# Patient Record
Sex: Female | Born: 1935 | Race: White | Hispanic: No | Smoking: Never smoker
Health system: Southern US, Community
[De-identification: ages and names within clinical notes are randomized; demographics above are authoritative.]

## PROBLEM LIST (undated history)

## (undated) DIAGNOSIS — E039 Hypothyroidism, unspecified: Secondary | ICD-10-CM

## (undated) DIAGNOSIS — Z9889 Other specified postprocedural states: Secondary | ICD-10-CM

## (undated) DIAGNOSIS — I10 Essential (primary) hypertension: Secondary | ICD-10-CM

## (undated) DIAGNOSIS — D649 Anemia, unspecified: Secondary | ICD-10-CM

## (undated) DIAGNOSIS — E079 Disorder of thyroid, unspecified: Secondary | ICD-10-CM

## (undated) DIAGNOSIS — I8390 Asymptomatic varicose veins of unspecified lower extremity: Secondary | ICD-10-CM

## (undated) DIAGNOSIS — R112 Nausea with vomiting, unspecified: Secondary | ICD-10-CM

## (undated) DIAGNOSIS — H269 Unspecified cataract: Secondary | ICD-10-CM

## (undated) HISTORY — DX: Anemia, unspecified: D64.9

## (undated) HISTORY — DX: Asymptomatic varicose veins of unspecified lower extremity: I83.90

## (undated) HISTORY — DX: Essential (primary) hypertension: I10

## (undated) HISTORY — PX: HEMORROIDECTOMY: SUR656

## (undated) HISTORY — DX: Unspecified cataract: H26.9

## (undated) HISTORY — DX: Disorder of thyroid, unspecified: E07.9

---

## 1969-07-19 HISTORY — PX: VARICOSE VEIN SURGERY: SHX832

## 1973-07-19 HISTORY — PX: APPENDECTOMY: SHX54

## 1973-07-19 HISTORY — PX: ABDOMINAL HYSTERECTOMY: SHX81

## 2002-06-15 ENCOUNTER — Encounter: Payer: Self-pay | Admitting: Family Medicine

## 2002-06-15 ENCOUNTER — Encounter: Admission: RE | Admit: 2002-06-15 | Discharge: 2002-06-15 | Payer: Self-pay | Admitting: Family Medicine

## 2004-05-28 ENCOUNTER — Ambulatory Visit: Payer: Self-pay | Admitting: Internal Medicine

## 2005-11-11 ENCOUNTER — Ambulatory Visit: Payer: Self-pay | Admitting: Cardiology

## 2005-11-11 ENCOUNTER — Inpatient Hospital Stay (HOSPITAL_COMMUNITY): Admission: EM | Admit: 2005-11-11 | Discharge: 2005-11-11 | Payer: Self-pay | Admitting: Family Medicine

## 2005-11-11 ENCOUNTER — Ambulatory Visit: Payer: Self-pay | Admitting: Internal Medicine

## 2005-12-06 ENCOUNTER — Ambulatory Visit: Payer: Self-pay | Admitting: Family Medicine

## 2005-12-28 ENCOUNTER — Ambulatory Visit: Payer: Self-pay | Admitting: Family Medicine

## 2007-01-02 ENCOUNTER — Ambulatory Visit: Payer: Self-pay | Admitting: Family Medicine

## 2007-01-02 LAB — CONVERTED CEMR LAB
ALT: 24 units/L (ref 0–40)
AST: 29 units/L (ref 0–37)
Albumin: 4 g/dL (ref 3.5–5.2)
Alkaline Phosphatase: 59 units/L (ref 39–117)
BUN: 11 mg/dL (ref 6–23)
Basophils Absolute: 0 10*3/uL (ref 0.0–0.1)
Basophils Relative: 0.1 % (ref 0.0–1.0)
Bilirubin, Direct: 0.1 mg/dL (ref 0.0–0.3)
CO2: 29 meq/L (ref 19–32)
Calcium: 9.4 mg/dL (ref 8.4–10.5)
Chloride: 110 meq/L (ref 96–112)
Cholesterol: 186 mg/dL (ref 0–200)
Creatinine, Ser: 0.9 mg/dL (ref 0.4–1.2)
Eosinophils Absolute: 0 10*3/uL (ref 0.0–0.6)
Eosinophils Relative: 1.1 % (ref 0.0–5.0)
GFR calc Af Amer: 80 mL/min
GFR calc non Af Amer: 66 mL/min
Glucose, Bld: 94 mg/dL (ref 70–99)
HCT: 36.2 % (ref 36.0–46.0)
HDL: 52.8 mg/dL (ref >39.0)
Hemoglobin: 12.5 g/dL (ref 12.0–15.0)
LDL Cholesterol: 114 mg/dL — ABNORMAL HIGH (ref 0–99)
Lymphocytes Relative: 39.4 % (ref 12.0–46.0)
MCHC: 34.7 g/dL (ref 30.0–36.0)
MCV: 89.5 fL (ref 78.0–100.0)
Monocytes Absolute: 0.5 10*3/uL (ref 0.2–0.7)
Monocytes Relative: 11.4 % — ABNORMAL HIGH (ref 3.0–11.0)
Neutro Abs: 2.1 10*3/uL (ref 1.4–7.7)
Neutrophils Relative %: 48 % (ref 43.0–77.0)
Platelets: 172 10*3/uL (ref 150–400)
Potassium: 4.1 meq/L (ref 3.5–5.1)
RBC: 4.04 M/uL (ref 3.87–5.11)
RDW: 12.4 % (ref 11.5–14.6)
Sodium: 142 meq/L (ref 135–145)
TSH: 8.39 microintl units/mL — ABNORMAL HIGH (ref 0.35–5.50)
Total Bilirubin: 0.8 mg/dL (ref 0.3–1.2)
Total CHOL/HDL Ratio: 3.5
Total Protein: 8.1 g/dL (ref 6.0–8.3)
Triglycerides: 97 mg/dL (ref 0–149)
VLDL: 19 mg/dL (ref 0–40)
WBC: 4.3 10*3/uL — ABNORMAL LOW (ref 4.5–10.5)

## 2007-02-14 ENCOUNTER — Ambulatory Visit: Payer: Self-pay | Admitting: Family Medicine

## 2007-02-16 DIAGNOSIS — E039 Hypothyroidism, unspecified: Secondary | ICD-10-CM | POA: Insufficient documentation

## 2007-02-16 LAB — CONVERTED CEMR LAB: TSH: 1.3 microintl units/mL (ref 0.35–5.50)

## 2007-03-28 DIAGNOSIS — M8580 Other specified disorders of bone density and structure, unspecified site: Secondary | ICD-10-CM | POA: Insufficient documentation

## 2008-01-02 ENCOUNTER — Encounter: Payer: Self-pay | Admitting: Family Medicine

## 2008-01-03 ENCOUNTER — Ambulatory Visit: Payer: Self-pay | Admitting: Family Medicine

## 2008-01-03 DIAGNOSIS — I10 Essential (primary) hypertension: Secondary | ICD-10-CM | POA: Insufficient documentation

## 2008-01-03 LAB — CONVERTED CEMR LAB
ALT: 17 units/L (ref 0–35)
AST: 19 units/L (ref 0–37)
Albumin: 4 g/dL (ref 3.5–5.2)
Alkaline Phosphatase: 56 units/L (ref 39–117)
BUN: 10 mg/dL (ref 6–23)
Basophils Relative: 0 % (ref 0.0–1.0)
CO2: 28 meq/L (ref 19–32)
Chloride: 109 meq/L (ref 96–112)
Creatinine, Ser: 1 mg/dL (ref 0.4–1.2)
Eosinophils Absolute: 0.1 10*3/uL (ref 0.0–0.7)
Eosinophils Relative: 1.2 % (ref 0.0–5.0)
GFR calc non Af Amer: 58 mL/min
Glucose, Bld: 91 mg/dL (ref 70–99)
MCV: 91.5 fL (ref 78.0–100.0)
Monocytes Relative: 10.9 % (ref 3.0–12.0)
Neutrophils Relative %: 47.9 % (ref 43.0–77.0)
Platelets: 161 10*3/uL (ref 150–400)
Potassium: 4.3 meq/L (ref 3.5–5.1)
RBC: 3.85 M/uL — ABNORMAL LOW (ref 3.87–5.11)
TSH: 4.73 microintl units/mL (ref 0.35–5.50)
Total CHOL/HDL Ratio: 3.6
Total Protein: 7.9 g/dL (ref 6.0–8.3)
VLDL: 22 mg/dL (ref 0–40)
WBC: 4.4 10*3/uL — ABNORMAL LOW (ref 4.5–10.5)

## 2008-01-05 ENCOUNTER — Emergency Department (HOSPITAL_COMMUNITY): Admission: EM | Admit: 2008-01-05 | Discharge: 2008-01-05 | Payer: Self-pay | Admitting: Family Medicine

## 2008-01-05 ENCOUNTER — Telehealth: Payer: Self-pay | Admitting: *Deleted

## 2008-04-26 ENCOUNTER — Ambulatory Visit: Payer: Self-pay | Admitting: Family Medicine

## 2008-05-15 ENCOUNTER — Emergency Department (HOSPITAL_COMMUNITY): Admission: EM | Admit: 2008-05-15 | Discharge: 2008-05-15 | Payer: Self-pay | Admitting: Family Medicine

## 2009-01-09 ENCOUNTER — Ambulatory Visit: Payer: Self-pay | Admitting: Family Medicine

## 2009-01-14 ENCOUNTER — Ambulatory Visit: Payer: Self-pay | Admitting: Family Medicine

## 2009-01-14 LAB — CONVERTED CEMR LAB
Beta Globulin: 4.7 % (ref 4.7–7.2)
Gamma Globulin: 27 % — ABNORMAL HIGH (ref 11.1–18.8)

## 2009-05-07 ENCOUNTER — Encounter (INDEPENDENT_AMBULATORY_CARE_PROVIDER_SITE_OTHER): Payer: Self-pay | Admitting: *Deleted

## 2009-07-28 ENCOUNTER — Telehealth: Payer: Self-pay | Admitting: Family Medicine

## 2009-11-24 ENCOUNTER — Telehealth: Payer: Self-pay | Admitting: Family Medicine

## 2010-02-17 ENCOUNTER — Telehealth: Payer: Self-pay | Admitting: Family Medicine

## 2010-03-02 ENCOUNTER — Ambulatory Visit: Payer: Self-pay | Admitting: Family Medicine

## 2010-05-21 LAB — HM MAMMOGRAPHY: HM Mammogram: NORMAL

## 2010-05-22 ENCOUNTER — Encounter: Payer: Self-pay | Admitting: Family Medicine

## 2010-08-16 LAB — CONVERTED CEMR LAB
ALT: 18 units/L (ref 0–35)
ALT: 19 units/L (ref 0–35)
Albumin: 4.2 g/dL (ref 3.5–5.2)
Alkaline Phosphatase: 60 units/L (ref 39–117)
BUN: 10 mg/dL (ref 6–23)
Basophils Relative: 0.1 % (ref 0.0–3.0)
Basophils Relative: 0.4 % (ref 0.0–3.0)
Bilirubin Urine: NEGATIVE
Bilirubin, Direct: 0.2 mg/dL (ref 0.0–0.3)
CO2: 25 meq/L (ref 19–32)
CO2: 26 meq/L (ref 19–32)
Calcium: 9.4 mg/dL (ref 8.4–10.5)
Chloride: 100 meq/L (ref 96–112)
Chloride: 101 meq/L (ref 96–112)
Cholesterol: 165 mg/dL (ref 0–200)
Creatinine, Ser: 0.9 mg/dL (ref 0.4–1.2)
Eosinophils Relative: 0.7 % (ref 0.0–5.0)
Eosinophils Relative: 2.2 % (ref 0.0–5.0)
Glucose, Bld: 88 mg/dL (ref 70–99)
Glucose, Urine, Semiquant: NEGATIVE
HCT: 36 % (ref 36.0–46.0)
Hemoglobin: 12.3 g/dL (ref 12.0–15.0)
Ketones, urine, test strip: NEGATIVE
LDL Cholesterol: 90 mg/dL (ref 0–99)
LDL Cholesterol: 97 mg/dL (ref 0–99)
Lymphocytes Relative: 35.3 % (ref 12.0–46.0)
Lymphs Abs: 1.5 10*3/uL (ref 0.7–4.0)
MCHC: 34.8 g/dL (ref 30.0–36.0)
MCV: 89.1 fL (ref 78.0–100.0)
MCV: 91.1 fL (ref 78.0–100.0)
Monocytes Absolute: 0.4 10*3/uL (ref 0.1–1.0)
Neutro Abs: 2.6 10*3/uL (ref 1.4–7.7)
Neutrophils Relative %: 54.2 % (ref 43.0–77.0)
Nitrite: POSITIVE
Platelets: 180 10*3/uL (ref 150.0–400.0)
Potassium: 4.1 meq/L (ref 3.5–5.1)
RBC: 3.87 M/uL (ref 3.87–5.11)
Sodium: 136 meq/L (ref 135–145)
Specific Gravity, Urine: <1.005
Specific Gravity, Urine: <1.005
TSH: 3.5 microintl units/mL (ref 0.35–5.50)
Total Bilirubin: 0.9 mg/dL (ref 0.3–1.2)
Total CHOL/HDL Ratio: 3
Total Protein: 7.9 g/dL (ref 6.0–8.3)
Total Protein: 8.7 g/dL — ABNORMAL HIGH (ref 6.0–8.3)
Triglycerides: 83 mg/dL (ref 0.0–149.0)
Urobilinogen, UA: 0.2
WBC: 4.2 10*3/uL — ABNORMAL LOW (ref 4.5–10.5)
WBC: 4.8 10*3/uL (ref 4.5–10.5)
pH: 5

## 2010-08-18 NOTE — Progress Notes (Signed)
Summary: REFILL REQUEST  Phone Note Refill Request Message from:  Patient on February 17, 2010 8:55 AM  Refills Requested: Medication #1:  SYNTHROID 50 MCG  TABS Take 1 tablet by mouth every morning   Notes: [facility] [address], [place], [place]    Initial call taken by: Debbra Riding,  February 17, 2010 8:55 AM    Prescriptions: SYNTHROID 50 MCG  TABS (LEVOTHYROXINE SODIUM) Take 1 tablet by mouth every morning  #30 x 0   Entered by:   Kern Reap CMA (AAMA)   Authorized by:   Roderick Pee MD   Signed by:   Kern Reap CMA (AAMA) on 02/17/2010   Method used:   Electronically to        [facility] [phone]* (retail)       [address]       [place], [place]  [address]       Ph: [phone]       Fax: [phone]   RxID:   [id number]

## 2010-08-18 NOTE — Progress Notes (Signed)
Summary: pt is req cream call into pharm  Phone Note Call from Patient Call back at Home Phone [phone]   Caller: Patient Call For: Roderick Pee MD Summary of Call: Pt is requesting vaginal cream for vaginal dryness [facility] [phone]. Initial call taken by: Heron Sabins,  July 28, 2009 10:40 AM  Follow-up for Phone Call        Premarin vaginal cream dispensed.  Three tubes directions apply twice weekly, refills x 6........... no applicator Follow-up by: Roderick Pee MD,  July 28, 2009 10:59 AM    New/Updated Medications: PREMARIN 0.625 MG/GM CREA (ESTROGENS, CONJUGATED) apply 0.5mg  2 times a week Prescriptions: PREMARIN 0.625 MG/GM CREA (ESTROGENS, CONJUGATED) apply 0.5mg  2 times a week  #3 x 6   Entered by:   Kern Reap CMA (AAMA)   Authorized by:   Roderick Pee MD   Signed by:   Kern Reap CMA (AAMA) on 07/28/2009   Method used:   Electronically to        [facility] [phone]* (retail)       [address]       [place], [place]  [address]       Ph: [phone]       Fax: [phone]   RxID:   [id number]

## 2010-08-18 NOTE — Progress Notes (Signed)
Summary: synthroid refill  Phone Note Refill Request Message from:  Fax from Pharmacy on Nov 24, 2009 5:11 PM  Refills Requested: Medication #1:  SYNTHROID 50 MCG  TABS Take 1 tablet by mouth every morning Initial call taken by: Kern Reap CMA Duncan Dull),  Nov 24, 2009 5:11 PM    Prescriptions: SYNTHROID 50 MCG  TABS (LEVOTHYROXINE SODIUM) Take 1 tablet by mouth every morning  #100 Tablet x 0   Entered by:   Kern Reap CMA (AAMA)   Authorized by:   Roderick Pee MD   Signed by:   Kern Reap CMA (AAMA) on 11/24/2009   Method used:   Electronically to        [facility] [phone]* (retail)       [address]       [place], [place]  [address]       Ph: [phone]       Fax: [phone]   RxID:   [id number]

## 2010-08-18 NOTE — Miscellaneous (Signed)
Summary: mammogram update   Clinical Lists Changes  Observations: Added new observation of MAMMO DUE: 05/2011 (05/22/2010 9:54) Added new observation of MAMMOGRAM: normal (05/21/2010 9:54)      Preventive Care Screening  Mammogram:    Date:  05/21/2010    Next Due:  05/2011    Results:  normal

## 2010-08-18 NOTE — Assessment & Plan Note (Signed)
Summary: CPX // RS   Vital Signs:  Patient profile:   75 year old female Menstrual status:  hysterectomy Height:      67.5 inches Weight:      164 pounds BMI:     25.40 Temp:     97.8 degrees F oral Pulse rate:   68 / minute BP sitting:   138 / 90  (left arm) Cuff size:   regular  Vitals Entered By: Kern Reap CMA Duncan Dull) (March 02, 2010 9:36 AM) CC: annual check up Is Patient Diabetic? No Pain Assessment Patient in pain? no        CC:  annual check up.  History of Present Illness: Anisah is a 75 year old, married female, nonsmoker, who comes in today for physical evaluation because of underlying osteoporosis, hypertension, and hypothyroidism.  She's been on Fosamax for more than 3 years.  We will take her off the Fosamax and get a bone density in two years.  She takes Synthroid 50 micrograms daily.  Will check thyroid level today.  She also takes Zestril 10 mg daily and an 81-mg baby aspirin.  BP at home 120/80.  She gets routine eye care, dental care, BSE monthly, annual mammography, colonoscopy, 2005, normal, tetanus, 2009, seasonal flu 2010, Pneumovax 2007, shingles 2009. Here for Medicare AWV:  1.   Risk factors based on Past M, S, F history:......reviewed no change 2.   Physical Activities: walks daily 3.   Depression/mood: mood good.  No depression 4.   Hearing: normal 5.   ADL's: normal 6.   Fall Risk: reviewed 7.   Home Safety: reviewed 8.   Height, weight, &visual acuity:height weight, unchanged.  Annual eye exam by ophthalmologist.  Normal 9.   Counseling: ........continue current medication and exercise program 10.   Labs ordered based on risk factors: ...done today 11.           Referral Coordination...Marland KitchenMarland KitchenMarland Kitchennone indicated 12.           Care Plan.........Marland Kitchenreviewed in detail 13.            Cognitive Assessment ........normal mentation  Allergies: 1)  ! Septra 2)  Codeine 3)  Sulfamethoxazole (Sulfamethoxazole)  Past History:  Past medical,  surgical, family and social histories (including risk factors) reviewed, and no changes noted (except as noted below).  Past Medical History: Reviewed history from 01/03/2008 and no changes required. Osteoporosis Varicose Veins Hypertension Hypothyroidism  Past Surgical History: Reviewed history from 03/28/2007 and no changes required. CB x1 Colonoscopy  Family History: Reviewed history from 03/28/2007 and no changes required. Family History Diabetes 1st degree relative Family History Other cancer - Bone Family History of Stroke M 1st degree relative <50 Family History Breast cancer 1st degree relative <50  Social History: Reviewed history from 03/28/2007 and no changes required. Retired Married Never Smoked Alcohol use-no Drug use-no Regular exercise-no  Review of Systems      See HPI  Physical Exam  General:  Well-developed,well-nourished,in no acute distress; alert,appropriate and cooperative throughout examination Head:  Normocephalic and atraumatic without obvious abnormalities. No apparent alopecia or balding. Eyes:  No corneal or conjunctival inflammation noted. EOMI. Perrla. Funduscopic exam benign, without hemorrhages, exudates or papilledema. Vision grossly normal. Ears:  External ear exam shows no significant lesions or deformities.  Otoscopic examination reveals clear canals, tympanic membranes are intact bilaterally without bulging, retraction, inflammation or discharge. Hearing is grossly normal bilaterally. Nose:  External nasal examination shows no deformity or inflammation. Nasal mucosa are pink and moist without  lesions or exudates. Mouth:  Oral mucosa and oropharynx without lesions or exudates.  Teeth in good repair. Neck:  No deformities, masses, or tenderness noted. Chest Wall:  No deformities, masses, or tenderness noted. Breasts:  No mass, nodules, thickening, tenderness, bulging, retraction, inflamation, nipple discharge or skin changes noted.     Lungs:  Normal respiratory effort, chest expands symmetrically. Lungs are clear to auscultation, no crackles or wheezes. Heart:  Normal rate and regular rhythm. S1 and S2 normal without gallop, murmur, click, rub or other extra sounds. Abdomen:  Bowel sounds positive,abdomen soft and non-tender without masses, organomegaly or hernias noted. Rectal:  No external abnormalities noted. Normal sphincter tone. No rectal masses or tenderness. Genitalia:  Pelvic Exam:        External: normal female genitalia without lesions or masses        Vagina: normal without lesions or masses        Cervix: normal without lesions or masses        Adnexa: normal bimanual exam without masses or fullness        Uterus: normal by palpation        Pap smear: performed Msk:  No deformity or scoliosis noted of thoracic or lumbar spine.   Pulses:  R and L carotid,radial,femoral,dorsalis pedis and posterior tibial pulses are full and equal bilaterally Extremities:  No clubbing, cyanosis, edema, or deformity noted with normal full range of motion of all joints.   Neurologic:  No cranial nerve deficits noted. Station and gait are normal. Plantar reflexes are down-going bilaterally. DTRs are symmetrical throughout. Sensory, motor and coordinative functions appear intact. Skin:  Intact without suspicious lesions or rashes Cervical Nodes:  No lymphadenopathy noted Axillary Nodes:  No palpable lymphadenopathy Inguinal Nodes:  No significant adenopathy Psych:  Cognition and judgment appear intact. Alert and cooperative with normal attention span and concentration. No apparent delusions, illusions, hallucinations   Impression & Recommendations:  Problem # 1:  HYPERTENSION (ICD-401.9) Assessment Improved  Her updated medication list for this problem includes:    Zestril 10 Mg Tabs (Lisinopril) .Marland Kitchen... Take 1 tablet by mouth every morning  Orders: Venipuncture ([address]109) Prescription Created Electronically [phone]) Medicare  -1st Annual Wellness Visit [phone]) Urinalysis-dipstick only (Medicare patient) (91478GN) EKG w/ Interpretation (93000) TLB-Lipid Panel (80061-LIPID) TLB-BMP (Basic Metabolic Panel-BMET) (80048-METABOL) TLB-CBC Platelet - w/Differential (85025-CBCD) TLB-Hepatic/Liver Function Pnl (80076-HEPATIC) TLB-TSH (Thyroid Stimulating Hormone) (84443-TSH)  Problem # 2:  OSTEOPOROSIS (ICD-733.00) Assessment: Improved  The following medications were removed from the medication list:    Fosamax 70 Mg Tabs (Alendronate sodium) .Marland Kitchen... 1 weekly  Orders: Venipuncture (56213) Prescription Created Electronically [phone]) Medicare -1st Annual Wellness Visit [phone]) Urinalysis-dipstick only (Medicare patient) (29528UX) TLB-Lipid Panel (80061-LIPID) TLB-BMP (Basic Metabolic Panel-BMET) (80048-METABOL) TLB-CBC Platelet - w/Differential (85025-CBCD) TLB-Hepatic/Liver Function Pnl (80076-HEPATIC) TLB-TSH (Thyroid Stimulating Hormone) (84443-TSH)  Problem # 3:  HYPOTHYROIDISM (ICD-244.9) Assessment: Improved  Her updated medication list for this problem includes:    Synthroid 50 Mcg Tabs (Levothyroxine sodium) .Marland Kitchen... Take 1 tablet by mouth every morning  Orders: Venipuncture (32440) Prescription Created Electronically [phone]) Medicare -1st Annual Wellness Visit [phone]) Urinalysis-dipstick only (Medicare patient) (40347QQ) EKG w/ Interpretation (93000) TLB-Lipid Panel (80061-LIPID) TLB-BMP (Basic Metabolic Panel-BMET) (80048-METABOL) TLB-CBC Platelet - w/Differential (85025-CBCD) TLB-Hepatic/Liver Function Pnl (80076-HEPATIC) TLB-TSH (Thyroid Stimulating Hormone) (84443-TSH)  Problem # 4:  Preventive Health Care (ICD-V70.0) Assessment: Unchanged  Complete Medication List: 1)  Zestril 10 Mg Tabs (Lisinopril) .... Take 1 tablet by mouth every morning 2)  Synthroid 50 Mcg Tabs (Levothyroxine  sodium) .... Take 1 tablet by mouth every morning 3)  Aspir-low 81 Mg Tbec (Aspirin) .... Once daily  Other  Orders: Specimen Handling (19147)  Patient Instructions: 1)  Please schedule a follow-up appointment in 1 year. 2)  It is important that you exercise regularly at least 20 minutes 5 times a week. If you develop chest pain, have severe difficulty breathing, or feel very tired , stop exercising immediately and seek medical attention. 3)  Schedule your mammogram. 4)  Schedule a colonoscopy/sigmoidoscopy to help detect colon cancer. 5)  Take calcium +Vitamin D daily. 6)  Take an Aspirin every day. Prescriptions: SYNTHROID 50 MCG  TABS (LEVOTHYROXINE SODIUM) Take 1 tablet by mouth every morning  #100 x 3   Entered and Authorized by:   Roderick Pee MD   Signed by:   Roderick Pee MD on 03/02/2010   Method used:   Electronically to        [facility] [phone]* (retail)       [address]       [place], [place]  [address]       Ph: [phone]       Fax: [phone]   RxID:   [id number] SYNTHROID 50 MCG  TABS (LEVOTHYROXINE SODIUM) Take 1 tablet by mouth every morning  #100 x 3   Entered and Authorized by:   Roderick Pee MD   Signed by:   Roderick Pee MD on 03/02/2010   Method used:   Electronically to        [facility] [phone]* (retail)       [address]       [place], [place]  [address]       Ph: [phone]       Fax: [phone]   RxID:   [id number] ZESTRIL 10 MG  TABS (LISINOPRIL) Take 1 tablet by mouth every morning  #100 Tablet x 3   Entered and Authorized by:   Roderick Pee MD   Signed by:   Roderick Pee MD on 03/02/2010   Method used:   Electronically to        [facility] [phone]* (retail)       [address]       [place], [place]  [address]       Ph: [phone]       Fax: [phone]   RxID:   [id number]    Immunization History:  Influenza Immunization History:    Influenza:  historical (04/18/2009)   Laboratory Results   Urine Tests  Date/Time Received: March 02, 2010   Routine Urinalysis     Color: yellow Appearance: Clear Glucose: negative   (Normal Range: Negative) Bilirubin: negative   (Normal Range: Negative) Ketone: negative   (Normal Range: Negative) Spec. Gravity: <1.005   (Normal Range: 1.003-1.035) Blood: negative   (Normal Range: Negative) pH: 5.0   (Normal Range: 5.0-8.0) Protein: negative   (Normal Range: Negative) Urobilinogen: 0.2   (Normal Range: 0-1) Nitrite: negative   (Normal Range: Negative) Leukocyte Esterace: 2+   (Normal Range: Negative)    Comments: Kathrynn Speed CMA  March 02, 2010 1:14 PM

## 2010-12-04 NOTE — Discharge Summary (Signed)
NAMENIKO, JAKEL NO.:  [id number]   MEDICAL RECORD NO.:  [id number]          PATIENT TYPE:  INP   LOCATION:  5506                         FACILITY:  [facility]   PHYSICIAN:  Rene Paci, M.D. LHCDATE OF BIRTH:  March 13, 1936   DATE OF ADMISSION:  11/10/2005  DATE OF DISCHARGE:  11/11/2005                                 DISCHARGE SUMMARY   DISCHARGE DIAGNOSES:  1.  Chest pain, atypical.  2.  Elevated TSH.  3.  Hypertension.   HISTORY OF PRESENT ILLNESS:  The patient is a 75 year old white female who  was admitted with chief complaint of indigestion.  The patient was admitted  for further evaluation and treatment.   PAST MEDICAL HISTORY:  1.  Hypertension.  2.  History of shingles.   COURSE OF HOSPITALIZATION:  #1 - CHEST PAIN, ATYPICAL.  The patient was  admitted and underwent serial cardiac enzymes which were negative.  Lipid  profile performed which showed a total cholesterol of 167 and LDL of 95.  The patient underwent a stress test which was performed on November 11, 2005,  which per verbal report from cardiology was negative for ischemia and showed  normal LV function.  The patient did have an episode of six beats of  ventricular tachycardia during this admission which was asymptomatic.  Otherwise, she has maintained normal sinus rhythm in 60s to 70s on  telemetry.   #2 - HYPERTENSION.  The patient was placed on an ACE inhibitor and her blood  pressure has improved.  This will be continued at time of discharge and will  need followup as an outpatient.   #3 - ELEVATED TSH.  The patient was noted to have TSH with a value of 6.833.  She has no known history of hypothyroidism.  She will need further  evaluation of thyroid function as an outpatient and may need initiation of  Synthroid.  Will defer to the patient's primary care.   MEDICATIONS AT DISCHARGE:  1.  Fosamax once weekly as before.  2.  Tums four times daily.  3.  Altace 5 mg p.o. daily.  4.   Calcium 500 mg p.o. daily.   PERTINENT LABORATORY DATA:  Cardiac enzymes negative.  BUN 6, creatinine  0.8.  Hemoglobin 12.9, hematocrit 36.9.   FOLLOWUP:  The patient was instructed to follow up with Dr. Kelle Darting in  1-2 weeks and call for an appointment.  She is also instructed to call Dr.  Tawanna Cooler should she develop fever over 101 or worsening chest pain, or go  directly to the emergency room.      Melissa S. Peggyann Juba, NP      Rene Paci, M.D. [facility]  Electronically Signed    MSO/MEDQ  D:  11/11/2005  T:  11/12/2005  Job:  [id number]   cc:   Tinnie Gens A. Tawanna Cooler, M.D. [facility]  [address] [place]  [place] [address]

## 2010-12-04 NOTE — H&P (Signed)
NAMEYING, BLANKENHORN NO.:  [id number]   MEDICAL RECORD NO.:  [id number]          PATIENT TYPE:  INP   LOCATION:  5506                         FACILITY:  [facility]   PHYSICIAN:  Lorain Childes, M.D. LHCDATE OF BIRTH:  October 09, 1935   DATE OF ADMISSION:  11/10/2005  DATE OF DISCHARGE:                                HISTORY & PHYSICAL   PRIMARY CARE PHYSICIAN:  Tinnie Gens A. Tawanna Cooler, M.D.   CHIEF COMPLAINT:  Indigestion.   HISTORY OF PRESENT ILLNESS:  The patient is a 75 year old female with a  history of hypertension, who was admitted through the ER from urgent care  with complaints of indigestion.  The patient reports around 11 a.m. she had  indigestion and she felt a little dizzy.  She ate tomato soup and she had no  change in her symptoms.  She then felt gaseous and needed to burp to relieve  this discomfort.  She burped several times and it improved.  Her discomfort  was rated a 1-2/10 initially and then resolved.  She took an Radio broadcast assistant, which helped her symptoms.  She reports she has never had  problems like this previously.  She does not have reflux disease that is  known.  She does state that during her pain and following it, she did not  have any exertional component of her pain.  She does report mild nausea, no  vomiting, no diaphoresis.  She denies any shortness of breath.  Since this  morning she has had no recurrence of her pain.  She does report that she has  had some lightheadedness today noted mostly when she changed positions.  When going from lying to standing, she felt a little lightheadedness.  It  took her a few minutes to feel better.  She denies any syncopal events.  She  had no falls.   PAST MEDICAL HISTORY:  1.  Hypertension.  2.  History of shingles.   MEDICATIONS:  1.  Fosamax one tablet weekly.  2.  Calcium.  3.  Tums p.r.n.   ALLERGIES:  CODEINE.   SOCIAL HISTORY:  She lives in [place] with her husband.  She denies  any  tobacco, no alcohol, no drugs.   FAMILY HISTORY:  Mother died from cancer.  Father died from diabetes and its  complications.  She has one brother who has Alzheimer's disease and two  sisters, one with hypertension and one with breast cancer.   REVIEW OF SYSTEMS:  She denies any fevers, chills, no headache or visual  changes.  No shortness of breath, dyspnea on exertion, orthopnea, no PND, no  lower extremity edema.  She denies any palpitations, no frank syncope, no  wheezing.  She denies any urinary symptoms of hematuria, no dysuria.  She  denies any bright red blood per rectum, no melena.  She denies abdominal  pain and change in her bowel habits.  All other systems are negative.   PHYSICAL EXAMINATION:  VITAL SIGNS:  Temperature 98.1, pulse 69,  respirations 12, blood pressure 194/100.  Saturations were 96% on room air.  GENERAL:  She is a very pleasant female lying in bed comfortable, in no  acute distress.  HEENT:  Normocephalic, atraumatic.  NECK:  JVP is flat.  She has no bruits.  She has 2+ carotid upstroke.  CARDIOVASCULAR:  Normal S1, split S2, regular rate and rhythm.  She has an  S4.  Her PMI is nondisplaced.  She has a soft systolic murmur noted.  Her  pulses are 2+ throughout.  She has no bruits.  LUNGS:  Clear to auscultation bilaterally.  ABDOMEN:  Soft, positive bowel sounds, nontender, no organomegaly.  EXTREMITIES:  No edema.  NEUROLOGIC:  Nonfocal.   Chest x-ray shows no disease.  EKG:  Rate 66, normal sinus rhythm, normal  axis.  PR interval is 142 msec, QRS is 102 msec, QTC is 454.  She has no  ischemic changes, no hypertrophy.   LABORATORY DATA:  Hematocrit is 36.7, white count 4.9, platelets 181.  Potassium 3.6, creatinine 0.7 and glucose 93.  CK-MB of less than 1.  Repeat  is less than 1.  Troponin is less than 0.05, repeat is less than 0.05.  Myoglobin is 61.3, repeat is 60.2.   ASSESSMENT AND PLAN:  The patient is a 75 year old female with no  prior  cardiac history, who presents with atypical chest pain/epigastric  discomfort, also with hypertension.   1.  Chest pain.  Complaints are atypical.  They sound more like reflux      disease; however, with her age and hypertension, we will evaluate her      for cardiac cause also.  Will place her on telemetry and cycle cardiac      enzymes and follow EKG.  We will check her lipids.  Will continue on      aspirin.  We will start an ACE inhibitor for her blood pressure for now.      As I am planning to do a stress test in the morning, avoid a beta      blocker currently.  For her possible reflux disease, start her on a      proton pump inhibitor tonight.  2.  Lightheadedness.  Will check orthostatics and monitor this.  She does      appear mildly hypovolemic.  I am giving her some normal saline.  3.  Hypertension.  I have started her on an ACE inhibitor for her blood      pressure.  I am avoiding a diuretic, as she appears hypovolemic      currently.  Will monitor blood pressure and adjust her medication as      needed.           ______________________________  Lorain Childes, M.D. [facility]     CGF/MEDQ  D:  11/11/2005  T:  11/11/2005  Job:  [id number]

## 2010-12-24 ENCOUNTER — Other Ambulatory Visit: Payer: Self-pay | Admitting: Family Medicine

## 2011-04-09 ENCOUNTER — Other Ambulatory Visit: Payer: Self-pay | Admitting: Family Medicine

## 2011-04-14 ENCOUNTER — Encounter: Payer: Self-pay | Admitting: Family Medicine

## 2011-04-15 ENCOUNTER — Ambulatory Visit (INDEPENDENT_AMBULATORY_CARE_PROVIDER_SITE_OTHER): Payer: Medicare Other | Admitting: Family Medicine

## 2011-04-15 ENCOUNTER — Encounter: Payer: Self-pay | Admitting: Family Medicine

## 2011-04-15 VITALS — BP 130/84 | Temp 97.8°F | Ht 67.5 in | Wt 170.0 lb

## 2011-04-15 DIAGNOSIS — Z0000 Encounter for general adult medical examination without abnormal findings: Secondary | ICD-10-CM

## 2011-04-15 DIAGNOSIS — I10 Essential (primary) hypertension: Secondary | ICD-10-CM

## 2011-04-15 DIAGNOSIS — E039 Hypothyroidism, unspecified: Secondary | ICD-10-CM

## 2011-04-15 LAB — POCT URINALYSIS DIPSTICK
Bilirubin, UA: NEGATIVE
Ketones, UA: NEGATIVE
Nitrite, UA: NEGATIVE
Protein, UA: NEGATIVE
pH, UA: 5.5

## 2011-04-15 LAB — CBC WITH DIFFERENTIAL/PLATELET
Basophils Relative: 0.3 % (ref 0.0–3.0)
Eosinophils Relative: 2.3 % (ref 0.0–5.0)
HCT: 37.8 % (ref 36.0–46.0)
Hemoglobin: 12.7 g/dL (ref 12.0–15.0)
Lymphs Abs: 1.5 10*3/uL (ref 0.7–4.0)
MCV: 92 fl (ref 78.0–100.0)
Monocytes Absolute: 0.4 10*3/uL (ref 0.1–1.0)
Monocytes Relative: 8.3 % (ref 3.0–12.0)
Platelets: 175 10*3/uL (ref 150.0–400.0)
RBC: 4.11 Mil/uL (ref 3.87–5.11)
WBC: 4.5 10*3/uL (ref 4.5–10.5)

## 2011-04-15 MED ORDER — LEVOTHYROXINE SODIUM 50 MCG PO TABS: 50.0000 ug | ORAL_TABLET | Freq: Every day | ORAL | Status: DC

## 2011-04-15 MED ORDER — LISINOPRIL 10 MG PO TABS: 10.0000 mg | ORAL_TABLET | Freq: Every day | ORAL | Status: DC

## 2011-04-15 NOTE — Progress Notes (Signed)
  Subjective:    Patient ID: Christy Wade, female    DOB: 1936-01-25, 75 y.o.   MRN: [id number]  HPIlula Is a 75 year old, married female, nonsmoker, who comes in today for her Medicare wellness examination because of a history of hypothyroidism and hypertension.  She takes Synthroid 50 mcg daily for hypothyroidism.  She takes lisinopril 10 g daily for hypertension.  BP 130/84.  She gets routine eye care, hearing normal, where her dental care, BSE monthly, and you mammography, colonoscopy, normal, tetanus, 2009, Pneumovax 2007, shingles 2009, seasonal flu shot today.  Activities of daily living.  Normal.  She exercises intense her garden on a regular basis.  She also plays the piano at church.  Cognitive function, normal, home health safety reviewed.  No issues identified, they do have guns in the house, and then not locked.  They do have a healthcare power of attorney and living will.    Review of Systems  Constitutional: Negative.   HENT: Negative.   Eyes: Negative.   Respiratory: Negative.   Cardiovascular: Negative.   Gastrointestinal: Negative.   Genitourinary: Negative.   Musculoskeletal: Negative.   Neurological: Negative.   Hematological: Negative.   Psychiatric/Behavioral: Negative.        Objective:   Physical Exam  Constitutional: She appears well-developed and well-nourished.  HENT:  Head: Normocephalic and atraumatic.  Right Ear: External ear normal.  Left Ear: External ear normal.  Nose: Nose normal.  Mouth/Throat: Oropharynx is clear and moist.  Eyes: EOM are normal. Pupils are equal, round, and reactive to light.  Neck: Normal range of motion. Neck supple. No thyromegaly present.  Cardiovascular: Normal rate, regular rhythm, normal heart sounds and intact distal pulses.  Exam reveals no gallop and no friction rub.   No murmur heard. Pulmonary/Chest: Effort normal and breath sounds normal.  Abdominal: Soft. Bowel sounds are normal. She exhibits no  distension and no mass. There is no tenderness. There is no rebound.  Genitourinary:       Uterus removed years ago for nonmalignant reasons.  Ovaries were left intact.  Asymptomatic therefore pelvic examination not done  Musculoskeletal: Normal range of motion.  Lymphadenopathy:    She has no cervical adenopathy.  Neurological: She is alert. She has normal reflexes. No cranial nerve deficit. She exhibits normal muscle tone. Coordination normal.  Skin: Skin is warm and dry.  Psychiatric: She has a normal mood and affect. Her behavior is normal. Judgment and thought content normal.          Assessment & Plan:  Healthy female.  Hypothyroidism continue Synthroid 50 mcg daily check levels.  Hypertension.  Continue lisinopril 10 mg daily and a baby aspirin.  Check labs.  Follow-up in one year, sooner if any problems

## 2011-04-15 NOTE — Patient Instructions (Signed)
Continue your current medications.  Follow-up in one year, sooner if any problems or

## 2011-04-16 LAB — HEPATIC FUNCTION PANEL
ALT: 17 U/L (ref 0–35)
AST: 24 U/L (ref 0–37)
Total Bilirubin: 0.7 mg/dL (ref 0.3–1.2)
Total Protein: 8.5 g/dL — ABNORMAL HIGH (ref 6.0–8.3)

## 2011-04-16 LAB — BASIC METABOLIC PANEL
BUN: 18 mg/dL (ref 6–23)
Chloride: 103 mEq/L (ref 96–112)
GFR: 57.8 mL/min — ABNORMAL LOW (ref >60.00)
Potassium: 4.4 mEq/L (ref 3.5–5.1)
Sodium: 137 mEq/L (ref 135–145)

## 2011-04-16 LAB — LIPASE: Lipase: 33 U/L (ref 11.0–59.0)

## 2011-04-18 LAB — VITAMIN D 1,25 DIHYDROXY
Vitamin D2 1, 25 (OH)2: <8 pg/mL
Vitamin D3 1, 25 (OH)2: 36 pg/mL

## 2011-06-28 ENCOUNTER — Telehealth: Payer: Self-pay | Admitting: Family Medicine

## 2011-06-28 NOTE — Telephone Encounter (Signed)
Opened in error

## 2011-07-02 ENCOUNTER — Encounter: Payer: Self-pay | Admitting: Family Medicine

## 2011-10-05 ENCOUNTER — Ambulatory Visit (INDEPENDENT_AMBULATORY_CARE_PROVIDER_SITE_OTHER): Payer: Medicare Other | Admitting: Family

## 2011-10-05 ENCOUNTER — Encounter: Payer: Self-pay | Admitting: Family

## 2011-10-05 VITALS — BP 136/80 | HR 102 | Temp 98.0°F | Wt 159.0 lb

## 2011-10-05 DIAGNOSIS — R05 Cough: Secondary | ICD-10-CM

## 2011-10-05 DIAGNOSIS — J209 Acute bronchitis, unspecified: Secondary | ICD-10-CM

## 2011-10-05 DIAGNOSIS — R059 Cough, unspecified: Secondary | ICD-10-CM

## 2011-10-05 MED ORDER — PREDNISONE 20 MG PO TABS: 40.0000 mg | ORAL_TABLET | Freq: Every day | ORAL | Status: AC

## 2011-10-05 NOTE — Progress Notes (Signed)
  Subjective:    Patient ID: Christy Wade, female    DOB: [birth year], 76 y.o.   MRN: [id number]  HPI 76 year old white female, nonsmoker, patient of Dr. Tawanna Cooler is in today with complaints of cough and chest congestion that's been born on for about a week. She was seen in urgent care clinic about a week ago, it was giving a cortisone injection that helped significantly. She is here today requesting another cortisone injection. She denies any fevers, achiness or chills. She also has Lawyer that she hasn't taken any of Mucinex.   Review of Systems  Constitutional: Negative.   HENT: Negative.   Respiratory: Positive for cough, shortness of breath and wheezing.   Cardiovascular: Negative.   Gastrointestinal: Negative.   Musculoskeletal: Negative.   Skin: Negative.   Neurological: Negative.   Hematological: Negative.   Psychiatric/Behavioral: Negative.    Past Medical History  Diagnosis Date  . Osteoporosis   . Varicose vein   . Hypertension   . Thyroid disease     History   Social History  . Marital Status: Married    Spouse Name: N/A    Number of Children: N/A  . Years of Education: N/A   Occupational History  . Not on file.   Social History Main Topics  . Smoking status: Never Smoker   . Smokeless tobacco: Not on file  . Alcohol Use: No  . Drug Use: No  . Sexually Active:    Other Topics Concern  . Not on file   Social History Narrative  . No narrative on file    No past surgical history on file.  Family History  Problem Relation Age of Onset  . Diabetes Other   . Cancer Other     bone, breast  . Stroke Other     Allergies  Allergen Reactions  . Codeine     REACTION: nausea  . Sulfamethoxazole     REACTION: rash  . Sulfamethoxazole W/Trimethoprim     REACTION: hives    Current Outpatient Prescriptions on File Prior to Visit  Medication Sig Dispense Refill  . aspirin 81 MG tablet Take 81 mg by mouth daily as needed.       Marland Kitchen  levothyroxine (SYNTHROID, LEVOTHROID) 50 MCG tablet Take 1 tablet (50 mcg total) by mouth daily.  100 tablet  3  . lisinopril (PRINIVIL,ZESTRIL) 10 MG tablet Take 1 tablet (10 mg total) by mouth daily.  100 tablet  3    BP 136/80  Pulse 102  Temp(Src) 98 F (36.7 C) (Oral)  Wt 159 lb (72.122 kg)  SpO2 96%chart    Objective:   Physical Exam  Constitutional: She is oriented to person, place, and time. She appears well-developed and well-nourished.  HENT:  Right Ear: External ear normal.  Left Ear: External ear normal.  Nose: Nose normal.  Mouth/Throat: Oropharynx is clear and moist.  Neck: Normal range of motion. Neck supple.  Cardiovascular: Normal rate, regular rhythm and normal heart sounds.   Pulmonary/Chest: She has wheezes.  Musculoskeletal: Normal range of motion.  Neurological: She is alert and oriented to person, place, and time.  Skin: Skin is warm and dry.  Psychiatric: She has a normal mood and affect.          Assessment & Plan:  Assessment: Acute bronchitis, cough  Plan: Prednisone 40 mg daily x5 days. Continue Tessalon Perles when necessary. Patient to call the office is symptoms worsen or persist. Recheck as scheduled, and when necessary.

## 2011-10-05 NOTE — Patient Instructions (Signed)

## 2012-05-02 ENCOUNTER — Other Ambulatory Visit: Payer: Self-pay | Admitting: Family Medicine

## 2012-06-21 ENCOUNTER — Ambulatory Visit (INDEPENDENT_AMBULATORY_CARE_PROVIDER_SITE_OTHER): Payer: Medicare Other | Admitting: Family Medicine

## 2012-06-21 ENCOUNTER — Encounter: Payer: Self-pay | Admitting: Family Medicine

## 2012-06-21 VITALS — BP 130/90 | Temp 97.7°F | Ht 67.25 in | Wt 158.0 lb

## 2012-06-21 DIAGNOSIS — M810 Age-related osteoporosis without current pathological fracture: Secondary | ICD-10-CM

## 2012-06-21 DIAGNOSIS — I340 Nonrheumatic mitral (valve) insufficiency: Secondary | ICD-10-CM | POA: Insufficient documentation

## 2012-06-21 DIAGNOSIS — Z0000 Encounter for general adult medical examination without abnormal findings: Secondary | ICD-10-CM

## 2012-06-21 DIAGNOSIS — I10 Essential (primary) hypertension: Secondary | ICD-10-CM

## 2012-06-21 DIAGNOSIS — E039 Hypothyroidism, unspecified: Secondary | ICD-10-CM

## 2012-06-21 DIAGNOSIS — R011 Cardiac murmur, unspecified: Secondary | ICD-10-CM

## 2012-06-21 LAB — BASIC METABOLIC PANEL
BUN: 16 mg/dL (ref 6–23)
Calcium: 9.6 mg/dL (ref 8.4–10.5)
Creatinine, Ser: 1 mg/dL (ref 0.4–1.2)
GFR: 61.12 mL/min (ref >60.00)
Glucose, Bld: 100 mg/dL — ABNORMAL HIGH (ref 70–99)

## 2012-06-21 LAB — POCT URINALYSIS DIPSTICK
Bilirubin, UA: NEGATIVE
Glucose, UA: NEGATIVE
Ketones, UA: NEGATIVE
Spec Grav, UA: <=1.005

## 2012-06-21 LAB — CBC WITH DIFFERENTIAL/PLATELET
Basophils Relative: 0.2 % (ref 0.0–3.0)
Eosinophils Relative: 1.1 % (ref 0.0–5.0)
Hemoglobin: 12.2 g/dL (ref 12.0–15.0)
Lymphocytes Relative: 36.9 % (ref 12.0–46.0)
Neutrophils Relative %: 50.7 % (ref 43.0–77.0)
RBC: 4.05 Mil/uL (ref 3.87–5.11)
WBC: 4.7 10*3/uL (ref 4.5–10.5)

## 2012-06-21 MED ORDER — LEVOTHYROXINE SODIUM 50 MCG PO TABS: ORAL_TABLET | ORAL | Status: DC

## 2012-06-21 NOTE — Patient Instructions (Signed)
Continue your current medications  Continue exercise on a daily basis  Return in one year sooner if any problem

## 2012-06-21 NOTE — Progress Notes (Signed)
  Subjective:    Patient ID: Christy Wade, female    DOB: 08-Nov-1935, 76 y.o.   MRN: [id number]  HPI Christy Wade is a 76 year old married female nonsmoker who comes in today for general Medicare wellness examination because of a history of hypertension hypothyroidism and a mitral valve murmur  Her blood pressure on lisinopril 10 mg a day is 130/90  She's on Synthroid 50 mcg daily will check TSH level today  She is a history of a mitral valve murmur. Echocardiogram 2007 showed some MR. Murmurs not change she's asymptomatic  She gets routine eye care, dental care, BSE monthly, and you mammography, colonoscopy and GI normal, tetanus 2009, Pneumovax 2007, seasonal flu shot 2013, shingles 2009.  Cognitive function normal she walks on a regular basis home health safety reviewed no issues identified, no guns in the house, she does have a health care power of attorney and living will   Review of Systems  Constitutional: Negative.   HENT: Negative.   Eyes: Negative.   Respiratory: Negative.   Cardiovascular: Negative.   Gastrointestinal: Negative.   Genitourinary: Negative.   Musculoskeletal: Negative.   Neurological: Negative.   Hematological: Negative.   Psychiatric/Behavioral: Negative.        Objective:   Physical Exam  Constitutional: She appears well-developed and well-nourished.  HENT:  Head: Normocephalic and atraumatic.  Right Ear: External ear normal.  Left Ear: External ear normal.  Nose: Nose normal.  Mouth/Throat: Oropharynx is clear and moist.  Eyes: EOM are normal. Pupils are equal, round, and reactive to light.  Neck: Normal range of motion. Neck supple. No thyromegaly present.  Cardiovascular: Normal rate, regular rhythm, normal heart sounds and intact distal pulses.  Exam reveals no gallop and no friction rub.   No murmur heard. Pulmonary/Chest: Effort normal and breath sounds normal.  Abdominal: Soft. Bowel sounds are normal. She exhibits no distension and no mass.  There is no tenderness. There is no rebound.  Genitourinary:       Bilateral breast exam normal  She had her uterus removed years ago for nonmalignant reasons asymptomatic therefore pelvic exam not indicated  Musculoskeletal: Normal range of motion.  Lymphadenopathy:    She has no cervical adenopathy.  Neurological: She is alert. She has normal reflexes. No cranial nerve deficit. She exhibits normal muscle tone. Coordination normal.  Skin: Skin is warm and dry.       Total body skin exam shows multiple seborrheic keratosis freckles moles capillary hemangiomas but nothing unusual. She does have 3 venous lesions on her right labia  Psychiatric: She has a normal mood and affect. Her behavior is normal. Judgment and thought content normal.          Assessment & Plan:   healthy female  Hypertension continue lisinopril 10 mg daily  Hypothyroidism continue Synthroid 50 mcg daily  Mitral valve murmur unchanged observe

## 2012-06-27 ENCOUNTER — Other Ambulatory Visit: Payer: Self-pay | Admitting: *Deleted

## 2012-06-27 DIAGNOSIS — I10 Essential (primary) hypertension: Secondary | ICD-10-CM

## 2012-06-27 MED ORDER — LISINOPRIL 10 MG PO TABS: 10.0000 mg | ORAL_TABLET | Freq: Every day | ORAL | Status: DC

## 2013-01-08 ENCOUNTER — Encounter: Payer: Self-pay | Admitting: Family Medicine

## 2013-01-08 ENCOUNTER — Ambulatory Visit (INDEPENDENT_AMBULATORY_CARE_PROVIDER_SITE_OTHER): Payer: Medicare PPO | Admitting: Family Medicine

## 2013-01-08 VITALS — BP 136/86 | Temp 97.6°F | Wt 161.0 lb

## 2013-01-08 DIAGNOSIS — K625 Hemorrhage of anus and rectum: Secondary | ICD-10-CM

## 2013-01-08 MED ORDER — HYDROCORTISONE ACETATE 25 MG RE SUPP: 25.0000 mg | Freq: Two times a day (BID) | RECTAL | Status: DC

## 2013-01-08 NOTE — Patient Instructions (Addendum)
Take a stool softener on a daily basis,,,,,,,,,, milk of magnesia 2 tablespoons twice daily for example  Soak in a hot tub a water at bedtime  Dry then inserted a suppository nightly. Return in 2 weeks for followup

## 2013-01-08 NOTE — Progress Notes (Signed)
  Subjective:    Patient ID: Christy Wade, female    DOB: Dec 12, 1935, 77 y.o.   MRN: [id number]  HPI Christy Wade is a 77 year old married female who comes in today accompanied by her husband because of bright red rectal bleeding  On Friday she developed some chills vomited times once and diarrhea. This lasts about 8-10 hours. She noticed also at that time some bright red rectal bleeding. By Saturday she was fine Sunday she had a normal bowel movement but there was some blood in it. She's had a history of internal and had external hemorrhoids and in the past. She's also had a recent colonoscopy which was normal   Review of Systems    review of systems otherwise negative Objective:   Physical Exam Well-developed well-nourished female no acute distress examination the abdomen the abdomen is soft the bowel sounds are normal there is no palpable masses or tenderness. Examination of the rectum appears normal normal sphincter tone bright red blood on exam no palpable masses       Assessment & Plan:  Bright red rectal bleeding probably secondary to irritation of an internal hemorrhoid plan bowel program followup in

## 2013-01-22 ENCOUNTER — Ambulatory Visit (INDEPENDENT_AMBULATORY_CARE_PROVIDER_SITE_OTHER): Payer: Medicare PPO | Admitting: Family Medicine

## 2013-01-22 ENCOUNTER — Encounter: Payer: Self-pay | Admitting: Family Medicine

## 2013-01-22 VITALS — BP 120/80 | Temp 98.4°F | Wt 159.0 lb

## 2013-01-22 DIAGNOSIS — K625 Hemorrhage of anus and rectum: Secondary | ICD-10-CM

## 2013-01-22 NOTE — Patient Instructions (Signed)
Continue to drink lots of water and take a stool softener for good bowel movements daily  Return if any further bleeding or change in bowel habits for further evaluation

## 2013-01-22 NOTE — Progress Notes (Signed)
  Subjective:    Patient ID: ANIA LEVAY, female    DOB: 08/28/1935, 77 y.o.   MRN: [id number]  HPI Damita Dunnings is a 77 year old female who comes in today for followup of rectal bleeding  We saw a couple weeks ago she had a viral syndrome with diarrhea and prior that had some constipation. She then developed some bright red rectal bleeding. Her physical examination at that time was negative except for some mild to moderate external hemorrhoids which weren't bleeding. Rectal exam at that time was negative however we started our course of therapy with hot soaks and stool softeners and Anusol suppositories for 2 weeks. She comes back today for followup saying she feels well bowel habits are normal no bleeding.   Review of Systems    recent colonoscopy which was negative Objective:   Physical Exam Well-developed well-nourished female no acute distress examination the abdomen is negative rectum shows the hemorrhoids that were previously present are decreased in size. Digital rectal exam shows brown stool guaiac negative no palpable masses       Assessment & Plan:  Rectal bleeding from internal hemorrhoids resolved return when necessary

## 2013-06-26 ENCOUNTER — Other Ambulatory Visit: Payer: Self-pay | Admitting: Family Medicine

## 2013-08-09 ENCOUNTER — Encounter: Payer: Medicare PPO | Admitting: Family Medicine

## 2013-09-20 ENCOUNTER — Ambulatory Visit (INDEPENDENT_AMBULATORY_CARE_PROVIDER_SITE_OTHER): Payer: Medicare PPO | Admitting: Family Medicine

## 2013-09-20 ENCOUNTER — Encounter: Payer: Self-pay | Admitting: Family Medicine

## 2013-09-20 VITALS — BP 120/80 | Temp 97.7°F | Ht 67.75 in | Wt 159.0 lb

## 2013-09-20 DIAGNOSIS — I340 Nonrheumatic mitral (valve) insufficiency: Secondary | ICD-10-CM

## 2013-09-20 DIAGNOSIS — E039 Hypothyroidism, unspecified: Secondary | ICD-10-CM

## 2013-09-20 DIAGNOSIS — M810 Age-related osteoporosis without current pathological fracture: Secondary | ICD-10-CM

## 2013-09-20 DIAGNOSIS — Z0000 Encounter for general adult medical examination without abnormal findings: Secondary | ICD-10-CM

## 2013-09-20 DIAGNOSIS — I10 Essential (primary) hypertension: Secondary | ICD-10-CM

## 2013-09-20 DIAGNOSIS — I059 Rheumatic mitral valve disease, unspecified: Secondary | ICD-10-CM

## 2013-09-20 DIAGNOSIS — Z23 Encounter for immunization: Secondary | ICD-10-CM

## 2013-09-20 LAB — CBC WITH DIFFERENTIAL/PLATELET
BASOS PCT: 0.2 % (ref 0.0–3.0)
Basophils Absolute: 0 10*3/uL (ref 0.0–0.1)
EOS PCT: 2 % (ref 0.0–5.0)
Eosinophils Absolute: 0.1 10*3/uL (ref 0.0–0.7)
HEMATOCRIT: 38.4 % (ref 36.0–46.0)
HEMOGLOBIN: 13.1 g/dL (ref 12.0–15.0)
LYMPHS ABS: 1.8 10*3/uL (ref 0.7–4.0)
Lymphocytes Relative: 37.7 % (ref 12.0–46.0)
MCHC: 34 g/dL (ref 30.0–36.0)
MCV: 90.3 fl (ref 78.0–100.0)
MONO ABS: 0.5 10*3/uL (ref 0.1–1.0)
Monocytes Relative: 10.9 % (ref 3.0–12.0)
NEUTROS ABS: 2.3 10*3/uL (ref 1.4–7.7)
NEUTROS PCT: 49.2 % (ref 43.0–77.0)
Platelets: 182 10*3/uL (ref 150.0–400.0)
RBC: 4.26 Mil/uL (ref 3.87–5.11)
RDW: 13.6 % (ref 11.5–14.6)
WBC: 4.7 10*3/uL (ref 4.5–10.5)

## 2013-09-20 LAB — BASIC METABOLIC PANEL
BUN: 16 mg/dL (ref 6–23)
CHLORIDE: 100 meq/L (ref 96–112)
CO2: 24 mEq/L (ref 19–32)
Calcium: 9.6 mg/dL (ref 8.4–10.5)
Creatinine, Ser: 1 mg/dL (ref 0.4–1.2)
GFR: 56.12 mL/min — ABNORMAL LOW (ref >60.00)
GLUCOSE: 87 mg/dL (ref 70–99)
POTASSIUM: 4.6 meq/L (ref 3.5–5.1)
SODIUM: 132 meq/L — AB (ref 135–145)

## 2013-09-20 LAB — POCT URINALYSIS DIPSTICK
BILIRUBIN UA: NEGATIVE
GLUCOSE UA: NEGATIVE
Ketones, UA: NEGATIVE
NITRITE UA: POSITIVE
Protein, UA: NEGATIVE
Spec Grav, UA: <=1.005
Urobilinogen, UA: 0.2
pH, UA: 5

## 2013-09-20 LAB — HEPATIC FUNCTION PANEL
ALT: 17 U/L (ref 0–35)
AST: 24 U/L (ref 0–37)
Albumin: 4.1 g/dL (ref 3.5–5.2)
Alkaline Phosphatase: 61 U/L (ref 39–117)
BILIRUBIN TOTAL: 1 mg/dL (ref 0.3–1.2)
Bilirubin, Direct: 0.1 mg/dL (ref 0.0–0.3)
Total Protein: 8.1 g/dL (ref 6.0–8.3)

## 2013-09-20 LAB — TSH: TSH: 2.62 u[IU]/mL (ref 0.35–5.50)

## 2013-09-20 MED ORDER — LISINOPRIL 10 MG PO TABS: ORAL_TABLET | ORAL | Status: DC

## 2013-09-20 MED ORDER — LEVOTHYROXINE SODIUM 50 MCG PO TABS: ORAL_TABLET | ORAL | Status: DC

## 2013-09-20 NOTE — Progress Notes (Signed)
Pre visit review using our clinic review tool, if applicable. No additional management support is needed unless otherwise documented below in the visit note. 

## 2013-09-20 NOTE — Patient Instructions (Signed)
Continue your current medications  Return in one year sooner if any problems 

## 2013-09-20 NOTE — Progress Notes (Signed)
   Subjective:    Patient ID: Christy Wade, female    DOB: 17-Oct-1935, 78 y.o.   MRN: [id number]  HPI Christy Wade is a 78 year old married female nonsmoker comes in today for a Medicare wellness examination because of a history of hypothyroidism and hypertension  She takes Synthroid 50 mcg daily labs today  She takes lisinopril 10 mg daily for hypertension BP 120/80  She also takes an aspirin tablet daily. She has a history of mitral valve disease asymptomatic  She gets routine eye care, dental care, recent colonoscopy normal, vaccinations updated by Christy Wade  Cognitive function normal she walks on regular basis home health safety reviewed no issues identified, no guns in the house, she does have a health care power of attorney and living will   Review of Systems  Constitutional: Negative.   HENT: Negative.   Eyes: Negative.   Respiratory: Negative.   Cardiovascular: Negative.   Gastrointestinal: Negative.   Genitourinary: Negative.   Musculoskeletal: Negative.   Neurological: Negative.   Psychiatric/Behavioral: Negative.        Objective:   Physical Exam  Nursing note and vitals reviewed. Constitutional: She appears well-developed and well-nourished.  HENT:  Head: Normocephalic and atraumatic.  Right Ear: External ear normal.  Left Ear: External ear normal.  Nose: Nose normal.  Mouth/Throat: Oropharynx is clear and moist.  Eyes: EOM are normal. Pupils are equal, round, and reactive to light.  Neck: Normal range of motion. Neck supple. No thyromegaly present.  Cardiovascular: Normal rate, regular rhythm, normal heart sounds and intact distal pulses.  Exam reveals no gallop and no friction rub.   No murmur heard. No carotid or at bruits peripheral pulses 2+ and symmetrical  Pulmonary/Chest: Effort normal and breath sounds normal.  Abdominal: Soft. Bowel sounds are normal. She exhibits no distension and no mass. There is no tenderness. There is no rebound.  Genitourinary:    Bilateral breast exam normal  Musculoskeletal: Normal range of motion.  Lymphadenopathy:    She has no cervical adenopathy.  Neurological: She is alert. She has normal reflexes. No cranial nerve deficit. She exhibits normal muscle tone. Coordination normal.  Skin: Skin is warm and dry.  Psychiatric: She has a normal mood and affect. Her behavior is normal. Judgment and thought content normal.   Great two over three systolic ejection murmur heard best at the lower left sternal order consistent with some mild MS       Assessment & Plan:  Healthy female  Hypothyroidism continue Synthroid  Hypertension continue lisinopril  History of mitral valve disease asymptomatic

## 2013-09-21 ENCOUNTER — Telehealth: Payer: Self-pay | Admitting: Family Medicine

## 2013-09-21 NOTE — Telephone Encounter (Signed)
Relevant patient education assigned to patient using Emmi. ° °

## 2014-03-20 ENCOUNTER — Encounter: Payer: Self-pay | Admitting: Internal Medicine

## 2014-09-09 ENCOUNTER — Ambulatory Visit (INDEPENDENT_AMBULATORY_CARE_PROVIDER_SITE_OTHER): Payer: Medicare PPO | Admitting: Family Medicine

## 2014-09-09 ENCOUNTER — Encounter: Payer: Self-pay | Admitting: Family Medicine

## 2014-09-09 VITALS — BP 120/86 | Temp 97.5°F | Wt 158.0 lb

## 2014-09-09 DIAGNOSIS — R0789 Other chest pain: Secondary | ICD-10-CM

## 2014-09-09 DIAGNOSIS — G8929 Other chronic pain: Secondary | ICD-10-CM | POA: Insufficient documentation

## 2014-09-09 MED ORDER — CYCLOBENZAPRINE HCL 5 MG PO TABS: ORAL_TABLET | ORAL | Status: DC

## 2014-09-09 MED ORDER — TRAMADOL HCL 50 MG PO TABS: ORAL_TABLET | ORAL | Status: DC

## 2014-09-09 NOTE — Progress Notes (Signed)
   Subjective:    Patient ID: Christy Wade, female    DOB: 07-18-1936, 79 y.o.   MRN: [id number]  HPI  Christy Wade is a 79 year old female married nonsmoker who comes in today coming by her sister [name] for evaluation of back pain  She's had right subscapular back pain off-and-on for the past 12 years. She's had numerous x-ray studies all of which have been normal. Sunday she played the piano chin church and then developed severe pain from bending over. She went home. Her sister came to see her. [name] is a Marine scientist. [name] appropriately evaluate her did not think she had a life-threatening emergency and treated her symptomatically with Flexeril and tramadol and she improved markedly.  Review of Systems Review of systems otherwise negative no shortness of breath    Objective:   Physical Exam  Well-developed well-nourished female no acute distress vital signs stable she's afebrile cardiopulmonary exam normal      Assessment & Plan:  Chest wall pain,,,,,,,,,,,, treat symptomatically with Motrin Flexeril and tramadol,

## 2014-09-09 NOTE — Progress Notes (Signed)
Pre visit review using our clinic review tool, if applicable. No additional management support is needed unless otherwise documented below in the visit note. 

## 2014-09-09 NOTE — Patient Instructions (Signed)
Tramadol 50 mg.......Marland Kitchen 1/2-1 tablet 3 times a day for severe pain  Flexeril 5 mg......Marland Kitchen 1 tablet 3 times daily  At this juncture since you're feeling so much better I would recommend Motrin 400 mg twice daily with food,,,,,,,, and Flexeril and a half a tramadol bedtime until the pain resolves

## 2014-09-24 ENCOUNTER — Ambulatory Visit: Payer: Medicare PPO | Admitting: Family Medicine

## 2014-09-24 ENCOUNTER — Ambulatory Visit (INDEPENDENT_AMBULATORY_CARE_PROVIDER_SITE_OTHER): Payer: Medicare PPO | Admitting: Family Medicine

## 2014-09-24 ENCOUNTER — Telehealth: Payer: Self-pay | Admitting: Family Medicine

## 2014-09-24 VITALS — BP 130/84 | Temp 97.9°F | Ht 67.5 in | Wt 158.0 lb

## 2014-09-24 DIAGNOSIS — M810 Age-related osteoporosis without current pathological fracture: Secondary | ICD-10-CM

## 2014-09-24 DIAGNOSIS — E039 Hypothyroidism, unspecified: Secondary | ICD-10-CM

## 2014-09-24 DIAGNOSIS — I340 Nonrheumatic mitral (valve) insufficiency: Secondary | ICD-10-CM

## 2014-09-24 DIAGNOSIS — D509 Iron deficiency anemia, unspecified: Secondary | ICD-10-CM

## 2014-09-24 DIAGNOSIS — I10 Essential (primary) hypertension: Secondary | ICD-10-CM

## 2014-09-24 LAB — POCT URINALYSIS DIPSTICK
BILIRUBIN UA: NEGATIVE
Glucose, UA: NEGATIVE
Ketones, UA: NEGATIVE
Nitrite, UA: NEGATIVE
Protein, UA: NEGATIVE
Urobilinogen, UA: 0.2
pH, UA: 5.5

## 2014-09-24 LAB — CBC WITH DIFFERENTIAL/PLATELET
Basophils Absolute: 0 10*3/uL (ref 0.0–0.1)
Basophils Relative: 0.3 % (ref 0.0–3.0)
EOS ABS: 0.1 10*3/uL (ref 0.0–0.7)
EOS PCT: 0.8 % (ref 0.0–5.0)
HCT: 25.9 % — ABNORMAL LOW (ref 36.0–46.0)
Hemoglobin: 8.5 g/dL — ABNORMAL LOW (ref 12.0–15.0)
Lymphocytes Relative: 18.9 % (ref 12.0–46.0)
Lymphs Abs: 1.3 10*3/uL (ref 0.7–4.0)
MCHC: 32.9 g/dL (ref 30.0–36.0)
MCV: 81.3 fl (ref 78.0–100.0)
MONO ABS: 0.5 10*3/uL (ref 0.1–1.0)
Monocytes Relative: 7.9 % (ref 3.0–12.0)
NEUTROS ABS: 4.9 10*3/uL (ref 1.4–7.7)
NEUTROS PCT: 72.1 % (ref 43.0–77.0)
Platelets: 250 10*3/uL (ref 150.0–400.0)
RBC: 3.19 Mil/uL — ABNORMAL LOW (ref 3.87–5.11)
RDW: 13.7 % (ref 11.5–15.5)
WBC: 6.8 10*3/uL (ref 4.0–10.5)

## 2014-09-24 LAB — LIPID PANEL
CHOL/HDL RATIO: 3
Cholesterol: 155 mg/dL (ref 0–200)
HDL: 55.6 mg/dL (ref >39.00)
LDL Cholesterol: 80 mg/dL (ref 0–99)
NONHDL: 99.4
TRIGLYCERIDES: 97 mg/dL (ref 0.0–149.0)
VLDL: 19.4 mg/dL (ref 0.0–40.0)

## 2014-09-24 LAB — HEPATIC FUNCTION PANEL
ALK PHOS: 59 U/L (ref 39–117)
ALT: 10 U/L (ref 0–35)
AST: 16 U/L (ref 0–37)
Albumin: 3.9 g/dL (ref 3.5–5.2)
BILIRUBIN TOTAL: 0.4 mg/dL (ref 0.2–1.2)
Bilirubin, Direct: 0.1 mg/dL (ref 0.0–0.3)
Total Protein: 7.7 g/dL (ref 6.0–8.3)

## 2014-09-24 LAB — BASIC METABOLIC PANEL
BUN: 13 mg/dL (ref 6–23)
CALCIUM: 9.4 mg/dL (ref 8.4–10.5)
CHLORIDE: 104 meq/L (ref 96–112)
CO2: 25 meq/L (ref 19–32)
Creatinine, Ser: 1.07 mg/dL (ref 0.40–1.20)
GFR: 52.96 mL/min — ABNORMAL LOW (ref >60.00)
GLUCOSE: 95 mg/dL (ref 70–99)
Potassium: 4.5 mEq/L (ref 3.5–5.1)
SODIUM: 134 meq/L — AB (ref 135–145)

## 2014-09-24 LAB — TSH: TSH: 4.74 u[IU]/mL — ABNORMAL HIGH (ref 0.35–4.50)

## 2014-09-24 MED ORDER — LEVOTHYROXINE SODIUM 50 MCG PO TABS: ORAL_TABLET | ORAL | Status: DC

## 2014-09-24 MED ORDER — LISINOPRIL 10 MG PO TABS: ORAL_TABLET | ORAL | Status: DC

## 2014-09-24 NOTE — Addendum Note (Signed)
Addended by: TODD, JEFFREY A on: 09/24/2014 04:31 PM   Modules accepted: Orders, Level of Service

## 2014-09-24 NOTE — Progress Notes (Signed)
Pre visit review using our clinic review tool, if applicable. No additional management support is needed unless otherwise documented below in the visit note. 

## 2014-09-24 NOTE — Patient Instructions (Addendum)
Continue current medications  Labs today  Continue daily exercise calcium and vitamin D  Return in one year sooner if any problems  [employer]....... is our new adult nurse practitioner's  Stop all NSAIDs  Iron one twice daily  Prilosec 20 mg....... one twice daily  We will get you set up for a GI consult ASAP

## 2014-09-24 NOTE — Progress Notes (Signed)
   Subjective:    Patient ID: Christy Wade, female    DOB: 03/19/1936, 79 y.o.   MRN: [id number]  HPI Sheanna is a 79 year old female who comes back today for reevaluation. She was seen this morning with general physical exam it was normal. Labs showed a hemoglobin of 8.5. She never had a history of anemia in the past except when she had dysfunctional uterine bleeding many many years ago. She had her uterus removed since that time her blood count is always been normal. Last hemoglobin year ago was 13. She says she been taken some NSAIDs about 400 mg once or twice a day for the past week but nothing chronic. She says her bowel movements been normal.   Review of Systems    review of systems otherwise negative Objective:   Physical Exam  Well-developed well-nourished female no acute distress vital signs stable she's afebrile rectal exam normal stool brown and guaiac positive      Assessment & Plan:  GI bleed slow leak........... stop all NSAIDs....... although I doubt they're the culprit since she's only been on it for about a week.......Marland Kitchen but that's always possible...Marland KitchenMarland KitchenMarland Kitchen start iron........ check levels....... GI evaluation ASAP

## 2014-09-24 NOTE — Progress Notes (Signed)
   Subjective:    Patient ID: Christy Wade, female    DOB: September 12, 1935, 79 y.o.   MRN: [id number]  HPI Christy Wade is a 79 year old married female nonsmoker who comes in today for general physical examination because of a history of hypertension, hypothyroidism, osteoporosis,  Her med list reviewed its correct.  She gets routine eye care, dental care, BSE monthly, annual mammography, no normal needs colonoscopies.  She had a uterus and ovaries removed many years ago for nonmalignant reasons  Cognitive function normal she walks on a daily basis home health safety reviewed no issues identified, no guns in the house, she does have a healthcare power of attorney and living well  All vaccinations up-to-date   Review of Systems  Constitutional: Negative.   HENT: Negative.   Eyes: Negative.   Respiratory: Negative.   Cardiovascular: Negative.   Gastrointestinal: Negative.   Endocrine: Negative.   Genitourinary: Negative.   Musculoskeletal: Negative.   Skin: Negative.   Allergic/Immunologic: Negative.   Neurological: Negative.   Hematological: Negative.   Psychiatric/Behavioral: Negative.        Objective:   Physical Exam  Constitutional: She appears well-developed and well-nourished.  HENT:  Head: Normocephalic and atraumatic.  Right Ear: External ear normal.  Left Ear: External ear normal.  Nose: Nose normal.  Mouth/Throat: Oropharynx is clear and moist.  Eyes: EOM are normal. Pupils are equal, round, and reactive to light.  Neck: Normal range of motion. Neck supple. No JVD present. No tracheal deviation present. No thyromegaly present.  Cardiovascular: Normal rate, regular rhythm, normal heart sounds and intact distal pulses.  Exam reveals no gallop and no friction rub.   No murmur heard. No carotid aortic bruits peripheral pulses 2+ and symmetrical  Pulmonary/Chest: Effort normal and breath sounds normal. No stridor. No respiratory distress. She has no wheezes. She has no  rales. She exhibits no tenderness.  Abdominal: Soft. Bowel sounds are normal. She exhibits no distension and no mass. There is no tenderness. There is no rebound and no guarding.  Genitourinary:  Uterus and ovaries removed for nonmalignant reasons therefore pelvic exam not indicated  Bilateral breast exam normal  Musculoskeletal: Normal range of motion.  Lymphadenopathy:    She has no cervical adenopathy.  Neurological: She is alert. She has normal reflexes. No cranial nerve deficit. She exhibits normal muscle tone. Coordination normal.  Skin: Skin is warm and dry. No rash noted. No erythema. No pallor.  Total body skin exam normal she has a garden variety of freckles capillary hemangiomas moles seborrheic keratosis. Scar left breast 3:00 position she went to dermatology and had a lesion removed was benign  Psychiatric: She has a normal mood and affect. Her behavior is normal. Judgment and thought content normal.  Nursing note and vitals reviewed.         Assessment & Plan:  Healthy female  Hypertension ago continue current therapy  Hypothyroidism at goal check labs  Osteoporosis continue calcium vitamin D and daily exercise

## 2014-09-25 ENCOUNTER — Ambulatory Visit (INDEPENDENT_AMBULATORY_CARE_PROVIDER_SITE_OTHER): Payer: Medicare PPO | Admitting: Gastroenterology

## 2014-09-25 ENCOUNTER — Telehealth: Payer: Self-pay | Admitting: Internal Medicine

## 2014-09-25 ENCOUNTER — Other Ambulatory Visit: Payer: Self-pay

## 2014-09-25 ENCOUNTER — Encounter: Payer: Self-pay | Admitting: Gastroenterology

## 2014-09-25 VITALS — BP 120/64 | HR 64 | Ht 67.5 in | Wt 158.4 lb

## 2014-09-25 DIAGNOSIS — R195 Other fecal abnormalities: Secondary | ICD-10-CM

## 2014-09-25 DIAGNOSIS — D649 Anemia, unspecified: Secondary | ICD-10-CM

## 2014-09-25 DIAGNOSIS — D500 Iron deficiency anemia secondary to blood loss (chronic): Secondary | ICD-10-CM | POA: Insufficient documentation

## 2014-09-25 NOTE — Telephone Encounter (Signed)
Received an urgent referral for this patient.  She has a hx with Dr. Olevia Perches from 2005.  From the notes it looks like hemoglobin has dropped from 13 to 8.5 and has Heme positive stools.  There are not morning apts with extenders available until next week.  Advise.

## 2014-09-25 NOTE — Progress Notes (Signed)
09/25/2014 Christy Wade [id number] 02/04/1936   HISTORY OF PRESENT ILLNESS:  This is a pleasant 79 year old female who is begin referred here by her PCP, Dr. Sherren Mocha, for anemia and heme positive stool.  She apparently had a colonoscopy in 2005 that she thinks was performed here, but we do not have those records available to [place] at this time.  She had a routine yearly visit with her PCP yesterday and Hgb was found to be 8.5 grams.  The last for comparison was one year ago at which time it was 13.1 grams.  She denies seeing red blood in her stool and says that her stools had been normal until just a couple of days ago when they were much darker in color (says that they were black, but not thick tarry black).  Has only been moving her bowel about every other day.  Was heme positive at PCP's office yesterday.  Was taking an ASA 81 mg, but that is now on hold.  She was also recently taking some Advil since 2/22 for chest wall pain, but that has also now been stopped.  She was started on prilosec 20 mg BID yesterday as well as an iron supplement.  Complains of feeling weak and tired recently, but denies dizziness.  No other GI complaints.     Past Medical History  Diagnosis Date  . Osteoporosis   . Varicose vein   . Hypertension   . Thyroid disease    History reviewed. No pertinent past surgical history.  reports that she has never smoked. She has never used smokeless tobacco. She reports that she does not drink alcohol or use illicit drugs. family history includes Cancer in her other; Diabetes in her other; Stroke in her other. Allergies  Allergen Reactions  . Codeine     REACTION: nausea  . Sulfamethoxazole     REACTION: rash  . Sulfamethoxazole-Trimethoprim     REACTION: hives      Outpatient Encounter Prescriptions as of 09/25/2014  Medication Sig  . calcium carbonate 200 MG capsule Take 250 mg by mouth 2 (two) times daily with a meal.  . cyclobenzaprine (FLEXERIL) 5 MG tablet 1  by mouth daily at bedtime when necessary  . levothyroxine (SYNTHROID, LEVOTHROID) 50 MCG tablet TAKE ONE TABLET BY MOUTH EVERY MORNING  . lisinopril (PRINIVIL,ZESTRIL) 10 MG tablet TAKE ONE TABLET BY MOUTH DAILY  . Multiple Vitamin (MULTIVITAMIN) tablet Take 1 tablet by mouth daily.  . traMADol (ULTRAM) 50 MG tablet 1/2-1 tablet at bedtime for pain  . aspirin 81 MG tablet Take 81 mg by mouth daily as needed.      REVIEW OF SYSTEMS  : All other systems reviewed and negative except where noted in the History of Present Illness.   PHYSICAL EXAM: BP 120/64 mmHg  Pulse 64  Ht 5' 7.5" (1.715 m)  Wt 158 lb 6.4 oz (71.85 kg)  BMI 24.43 kg/m2 General: Well developed white female in no acute distress Head: Normocephalic and atraumatic Eyes:  Sclerae anicteric,conjunctive pink. Ears: Normal auditory acuity Lungs: Clear throughout to auscultation Heart: Regular rate and rhythm Abdomen: Soft, non-distended.  Normal bowel sounds.  Non-tender. Rectal:  Heme positive at PCP's office yesterday. Musculoskeletal: Symmetrical with no gross deformities  Skin: No lesions on visible extremities Extremities: No edema  Neurological: Alert oriented x 4, grossly non-focal Psychological:  Alert and cooperative. Normal mood and affect  ASSESSMENT AND PLAN: -79 year old with Hgb of 8.5 grams compared  to normal 13.1 grams last year and with dark heme positive stool recently.  I do not think that she is actively bleeding or that she necessarily had a large acute GI, but patient needs EGD for evaluation and should have colonoscopy as well.  Rule out ulcer disease, AVM's, etc. Will schedule with Dr. Ardis Hughs as he has the first available to perform a more urgent procedure.  The risks, benefits, and alternatives were discussed with the patient and she consents to proceed.  Will recheck Hgb tomorrow to assess stability.  Agree with prilosec 20 mg BID for now.   CC:  Dorena Cookey, MD

## 2014-09-25 NOTE — Telephone Encounter (Signed)
May use afternoon extender appointment.

## 2014-09-25 NOTE — Telephone Encounter (Signed)
Pt scd with Janett Billow on 3/9 @ 2pm

## 2014-09-25 NOTE — Patient Instructions (Signed)
You will have your EGD and colonoscopy on Monday 09/30/14 at 2:30 pm. You will arrive to the 4th floor of [facility] at 1:30 pm on that day. Please remember to go to the lab tomorrow at any time between 8 and 5. You do not have to fast. The lab is in the basement on the left when you step out of the elevator.

## 2014-09-26 ENCOUNTER — Other Ambulatory Visit (INDEPENDENT_AMBULATORY_CARE_PROVIDER_SITE_OTHER): Payer: Medicare PPO

## 2014-09-26 DIAGNOSIS — D649 Anemia, unspecified: Secondary | ICD-10-CM

## 2014-09-26 DIAGNOSIS — R195 Other fecal abnormalities: Secondary | ICD-10-CM

## 2014-09-26 LAB — HEMOGLOBIN

## 2014-09-26 NOTE — Progress Notes (Signed)
I agree with the above note, plan 

## 2014-09-30 ENCOUNTER — Encounter: Payer: Self-pay | Admitting: Gastroenterology

## 2014-09-30 ENCOUNTER — Ambulatory Visit (AMBULATORY_SURGERY_CENTER): Payer: Medicare PPO | Admitting: Gastroenterology

## 2014-09-30 VITALS — BP 145/73 | HR 76 | Temp 97.0°F | Resp 18 | Ht 67.0 in | Wt 158.0 lb

## 2014-09-30 DIAGNOSIS — D500 Iron deficiency anemia secondary to blood loss (chronic): Secondary | ICD-10-CM

## 2014-09-30 DIAGNOSIS — K2970 Gastritis, unspecified, without bleeding: Secondary | ICD-10-CM

## 2014-09-30 DIAGNOSIS — R195 Other fecal abnormalities: Secondary | ICD-10-CM

## 2014-09-30 DIAGNOSIS — K2990 Gastroduodenitis, unspecified, without bleeding: Secondary | ICD-10-CM

## 2014-09-30 DIAGNOSIS — K2940 Chronic atrophic gastritis without bleeding: Secondary | ICD-10-CM | POA: Diagnosis not present

## 2014-09-30 MED ORDER — SODIUM CHLORIDE 0.9 % IV SOLN: 500.0000 mL | INTRAVENOUS | Status: DC

## 2014-09-30 NOTE — Patient Instructions (Signed)

## 2014-09-30 NOTE — Progress Notes (Signed)
Called to room to assist during endoscopic procedure.  Patient ID and intended procedure confirmed with present staff. Received instructions for my participation in the procedure from the performing physician.  

## 2014-09-30 NOTE — Op Note (Signed)
[facility]  [address]. [place], [id number]   COLONOSCOPY PROCEDURE REPORT  PATIENT: Christy Wade, [name]  MR#: [id number] BIRTHDATE: [birth year] , 78  yrs. old GENDER: female ENDOSCOPIST: Milus Banister, MD REFERRED [id number] Delora Fuel, M.D. PROCEDURE DATE:  09/30/2014 PROCEDURE:   Colonoscopy, diagnostic First Screening Colonoscopy - Avg.  risk and is 50 yrs.  old or older - No.  Prior Negative Screening - Now for repeat screening. 10 or more years since last screening  History of Adenoma - Now for follow-up colonoscopy & has been > or = to 3 yrs.  N/A ASA CLASS:   Class III INDICATIONS:hemocult + stool, signficant new anemia. MEDICATIONS: Monitored anesthesia care and Propofol 200 mg IV  DESCRIPTION OF PROCEDURE:   After the risks benefits and alternatives of the procedure were thoroughly explained, informed consent was obtained.  The digital rectal exam revealed no abnormalities of the rectum.   The LB CF-H180AL Loaner [id number] endoscope was introduced through the anus and advanced to the cecum, which was identified by both the appendix and ileocecal valve. No adverse events experienced.   The quality of the prep was excellent.  The instrument was then slowly withdrawn as the colon was fully examined.   COLON FINDINGS: A normal appearing cecum, ileocecal valve, and appendiceal orifice were identified.  The ascending, transverse, descending, sigmoid colon, and rectum appeared unremarkable. Retroflexed views revealed no abnormalities. The time to cecum = 5.7 Withdrawal time = 6.1   The scope was withdrawn and the procedure completed. COMPLICATIONS: There were no immediate complications.  ENDOSCOPIC IMPRESSION: Normal colonoscopy No polyps or cancers  RECOMMENDATIONS: Will proceed with EGD now  eSigned:  Milus Banister, MD 09/30/2014 2:41 PM

## 2014-09-30 NOTE — Progress Notes (Signed)
Report to PACU, RN, vss, BBS= Clear.  

## 2014-09-30 NOTE — Op Note (Signed)
[facility]  [address] [place], [address]   ENDOSCOPY PROCEDURE REPORT  PATIENT: Christy Wade, Christy Wade  MR#: [id number] BIRTHDATE: December 31, 1935 , 78  yrs. old GENDER: female ENDOSCOPIST: Milus Banister, MD PROCEDURE DATE:  09/30/2014 PROCEDURE:  EGD w/ biopsy ASA CLASS:     Class III INDICATIONS:  hemocult + stool, new signficant anemia. MEDICATIONS: Monitored anesthesia care, Propofol 40 mg IV, and lidocaine 100mg  IV TOPICAL ANESTHETIC: none  DESCRIPTION OF PROCEDURE: After the risks benefits and alternatives of the procedure were thoroughly explained, informed consent was obtained.  The LB [id number] endoscope was introduced through the mouth and advanced to the second portion of the duodenum , Without limitations.  The instrument was slowly withdrawn as the mucosa was fully examined.  There was moderate pan-gastritis.  This was non-specific, biopsied were taken from body and antrum and sent to pathology.  The duodenum was normal, this was biopsied and sent to pathology. There was a 1cm hiatal hernia.  The examination was otherwise normal.  Retroflexed views revealed no abnormalities.     The scope was then withdrawn from the patient and the procedure completed. COMPLICATIONS: There were no immediate complications.  ENDOSCOPIC IMPRESSION: There was moderate pan-gastritis.  This was non-specific, biopsied were taken from body and antrum and sent to pathology.  The duodenum was normal, this was biopsied and sent to pathology. There was a 1cm hiatal hernia.  The examination was otherwise normal  RECOMMENDATIONS: Await final pathology report.  If no clear explanation for her new anemia then will likely set up CT scan of chest/abd/pelvis.  If that is also unhelpful, the next test would be capsule endoscopy.   eSigned:  Milus Banister, MD 09/30/2014 2:45 PM    CC: Christie Nottingham, MD

## 2014-10-01 ENCOUNTER — Telehealth: Payer: Self-pay | Admitting: *Deleted

## 2014-10-01 NOTE — Telephone Encounter (Signed)
  Follow up Call-  Call back number 09/30/2014  Post procedure Call Back phone  # [phone]  Permission to leave phone message Yes     Patient questions:  Do you have a fever, pain , or abdominal swelling? No. Pain Score  0 *  Have you tolerated food without any problems? Yes.    Have you been able to return to your normal activities? Yes.    Do you have any questions about your discharge instructions: Diet   No. Medications  No. Follow up visit  No.  Do you have questions or concerns about your Care? No.  Actions: * If pain score is 4 or above: No action needed, pain <4.

## 2014-10-10 ENCOUNTER — Other Ambulatory Visit: Payer: Self-pay

## 2014-10-10 DIAGNOSIS — D649 Anemia, unspecified: Secondary | ICD-10-CM

## 2014-10-10 NOTE — Progress Notes (Signed)
You have been scheduled for a CT scan of the abdomen and pelvis at [facility] ([address]---this is in the same building as [facility]).   You are scheduled on 10/16/14 at 930 am. You should arrive 15 minutes prior to your appointment time for registration. Please follow the written instructions below on the day of your exam:  WARNING: IF YOU ARE ALLERGIC TO IODINE/X-RAY DYE, PLEASE NOTIFY RADIOLOGY IMMEDIATELY AT [phone]! YOU WILL BE GIVEN A 13 HOUR PREMEDICATION PREP.  1) Do not eat or drink anything after 530 am (4 hours prior to your test) 2) You have been given 2 bottles of oral contrast to drink. The solution may taste  better if refrigerated, but do NOT add ice or any other liquid to this solution. Shake well before drinking.    Drink 1 bottle of contrast @ 730 am (2 hours prior to your exam)  Drink 1 bottle of contrast @ 830 am (1 hour prior to your exam)  You may take any medications as prescribed with a small amount of water except for the following: Metformin, Glucophage, Glucovance, Avandamet, Riomet, Fortamet, Actoplus Met, Janumet, Glumetza or Metaglip. The above medications must be held the day of the exam AND 48 hours after the exam.  The purpose of you drinking the oral contrast is to aid in the visualization of your intestinal tract. The contrast solution may cause some diarrhea. Before your exam is started, you will be given a small amount of fluid to drink. Depending on your individual set of symptoms, you may also receive an intravenous injection of x-ray contrast/dye. Plan on being at [facility] for 30 minutes or longer, depending on the type of exam you are having performed.  This test typically takes 30-45 minutes to complete.  If you have any questions regarding your exam or if you need to reschedule, you may call the CT department at [phone] between the hours of 8:00 am and 5:00 pm,  Monday-Friday.  ________________________________________________________________________ Pt has been instructed and will pick up contrast

## 2014-10-16 ENCOUNTER — Ambulatory Visit
Admission: RE | Admit: 2014-10-16 | Discharge: 2014-10-16 | Disposition: A | Discharge status: Home / Self Care | Payer: Medicare PPO | Source: Ambulatory Visit | Attending: Gastroenterology | Admitting: Gastroenterology

## 2014-10-16 ENCOUNTER — Telehealth: Payer: Self-pay | Admitting: Gastroenterology

## 2014-10-16 ENCOUNTER — Other Ambulatory Visit (INDEPENDENT_AMBULATORY_CARE_PROVIDER_SITE_OTHER): Payer: Medicare PPO

## 2014-10-16 ENCOUNTER — Ambulatory Visit (INDEPENDENT_AMBULATORY_CARE_PROVIDER_SITE_OTHER)
Admission: RE | Admit: 2014-10-16 | Discharge: 2014-10-16 | Disposition: A | Discharge status: Home / Self Care | Payer: Medicare PPO | Source: Ambulatory Visit | Attending: Gastroenterology | Admitting: Gastroenterology

## 2014-10-16 DIAGNOSIS — D649 Anemia, unspecified: Secondary | ICD-10-CM

## 2014-10-16 LAB — CBC WITH DIFFERENTIAL/PLATELET
BASOS ABS: 0 10*3/uL (ref 0.0–0.1)
Basophils Relative: 0.2 % (ref 0.0–3.0)
Eosinophils Absolute: 0 10*3/uL (ref 0.0–0.7)
Eosinophils Relative: 0.6 % (ref 0.0–5.0)
HEMATOCRIT: 29.1 % — AB (ref 36.0–46.0)
Hemoglobin: 9.8 g/dL — ABNORMAL LOW (ref 12.0–15.0)
Lymphocytes Relative: 39.1 % (ref 12.0–46.0)
Lymphs Abs: 2.1 10*3/uL (ref 0.7–4.0)
MCHC: 33.6 g/dL (ref 30.0–36.0)
MCV: 85.1 fl (ref 78.0–100.0)
Monocytes Absolute: 0.5 10*3/uL (ref 0.1–1.0)
Monocytes Relative: 10.3 % (ref 3.0–12.0)
NEUTROS ABS: 2.6 10*3/uL (ref 1.4–7.7)
Neutrophils Relative %: 49.8 % (ref 43.0–77.0)
PLATELETS: 219 10*3/uL (ref 150.0–400.0)
RBC: 3.43 Mil/uL — AB (ref 3.87–5.11)
RDW: 20.9 % — AB (ref 11.5–15.5)
WBC: 5.3 10*3/uL (ref 4.0–10.5)

## 2014-10-16 MED ORDER — IOHEXOL 300 MG/ML  SOLN
100.0000 mL | Freq: Once | INTRAMUSCULAR | Status: AC | PRN
  Administered 2014-10-16: 100 mL via INTRAVENOUS

## 2014-10-16 NOTE — Telephone Encounter (Signed)
Left message on machine to call back  

## 2014-10-16 NOTE — Telephone Encounter (Signed)
Pt walked in wanting a repeat CBC today, I got a verbal ok from [place] for the lab.  Also, Dr Ardis Hughs we got a call report on her CT abd pelvis the chest was denied per her insurance.  I consulted Janett Billow and she stated it was ok to wait for you to address the findings.

## 2014-10-18 NOTE — Telephone Encounter (Signed)
OK, thanks

## 2014-10-22 ENCOUNTER — Encounter: Payer: Self-pay | Admitting: Internal Medicine

## 2014-10-22 ENCOUNTER — Other Ambulatory Visit: Payer: Self-pay

## 2014-10-22 DIAGNOSIS — R933 Abnormal findings on diagnostic imaging of other parts of digestive tract: Secondary | ICD-10-CM

## 2014-11-13 ENCOUNTER — Other Ambulatory Visit: Payer: Self-pay | Admitting: Surgery

## 2014-11-13 NOTE — H&P (Signed)
Christy Wade 11/13/2014 1:31 PM Location: [facility] Patient #: [id number] DOB: 1936-05-07 Married / Language: Cleophus Molt / Race: White Female History of Present Illness Adin Hector MD; 11/13/2014 2:43 PM) Patient words: resection small bowel.  The patient is a 79 year old female who presents with a complaint of mass in intestine. Pleasant patient sent by her gastroenterologist Dr. Oretha Caprice. Concern for GI try loss anemia and mass in the ileum.  Pleasant elderly female. History of hysterectomy but no other surgeries. Was found to be anemic with hemoglobin around 8 on annual exam. Feeling somewhat fatigued. Sent to gastroenterology. Underwhelming upper and lower endoscopies. Mild hiatal hernia and gastritis. No Helicobacter pylori. Colonoscopy completely normal. CT scan showed mass in the mid ileum with thickening and possible lymphadenopathy. Suspicious for small bowel tumor. Surgical consultation requested to see if resection appropriate.  Patient can walk about 20 minutes without difficulty. Has a bowel movement about every day or so. She denies any abdominal pain. She's been on iron and her hemoglobin is now on the upper nines. She is here today with her sister who is a Marine scientist and other family member. No history of Crohn's/Ulcerative colitis. No inflammatory bowel disease. No irritable bowel syndrome. No family history of colon or bowel cancer carcinoid that she is aware of. Other Problems Elbert Ewings, CMA; 11/13/2014 1:31 PM) Hemorrhoids  Past Surgical History Elbert Ewings, CMA; 11/13/2014 1:31 PM) Appendectomy Hysterectomy (not due to cancer) - Partial Knee Surgery Right. Oral Surgery  Diagnostic Studies History Elbert Ewings, [place]; 11/13/2014 1:31 PM) Colonoscopy within last year Mammogram within last year Pap Smear >5 years ago  Allergies Elbert Ewings, CMA; 11/13/2014 1:33 PM) Codeine Phosphate *ANALGESICS - OPIOID* Sulfa 10 *OPHTHALMIC  AGENTS*  Medication History Elbert Ewings, CMA; 11/13/2014 1:34 PM) TraMADol HCl (50MG  Tablet, Oral) Active. Cyclobenzaprine HCl (5MG  Tablet, Oral) Active. Levothyroxine Sodium (50MCG Tablet, Oral) Active. Lisinopril (10MG  Tablet, Oral) Active. Multi-Minerals (Oral) Active. Aspirin (81MG  Tablet, Oral) Active. Medications Reconciled  Social History Elbert Ewings, [place]; 11/13/2014 1:31 PM) No alcohol use No caffeine use No drug use Tobacco use Never smoker.  Family History Elbert Ewings, [place]; 11/13/2014 1:31 PM) Breast Cancer Sister. Cancer Mother. Diabetes Mellitus Father. Thyroid problems Mother.  Pregnancy / Birth History Elbert Ewings, CMA; 11/13/2014 1:31 PM) Age at menarche 45 years. Age of menopause 30-55 Gravida 1 Maternal age 60-30 Para 1     Review of Systems Elbert Ewings CMA; 11/13/2014 1:31 PM) General Present- Fatigue. Not Present- Appetite Loss, Chills, Fever, Night Sweats, Weight Gain and Weight Loss. Skin Not Present- Change in Wart/Mole, Dryness, Hives, Jaundice, New Lesions, Non-Healing Wounds, Rash and Ulcer. HEENT Not Present- Earache, Hearing Loss, Hoarseness, Nose Bleed, Oral Ulcers, Ringing in the Ears, Seasonal Allergies, Sinus Pain, Sore Throat, Visual Disturbances, Wears glasses/contact lenses and Yellow Eyes. Respiratory Not Present- Bloody sputum, Chronic Cough, Difficulty Breathing, Snoring and Wheezing. Breast Not Present- Breast Mass, Breast Pain, Nipple Discharge and Skin Changes. Cardiovascular Not Present- Chest Pain, Difficulty Breathing Lying Down, Leg Cramps, Palpitations, Rapid Heart Rate, Shortness of Breath and Swelling of Extremities. Gastrointestinal Present- Bloody Stool and Hemorrhoids. Not Present- Abdominal Pain, Bloating, Change in Bowel Habits, Chronic diarrhea, Constipation, Difficulty Swallowing, Excessive gas, Gets full quickly at meals, Indigestion, Nausea, Rectal Pain and Vomiting. Female Genitourinary Not Present-  Frequency, Nocturia, Painful Urination, Pelvic Pain and Urgency. Musculoskeletal Not Present- Back Pain, Joint Pain, Joint Stiffness, Muscle Pain, Muscle Weakness and Swelling of Extremities. Neurological Present- Weakness. Not Present- Decreased Memory, Fainting,  Headaches, Numbness, Seizures, Tingling, Tremor and Trouble walking. Psychiatric Not Present- Anxiety, Bipolar, Change in Sleep Pattern, Depression, Fearful and Frequent crying. Endocrine Not Present- Cold Intolerance, Excessive Hunger, Hair Changes, Heat Intolerance, Hot flashes and New Diabetes. Hematology Present- Easy Bruising. Not Present- Excessive bleeding, Gland problems, HIV and Persistent Infections.  Vitals Elbert Ewings CMA; 11/13/2014 1:34 PM) 11/13/2014 1:34 PM Weight: 151 lb Height: 69in Body Surface Area: 1.83 m Body Mass Index: 22.3 kg/m Temp.: 98.88F(Oral)  Pulse: 97 (Regular)  Resp.: 17 (Unlabored)  BP: 122/62 (Sitting, Left Arm, Standard)     Physical Exam Adin Hector MD; 11/13/2014 2:40 PM)  General Mental Status-Alert. General Appearance-Not in acute distress, Not Sickly. Orientation-Oriented X3. Hydration-Well hydrated. Voice-Normal.  Integumentary Global Assessment Upon inspection and palpation of skin surfaces of the - Axillae: non-tender, no inflammation or ulceration, no drainage. and Distribution of scalp and body hair is normal. General Characteristics Temperature - normal warmth is noted.  Head and Neck Head-normocephalic, atraumatic with no lesions or palpable masses. Face Global Assessment - atraumatic, no absence of expression. Neck Global Assessment - no abnormal movements, no bruit auscultated on the right, no bruit auscultated on the left, no decreased range of motion, non-tender. Trachea-midline. Thyroid Gland Characteristics - non-tender.  Eye Eyeball - Left-Extraocular movements intact, No Nystagmus. Eyeball - Right-Extraocular movements  intact, No Nystagmus. Cornea - Left-No Hazy. Cornea - Right-No Hazy. Sclera/Conjunctiva - Left-No scleral icterus, No Discharge. Sclera/Conjunctiva - Right-No scleral icterus, No Discharge. Pupil - Left-Direct reaction to light normal. Pupil - Right-Direct reaction to light normal.  ENMT Ears Pinna - Left - no drainage observed, no generalized tenderness observed. Right - no drainage observed, no generalized tenderness observed. Nose and Sinuses External Inspection of the Nose - no destructive lesion observed. Inspection of the nares - Left - quiet respiration. Right - quiet respiration. Mouth and Throat Lips - Upper Lip - no fissures observed, no pallor noted. Lower Lip - no fissures observed, no pallor noted. Nasopharynx - no discharge present. Oral Cavity/Oropharynx - Tongue - no dryness observed. Oral Mucosa - no cyanosis observed. Hypopharynx - no evidence of airway distress observed.  Chest and Lung Exam Inspection Movements - Normal and Symmetrical. Accessory muscles - No use of accessory muscles in breathing. Palpation Palpation of the chest reveals - Non-tender. Auscultation Breath sounds - Normal and Clear.  Cardiovascular Auscultation Rhythm - Regular. Murmurs & Other Heart Sounds - Auscultation of the heart reveals - No Murmurs and No Systolic Clicks.  Abdomen Inspection Inspection of the abdomen reveals - No Visible peristalsis and No Abnormal pulsations. Umbilicus - No Bleeding, No Urine drainage. Palpation/Percussion Palpation and Percussion of the abdomen reveal - Soft, Non Tender, No Rebound tenderness, No Rigidity (guarding) and No Cutaneous hyperesthesia. Note: Rather thin. Soft nontender nondistended. No mass   Female Genitourinary Sexual Maturity Tanner 5 - Adult hair pattern. Note: No vaginal bleeding nor discharge   Rectal Note: Deferred at this time given recent colonoscopy   Peripheral Vascular Upper Extremity Inspection - Left -  No Cyanotic nailbeds, Not Ischemic. Right - No Cyanotic nailbeds, Not Ischemic.  Neurologic Neurologic evaluation reveals -normal attention span and ability to concentrate, able to name objects and repeat phrases. Appropriate fund of knowledge , normal sensation and normal coordination. Mental Status Affect - not angry, not paranoid. Cranial Nerves-Normal Bilaterally. Gait-Normal.  Neuropsychiatric Mental status exam performed with findings of-able to articulate well with normal speech/language, rate, volume and coherence, thought content normal with ability to perform basic  computations and apply abstract reasoning and no evidence of hallucinations, delusions, obsessions or homicidal/suicidal ideation.  Musculoskeletal Global Assessment Spine, Ribs and Pelvis - no instability, subluxation or laxity. Right Upper Extremity - no instability, subluxation or laxity.  Lymphatic Head & Neck  General Head & Neck Lymphatics: Bilateral - Description - No Localized lymphadenopathy. Axillary  General Axillary Region: Bilateral - Description - No Localized lymphadenopathy. Femoral & Inguinal  Generalized Femoral & Inguinal Lymphatics: Left - Description - No Localized lymphadenopathy. Right - Description - No Localized lymphadenopathy.    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 Most likely etiology. Some enlarged lymph nodes but no evidence of carcinomatosis or metastatic disease. Possible carcinoid versus adenocarcinoma.  She would benefit from segmental intestinal resection. Hopefully small bowel to small bowel anastomosis. Less likely for ileocolonic but we will see. MIS approach. Probably do this laparoscopically with extracorporeal anastomosis. Discussed with the patient, her sister and family at length. They wish to proceed as soon as  possible.  Current Plans  Schedule for Surgery Written instructions provided  The anatomy & physiology of the digestive tract was discussed. The pathophysiology of the intestine was discussed. Natural history risks without surgery was discussed. I feel the risks of no intervention will lead to serious problems that outweigh the operative risks; therefore, I recommended a partial colectomy to remove the pathology. Minimally invasive (Robotic/Laparoscopic) & open techniques were discussed.  Risks such as bleeding, infection, abscess, leak, reoperation, possible ostomy, hernia, heart attack, death, and other risks were discussed. I noted a good likelihood this will help address the problem. Goals of post-operative recovery were discussed as well. Need for adequate nutrition, daily bowel regimen and healthy physical activity, to optimize recovery was noted as well. We will work to minimize complications. Educational materials were available as well. Questions were answered. The patient expresses understanding & wishes to proceed with surgery.  Discussed regular exercise with patient. Pt Education - [facility] Abdominal Surgery (Baldomero Mirarchi) Pt Education - [facility] Pain Control (Secret Kristensen) Pt Education - [facility] Good Bowel Health (Salma Walrond)  Adin Hector, M.D., F.A.C.S. Gastrointestinal and Minimally Invasive Surgery [facility] [address] [place], [place] [address] [phone] Main / Paging

## 2014-11-19 NOTE — Telephone Encounter (Signed)
Encounter by error

## 2014-11-21 ENCOUNTER — Other Ambulatory Visit (HOSPITAL_COMMUNITY): Payer: Self-pay | Admitting: *Deleted

## 2014-11-21 NOTE — Progress Notes (Signed)
ekg 09-24-14 epic

## 2014-11-21 NOTE — Patient Instructions (Addendum)
Christy Wade  11/21/2014   Your procedure is scheduled on: Friday May 13th, 2016  Report to [facility] Main  Entrance and follow signs to               [place] at 1045 AM.  Call this number if you have problems the morning of surgery [phone]   Remember: ONLY 1 PERSON MAY GO WITH YOU TO SHORT STAY TO GET  READY MORNING OF [place].  Do not eat food or drink liquids :After Midnight.     Take these medicines the morning of surgery with A SIP OF WATER: Synthroid, eye drop                               You may not have any metal on your body including hair pins and              piercings  Do not wear jewelry, make-up, lotions, powders or perfumes., deodorant.               Do not wear nail polish.  Do not shave  48 hours prior to surgery.     Do not bring valuables to the hospital. [place].  Contacts, dentures or bridgework may not be worn into surgery.  Leave suitcase in the car. After surgery it may be brought to your room.     Special Instructions: coughing and deep breathing exercises, leg exercises  WHAT IS A BLOOD TRANSFUSION? Blood Transfusion Information  A transfusion is the replacement of blood or some of its parts. Blood is made up of multiple cells which provide different functions.  Red blood cells carry oxygen and are used for blood loss replacement.  White blood cells fight against infection.  Platelets control bleeding.  Plasma helps clot blood.  Other blood products are available for specialized needs, such as hemophilia or other clotting disorders. BEFORE THE TRANSFUSION  Who gives blood for transfusions?   Healthy volunteers who are fully evaluated to make sure their blood is safe. This is blood bank blood. Transfusion therapy is the safest it has ever been in the practice of medicine. Before blood is taken from a donor, a complete history is taken to make sure that  person has no history of diseases nor engages in risky social behavior (examples are intravenous drug use or sexual activity with multiple partners). The donor's travel history is screened to minimize risk of transmitting infections, such as malaria. The donated blood is tested for signs of infectious diseases, such as HIV and hepatitis. The blood is then tested to be sure it is compatible with you in order to minimize the chance of a transfusion reaction. If you or a relative donates blood, this is often done in anticipation of surgery and is not appropriate for emergency situations. It takes many days to process the donated blood. RISKS AND COMPLICATIONS Although transfusion therapy is very safe and saves many lives, the main dangers of transfusion include:   Getting an infectious disease.  Developing a transfusion reaction. This is an allergic reaction to something in the blood you were given. Every precaution is taken to prevent this. The decision to have a blood transfusion has been considered carefully by your  caregiver before blood is given. Blood is not given unless the benefits outweigh the risks. AFTER THE TRANSFUSION  Right after receiving a blood transfusion, you will usually feel much better and more energetic. This is especially true if your red blood cells have gotten low (anemic). The transfusion raises the level of the red blood cells which carry oxygen, and this usually causes an energy increase.  The nurse administering the transfusion will monitor you carefully for complications. HOME CARE INSTRUCTIONS  No special instructions are needed after a transfusion. You may find your energy is better. Speak with your caregiver about any limitations on activity for underlying diseases you may have. SEEK MEDICAL CARE IF:   Your condition is not improving after your transfusion.  You develop redness or irritation at the intravenous (IV) site. SEEK IMMEDIATE MEDICAL CARE IF:  Any of the  following symptoms occur over the next 12 hours:  Shaking chills.  You have a temperature by mouth above 102 F (38.9 C), not controlled by medicine.  Chest, back, or muscle pain.  People around you feel you are not acting correctly or are confused.  Shortness of breath or difficulty breathing.  Dizziness and fainting.  You get a rash or develop hives.  You have a decrease in urine output.  Your urine turns a dark color or changes to pink, red, or brown. Any of the following symptoms occur over the next 10 days:  You have a temperature by mouth above 102 F (38.9 C), not controlled by medicine.  Shortness of breath.  Weakness after normal activity.  The white part of the eye turns yellow (jaundice).  You have a decrease in the amount of urine or are urinating less often.  Your urine turns a dark color or changes to pink, red, or brown. Document Released: 07/02/2000 Document Revised: 09/27/2011 Document Reviewed: 02/19/2008 ExitCare Patient Information 2014 [place].  _______________________________________________________________________              Please read over the following fact sheets you were given: _____________________________________________________________________             [facility] - Preparing for Surgery Before surgery, you can play an important role.  Because skin is not sterile, your skin needs to be as free of germs as possible.  You can reduce the number of germs on your skin by washing with CHG (chlorahexidine gluconate) soap before surgery.  CHG is an antiseptic cleaner which kills germs and bonds with the skin to continue killing germs even after washing. Please DO NOT use if you have an allergy to CHG or antibacterial soaps.  If your skin becomes reddened/irritated stop using the CHG and inform your nurse when you arrive at Short Stay. Do not shave (including legs and underarms) for at least 48 hours prior to the first CHG shower.   You may shave your face/neck. Please follow these instructions carefully:  1.  Shower with CHG Soap the night before surgery and the  morning of Surgery.  2.  If you choose to wash your hair, wash your hair first as usual with your  normal  shampoo.  3.  After you shampoo, rinse your hair and body thoroughly to remove the  shampoo.                           4.  Use CHG as you would any other liquid soap.  You can apply chg directly  to  the skin and wash                       Gently with a scrungie or clean washcloth.  5.  Apply the CHG Soap to your body ONLY FROM THE NECK DOWN.   Do not use on face/ open                           Wound or open sores. Avoid contact with eyes, ears mouth and genitals (private parts).                       Wash face,  Genitals (private parts) with your normal soap.             6.  Wash thoroughly, paying special attention to the area where your surgery  will be performed.  7.  Thoroughly rinse your body with warm water from the neck down.  8.  DO NOT shower/wash with your normal soap after using and rinsing off  the CHG Soap.                9.  Pat yourself dry with a clean towel.            10.  Wear clean pajamas.            11.  Place clean sheets on your bed the night of your first shower and do not  sleep with pets. Day of Surgery : Do not apply any lotions/deodorants the morning of surgery.  Please wear clean clothes to the hospital/surgery center.  FAILURE TO FOLLOW THESE INSTRUCTIONS MAY RESULT IN THE CANCELLATION OF YOUR SURGERY PATIENT SIGNATURE_________________________________  NURSE SIGNATURE__________________________________  ________________________________________________________________________

## 2014-11-25 ENCOUNTER — Encounter (HOSPITAL_COMMUNITY)
Admission: RE | Admit: 2014-11-25 | Discharge: 2014-11-25 | Disposition: A | Discharge status: Home / Self Care | Payer: Medicare PPO | Source: Ambulatory Visit | Attending: Surgery | Admitting: Surgery

## 2014-11-25 ENCOUNTER — Encounter (HOSPITAL_COMMUNITY): Payer: Self-pay

## 2014-11-25 DIAGNOSIS — R1909 Other intra-abdominal and pelvic swelling, mass and lump: Secondary | ICD-10-CM | POA: Diagnosis not present

## 2014-11-25 DIAGNOSIS — Z01812 Encounter for preprocedural laboratory examination: Secondary | ICD-10-CM | POA: Insufficient documentation

## 2014-11-25 HISTORY — DX: Other specified postprocedural states: R11.2

## 2014-11-25 HISTORY — DX: Other specified postprocedural states: Z98.890

## 2014-11-25 HISTORY — DX: Hypothyroidism, unspecified: E03.9

## 2014-11-25 LAB — CBC
HEMATOCRIT: 31.6 % — AB (ref 36.0–46.0)
HEMOGLOBIN: 10.3 g/dL — AB (ref 12.0–15.0)
MCH: 29.2 pg (ref 26.0–34.0)
MCHC: 32.6 g/dL (ref 30.0–36.0)
MCV: 89.5 fL (ref 78.0–100.0)
PLATELETS: 190 10*3/uL (ref 150–400)
RBC: 3.53 MIL/uL — AB (ref 3.87–5.11)
RDW: 16.9 % — ABNORMAL HIGH (ref 11.5–15.5)
WBC: 4.1 10*3/uL (ref 4.0–10.5)

## 2014-11-25 LAB — BASIC METABOLIC PANEL
Anion gap: 3 — ABNORMAL LOW (ref 5–15)
BUN: 16 mg/dL (ref 6–20)
CHLORIDE: 105 mmol/L (ref 101–111)
CO2: 24 mmol/L (ref 22–32)
CREATININE: 0.93 mg/dL (ref 0.44–1.00)
Calcium: 8.7 mg/dL — ABNORMAL LOW (ref 8.9–10.3)
GFR calc Af Amer: >60 mL/min (ref >60)
GFR calc non Af Amer: 58 mL/min — ABNORMAL LOW (ref >60)
Glucose, Bld: 94 mg/dL (ref 70–99)
POTASSIUM: 4.8 mmol/L (ref 3.5–5.1)
Sodium: 132 mmol/L — ABNORMAL LOW (ref 135–145)

## 2014-11-25 LAB — ABO/RH: ABO/RH(D): A POS

## 2014-11-29 ENCOUNTER — Encounter (HOSPITAL_COMMUNITY)
Admission: RE | Disposition: A | Discharge status: Home / Self Care | Payer: Self-pay | Source: Ambulatory Visit | Attending: Surgery

## 2014-11-29 ENCOUNTER — Inpatient Hospital Stay (HOSPITAL_COMMUNITY)
Admission: RE | Admit: 2014-11-29 | Discharge: 2014-12-02 | DRG: 349 | Disposition: A | Discharge status: Home / Self Care | Payer: Medicare PPO | Source: Ambulatory Visit | Attending: Surgery | Admitting: Surgery

## 2014-11-29 ENCOUNTER — Encounter (HOSPITAL_COMMUNITY): Payer: Self-pay | Admitting: Anesthesiology

## 2014-11-29 ENCOUNTER — Inpatient Hospital Stay (HOSPITAL_COMMUNITY): Payer: Medicare PPO | Admitting: Anesthesiology

## 2014-11-29 DIAGNOSIS — Z803 Family history of malignant neoplasm of breast: Secondary | ICD-10-CM | POA: Diagnosis not present

## 2014-11-29 DIAGNOSIS — R531 Weakness: Secondary | ICD-10-CM | POA: Diagnosis not present

## 2014-11-29 DIAGNOSIS — K649 Unspecified hemorrhoids: Secondary | ICD-10-CM | POA: Diagnosis not present

## 2014-11-29 DIAGNOSIS — K449 Diaphragmatic hernia without obstruction or gangrene: Secondary | ICD-10-CM | POA: Diagnosis present

## 2014-11-29 DIAGNOSIS — Z7982 Long term (current) use of aspirin: Secondary | ICD-10-CM | POA: Diagnosis not present

## 2014-11-29 DIAGNOSIS — Z833 Family history of diabetes mellitus: Secondary | ICD-10-CM

## 2014-11-29 DIAGNOSIS — I10 Essential (primary) hypertension: Secondary | ICD-10-CM | POA: Diagnosis present

## 2014-11-29 DIAGNOSIS — D1339 Benign neoplasm of other parts of small intestine: Principal | ICD-10-CM | POA: Diagnosis present

## 2014-11-29 DIAGNOSIS — Z882 Allergy status to sulfonamides status: Secondary | ICD-10-CM

## 2014-11-29 DIAGNOSIS — Z90710 Acquired absence of both cervix and uterus: Secondary | ICD-10-CM

## 2014-11-29 DIAGNOSIS — Z885 Allergy status to narcotic agent status: Secondary | ICD-10-CM

## 2014-11-29 DIAGNOSIS — E039 Hypothyroidism, unspecified: Secondary | ICD-10-CM | POA: Diagnosis present

## 2014-11-29 DIAGNOSIS — R591 Generalized enlarged lymph nodes: Secondary | ICD-10-CM | POA: Diagnosis not present

## 2014-11-29 DIAGNOSIS — D500 Iron deficiency anemia secondary to blood loss (chronic): Secondary | ICD-10-CM | POA: Diagnosis present

## 2014-11-29 DIAGNOSIS — R262 Difficulty in walking, not elsewhere classified: Secondary | ICD-10-CM | POA: Diagnosis not present

## 2014-11-29 DIAGNOSIS — R339 Retention of urine, unspecified: Secondary | ICD-10-CM | POA: Diagnosis not present

## 2014-11-29 DIAGNOSIS — Z79899 Other long term (current) drug therapy: Secondary | ICD-10-CM | POA: Diagnosis not present

## 2014-11-29 DIAGNOSIS — D379 Neoplasm of uncertain behavior of digestive organ, unspecified: Secondary | ICD-10-CM | POA: Diagnosis not present

## 2014-11-29 DIAGNOSIS — Z823 Family history of stroke: Secondary | ICD-10-CM

## 2014-11-29 DIAGNOSIS — Z808 Family history of malignant neoplasm of other organs or systems: Secondary | ICD-10-CM | POA: Diagnosis not present

## 2014-11-29 DIAGNOSIS — D490 Neoplasm of unspecified behavior of digestive system: Secondary | ICD-10-CM | POA: Diagnosis present

## 2014-11-29 DIAGNOSIS — M810 Age-related osteoporosis without current pathological fracture: Secondary | ICD-10-CM | POA: Diagnosis present

## 2014-11-29 DIAGNOSIS — K2970 Gastritis, unspecified, without bleeding: Secondary | ICD-10-CM | POA: Diagnosis present

## 2014-11-29 DIAGNOSIS — R29898 Other symptoms and signs involving the musculoskeletal system: Secondary | ICD-10-CM

## 2014-11-29 DIAGNOSIS — Z79891 Long term (current) use of opiate analgesic: Secondary | ICD-10-CM | POA: Diagnosis not present

## 2014-11-29 DIAGNOSIS — K639 Disease of intestine, unspecified: Secondary | ICD-10-CM | POA: Diagnosis present

## 2014-11-29 HISTORY — PX: LAPAROSCOPIC SMALL BOWEL RESECTION: SHX5929

## 2014-11-29 LAB — TYPE AND SCREEN
ABO/RH(D): A POS
Antibody Screen: NEGATIVE

## 2014-11-29 SURGERY — EXCISION, SMALL INTESTINE, LAPAROSCOPIC
Anesthesia: General | Site: Abdomen

## 2014-11-29 MED ORDER — FENTANYL CITRATE (PF) 250 MCG/5ML IJ SOLN
INTRAMUSCULAR | Status: AC
  Filled 2014-11-29: qty 5

## 2014-11-29 MED ORDER — CETYLPYRIDINIUM CHLORIDE 0.05 % MT LIQD
7.0000 mL | Freq: Two times a day (BID) | OROMUCOSAL | Status: DC
  Administered 2014-11-30 – 2014-12-01 (×4): 7 mL via OROMUCOSAL

## 2014-11-29 MED ORDER — PHENOL 1.4 % MT LIQD: 2.0000 | OROMUCOSAL | Status: DC | PRN

## 2014-11-29 MED ORDER — KETOROLAC TROMETHAMINE 30 MG/ML IJ SOLN
INTRAMUSCULAR | Status: AC
  Filled 2014-11-29: qty 1

## 2014-11-29 MED ORDER — METOCLOPRAMIDE HCL 5 MG/ML IJ SOLN
INTRAMUSCULAR | Status: AC
Start: 2014-11-29 — End: 2014-11-29
  Filled 2014-11-29: qty 2

## 2014-11-29 MED ORDER — ETOMIDATE 2 MG/ML IV SOLN
INTRAVENOUS | Status: AC
  Filled 2014-11-29: qty 10

## 2014-11-29 MED ORDER — DEXAMETHASONE SODIUM PHOSPHATE 10 MG/ML IJ SOLN
INTRAMUSCULAR | Status: DC | PRN
  Administered 2014-11-29: 10 mg via INTRAVENOUS

## 2014-11-29 MED ORDER — NEOSTIGMINE METHYLSULFATE 10 MG/10ML IV SOLN
INTRAVENOUS | Status: AC
  Filled 2014-11-29: qty 1

## 2014-11-29 MED ORDER — SODIUM CHLORIDE 0.9 % IJ SOLN
INTRAMUSCULAR | Status: AC
  Filled 2014-11-29: qty 50

## 2014-11-29 MED ORDER — HYDROMORPHONE HCL 1 MG/ML IJ SOLN
0.5000 mg | INTRAMUSCULAR | Status: DC | PRN
  Administered 2014-11-30 (×3): 1 mg via INTRAVENOUS
  Filled 2014-11-29 (×3): qty 1

## 2014-11-29 MED ORDER — ALUM & MAG HYDROXIDE-SIMETH 200-200-20 MG/5ML PO SUSP: 30.0000 mL | Freq: Four times a day (QID) | ORAL | Status: DC | PRN

## 2014-11-29 MED ORDER — SACCHAROMYCES BOULARDII 250 MG PO CAPS
250.0000 mg | ORAL_CAPSULE | Freq: Two times a day (BID) | ORAL | Status: DC
  Administered 2014-11-29 – 2014-12-02 (×6): 250 mg via ORAL
  Filled 2014-11-29 (×7): qty 1

## 2014-11-29 MED ORDER — PROMETHAZINE HCL 25 MG/ML IJ SOLN
INTRAMUSCULAR | Status: AC
  Filled 2014-11-29: qty 1

## 2014-11-29 MED ORDER — ONDANSETRON HCL 4 MG/2ML IJ SOLN
INTRAMUSCULAR | Status: AC
  Filled 2014-11-29: qty 2

## 2014-11-29 MED ORDER — TRAMADOL HCL 50 MG PO TABS: 50.0000 mg | ORAL_TABLET | Freq: Four times a day (QID) | ORAL | Status: DC | PRN

## 2014-11-29 MED ORDER — LIDOCAINE HCL (CARDIAC) 20 MG/ML IV SOLN
INTRAVENOUS | Status: AC
  Filled 2014-11-29: qty 5

## 2014-11-29 MED ORDER — ACETAMINOPHEN 500 MG PO TABS
1000.0000 mg | ORAL_TABLET | Freq: Three times a day (TID) | ORAL | Status: DC
  Administered 2014-11-29 – 2014-12-02 (×8): 1000 mg via ORAL
  Filled 2014-11-29 (×10): qty 2

## 2014-11-29 MED ORDER — PROPOFOL 10 MG/ML IV BOLUS
INTRAVENOUS | Status: AC
  Filled 2014-11-29: qty 20

## 2014-11-29 MED ORDER — SODIUM CHLORIDE 0.9 % IV SOLN
INTRAVENOUS | Status: DC
  Administered 2014-11-29: 17:00:00 via INTRAVENOUS
  Administered 2014-11-30: 1000 mL via INTRAVENOUS

## 2014-11-29 MED ORDER — HYDROMORPHONE HCL 1 MG/ML IJ SOLN
INTRAMUSCULAR | Status: AC
  Filled 2014-11-29: qty 1

## 2014-11-29 MED ORDER — BUPIVACAINE 0.25 % ON-Q PUMP DUAL CATH 300 ML
300.0000 mL | INJECTION | Freq: Once | Status: AC
  Administered 2014-11-29: 300 mL
  Filled 2014-11-29: qty 300

## 2014-11-29 MED ORDER — NEOSTIGMINE METHYLSULFATE 10 MG/10ML IV SOLN
INTRAVENOUS | Status: DC | PRN
  Administered 2014-11-29: 4 mg via INTRAVENOUS

## 2014-11-29 MED ORDER — PROMETHAZINE HCL 25 MG/ML IJ SOLN
6.2500 mg | INTRAMUSCULAR | Status: DC | PRN
  Administered 2014-11-30: 12.5 mg via INTRAVENOUS
  Filled 2014-11-29: qty 1

## 2014-11-29 MED ORDER — DIPHENHYDRAMINE HCL 50 MG/ML IJ SOLN: 12.5000 mg | Freq: Four times a day (QID) | INTRAMUSCULAR | Status: DC | PRN

## 2014-11-29 MED ORDER — BUPIVACAINE-EPINEPHRINE 0.25% -1:200000 IJ SOLN
INTRAMUSCULAR | Status: AC
  Filled 2014-11-29: qty 1

## 2014-11-29 MED ORDER — CISATRACURIUM BESYLATE 20 MG/10ML IV SOLN
INTRAVENOUS | Status: AC
  Filled 2014-11-29: qty 10

## 2014-11-29 MED ORDER — METOPROLOL TARTRATE 1 MG/ML IV SOLN
5.0000 mg | Freq: Four times a day (QID) | INTRAVENOUS | Status: DC
  Administered 2014-11-30 – 2014-12-01 (×5): 5 mg via INTRAVENOUS
  Filled 2014-11-29 (×15): qty 5

## 2014-11-29 MED ORDER — LEVOTHYROXINE SODIUM 50 MCG PO TABS
50.0000 ug | ORAL_TABLET | Freq: Every day | ORAL | Status: DC
  Administered 2014-11-30 – 2014-12-02 (×3): 50 ug via ORAL
  Filled 2014-11-29 (×4): qty 1

## 2014-11-29 MED ORDER — FENTANYL CITRATE (PF) 100 MCG/2ML IJ SOLN: 25.0000 ug | INTRAMUSCULAR | Status: DC | PRN

## 2014-11-29 MED ORDER — GLYCOPYRROLATE 0.2 MG/ML IJ SOLN
INTRAMUSCULAR | Status: AC
  Filled 2014-11-29: qty 3

## 2014-11-29 MED ORDER — LACTATED RINGERS IV SOLN
INTRAVENOUS | Status: DC
  Administered 2014-11-29 (×2): via INTRAVENOUS
  Administered 2014-11-29: 1000 mL via INTRAVENOUS

## 2014-11-29 MED ORDER — PHENYLEPHRINE 40 MCG/ML (10ML) SYRINGE FOR IV PUSH (FOR BLOOD PRESSURE SUPPORT)
PREFILLED_SYRINGE | INTRAVENOUS | Status: AC
  Filled 2014-11-29: qty 10

## 2014-11-29 MED ORDER — CHLORHEXIDINE GLUCONATE 0.12 % MT SOLN
15.0000 mL | Freq: Two times a day (BID) | OROMUCOSAL | Status: DC
Start: 2014-11-29 — End: 2014-12-02
  Administered 2014-11-29 – 2014-12-02 (×6): 15 mL via OROMUCOSAL
  Filled 2014-11-29 (×8): qty 15

## 2014-11-29 MED ORDER — ETOMIDATE 2 MG/ML IV SOLN
INTRAVENOUS | Status: DC | PRN
  Administered 2014-11-29: 16 mg via INTRAVENOUS

## 2014-11-29 MED ORDER — ROCURONIUM BROMIDE 100 MG/10ML IV SOLN
INTRAVENOUS | Status: DC | PRN
  Administered 2014-11-29 (×2): 5 mg via INTRAVENOUS
  Administered 2014-11-29: 40 mg via INTRAVENOUS

## 2014-11-29 MED ORDER — ALVIMOPAN 12 MG PO CAPS: 12.0000 mg | ORAL_CAPSULE | Freq: Two times a day (BID) | ORAL | Status: DC

## 2014-11-29 MED ORDER — ONDANSETRON HCL 4 MG/2ML IJ SOLN
4.0000 mg | Freq: Four times a day (QID) | INTRAMUSCULAR | Status: DC | PRN
  Administered 2014-11-29 – 2014-11-30 (×2): 4 mg via INTRAVENOUS
  Filled 2014-11-29 (×2): qty 2

## 2014-11-29 MED ORDER — CEFOTETAN DISODIUM-DEXTROSE 2-2.08 GM-% IV SOLR
INTRAVENOUS | Status: AC
  Filled 2014-11-29: qty 50

## 2014-11-29 MED ORDER — ONDANSETRON HCL 4 MG PO TABS: 4.0000 mg | ORAL_TABLET | Freq: Four times a day (QID) | ORAL | Status: DC | PRN

## 2014-11-29 MED ORDER — PHENYLEPHRINE HCL 10 MG/ML IJ SOLN
INTRAMUSCULAR | Status: DC | PRN
  Administered 2014-11-29 (×2): 80 ug via INTRAVENOUS
  Administered 2014-11-29: 40 ug via INTRAVENOUS

## 2014-11-29 MED ORDER — LIP MEDEX EX OINT
1.0000 "application " | TOPICAL_OINTMENT | Freq: Two times a day (BID) | CUTANEOUS | Status: DC
  Administered 2014-11-29 – 2014-12-02 (×4): 1 via TOPICAL
  Filled 2014-11-29: qty 7

## 2014-11-29 MED ORDER — HYDROMORPHONE HCL 1 MG/ML IJ SOLN
0.2500 mg | INTRAMUSCULAR | Status: DC | PRN
  Administered 2014-11-29 (×4): 0.5 mg via INTRAVENOUS

## 2014-11-29 MED ORDER — ADULT MULTIVITAMIN W/MINERALS CH
1.0000 | ORAL_TABLET | Freq: Every day | ORAL | Status: DC
  Administered 2014-12-02: 1 via ORAL
  Filled 2014-11-29 (×3): qty 1

## 2014-11-29 MED ORDER — MEPERIDINE HCL 50 MG/ML IJ SOLN: 6.2500 mg | INTRAMUSCULAR | Status: DC | PRN

## 2014-11-29 MED ORDER — BUPIVACAINE LIPOSOME 1.3 % IJ SUSP
20.0000 mL | Freq: Once | INTRAMUSCULAR | Status: DC
  Filled 2014-11-29: qty 20

## 2014-11-29 MED ORDER — FENTANYL CITRATE (PF) 100 MCG/2ML IJ SOLN
INTRAMUSCULAR | Status: DC | PRN
  Administered 2014-11-29 (×3): 50 ug via INTRAVENOUS
  Administered 2014-11-29: 100 ug via INTRAVENOUS

## 2014-11-29 MED ORDER — DEXAMETHASONE SODIUM PHOSPHATE 10 MG/ML IJ SOLN
INTRAMUSCULAR | Status: AC
  Filled 2014-11-29: qty 1

## 2014-11-29 MED ORDER — ONDANSETRON HCL 4 MG/2ML IJ SOLN
INTRAMUSCULAR | Status: DC | PRN
  Administered 2014-11-29: 4 mg via INTRAVENOUS

## 2014-11-29 MED ORDER — CEFOTETAN DISODIUM-DEXTROSE 2-2.08 GM-% IV SOLR
2.0000 g | INTRAVENOUS | Status: AC
  Administered 2014-11-29: 2 g via INTRAVENOUS

## 2014-11-29 MED ORDER — MENTHOL 3 MG MT LOZG: 1.0000 | LOZENGE | OROMUCOSAL | Status: DC | PRN

## 2014-11-29 MED ORDER — LACTATED RINGERS IV BOLUS (SEPSIS): 1000.0000 mL | Freq: Three times a day (TID) | INTRAVENOUS | Status: AC | PRN

## 2014-11-29 MED ORDER — POLYVINYL ALCOHOL 1.4 % OP SOLN: 1.0000 [drp] | Freq: Every day | OPHTHALMIC | Status: DC | PRN

## 2014-11-29 MED ORDER — HEPARIN SODIUM (PORCINE) 5000 UNIT/ML IJ SOLN
5000.0000 [IU] | Freq: Three times a day (TID) | INTRAMUSCULAR | Status: DC
  Administered 2014-11-30 – 2014-12-02 (×7): 5000 [IU] via SUBCUTANEOUS
  Filled 2014-11-29 (×10): qty 1

## 2014-11-29 MED ORDER — BUPIVACAINE-EPINEPHRINE 0.25% -1:200000 IJ SOLN
INTRAMUSCULAR | Status: DC | PRN
  Administered 2014-11-29: 50 mL

## 2014-11-29 MED ORDER — MAGIC MOUTHWASH
15.0000 mL | Freq: Four times a day (QID) | ORAL | Status: DC | PRN
  Filled 2014-11-29: qty 15

## 2014-11-29 MED ORDER — SUCCINYLCHOLINE CHLORIDE 20 MG/ML IJ SOLN
INTRAMUSCULAR | Status: DC | PRN
  Administered 2014-11-29: 100 mg via INTRAVENOUS

## 2014-11-29 MED ORDER — DIPHENHYDRAMINE HCL 12.5 MG/5ML PO ELIX: 12.5000 mg | ORAL_SOLUTION | Freq: Four times a day (QID) | ORAL | Status: DC | PRN

## 2014-11-29 MED ORDER — KETOROLAC TROMETHAMINE 15 MG/ML IJ SOLN
INTRAMUSCULAR | Status: DC | PRN
  Administered 2014-11-29: 15 mg via INTRAVENOUS

## 2014-11-29 MED ORDER — ZOLPIDEM TARTRATE 5 MG PO TABS: 5.0000 mg | ORAL_TABLET | Freq: Every evening | ORAL | Status: DC | PRN

## 2014-11-29 MED ORDER — DEXTROSE 5 % IV SOLN
2.0000 g | Freq: Two times a day (BID) | INTRAVENOUS | Status: AC
  Administered 2014-11-30: 2 g via INTRAVENOUS
  Filled 2014-11-29: qty 2

## 2014-11-29 MED ORDER — PROMETHAZINE HCL 25 MG/ML IJ SOLN
6.2500 mg | INTRAMUSCULAR | Status: AC | PRN
  Administered 2014-11-29 (×2): 6.25 mg via INTRAVENOUS

## 2014-11-29 MED ORDER — GLYCOPYRROLATE 0.2 MG/ML IJ SOLN
INTRAMUSCULAR | Status: DC | PRN
  Administered 2014-11-29: 0.6 mg via INTRAVENOUS

## 2014-11-29 MED ORDER — LIDOCAINE HCL (CARDIAC) 20 MG/ML IV SOLN
INTRAVENOUS | Status: DC | PRN
  Administered 2014-11-29: 50 mg via INTRAVENOUS

## 2014-11-29 MED ORDER — PROPOFOL 10 MG/ML IV BOLUS
INTRAVENOUS | Status: DC | PRN
  Administered 2014-11-29: 80 mg via INTRAVENOUS

## 2014-11-29 SURGICAL SUPPLY — 79 items
APPLIER CLIP 5 13 M/L LIGAMAX5 (MISCELLANEOUS)
APPLIER CLIP ROT 10 11.4 M/L (STAPLE)
APR CLP MED LRG 11.4X10 (STAPLE)
APR CLP MED LRG 5 ANG JAW (MISCELLANEOUS)
BLADE EXTENDED COATED 6.5IN (ELECTRODE) ×1 IMPLANT
BLADE HEX COATED 2.75 (ELECTRODE) ×4 IMPLANT
BLADE SURG SZ10 CARB STEEL (BLADE) ×2 IMPLANT
CABLE HIGH FREQUENCY MONO STRZ (ELECTRODE) ×2 IMPLANT
CATH KIT ON-Q SILVERSOAK 7.5 (CATHETERS) IMPLANT
CATH KIT ON-Q SILVERSOAK 7.5IN (CATHETERS) ×4 IMPLANT
CELLS DAT CNTRL 66122 CELL SVR (MISCELLANEOUS) IMPLANT
CLIP APPLIE 5 13 M/L LIGAMAX5 (MISCELLANEOUS) IMPLANT
CLIP APPLIE ROT 10 11.4 M/L (STAPLE) IMPLANT
COUNTER NEEDLE 20 DBL MAG RED (NEEDLE) ×2 IMPLANT
COVER MAYO STAND STRL (DRAPES) ×4 IMPLANT
DECANTER SPIKE VIAL GLASS SM (MISCELLANEOUS) ×2 IMPLANT
DRAIN CHANNEL 19F RND (DRAIN) IMPLANT
DRAPE LAPAROSCOPIC ABDOMINAL (DRAPES) ×2 IMPLANT
DRAPE SHEET LG 3/4 BI-LAMINATE (DRAPES) ×2 IMPLANT
DRAPE UTILITY XL STRL (DRAPES) ×2 IMPLANT
DRAPE WARM FLUID 44X44 (DRAPE) ×2 IMPLANT
DRSG OPSITE POSTOP 4X10 (GAUZE/BANDAGES/DRESSINGS) IMPLANT
DRSG OPSITE POSTOP 4X6 (GAUZE/BANDAGES/DRESSINGS) ×1 IMPLANT
DRSG OPSITE POSTOP 4X8 (GAUZE/BANDAGES/DRESSINGS) IMPLANT
DRSG TEGADERM 2-3/8X2-3/4 SM (GAUZE/BANDAGES/DRESSINGS) ×2 IMPLANT
DRSG TEGADERM 4X4.75 (GAUZE/BANDAGES/DRESSINGS) ×1 IMPLANT
ELECT REM PT RETURN 9FT ADLT (ELECTROSURGICAL) ×2
ELECTRODE REM PT RTRN 9FT ADLT (ELECTROSURGICAL) ×1 IMPLANT
ENDOLOOP SUT PDS II  0 18 (SUTURE)
ENDOLOOP SUT PDS II 0 18 (SUTURE) IMPLANT
GAUZE SPONGE 4X4 12PLY STRL (GAUZE/BANDAGES/DRESSINGS) ×1 IMPLANT
GLOVE ECLIPSE 8.0 STRL XLNG CF (GLOVE) ×4 IMPLANT
GLOVE INDICATOR 8.0 STRL GRN (GLOVE) ×12 IMPLANT
GOWN STRL REUS W/TWL XL LVL3 (GOWN DISPOSABLE) ×9 IMPLANT
KIT BASIN OR (CUSTOM PROCEDURE TRAY) ×2 IMPLANT
LEGGING LITHOTOMY PAIR STRL (DRAPES) ×1 IMPLANT
LUBRICANT JELLY K Y 4OZ (MISCELLANEOUS) IMPLANT
PENCIL BUTTON HOLSTER BLD 10FT (ELECTRODE) ×3 IMPLANT
PORT LAP GEL ALEXIS MED 5-9CM (MISCELLANEOUS) ×1 IMPLANT
RELOAD PROXIMATE 75MM BLUE (ENDOMECHANICALS) ×2 IMPLANT
RELOAD STAPLE 75 3.8 BLU REG (ENDOMECHANICALS) IMPLANT
RETRACTOR WND ALEXIS 18 MED (MISCELLANEOUS) IMPLANT
RTRCTR WOUND ALEXIS 18CM MED (MISCELLANEOUS)
SCISSORS LAP 5X35 DISP (ENDOMECHANICALS) ×2 IMPLANT
SEALER TISSUE G2 CVD JAW 35 (ENDOMECHANICALS) IMPLANT
SEALER TISSUE G2 CVD JAW 45CM (ENDOMECHANICALS)
SEALER TISSUE G2 STRG ARTC 35C (ENDOMECHANICALS) ×1 IMPLANT
SET IRRIG TUBING LAPAROSCOPIC (IRRIGATION / IRRIGATOR) ×2 IMPLANT
SLEEVE XCEL OPT CAN 5 100 (ENDOMECHANICALS) ×4 IMPLANT
SPONGE LAP 18X18 X RAY DECT (DISPOSABLE) ×2 IMPLANT
STAPLER GUN LINEAR PROX 60 (STAPLE) ×1 IMPLANT
STAPLER PROXIMATE 75MM BLUE (STAPLE) ×1 IMPLANT
STAPLER VISISTAT 35W (STAPLE) ×1 IMPLANT
SUCTION POOLE TIP (SUCTIONS) ×2 IMPLANT
SUT MNCRL AB 4-0 PS2 18 (SUTURE) ×1 IMPLANT
SUT PDS AB 1 CTX 36 (SUTURE) ×2 IMPLANT
SUT PDS AB 1 TP1 96 (SUTURE) IMPLANT
SUT PROLENE 0 CT 2 (SUTURE) IMPLANT
SUT SILK 2 0 (SUTURE) ×2
SUT SILK 2 0 SH CR/8 (SUTURE) ×2 IMPLANT
SUT SILK 2-0 18XBRD TIE 12 (SUTURE) ×1 IMPLANT
SUT SILK 3 0 (SUTURE) ×2
SUT SILK 3 0 SH CR/8 (SUTURE) ×2 IMPLANT
SUT SILK 3-0 18XBRD TIE 12 (SUTURE) ×1 IMPLANT
SUT VIC AB 4-0 PS2 27 (SUTURE) ×1 IMPLANT
SYR BULB IRRIGATION 50ML (SYRINGE) ×2 IMPLANT
SYS LAPSCP GELPORT 120MM (MISCELLANEOUS)
SYSTEM LAPSCP GELPORT 120MM (MISCELLANEOUS) IMPLANT
TAPE UMBILICAL COTTON 1/8X30 (MISCELLANEOUS) IMPLANT
TOWEL OR 17X26 10 PK STRL BLUE (TOWEL DISPOSABLE) ×4 IMPLANT
TOWEL OR NON WOVEN STRL DISP B (DISPOSABLE) ×4 IMPLANT
TRAY FOLEY W/METER SILVER 14FR (SET/KITS/TRAYS/PACK) ×2 IMPLANT
TRAY LAPAROSCOPIC (CUSTOM PROCEDURE TRAY) ×2 IMPLANT
TROCAR BLADELESS OPT 5 100 (ENDOMECHANICALS) ×2 IMPLANT
TROCAR XCEL NON-BLD 11X100MML (ENDOMECHANICALS) ×1 IMPLANT
TUBING CONNECTING 10 (TUBING) IMPLANT
TUBING FILTER THERMOFLATOR (ELECTROSURGICAL) ×2 IMPLANT
TUNNELER SHEATH ON-Q 16GX12 DP (PAIN MANAGEMENT) ×1 IMPLANT
YANKAUER SUCT BULB TIP 10FT TU (MISCELLANEOUS) ×4 IMPLANT

## 2014-11-29 NOTE — Interval H&P Note (Signed)
History and Physical Interval Note:  11/29/2014 1:47 PM  Christy Wade VONNA BRABSON  has presented today for surgery, with the diagnosis of Mass of Ileum with Anemia  The various methods of treatment have been discussed with the patient and family. After consideration of risks, benefits and other options for treatment, the patient has consented to  Procedure(s): LAPAROSCOPIC SMALL INTESTINE RESECTION (N/A) as a surgical intervention .  The patient's history has been reviewed, patient examined, no change in status, stable for surgery.  I have reviewed the patient's chart and labs.  Questions were answered to the patient's satisfaction.     Dnya Hickle C.

## 2014-11-29 NOTE — H&P (View-Only) (Signed)
Christy Wade 11/13/2014 1:31 PM Location: [facility] Patient #: [id number] DOB: May 20, 1936 Married / Language: Cleophus Molt / Race: White Female History of Present Illness Adin Hector MD; 11/13/2014 2:43 PM) Patient words: resection small bowel.  The patient is a 79 year old female who presents with a complaint of mass in intestine. Pleasant patient sent by her gastroenterologist Dr. Oretha Caprice. Concern for GI try loss anemia and mass in the ileum.  Pleasant elderly female. History of hysterectomy but no other surgeries. Was found to be anemic with hemoglobin around 8 on annual exam. Feeling somewhat fatigued. Sent to gastroenterology. Underwhelming upper and lower endoscopies. Mild hiatal hernia and gastritis. No Helicobacter pylori. Colonoscopy completely normal. CT scan showed mass in the mid ileum with thickening and possible lymphadenopathy. Suspicious for small bowel tumor. Surgical consultation requested to see if resection appropriate.  Patient can walk about 20 minutes without difficulty. Has a bowel movement about every day or so. She denies any abdominal pain. She's been on iron and her hemoglobin is now on the upper nines. She is here today with her sister who is a Marine scientist and other family member. No history of Crohn's/Ulcerative colitis. No inflammatory bowel disease. No irritable bowel syndrome. No family history of colon or bowel cancer carcinoid that she is aware of. Other Problems Elbert Ewings, CMA; 11/13/2014 1:31 PM) Hemorrhoids  Past Surgical History Elbert Ewings, CMA; 11/13/2014 1:31 PM) Appendectomy Hysterectomy (not due to cancer) - Partial Knee Surgery Right. Oral Surgery  Diagnostic Studies History Elbert Ewings, [place]; 11/13/2014 1:31 PM) Colonoscopy within last year Mammogram within last year Pap Smear >5 years ago  Allergies Elbert Ewings, CMA; 11/13/2014 1:33 PM) Codeine Phosphate *ANALGESICS - OPIOID* Sulfa 10 *OPHTHALMIC  AGENTS*  Medication History Elbert Ewings, CMA; 11/13/2014 1:34 PM) TraMADol HCl (50MG  Tablet, Oral) Active. Cyclobenzaprine HCl (5MG  Tablet, Oral) Active. Levothyroxine Sodium (50MCG Tablet, Oral) Active. Lisinopril (10MG  Tablet, Oral) Active. Multi-Minerals (Oral) Active. Aspirin (81MG  Tablet, Oral) Active. Medications Reconciled  Social History Elbert Ewings, [place]; 11/13/2014 1:31 PM) No alcohol use No caffeine use No drug use Tobacco use Never smoker.  Family History Elbert Ewings, [place]; 11/13/2014 1:31 PM) Breast Cancer Sister. Cancer Mother. Diabetes Mellitus Father. Thyroid problems Mother.  Pregnancy / Birth History Elbert Ewings, CMA; 11/13/2014 1:31 PM) Age at menarche 87 years. Age of menopause 44-55 Gravida 1 Maternal age 54-30 Para 1     Review of Systems Elbert Ewings CMA; 11/13/2014 1:31 PM) General Present- Fatigue. Not Present- Appetite Loss, Chills, Fever, Night Sweats, Weight Gain and Weight Loss. Skin Not Present- Change in Wart/Mole, Dryness, Hives, Jaundice, New Lesions, Non-Healing Wounds, Rash and Ulcer. HEENT Not Present- Earache, Hearing Loss, Hoarseness, Nose Bleed, Oral Ulcers, Ringing in the Ears, Seasonal Allergies, Sinus Pain, Sore Throat, Visual Disturbances, Wears glasses/contact lenses and Yellow Eyes. Respiratory Not Present- Bloody sputum, Chronic Cough, Difficulty Breathing, Snoring and Wheezing. Breast Not Present- Breast Mass, Breast Pain, Nipple Discharge and Skin Changes. Cardiovascular Not Present- Chest Pain, Difficulty Breathing Lying Down, Leg Cramps, Palpitations, Rapid Heart Rate, Shortness of Breath and Swelling of Extremities. Gastrointestinal Present- Bloody Stool and Hemorrhoids. Not Present- Abdominal Pain, Bloating, Change in Bowel Habits, Chronic diarrhea, Constipation, Difficulty Swallowing, Excessive gas, Gets full quickly at meals, Indigestion, Nausea, Rectal Pain and Vomiting. Female Genitourinary Not Present-  Frequency, Nocturia, Painful Urination, Pelvic Pain and Urgency. Musculoskeletal Not Present- Back Pain, Joint Pain, Joint Stiffness, Muscle Pain, Muscle Weakness and Swelling of Extremities. Neurological Present- Weakness. Not Present- Decreased Memory, Fainting,  Headaches, Numbness, Seizures, Tingling, Tremor and Trouble walking. Psychiatric Not Present- Anxiety, Bipolar, Change in Sleep Pattern, Depression, Fearful and Frequent crying. Endocrine Not Present- Cold Intolerance, Excessive Hunger, Hair Changes, Heat Intolerance, Hot flashes and New Diabetes. Hematology Present- Easy Bruising. Not Present- Excessive bleeding, Gland problems, HIV and Persistent Infections.  Vitals Elbert Ewings CMA; 11/13/2014 1:34 PM) 11/13/2014 1:34 PM Weight: 151 lb Height: 69in Body Surface Area: 1.83 m Body Mass Index: 22.3 kg/m Temp.: 98.26F(Oral)  Pulse: 97 (Regular)  Resp.: 17 (Unlabored)  BP: 122/62 (Sitting, Left Arm, Standard)     Physical Exam Adin Hector MD; 11/13/2014 2:40 PM)  General Mental Status-Alert. General Appearance-Not in acute distress, Not Sickly. Orientation-Oriented X3. Hydration-Well hydrated. Voice-Normal.  Integumentary Global Assessment Upon inspection and palpation of skin surfaces of the - Axillae: non-tender, no inflammation or ulceration, no drainage. and Distribution of scalp and body hair is normal. General Characteristics Temperature - normal warmth is noted.  Head and Neck Head-normocephalic, atraumatic with no lesions or palpable masses. Face Global Assessment - atraumatic, no absence of expression. Neck Global Assessment - no abnormal movements, no bruit auscultated on the right, no bruit auscultated on the left, no decreased range of motion, non-tender. Trachea-midline. Thyroid Gland Characteristics - non-tender.  Eye Eyeball - Left-Extraocular movements intact, No Nystagmus. Eyeball - Right-Extraocular movements  intact, No Nystagmus. Cornea - Left-No Hazy. Cornea - Right-No Hazy. Sclera/Conjunctiva - Left-No scleral icterus, No Discharge. Sclera/Conjunctiva - Right-No scleral icterus, No Discharge. Pupil - Left-Direct reaction to light normal. Pupil - Right-Direct reaction to light normal.  ENMT Ears Pinna - Left - no drainage observed, no generalized tenderness observed. Right - no drainage observed, no generalized tenderness observed. Nose and Sinuses External Inspection of the Nose - no destructive lesion observed. Inspection of the nares - Left - quiet respiration. Right - quiet respiration. Mouth and Throat Lips - Upper Lip - no fissures observed, no pallor noted. Lower Lip - no fissures observed, no pallor noted. Nasopharynx - no discharge present. Oral Cavity/Oropharynx - Tongue - no dryness observed. Oral Mucosa - no cyanosis observed. Hypopharynx - no evidence of airway distress observed.  Chest and Lung Exam Inspection Movements - Normal and Symmetrical. Accessory muscles - No use of accessory muscles in breathing. Palpation Palpation of the chest reveals - Non-tender. Auscultation Breath sounds - Normal and Clear.  Cardiovascular Auscultation Rhythm - Regular. Murmurs & Other Heart Sounds - Auscultation of the heart reveals - No Murmurs and No Systolic Clicks.  Abdomen Inspection Inspection of the abdomen reveals - No Visible peristalsis and No Abnormal pulsations. Umbilicus - No Bleeding, No Urine drainage. Palpation/Percussion Palpation and Percussion of the abdomen reveal - Soft, Non Tender, No Rebound tenderness, No Rigidity (guarding) and No Cutaneous hyperesthesia. Note: Rather thin. Soft nontender nondistended. No mass   Female Genitourinary Sexual Maturity Tanner 5 - Adult hair pattern. Note: No vaginal bleeding nor discharge   Rectal Note: Deferred at this time given recent colonoscopy   Peripheral Vascular Upper Extremity Inspection - Left -  No Cyanotic nailbeds, Not Ischemic. Right - No Cyanotic nailbeds, Not Ischemic.  Neurologic Neurologic evaluation reveals -normal attention span and ability to concentrate, able to name objects and repeat phrases. Appropriate fund of knowledge , normal sensation and normal coordination. Mental Status Affect - not angry, not paranoid. Cranial Nerves-Normal Bilaterally. Gait-Normal.  Neuropsychiatric Mental status exam performed with findings of-able to articulate well with normal speech/language, rate, volume and coherence, thought content normal with ability to perform basic  computations and apply abstract reasoning and no evidence of hallucinations, delusions, obsessions or homicidal/suicidal ideation.  Musculoskeletal Global Assessment Spine, Ribs and Pelvis - no instability, subluxation or laxity. Right Upper Extremity - no instability, subluxation or laxity.  Lymphatic Head & Neck  General Head & Neck Lymphatics: Bilateral - Description - No Localized lymphadenopathy. Axillary  General Axillary Region: Bilateral - Description - No Localized lymphadenopathy. Femoral & Inguinal  Generalized Femoral & Inguinal Lymphatics: Left - Description - No Localized lymphadenopathy. Right - Description - No Localized lymphadenopathy.    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 Most likely etiology. Some enlarged lymph nodes but no evidence of carcinomatosis or metastatic disease. Possible carcinoid versus adenocarcinoma.  She would benefit from segmental intestinal resection. Hopefully small bowel to small bowel anastomosis. Less likely for ileocolonic but we will see. MIS approach. Probably do this laparoscopically with extracorporeal anastomosis. Discussed with the patient, her sister and family at length. They wish to proceed as soon as  possible.  Current Plans  Schedule for Surgery Written instructions provided  The anatomy & physiology of the digestive tract was discussed. The pathophysiology of the intestine was discussed. Natural history risks without surgery was discussed. I feel the risks of no intervention will lead to serious problems that outweigh the operative risks; therefore, I recommended a partial colectomy to remove the pathology. Minimally invasive (Robotic/Laparoscopic) & open techniques were discussed.  Risks such as bleeding, infection, abscess, leak, reoperation, possible ostomy, hernia, heart attack, death, and other risks were discussed. I noted a good likelihood this will help address the problem. Goals of post-operative recovery were discussed as well. Need for adequate nutrition, daily bowel regimen and healthy physical activity, to optimize recovery was noted as well. We will work to minimize complications. Educational materials were available as well. Questions were answered. The patient expresses understanding & wishes to proceed with surgery.  Discussed regular exercise with patient. Pt Education - [facility] Abdominal Surgery (Dawnette Mione) Pt Education - [facility] Pain Control (Annalisia Ingber) Pt Education - [facility] Good Bowel Health (Azariel Banik)  Adin Hector, M.D., F.A.C.S. Gastrointestinal and Minimally Invasive Surgery [facility] [address], [place], [place] [address] [phone] Main / Paging

## 2014-11-29 NOTE — Op Note (Signed)
11/29/2014  3:58 PM  PATIENT:  Christy Wade  79 y.o. female  Patient Care Team: Dorena Cookey, MD as PCP - General  PRE-OPERATIVE DIAGNOSIS:  Mass of Ileum with Anemia  POST-OPERATIVE DIAGNOSIS: Mass of Ileum with Anemia & Lymphadenopathy  PROCEDURE:  Procedure(s): LAPAROSCOPIC - ASSISTED SMALL INTESTINE RESECTION  SURGEON:  Surgeon(s): Michael Boston, MD Alphonsa Overall, MD Assist  ANESTHESIA:   local and general  EBL:  Total I/O In: 2150 [I.V.:2150] Out: 450 [Urine:400; Blood:50]  Delay start of Pharmacological VTE agent (>24hrs) due to surgical blood loss or risk of bleeding:  no  DRAINS: none   SPECIMEN:  Ileum ( silk more proximal end)  DISPOSITION OF SPECIMEN:  PATHOLOGY  COUNTS:  YES  PLAN OF CARE: Admit to inpatient   PATIENT DISPOSITION:  PACU - hemodynamically stable.  INDICATION:    Pleasant active woman with anemia.  Panendoscopy revealed no mass but CT scan showed thickening of mid ileum with possible lymphadenopathy suspicious for tumor.  I recommended segmental resection:  The anatomy & physiology of the digestive tract was discussed.  The pathophysiology was discussed.  Natural history risks without surgery was discussed.   I worked to give an overview of the disease and the frequent need to have multispecialty involvement.  I feel the risks of no intervention will lead to serious problems that outweigh the operative risks; therefore, I recommended a partial colectomy to remove the pathology.  Laparoscopic & open techniques were discussed.   Risks such as bleeding, infection, abscess, leak, reoperation, possible ostomy, hernia, heart attack, death, and other risks were discussed.  I noted a good likelihood this will help address the problem.   Goals of post-operative recovery were discussed as well.  We will work to minimize complications.  An educational handout on the pathology was given as well.  Questions were answered.    The patient expresses  understanding & wishes to proceed with surgery.  OR FINDINGS:   Patient had thickened intraluminal mass of the mid ileum with some serosal changes and scarring.  About 2 feet proximal to the ileocecal valve at the mid ileum.  Obvious bulky lymphadenopathy as well.  No obvious metastatic disease on visceral parietal peritoneum or liver.  It is an ileoileal anastomosis that rests in the RLQ.  DESCRIPTION:   Informed consent was confirmed.  The patient underwent general anaesthesia without difficulty.  The patient was positioned with arms tucked & secured appropriately.  VTE prevention in place.  The patient's abdomen was clipped, prepped, & draped in a sterile fashion.  Surgical timeout confirmed our plan.  The patient was positioned in reverse Trendelenburg.  Abdominal entry was gained using optical entry technique in the left upper abdomen.  Entry was clean.  I induced carbon dioxide insufflation.  Camera inspection revealed no injury.  Extra ports were carefully placed under direct laparoscopic visualization.  I mobilized & reflected the greater omentum into the upper abdomen.  I was able to elevate the proximal colon to isolate the ileocolonic pedicle. i ranthe ileum more proximal E.  I encountered a fold between the ileal mesentery and the left fallopian tube.  I transected this band.  The potential in her internal hernia point but no obvious obstruction at this exam. About 2 feet proximal ileocecal valve I encountered obvious lymphadenopathy of the ileal mesentery and a thickened 4 x 3 cm region of the ileum.  Somewhat dilated.  Seem to intraluminal.  I ran more proximally into theligament of  Treitz.  Saw no other abnormalities.  There were no other adhesions in the abdominal cavity.  There is no evidence of any carcinomatosis on the [facility] omentum visceral or parietal peritoneum.  No obvious liver masses.  Gallbladder looked normal.  Adnexa looked appropriately atrophic but no other  abnormalities.  I then proceeded to mobilize the terminal ileum & proximal "right" colon in a lateral to medial fashion.  I mobilized the ileal mesentery off its retroperitoneal and pelvic attachments.  I mobilized the ascending colon off It is side wall attachments to the paracolic gutter and retroperitoneum.    I placed a wound protector through a periumbilical midline 6cm incision.  With that, I was able to eviserate the ileum.  I could isolate the pathology. I transected the mid and distal ileum using clamps more proximally.  It was just proximal to the ileocecal pedicle.  Took resections radially to excise all obvious lymphadenopathy.  The distal 15 cm of terminal ileum was spared. We he went ahead and proceeded with transection.  I kept a healthy pedicle on the ileal and ileocecal side.  We took the intervening mesentery using bipolar energy and occasional clamps and silk ties as well.  We assured hemostasis.   I did a side-to-side stapled anastomosis of ileum to ileum using a 54mm GIA stapler.  We then transected the specimen off (including the common bowel defect) using a TX stapler.  I closed off the common mesenteric defect using interrupted silk stitches to avoid any internal hernias.   We did reinspection of the abdomen.  Hemostasis was good.  Ureters, retroperitoneum, and bowel uninjured.  The anastomosis looked healthy.     We removed the wound protector of the Gelport & changed gown & gloves. The patient was re-draped.  We reinspected the abdomen.  Hemostasis was good.   No injury.  The anastomosis looked healthy.  I placed On-Q catheter and sheaths into the preperitoneal space under direct palpation.  I closed the 13mm port sites using Monocryl stitch and sterile dressing.  I closed the midline incision using #1 PDS running closure. I closed the skin with some interrupted Monocryl stitches. I placed antibiotic-soaked wicks in between those areas. I placed sterile dressing.  OnQ catheters placed  & sheaths peeled away.  Patient is being extubated go to recovery room. I discussed postop care with the patient in detail the office & in the holding area. Instructions are written. I updated the patient's status to the family.  Recommendations were made.  Questions were answered.  The family expressed understanding & appreciation.  Adin Hector, M.D., F.A.C.S. Gastrointestinal and Minimally Invasive Surgery [facility] [address], [place], [place] [address] [phone] Main / Paging

## 2014-11-29 NOTE — Anesthesia Postprocedure Evaluation (Signed)
  Anesthesia Post-op Note  Patient: Christy Wade  Procedure(s) Performed: Procedure(s) (LRB): LAPAROSCOPIC SMALL INTESTINE RESECTION (N/A)  Patient Location: PACU  Anesthesia Type: General  Level of Consciousness: awake and alert   Airway and Oxygen Therapy: Patient Spontanous Breathing  Post-op Pain: mild  Post-op Assessment: Post-op Vital signs reviewed, Patient's Cardiovascular Status Stable, Respiratory Function Stable, Patent Airway and No signs of Nausea or vomiting  Last Vitals:  Filed Vitals:   11/29/14 1640  BP:   Pulse: 57  Temp: 36.3 C  Resp: 18    Post-op Vital Signs: stable   Complications: No apparent anesthesia complications

## 2014-11-29 NOTE — Anesthesia Procedure Notes (Signed)
Procedure Name: Intubation Date/Time: 11/29/2014 2:02 PM Performed by: Noralyn Pick D Pre-anesthesia Checklist: Patient identified, Emergency Drugs available, Suction available and Patient being monitored Patient Re-evaluated:Patient Re-evaluated prior to inductionOxygen Delivery Method: Circle System Utilized Preoxygenation: Pre-oxygenation with 100% oxygen Intubation Type: IV induction Ventilation: Mask ventilation without difficulty Laryngoscope Size: Mac and 3 Tube type: Oral Tube size: 7.5 mm Number of attempts: 1 Airway Equipment and Method: Stylet and Oral airway Placement Confirmation: ETT inserted through vocal cords under direct vision,  positive ETCO2 and breath sounds checked- equal and bilateral Secured at: 21 cm Tube secured with: Tape Dental Injury: Teeth and Oropharynx as per pre-operative assessment

## 2014-11-29 NOTE — Anesthesia Preprocedure Evaluation (Signed)
Anesthesia Evaluation  Patient identified by MRN, date of birth, ID band Patient awake    Reviewed: Allergy & Precautions, NPO status , Patient's Chart, lab work & pertinent test results  History of Anesthesia Complications (+) PONV  Airway Mallampati: II  TM Distance: >3 FB Neck ROM: Full    Dental no notable dental hx.    Pulmonary neg pulmonary ROS,  breath sounds clear to auscultation  Pulmonary exam normal       Cardiovascular hypertension, Pt. on medications negative cardio ROS Normal cardiovascular examRhythm:Regular Rate:Normal     Neuro/Psych negative neurological ROS  negative psych ROS   GI/Hepatic negative GI ROS, Neg liver ROS,   Endo/Other  negative endocrine ROS  Renal/GU negative Renal ROS  negative genitourinary   Musculoskeletal negative musculoskeletal ROS (+)   Abdominal   Peds negative pediatric ROS (+)  Hematology negative hematology ROS (+)   Anesthesia Other Findings   Reproductive/Obstetrics negative OB ROS                             Anesthesia Physical Anesthesia Plan  ASA: II  Anesthesia Plan: General   Post-op Pain Management:    Induction: Intravenous  Airway Management Planned: Oral ETT  Additional Equipment:   Intra-op Plan:   Post-operative Plan: Extubation in OR  Informed Consent: I have reviewed the patients History and Physical, chart, labs and discussed the procedure including the risks, benefits and alternatives for the proposed anesthesia with the patient or authorized representative who has indicated his/her understanding and acceptance.   Dental advisory given  Plan Discussed with: CRNA  Anesthesia Plan Comments:         Anesthesia Quick Evaluation

## 2014-11-29 NOTE — Transfer of Care (Signed)
Immediate Anesthesia Transfer of Care Note  Patient: Christy Wade  Procedure(s) Performed: Procedure(s): LAPAROSCOPIC SMALL INTESTINE RESECTION (N/A)  Patient Location: PACU  Anesthesia Type:General  Level of Consciousness: awake, alert  and oriented  Airway & Oxygen Therapy: Patient Spontanous Breathing and Patient connected to face mask oxygen  Post-op Assessment: Report given to RN and Post -op Vital signs reviewed and stable  Post vital signs: Reviewed and stable  Last Vitals:  Filed Vitals:   11/29/14 1045  BP: 150/72  Pulse: 92  Temp: 36.6 C  Resp: 18    Complications: No apparent anesthesia complications

## 2014-11-29 NOTE — Progress Notes (Signed)
Entereg Criteria  Per P&T policy, Entereg post-op doses were discontinued due to patient not receiving pre-op dose.   Adrian Saran, PharmD, BCPS Pager [phone] 11/29/2014 5:23 PM

## 2014-11-30 LAB — CBC
HCT: 26.5 % — ABNORMAL LOW (ref 36.0–46.0)
HEMOGLOBIN: 8.7 g/dL — AB (ref 12.0–15.0)
MCH: 29.3 pg (ref 26.0–34.0)
MCHC: 32.8 g/dL (ref 30.0–36.0)
MCV: 89.2 fL (ref 78.0–100.0)
Platelets: 150 10*3/uL (ref 150–400)
RBC: 2.97 MIL/uL — ABNORMAL LOW (ref 3.87–5.11)
RDW: 16.2 % — ABNORMAL HIGH (ref 11.5–15.5)
WBC: 8 10*3/uL (ref 4.0–10.5)

## 2014-11-30 LAB — BASIC METABOLIC PANEL
Anion gap: 9 (ref 5–15)
BUN: 13 mg/dL (ref 6–20)
CHLORIDE: 99 mmol/L — AB (ref 101–111)
CO2: 20 mmol/L — ABNORMAL LOW (ref 22–32)
Calcium: 8 mg/dL — ABNORMAL LOW (ref 8.9–10.3)
Creatinine, Ser: 1.11 mg/dL — ABNORMAL HIGH (ref 0.44–1.00)
GFR calc Af Amer: 54 mL/min — ABNORMAL LOW (ref >60)
GFR, EST NON AFRICAN AMERICAN: 47 mL/min — AB (ref >60)
GLUCOSE: 116 mg/dL — AB (ref 65–99)
Potassium: 4.7 mmol/L (ref 3.5–5.1)
Sodium: 128 mmol/L — ABNORMAL LOW (ref 135–145)

## 2014-11-30 LAB — MAGNESIUM: Magnesium: 1.5 mg/dL — ABNORMAL LOW (ref 1.7–2.4)

## 2014-11-30 MED ORDER — BUPIVACAINE 0.25 % ON-Q PUMP DUAL CATH 300 ML: 300.0000 mL | INJECTION | Status: DC

## 2014-11-30 NOTE — Progress Notes (Signed)
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  Seems mild & ROM / strength WNL to me.  Walker for ambulation - PT/OT eval.   -hypothyroidism - levothyroxine  -HTN - metoprolol for now.  May add lisinopril in 1-2 days -VTE prophylaxis- SCDs, etc -mobilize as tolerated to help recovery  I updated the status of the patient to the patient, her RN, and the patient's daughter.  I made recommendations.  I answered questions.  Understanding & appreciation was expressed.   Adin Hector, M.D., F.A.C.S. Gastrointestinal and Minimally Invasive Surgery [facility] [address] [place], [place] [address] [phone] Main / Paging   11/30/2014  Subjective:  Pain minimal Felt left knee buckle last night w? Tingling - better now but not toally WNL Tol liquids - trying fulls No n/v  Objective:  Vital signs:  Filed Vitals:   11/30/14 0032 11/30/14 0127 11/30/14 0527 11/30/14 0632  BP: 116/70 144/84 145/68 128/60  Pulse: 60 73 61 60  Temp:  97.6 F (36.4 C) 97.6 F (36.4 C)   TempSrc:  Oral Oral   Resp:  16 16   Height:      Weight:      SpO2:  100% 98%     Last BM Date: 11/28/14  Intake/Output   Yesterday:  05/13 0701 - 05/14 0700 In: 2736.3 [I.V.:2736.3] Out: 1200 [Urine:1150; Blood:50] This shift:  Total I/O In: -  Out: 450 [Urine:450]  Bowel function:  Flatus: n  BM: n  Drain: n/a  Physical Exam:  General: Pt awake/alert/oriented  x4 in no acute distress Eyes: PERRL, normal EOM.  Sclera clear.  No icterus Neuro: CN II-XII intact w/o focal sensory/motor deficits.  Flexing hip/kness/ankles w/o difficulty.  No weakness Lymph: No head/neck/groin lymphadenopathy Psych:  No delerium/psychosis/paranoia HENT: Normocephalic, Mucus membranes moist.  No thrush Neck: Supple, No tracheal deviation Chest: No chest wall pain w good excursion CV:  Pulses intact.  Regular rhythm MS: Normal AROM mjr joints.  No obvious deformity Abdomen: Soft.  Nondistended.  Mildly tender at incisions only.  No evidence of peritonitis.  No incarcerated hernias. Ext:  SCDs BLE.  No mjr edema.  No cyanosis.   Skin: No petechiae / purpura  Results:   Labs: Results for orders placed or performed during the hospital encounter of 11/29/14 (from the past 48 hour(s))  Basic metabolic panel     Status: Abnormal   Collection Time: 11/30/14  4:40 AM  Result Value Ref Range   Sodium 128 (L) 135 - 145 mmol/L   Potassium 4.7 3.5 - 5.1 mmol/L   Chloride 99 (L) 101 - 111 mmol/L   CO2 20 (L) 22 - 32 mmol/L   Glucose, Bld 116 (H) 65 - 99 mg/dL   BUN 13 6 - 20 mg/dL   Creatinine, Ser 1.11 (H) 0.44 - 1.00 mg/dL   Calcium 8.0 (L) 8.9 - 10.3 mg/dL   GFR calc non Af Amer 47 (L) >60 mL/min   GFR calc Af Wyvonnia Lora  54 (L) >60 mL/min    Comment: (NOTE) The eGFR has been calculated using the CKD EPI equation. This calculation has not been validated in all clinical situations. eGFR's persistently <60 mL/min signify possible Chronic Kidney Disease.    Anion gap 9 5 - 15  CBC     Status: Abnormal   Collection Time: 11/30/14  4:40 AM  Result Value Ref Range   WBC 8.0 4.0 - 10.5 K/uL   RBC 2.97 (L) 3.87 - 5.11 MIL/uL   Hemoglobin 8.7 (L) 12.0 - 15.0 g/dL   HCT 26.5 (L) 36.0 - 46.0 %   MCV 89.2 78.0 - 100.0 fL   MCH 29.3 26.0 - 34.0 pg   MCHC 32.8 30.0 - 36.0 g/dL   RDW 16.2 (H) 11.5 - 15.5 %   Platelets 150 150 - 400 K/uL  Magnesium     Status: Abnormal    Collection Time: 11/30/14  4:40 AM  Result Value Ref Range   Magnesium 1.5 (L) 1.7 - 2.4 mg/dL    Imaging / Studies: No results found.  Medications / Allergies: per chart  Antibiotics: Anti-infectives    Start     Dose/Rate Route Frequency Ordered Stop   11/30/14 0200  cefoTEtan (CEFOTAN) 2 g in dextrose 5 % 50 mL IVPB     2 g 100 mL/hr over 30 Minutes Intravenous Every 12 hours 11/29/14 1714 11/30/14 0202   11/29/14 0845  cefoTEtan in Dextrose 5% (CEFOTAN) IVPB 2 g    Comments:  Pharmacy may adjust dose strength for optimal dosing.   Send with patient on call to the OR.  Anesthesia to complete antibiotic administration <92mn [facility]   2 g Intravenous On call to O.R. 11/29/14 0845 11/29/14 1404        Note: Portions of this report may have been transcribed using voice recognition software. Every effort was made to ensure accuracy; however, inadvertent computerized transcription errors may be present.   Any transcriptional errors that result from this process are unintentional.     SAdin Hector M.D., F.A.C.S. Gastrointestinal and Minimally Invasive Surgery [facility] [address] [place] [phone] / Paging   11/30/2014  CARE TEAM:  PCP: TJoycelyn Man MD  Outpatient Care Team: Patient Care Team: JDorena Cookey MD as PCP - General SMichael Boston MD as Consulting Physician (General Surgery) DMilus Banister MD as Attending Physician (Gastroenterology)  Inpatient Treatment Team: Treatment Team: Attending Provider: SMichael Boston MD; Registered Nurse: JCharlena Cross RN

## 2014-11-30 NOTE — Progress Notes (Signed)
Pt attempted to ambulate around 2230 last night, she dangled on the side of the bed, but upon standing she complained that her left leg feels numb and her left knee feels like giving in. We sat her and rested for a few minutes but requested to be laid back in bed. We followed through around midnight, but stated left leg still feels very weak and could not even raise it up from where she lays. She was medicated with Dilaudid 1 mg IV around 0130 am and she was sound asleep until 0630 am. Did not bother to wake her up during those times for she might be too sedated to do the activity. Ambulation was first priority endorsed to the day shift people. Will continue to monitor.

## 2014-12-01 ENCOUNTER — Inpatient Hospital Stay (HOSPITAL_COMMUNITY): Payer: Medicare PPO

## 2014-12-01 MED ORDER — MAGNESIUM SULFATE 2 GM/50ML IV SOLN
2.0000 g | Freq: Once | INTRAVENOUS | Status: AC
  Administered 2014-12-01: 2 g via INTRAVENOUS
  Filled 2014-12-01: qty 50

## 2014-12-01 NOTE — Progress Notes (Signed)
Denies nausea and vomiting. Passing flatus. Pt had soft BM. Pt up bathing in shower. Advanced to Heart Healthy diet per order.

## 2014-12-01 NOTE — Evaluation (Signed)
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 Patient will benefit from PT to address problems listed  In note below. Will try a Knee immobilizer on L knee for ambulation to control knee from buckling. MD aware of L leg dysfunction.    Follow Up Recommendations SNF;Supervision/Assistance - 24 hour    Equipment Recommendations  Rolling walker with 5" wheels    Recommendations for Other Services       Precautions / Restrictions Precautions Precautions: Fall Precaution Comments: profound  quad weakness, knee will buckle  when standing, no control Required Braces or Orthoses: Knee Immobilizer - Left (will order one for walking)      Mobility  Bed Mobility Overal bed mobility: Needs Assistance Bed Mobility: Sit to Supine       Sit to supine: Mod assist   General bed mobility comments: assist with LLE onto bed, patient unable to pick leg up .  Transfers Overall transfer level: Needs assistance Equipment used: 1 person hand held assist Transfers: Stand Pivot Transfers;Squat Pivot Transfers Sit to Stand: Max assist Stand pivot transfers: Max assist       General transfer comment: pts L knee buckled- support at L knee provided to prevent buckling, pivot from recliner to [facility] to bed, care of L knee support taken.  Ambulation/Gait                Stairs            Wheelchair Mobility    Modified Rankin (Stroke Patients Only)       Balance Overall balance assessment: Needs assistance Sitting-balance support: No upper extremity supported Sitting balance-Leahy Scale: Fair     Standing balance support: Bilateral upper extremity supported Standing balance-Leahy Scale: Zero Standing balance comment:  L knee buckles                             Pertinent Vitals/Pain Pain Assessment: No/denies pain    Home Living Family/patient expects to be discharged to:: Private residence Living Arrangements: Spouse/significant other Available Help at Discharge: Family Type of Home: House Home Access: Stairs to enter Entrance Stairs-Rails: None Entrance Stairs-Number of Steps: 1-2 Home Layout: One level Home Equipment: Cane - single point;Bedside commode Additional Comments: patient is caregiver of spouse    Prior Function Level of Independence: Independent               Hand Dominance        Extremity/Trunk Assessment   Upper Extremity Assessment: Overall WFL for tasks assessed           Lower Extremity Assessment: LLE deficits/detail   LLE Deficits / Details: Knee extension, dorsiflexion and plantar flexion are WNL, patient  appreciates LT but reports" MY LEG FEELS ASLEEP, FEELS NUMB ON MY THIGH.     Communication   Communication: No difficulties  Cognition Arousal/Alertness: Awake/alert Behavior During Therapy: WFL for tasks assessed/performed Overall Cognitive Status: Within Functional Limits for tasks assessed                      General Comments      Exercises        Assessment/Plan    PT Assessment Patient needs continued PT services  PT Diagnosis Difficulty walking;Generalized weakness   PT Problem List Decreased strength;Decreased activity tolerance;Decreased balance;Decreased mobility;Impaired sensation  PT Treatment Interventions DME instruction;Gait training;Stair training;Therapeutic exercise;Therapeutic activities;Functional mobility training;Patient/family education   PT Goals (Current goals can be found in the Care Plan section) Acute Rehab PT Goals Patient Stated Goal: get this leg better PT Goal Formulation: With patient Time For Goal Achievement: 12/15/14 Potential to Achieve Goals: Good    Frequency Min 3X/week    Barriers to discharge Decreased caregiver support      Co-evaluation               End of Session Equipment Utilized During Treatment: Gait belt Activity Tolerance: Patient tolerated treatment well Patient left: in bed;with call bell/phone within reach;with nursing/sitter in room Nurse Communication: Mobility status         Time: 2549-8264 PT Time Calculation (min) (ACUTE ONLY): 35 min   Charges:   PT Evaluation $Initial PT Evaluation Tier I: 1 Procedure PT Treatments $Therapeutic Activity: 8-22 mins   PT G Codes:        Claretha Cooper 12/01/2014, 2:39 PM

## 2014-12-01 NOTE — Progress Notes (Signed)
Her daughter called this evening and was concerned about her mother's persistent left lower extremity weakness. 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 I spoke with Dr. Johney Maine about this since her saw her this morning. I called for a neurology consult and the physician will see her shortly. I spoke with the daughter and told her about the consult.

## 2014-12-01 NOTE — Progress Notes (Signed)
Pt c/o nausea and had about 120 cc emesis. Unable to drink clears without nausea. No order for IV fluid, so I ran her NS at [facility]. Will continue to monitor.

## 2014-12-01 NOTE — Progress Notes (Signed)
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  Seems mild & ROM / strength WNL to me.  Follow w OnQ removed.  Walker for ambulation - PT/OT eval.    -hypothyroidism - levothyroxine   -keep foley for urinary retention.  See if resolves after OnQ out  -HTN - metoprolol for now.  May add lisinopril in 1-2 days  -VTE prophylaxis- SCDs, etc  -mobilize as tolerated to help recovery  I updated the status of the patient to the patient, her RN, and the patient's daughter.  I made recommendations.  I answered questions.  Understanding & appreciation was expressed.   Adin Hector, M.D., F.A.C.S. Gastrointestinal and Minimally Invasive Surgery [facility] [address] [place], [place] [address] [phone] Main / Paging   12/01/2014  Subjective:  Pain minimal Feels left knee buckle - can stand but not walk - better now but still not WNL Tol fulls Foley replaced No n/v  Objective:  Vital signs:  Filed Vitals:   11/30/14 1805 11/30/14 2111 12/01/14 0038 12/01/14 0529  BP: 137/53 147/76 120/67 143/65  Pulse: 65 69 69 84  Temp: 98.3 F (36.8 C) 97.7 F (36.5 C)  98.2 F (36.8 C)  TempSrc: Oral Axillary  Oral  Resp: 18 16  18   Height:      Weight:      SpO2: 97% 98%  95%    Last BM Date:  (PTA)  Intake/Output   Yesterday:  05/14 0701 - 05/15 0700 In: 2120 [P.O.:80; I.V.:2040] Out: 3700 [Urine:3700] This shift:     Bowel  function:  Flatus: y  BM: y  Drain: n/a  Physical Exam:  General: Pt awake/alert/oriented x4 in no acute distress Eyes: PERRL, normal EOM.  Sclera clear.  No icterus Neuro: CN II-XII intact w/o focal sensory/motor deficits.  Flexing hip/kness/ankles w/o difficulty.  Cannot extend at knee well.  +weakness there Lymph: No head/neck/groin lymphadenopathy Psych:  No delerium/psychosis/paranoia HENT: Normocephalic, Mucus membranes moist.  No thrush Neck: Supple, No tracheal deviation Chest: No chest wall pain w good excursion CV:  Pulses intact.  Regular rhythm MS: Normal AROM mjr joints.  No obvious deformity GU:  NEFG.  Foley clear yellow urine Abdomen: Soft.  Nondistended.  Mildly tender at incisions only.  No evidence of peritonitis.  No incarcerated hernias. Ext:  SCDs BLE.  No mjr edema.  No cyanosis.   Skin: No petechiae / purpura  Results:   Labs: Results for orders placed or performed during the hospital encounter of 11/29/14 (from the past 48 hour(s))  Basic metabolic panel     Status: Abnormal   Collection Time: 11/30/14  4:40 AM  Result Value Ref Range   Sodium 128 (L) 135 - 145 mmol/L   Potassium 4.7 3.5 - 5.1 mmol/L   Chloride 99 (L) 101 - 111 mmol/L   CO2 20 (L) 22 - 32 mmol/L   Glucose, Bld 116 (H) 65 - 99 mg/dL   BUN 13 6 - 20 mg/dL  Creatinine, Ser 1.11 (H) 0.44 - 1.00 mg/dL   Calcium 8.0 (L) 8.9 - 10.3 mg/dL   GFR calc non Af Amer 47 (L) >60 mL/min   GFR calc Af Amer 54 (L) >60 mL/min    Comment: (NOTE) The eGFR has been calculated using the CKD EPI equation. This calculation has not been validated in all clinical situations. eGFR's persistently <60 mL/min signify possible Chronic Kidney Disease.    Anion gap 9 5 - 15  CBC     Status: Abnormal   Collection Time: 11/30/14  4:40 AM  Result Value Ref Range   WBC 8.0 4.0 - 10.5 K/uL   RBC 2.97 (L) 3.87 - 5.11 MIL/uL   Hemoglobin 8.7 (L) 12.0 - 15.0 g/dL   HCT 26.5 (L) 36.0 - 46.0 %   MCV 89.2 78.0 -  100.0 fL   MCH 29.3 26.0 - 34.0 pg   MCHC 32.8 30.0 - 36.0 g/dL   RDW 16.2 (H) 11.5 - 15.5 %   Platelets 150 150 - 400 K/uL  Magnesium     Status: Abnormal   Collection Time: 11/30/14  4:40 AM  Result Value Ref Range   Magnesium 1.5 (L) 1.7 - 2.4 mg/dL    Imaging / Studies: No results found.  Medications / Allergies: per chart  Antibiotics: Anti-infectives    Start     Dose/Rate Route Frequency Ordered Stop   11/30/14 0200  cefoTEtan (CEFOTAN) 2 g in dextrose 5 % 50 mL IVPB     2 g 100 mL/hr over 30 Minutes Intravenous Every 12 hours 11/29/14 1714 11/30/14 0202   11/29/14 0845  cefoTEtan in Dextrose 5% (CEFOTAN) IVPB 2 g    Comments:  Pharmacy may adjust dose strength for optimal dosing.   Send with patient on call to the OR.  Anest[facility] to complete antibiotic administration [facility]   2 g Intravenous On call to O.R. 11/29/14 0845 11/29/14 1404        Note: Portions of this report may have been transcribed using voice recognition software. Every effort was made to ensure accuracy; however, inadvertent computerized transcription errors may be present.   Any transcriptional errors that result from this process are unintentional.     SAdin Hector M.D., F.A.C.S. Gastrointestinal and Minimally Invasive Surgery [facility] [address] [place] [phone] / Paging   12/01/2014  CARE TEAM:  PCP: TJoycelyn Man MD  Outpatient Care Team: Patient Care Team: JDorena Cookey MD as PCP - General SMichael Boston MD as Consulting Physician (General Surgery) DMilus Banister MD as Attending Physician (Gastroenterology)  Inpatient Treatment Team: Treatment Team: Attending Provider: SMichael Boston MD; Registered Nurse: JCharlena Cross RN; Occupational Therapist: LBetsy Pries OT; Registered Nurse: LBailey Mech RN; Physical Therapist: KFuller Mandril PT

## 2014-12-01 NOTE — Evaluation (Signed)
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 Pt currently with functional limitations due to the deficits listed below (see OT Problem List).  Pt will benefit from skilled OT to increase their safety and independence with ADL and functional mobility for ADL to facilitate discharge to venue listed below.      Follow Up Recommendations  Home health OT;SNF;Supervision/Assistance - 24 hour;Other (comment) (depending on progress)    Equipment Recommendations  3 in 1 bedside comode       Precautions / Restrictions Precautions Precautions: Fall Precaution Comments: L knee buckling      Mobility Bed Mobility               General bed mobility comments: pt on BSC  Transfers Overall transfer level: Needs assistance Equipment used: 1 person hand held assist Transfers: Sit to/from [facility] Sit to Stand: Max assist Stand pivot transfers: Max assist       General transfer comment: pts L knee buckled- unable to self correct. Spoke with RN and MD         ADL Overall ADL's : Needs assistance/impaired Eating/Feeding: Set up;Sitting   Grooming: Set up;Sitting   Upper Body Bathing: Set up;Sitting   Lower Body Bathing: Cueing for safety;Sit to/from stand;Cueing for sequencing;Maximal assistance   Upper Body Dressing : Set up;Sitting   Lower Body Dressing: Maximal assistance;Sit to/from stand   Toilet Transfer: Maximal assistance;BSC;Cueing for safety   Toileting- Clothing Manipulation and Hygiene: Sit to/from stand         General ADL Comments: L quad with decreased activation. MD aware               Pertinent Vitals/Pain Pain Assessment: No/denies pain     Hand Dominance      Extremity/Trunk Assessment Upper Extremity Assessment Upper Extremity Assessment: Overall WFL for tasks assessed   Lower Extremity Assessment Lower Extremity Assessment: LLE deficits/detail LLE Deficits / Details: decreased knee extension       Communication Communication Communication: No difficulties   Cognition Arousal/Alertness: Awake/alert Behavior During Therapy: WFL for tasks assessed/performed Overall Cognitive Status: Within Functional Limits for tasks assessed                     General Comments    L quad weakness limiting pts progress           Home Living Family/patient expects to be discharged to:: Private residence Living Arrangements: Spouse/significant other Available Help at Discharge: Family Type of Home: House Home Access: Stairs to enter Technical brewer of Steps: 1-2   Home Layout: One level     Bathroom Shower/Tub: Tub/shower unit;Walk-in shower         Home Equipment: Kasandra Knudsen - single point;Bedside commode          Prior Functioning/Environment Level of Independence: Independent             OT Diagnosis: Generalized weakness   OT Problem List: Decreased strength;Decreased activity tolerance;Impaired balance (sitting and/or standing);Decreased safety awareness   OT Treatment/Interventions: Self-care/ADL training;DME and/or AE instruction;Patient/family education    OT Goals(Current goals can be found in the care plan section) Acute Rehab OT Goals Patient Stated Goal: get this leg better OT Goal Formulation: With patient Time For Goal Achievement: 12/15/14 ADL Goals Pt  Will Perform Grooming: with supervision;standing Pt Will Perform Lower Body Dressing: with min guard assist;sit to/from stand Pt Will Transfer to Toilet: with supervision;bedside commode;ambulating;regular height toilet Pt Will Perform Toileting - Clothing Manipulation and hygiene: sit to/from stand;with supervision  OT Frequency: Min 3X/week    Barriers to D/C:    decreased active knee extensin       Co-evaluation              End of Session Equipment Utilized During Treatment: Gait belt Nurse Communication: Mobility status  Activity Tolerance: Patient tolerated treatment well Patient left: in chair;with call bell/phone within reach;with family/visitor present   Time: 6168-3729 OT Time Calculation (min): 25 min Charges:  OT General Charges $OT Visit: 1 Procedure OT Evaluation $Initial OT Evaluation Tier I: 1 Procedure OT Treatments $Self Care/Home Management : 8-22 mins G-Codes:    Payton Mccallum D Dec 28, 2014, 12:10 PM

## 2014-12-02 ENCOUNTER — Encounter (HOSPITAL_COMMUNITY): Payer: Self-pay | Admitting: Surgery

## 2014-12-02 DIAGNOSIS — D379 Neoplasm of uncertain behavior of digestive organ, unspecified: Secondary | ICD-10-CM

## 2014-12-02 DIAGNOSIS — R29898 Other symptoms and signs involving the musculoskeletal system: Secondary | ICD-10-CM

## 2014-12-02 MED ORDER — TRAMADOL HCL 50 MG PO TABS
50.0000 mg | ORAL_TABLET | Freq: Four times a day (QID) | ORAL | Status: DC | PRN
Start: 2014-12-02 — End: 2014-12-02

## 2014-12-02 MED ORDER — BISMUTH SUBSALICYLATE 262 MG/15ML PO SUSP
30.0000 mL | Freq: Three times a day (TID) | ORAL | Status: DC | PRN
  Filled 2014-12-02: qty 118

## 2014-12-02 MED ORDER — LISINOPRIL 10 MG PO TABS
10.0000 mg | ORAL_TABLET | Freq: Every day | ORAL | Status: DC
  Administered 2014-12-02: 10 mg via ORAL
  Filled 2014-12-02: qty 1

## 2014-12-02 MED ORDER — HYDROMORPHONE HCL 1 MG/ML IJ SOLN: 0.5000 mg | INTRAMUSCULAR | Status: DC | PRN

## 2014-12-02 MED ORDER — METOPROLOL TARTRATE 12.5 MG HALF TABLET
12.5000 mg | ORAL_TABLET | Freq: Two times a day (BID) | ORAL | Status: DC | PRN
Start: 2014-12-02 — End: 2014-12-02
  Filled 2014-12-02: qty 1

## 2014-12-02 MED ORDER — METOPROLOL TARTRATE 1 MG/ML IV SOLN
5.0000 mg | Freq: Four times a day (QID) | INTRAVENOUS | Status: DC | PRN
  Filled 2014-12-02: qty 5

## 2014-12-02 NOTE — Plan of Care (Signed)
Problem: Phase III Progression Outcomes Goal: Activity at appropriate level-compared to baseline (UP IN CHAIR FOR HEMODIALYSIS)  Outcome: Completed/Met Date Met:  12/02/14 Pt up w/ PT- walked approx 150 feet using walker- steady gait noted

## 2014-12-02 NOTE — Progress Notes (Signed)
Discharge instructions given to patient along with prescription.  Questions answered

## 2014-12-02 NOTE — Consult Note (Signed)
Consult Reason for Consult:LLE weakness Referring Physician: Dr Johney Maine  CC: Left lower extremity weakness  HPI: Christy Wade is an 80 y.o. female admitted with intestinal mass, s/p laparoscopic small intestine resection on 5/13. No surgical complications, no noted BP fluctuations or excessive blood loss. Patient reports upon waking she noted weakness and sensory deficits in her left lower extremity. She reports it involved the whole extremity in no dermatomal distribution. Unable to walk due to weakness and sensory deficits. Patient had a bupivacaine pump in place post-op which was removed on 5/15. Shortly after this patient was able to ambulate with minimal assistance. Feels weakness has improved but is still present.   Past Medical History  Diagnosis Date  . Osteoporosis   . Varicose vein   . Hypertension   . Thyroid disease   . Hypothyroidism   . Anemia     on iron now  . PONV (postoperative nausea and vomiting)     Past Surgical History  Procedure Laterality Date  . Varicose vein surgery Left 1971  . Abdominal hysterectomy  1975    partial  . Appendectomy  1975    with hysterectomy    Family History  Problem Relation Age of Onset  . Diabetes Other   . Cancer Other     bone, breast  . Stroke Other   . Bone cancer Mother   . Diabetes Father   . Breast cancer Sister     Social History:  reports that she has never smoked. She has never used smokeless tobacco. She reports that she does not drink alcohol or use illicit drugs.  Allergies  Allergen Reactions  . Codeine     REACTION: nausea  . Sulfamethoxazole     REACTION: rash  . Sulfamethoxazole-Trimethoprim     REACTION: hives    Medications:  Scheduled: . acetaminophen  1,000 mg Oral TID  . antiseptic oral rinse  7 mL Mouth Rinse q12n4p  . chlorhexidine  15 mL Mouth Rinse BID  . heparin subcutaneous  5,000 Units Subcutaneous 3 times per day  . levothyroxine  50 mcg Oral QAC breakfast  . lip balm  1  application Topical BID  . metoprolol  5 mg Intravenous 4 times per day  . multivitamin with minerals  1 tablet Oral Q lunch  . saccharomyces boulardii  250 mg Oral BID     ROS: Out of a complete 14 system review, the patient complains of only the following symptoms, and all other reviewed systems are negative. +weakness, sensory deficits  Physical Examination: Filed Vitals:   12/01/14 2127  BP: 140/78  Pulse: 80  Temp: 98.1 F (36.7 C)  Resp: 16   Physical Exam  Constitutional: He appears well-developed and well-nourished.  Psych: Affect appropriate to situation Eyes: No scleral injection HENT: No OP obstrucion Head: Normocephalic.  Cardiovascular: Normal rate and regular rhythm.  Respiratory: Effort normal and breath sounds normal.  GI: Soft. Bowel sounds are normal. No distension. Surgical dressing in place Skin: WDI  Neurologic Examination Mental Status: Alert, oriented, thought content appropriate.  Speech fluent without evidence of aphasia.  Able to follow 3 step commands without difficulty. Cranial Nerves: II: funduscopic exam wnl bilaterally, visual fields grossly normal, pupils equal, round, reactive to light and accommodation III,IV, VI: ptosis not present, extra-ocular motions intact bilaterally V,VII: smile symmetric, facial light touch sensation normal bilaterally VIII: hearing normal bilaterally IX,X: gag reflex present XI: trapezius strength/neck flexion strength normal bilaterally XII: tongue strength normal  Motor: Right :  Upper extremity    Left:     Upper extremity 5/5 deltoid       5/5 deltoid 5/5 biceps      5/5 biceps  5/5 triceps      5/5 triceps 5/5 hand grip      5/5 hand grip  Lower extremity     Lower extremity 5/5 hip flexor      5-/5 hip flexor 5/5 quadricep      5/5 quadriceps  5/5 hamstrings     5/5 hamstrings 5/5 plantar flexion       5/5 plantar flexion 5/5 plantar extension     5/5 plantar extension Tone and bulk:normal tone  throughout; no atrophy noted Sensory: Pinprick and light touch intact throughout, bilaterally Deep Tendon Reflexes: 3+ and symmetric throughout Plantars: Right: downgoing   Left: downgoing Cerebellar: normal finger-to-nose, and normal heel-to-shin test Gait: deferred  Laboratory Studies:   Basic Metabolic Panel:  Recent Labs Lab 11/25/14 0840 11/30/14 0440  NA 132* 128*  K 4.8 4.7  CL 105 99*  CO2 24 20*  GLUCOSE 94 116*  BUN 16 13  CREATININE 0.93 1.11*  CALCIUM 8.7* 8.0*  MG  --  1.5*    Liver Function Tests: No results for input(s): AST, ALT, ALKPHOS, BILITOT, PROT, ALBUMIN in the last 168 hours. No results for input(s): LIPASE, AMYLASE in the last 168 hours. No results for input(s): AMMONIA in the last 168 hours.  CBC:  Recent Labs Lab 11/25/14 0840 11/30/14 0440  WBC 4.1 8.0  HGB 10.3* 8.7*  HCT 31.6* 26.5*  MCV 89.5 89.2  PLT 190 150    Cardiac Enzymes: No results for input(s): CKTOTAL, CKMB, CKMBINDEX, TROPONINI in the last 168 hours.  BNP: Invalid input(s): POCBNP  CBG: No results for input(s): GLUCAP in the last 168 hours.  Microbiology: No results found for this or any previous visit.  Coagulation Studies: No results for input(s): LABPROT, INR in the last 72 hours.  Urinalysis: No results for input(s): COLORURINE, LABSPEC, PHURINE, GLUCOSEU, HGBUR, BILIRUBINUR, KETONESUR, PROTEINUR, UROBILINOGEN, NITRITE, LEUKOCYTESUR in the last 168 hours.  Invalid input(s): APPERANCEUR  Lipid Panel:     Component Value Date/Time   CHOL 155 09/24/2014 0943   TRIG 97.0 09/24/2014 0943   HDL 55.60 09/24/2014 0943   CHOLHDL 3 09/24/2014 0943   VLDL 19.4 09/24/2014 0943   LDLCALC 80 09/24/2014 0943    HgbA1C: No results found for: HGBA1C  Urine Drug Screen:  No results found for: LABOPIA, COCAINSCRNUR, LABBENZ, AMPHETMU, THCU, LABBARB  Alcohol Level: No results for input(s): ETH in the last 168 hours.   Imaging: Dg Lumbar Spine  Complete  12/01/2014   CLINICAL DATA:  New onset left leg weakness. Status post small bowel tumor resection on Friday.  EXAM: LUMBAR SPINE - COMPLETE 4+ VIEW  COMPARISON:  None.  FINDINGS: Five non rib-bearing lumbar type vertebra are identified.  There is no evidence of acute fracture or subluxation.  Moderate degenerative disc disease and spondylosis at L4-5 and L5-S1 noted.  No focal bony lesions are identified.  IMPRESSION: No evidence of acute bony abnormality.  Moderate degenerative disc disease at L4-5 and L5-S1.   Electronically Signed   By: Margarette Canada M.D.   On: 12/01/2014 15:06     Assessment/Plan:  78y/o woman with history of HTN, hypothyroidism admitted for surgical resection of intestinal mass. Patient had successful surgery on 5/13 with no noted complications. Upon waking she noted subjective weakness and sensory deficits in the  left lower extremity causing difficulty walking. Symptoms have improved though she notes continued weakness. Neurological exam is overall unremarkable with good strength noted in her left lower extremity.  Low suspicion that this represents a stroke.Could be the manifestation of mild nerve irritation related to the surgery. Symptoms did appear to improve after discontinuation of bupivacaine pump so it could be related to this, though it seems unlikely.   No further imaging or neurological workup is indicated at this time. Patient will benefit from continued rehab. Reassured patient and her family that I expect she will make a full recovery and no further workup is indicated at this time. They expressed understanding and agreement.   Jim Like, DO [facility] [phone]  If 7pm- 7am, please page neurology on call as listed in [place]. 12/02/2014, 12:14 AM

## 2014-12-02 NOTE — Discharge Summary (Signed)
Physician Discharge Summary  Patient ID: Christy Wade MRN: [id number] DOB/AGE: June 06, 1936 79 y.o.  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  Postoperatively, the patient gradually mobilized and advanced to a solid diet.  Pain and other symptoms were treated aggressively.  Patient did have issues with urinary retention.  Foley catheter placed.  Was removed and urinating fine on day of discharge.  Patient struggled with partial weakness of her left lower extremity.  Was able to bear weight but not walk rather well.  On-Q catheter was removed.  Symptoms improved.  Weakness resolved by the evening within 12 hours.  Neurology consultation done and suspicion of stroke was low.  Seem most consistent with partial nerve block that resolved.   By the time of discharge, the patient was walking well the hallways, eating food, having flatus.  Pain was well-controlled on an oral medications.  Patient was walking well by herself in the hallways.  No difficulty with balance nor weakness.  Showered without incident.   Cleared by  physical and occupational therapy on day of discharge.  Based on meeting discharge criteria and continuing to recover, I felt it was safe for the patient to be discharged from the hospital to further recover with close followup. Postoperative recommendations were discussed in detail.  They are written as well.   Significant Diagnostic Studies:  Results for orders placed or performed during the hospital encounter of 11/29/14 (from the past 72 hour(s))  Basic metabolic panel     Status: Abnormal   Collection Time: 11/30/14  4:40 AM  Result Value Ref Range   Sodium 128 (L) 135 - 145 mmol/L   Potassium 4.7 3.5 - 5.1 mmol/L   Chloride 99 (L) 101 - 111 mmol/L   CO2 20 (L) 22 - 32 mmol/L   Glucose, Bld 116 (H) 65 - 99 mg/dL   BUN 13 6 - 20 mg/dL   Creatinine, Ser 1.11 (H) 0.44 - 1.00 mg/dL   Calcium 8.0 (L) 8.9 - 10.3 mg/dL   GFR calc non Af Amer 47 (L) >60 mL/min   GFR calc Af Amer 54 (L) >60 mL/min    Comment: (NOTE) The eGFR has been calculated using the CKD EPI equation. This calculation has not been validated in all clinical situations. eGFR's persistently <60 mL/min signify possible Chronic Kidney Disease.    Anion gap 9 5 - 15  CBC     Status: Abnormal   Collection Time: 11/30/14  4:40 AM  Result Value Ref Range   WBC 8.0 4.0 - 10.5 K/uL   RBC 2.97 (L) 3.87 - 5.11 MIL/uL   Hemoglobin 8.7 (L) 12.0 - 15.0 g/dL   HCT 26.5 (L) 36.0 -  46.0 %   MCV 89.2 78.0 - 100.0 fL   MCH 29.3 26.0 - 34.0 pg   MCHC 32.8 30.0 - 36.0 g/dL   RDW 16.2 (H) 11.5 - 15.5 %   Platelets 150 150 - 400 K/uL  Magnesium     Status: Abnormal   Collection Time: 11/30/14  4:40 AM  Result Value Ref Range   Magnesium 1.5 (L) 1.7 - 2.4 mg/dL    Dg Lumbar Spine Complete  12/01/2014   CLINICAL DATA:  New onset left leg weakness. Status post small bowel tumor resection on Friday.  EXAM: LUMBAR SPINE - COMPLETE 4+ VIEW  COMPARISON:  None.  FINDINGS: Five non rib-bearing lumbar type vertebra are identified.  There  is no evidence of acute fracture or subluxation.  Moderate degenerative disc disease and spondylosis at L4-5 and L5-S1 noted.  No focal bony lesions are identified.  IMPRESSION: No evidence of acute bony abnormality.  Moderate degenerative disc disease at L4-5 and L5-S1.   Electronically Signed   By: Margarette Canada M.D.   On: 12/01/2014 15:06    Discharge Exam: Blood pressure 154/75, pulse 92, temperature 97.8 F (36.6 C), temperature source Oral, resp. rate 18, height _0  (1.753 m), weight 71.4 kg (157 lb 6.5 oz), SpO2 98 %.  General: Pt awake/alert/oriented x4 in no major acute distress.  Smiling, calm. Eyes: PERRL, normal EOM. Sclera nonicteric Neuro: CN II-XII intact w/o focal sensory/motor deficits.  No weakness of lower extremities.  No obvious sensory deficits. Lymph: No head/neck/groin lymphadenopathy Psych:  No delerium/psychosis/paranoia HENT: Normocephalic, Mucus membranes moist.  No thrush Neck: Supple, No tracheal deviation Chest: No pain.  Good respiratory excursion. CV:  Pulses intact.  Regular rhythm MS: Normal AROM mjr joints.  No obvious deformity Abdomen: Soft, Nondistended.  Nontender.  No incarcerated hernias.  Laparoscopic incisions with normal healing ridges. Ext:  SCDs BLE.  No significant edema.  No cyanosis Skin: No petechiae / purpura  Discharged Condition: good   Past Medical History  Diagnosis Date  . Osteoporosis   . Varicose vein   . Hypertension   . Thyroid disease   . Hypothyroidism   . Anemia     on iron now  . PONV (postoperative nausea and vomiting)     Past Surgical History  Procedure Laterality Date  . Varicose vein surgery Left 1971  . Abdominal hysterectomy  1975    partial  . Appendectomy  1975    with hysterectomy    History   Social History  . Marital Status: Married    Spouse Name: N/A  . Number of Children: N/A  . Years of Education: N/A   Occupational History  . Not on file.   Social History Main Topics  . Smoking  status: Never Smoker   . Smokeless tobacco: Never Used  . Alcohol Use: No  . Drug Use: No  . Sexual Activity: Not on file   Other Topics Concern  . Not on file   Social History Narrative    Family History  Problem Relation Age of Onset  . Diabetes Other   . Cancer Other     bone, breast  . Stroke Other   . Bone cancer Mother   . Diabetes Father   . Breast cancer Sister     Current Facility-Administered Medications  Medication Dose Route Frequency Provider Last Rate Last Dose  . acetaminophen (TYLENOL) tablet 1,000 mg  1,000 mg Oral TID Michael Boston, MD   1,000 mg at  12/01/14 2202  . alum & mag hydroxide-simeth (MAALOX/MYLANTA) 200-200-20 MG/5ML suspension 30 mL  30 mL Oral Q6H PRN Michael Boston, MD      . antiseptic oral rinse (CPC / CETYLPYRIDINIUM CHLORIDE 0.05%) solution 7 mL  7 mL Mouth Rinse q12n4p Michael Boston, MD   7 mL at 12/01/14 1606  . bismuth subsalicylate (PEPTO BISMOL) 262 MG/15ML suspension 30 mL  30 mL Oral Q8H PRN Michael Boston, MD      . chlorhexidine (PERIDEX) 0.12 % solution 15 mL  15 mL Mouth Rinse BID Michael Boston, MD   15 mL at 12/01/14 2202  . diphenhydrAMINE (BENADRYL) 12.5 MG/5ML elixir 12.5 mg  12.5 mg Oral Q6H PRN Michael Boston, MD       Or  . diphenhydrAMINE (BENADRYL) injection 12.5 mg  12.5 mg Intravenous Q6H PRN Michael Boston, MD      . heparin injection 5,000 Units  5,000 Units Subcutaneous 3 times per day Michael Boston, MD   5,000 Units at 12/02/14 0543  . HYDROmorphone (DILAUDID) injection 0.5-2 mg  0.5-2 mg Intravenous Q2H PRN Michael Boston, MD      . levothyroxine (SYNTHROID, LEVOTHROID) tablet 50 mcg  50 mcg Oral QAC breakfast Michael Boston, MD   50 mcg at 12/02/14 0756  . lip balm (CARMEX) ointment 1 application  1 application Topical BID Michael Boston, MD   1 application at 27/51/70 1018  . lisinopril (PRINIVIL,ZESTRIL) tablet 10 mg  10 mg Oral Daily Michael Boston, MD      . magic mouthwash  15 mL Oral QID PRN Michael Boston, MD      .  menthol-cetylpyridinium (CEPACOL) lozenge 3 mg  1 lozenge Oral PRN Michael Boston, MD      . metoprolol (LOPRESSOR) injection 5 mg  5 mg Intravenous Q6H PRN Michael Boston, MD      . metoprolol tartrate (LOPRESSOR) tablet 12.5 mg  12.5 mg Oral Q12H PRN Michael Boston, MD      . multivitamin with minerals tablet 1 tablet  1 tablet Oral Q lunch Michael Boston, MD   1 tablet at 11/30/14 1316  . ondansetron (ZOFRAN) tablet 4 mg  4 mg Oral Q6H PRN Michael Boston, MD       Or  . ondansetron [facility]) injection 4 mg  4 mg Intravenous Q6H PRN Michael Boston, MD   4 mg at 11/30/14 1315  . phenol (CHLORASEPTIC) mouth spray 2 spray  2 spray Mouth/Throat PRN Michael Boston, MD      . polyvinyl alcohol (LIQUIFILM TEARS) 1.4 % ophthalmic solution 1-2 drop  1-2 drop Both Eyes Daily PRN Michael Boston, MD      . promethazine (PHENERGAN) injection 6.25-12.5 mg  6.25-12.5 mg Intravenous Q4H PRN Michael Boston, MD   12.5 mg at 11/30/14 2043  . saccharomyces boulardii (FLORASTOR) capsule 250 mg  250 mg Oral BID Michael Boston, MD   250 mg at 12/01/14 2202  . traMADol (ULTRAM) tablet 50-100 mg  50-100 mg Oral Q6H PRN Michael Boston, MD      . zolpidem [facility]) tablet 5 mg  5 mg Oral QHS PRN Michael Boston, MD         Allergies  Allergen Reactions  . Codeine Nausea Only    REACTION: nausea  . Sulfamethoxazole Rash    REACTION: rash    Disposition:   Discharge Instructions    Call MD for:  extreme fatigue    Complete by:  As directed      Call MD for:  extreme fatigue    Complete by:  As directed      Call MD for:  hives    Complete by:  As directed      Call MD for:  hives    Complete by:  As directed      Call MD for:  persistant nausea and vomiting    Complete by:  As directed      Call MD for:  persistant nausea and vomiting    Complete by:  As directed      Call MD for:  redness, tenderness, or signs of infection (pain, swelling, redness, odor or green/yellow discharge around incision site)    Complete by:  As directed       Call MD for:  redness, tenderness, or signs of infection (pain, swelling, redness, odor or green/yellow discharge around incision site)    Complete by:  As directed      Call MD for:  severe uncontrolled pain    Complete by:  As directed      Call MD for:  severe uncontrolled pain    Complete by:  As directed      Call MD for:    Complete by:  As directed   Temperature > 101.41F     Call MD for:    Complete by:  As directed   Temperature > 101.41F     Diet - low sodium heart healthy    Complete by:  As directed      Discharge instructions    Complete by:  As directed   Please see discharge instruction sheets.  Also refer to handout given an office.  Please call our office if you have any questions or concerns [phone]     Discharge instructions    Complete by:  As directed   Please see discharge instruction sheets.  Also refer to handout given an office.  Please call our office if you have any questions or concerns [phone]     Discharge wound care:    Complete by:  As directed   If you have closed incisions, shower and bathe over these incisions with soap and water every day.  Remove all surgical dressings on postoperative day #3.  You do not need to replace dressings over the closed incisions unless you feel more comfortable with a Band-Aid covering it.   If you have an open wound that requires packing, please see wound care instructions.  In general, remove all dressings, wash wound with soap and water and then replace with saline moistened gauze.  Do the dressing change at least every day.  Please call our office [phone] if you have further questions.     Discharge wound care:    Complete by:  As directed   If you have closed incisions, shower and bathe over these incisions with soap and water every day.  Remove all surgical dressings on postoperative day #3.  You do not need to replace dressings over the closed incisions unless you feel more comfortable with a  Band-Aid covering it.   If you have an open wound that requires packing, please see wound care instructions.  In general, remove all dressings, wash wound with soap and water and then replace with saline moistened gauze.  Do the dressing change at least every day.  Please call our office [phone] if you have further questions.     Driving Restrictions    Complete by:  As directed   No driving  until off narcotics and can safely swerve away without pain during an emergency     Driving Restrictions    Complete by:  As directed   No driving until off narcotics and can safely swerve away without pain during an emergency     Increase activity slowly    Complete by:  As directed   Walk an hour a day.  Use 20-30 minute walks.  When you can walk 30 minutes without difficulty, increase to low impact/moderate activities such as biking, jogging, swimming, sexual activity..  Eventually can increase to unrestricted activity when not feeling pain.  If you feel pain: STOP!Marland Kitchen   Let pain protect you from overdoing it.  Use ice/heat/over-the-counter pain medications to help minimize his soreness.  Use pain prescriptions as needed to remain active.  It is better to take extra pain medications and be more active than to stay bedridden to avoid all pain medications.     Increase activity slowly    Complete by:  As directed   Walk an hour a day.  Use 20-30 minute walks.  When you can walk 30 minutes without difficulty, increase to low impact/moderate activities such as biking, jogging, swimming, sexual activity..  Eventually can increase to unrestricted activity when not feeling pain.  If you feel pain: STOP!Marland Kitchen   Let pain protect you from overdoing it.  Use ice/heat/over-the-counter pain medications to help minimize his soreness.  Use pain prescriptions as needed to remain active.  It is better to take extra pain medications and be more active than to stay bedridden to avoid all pain medications.     Lifting restrictions     Complete by:  As directed   Avoid heavy lifting initially.  Do not push through pain.  You have no specific weight limit.  Coughing and sneezing or four more stressful to your incision than any lifting you will do. Pain will protect you from injury.  Therefore, avoid intense activity until off all narcotic pain medications.  Coughing and sneezing or four more stressful to your incision than any lifting he will do.     Lifting restrictions    Complete by:  As directed   Avoid heavy lifting initially.  Do not push through pain.  You have no specific weight limit.  Coughing and sneezing or four more stressful to your incision than any lifting you will do. Pain will protect you from injury.  Therefore, avoid intense activity until off all narcotic pain medications.  Coughing and sneezing or four more stressful to your incision than any lifting he will do.     May shower / Bathe    Complete by:  As directed      May shower / Bathe    Complete by:  As directed      May walk up steps    Complete by:  As directed      May walk up steps    Complete by:  As directed      Sexual Activity Restrictions    Complete by:  As directed   Sexual activity as tolerated.  Do not push through pain.  Pain will protect you from injury.     Sexual Activity Restrictions    Complete by:  As directed   Sexual activity as tolerated.  Do not push through pain.  Pain will protect you from injury.     Walk with assistance    Complete by:  As directed   Walk over an hour  a day.  May use a walker/cane/companion to help with balance and stamina.     Walk with assistance    Complete by:  As directed   Walk over an hour a day.  May use a walker/cane/companion to help with balance and stamina.            Medication List    TAKE these medications        CALCIUM-VITAMIN D PO  Take 1 tablet by mouth daily after lunch.     cyclobenzaprine 5 MG tablet  Commonly known as:  FLEXERIL  1 by mouth daily at bedtime when  necessary     Iron 325 (65 FE) MG Tabs  Take by mouth 2 (two) times daily. Over the counter     levothyroxine 50 MCG tablet  Commonly known as:  SYNTHROID, LEVOTHROID  TAKE ONE TABLET BY MOUTH EVERY MORNING     lisinopril 10 MG tablet  Commonly known as:  PRINIVIL,ZESTRIL  TAKE ONE TABLET BY MOUTH DAILY     multivitamin tablet  Take 1 tablet by mouth daily with lunch.     polyvinyl alcohol 1.4 % ophthalmic solution  Commonly known as:  LIQUIFILM TEARS  Place 1-2 drops into both eyes daily as needed for dry eyes.     traMADol 50 MG tablet  Commonly known as:  ULTRAM  Take 1-2 tablets (50-100 mg total) by mouth every 6 (six) hours as needed for moderate pain or severe pain. 1/2-1 tablet at bedtime for pain           Follow-up Information    Follow up with Tawnie Ehresman C., MD In 2 weeks.   Specialty:  General Surgery   Why:  To follow up after your operation, To follow up after your hospital stay   Contact information:   [address] [place] [address] [phone]        Signed: Morton Peters, M.D., F.A.C.S. Gastrointestinal and Minimally Invasive Surgery [facility] [address], [place], [place] [address] [phone] Main / Paging   12/02/2014, 9:59 AM

## 2014-12-02 NOTE — Discharge Instructions (Signed)
ABDOMINAL SURGERY: POST OP INSTRUCTIONS  1. DIET: Follow a light bland diet the first 24 hours after arrival home, such as soup, liquids, crackers, etc.  Be sure to include lots of fluids daily.  Avoid fast food or heavy meals as your are more likely to get nauseated.  Eat a low fat the next few days after surgery.   2. Take your usually prescribed home medications unless otherwise directed. 3. PAIN CONTROL: a. Pain is best controlled by a usual combination of three different methods TOGETHER: i. Ice/Heat ii. Over the counter pain medication iii. Prescription pain medication b. Most patients will experience some swelling and bruising around the incisions.  Ice packs or heating pads (30-60 minutes up to 6 times a day) will help. Use ice for the first few days to help decrease swelling and bruising, then switch to heat to help relax tight/sore spots and speed recovery.  Some people prefer to use ice alone, heat alone, alternating between ice & heat.  Experiment to what works for you.  Swelling and bruising can take several weeks to resolve.   c. It is helpful to take an over-the-counter pain medication regularly for the first few weeks.  Choose one of the following that works best for you: i. Naproxen (Aleve, etc)  Two 228m tabs twice a day ii. Ibuprofen (Advil, etc) Three 2071mtabs four times a day (every meal & bedtime) iii. Acetaminophen (Tylenol, etc) 500-65061mour times a day (every meal & bedtime) d. A  prescription for pain medication (such as oxycodone, hydrocodone, etc) should be given to you upon discharge.  Take your pain medication as prescribed.  i. If you are having problems/concerns with the prescription medicine (does not control pain, nausea, vomiting, rash, itching, etc), please call us [phone] see if we need to switch you to a different pain medicine that will work better for you and/or control your side effect better. ii. If you need a refill on your pain medication,  please contact your pharmacy.  They will contact our office to request authorization. Prescriptions will not be filled after 5 pm or on week-ends. 4. Avoid getting constipated.  Between the surgery and the pain medications, it is common to experience some constipation.  Increasing fluid intake and taking a fiber supplement (such as Metamucil, Citrucel, FiberCon, MiraLax, etc) 1-2 times a day regularly will usually help prevent this problem from occurring.  A mild laxative (prune juice, Milk of Magnesia, MiraLax, etc) should be taken according to package directions if there are no bowel movements after 48 hours.   5. Watch out for diarrhea.  If you have many loose bowel movements, simplify your diet to bland foods & liquids for a few days.  Stop any stool softeners and decrease your fiber supplement.  Switching to mild anti-diarrheal medications (Kayopectate, Pepto Bismol) can help.  If this worsens or does not improve, please call us.[place]. Wash / shower every day.  You may shower over the incision / wound.  Avoid baths until the skin is fully healed.  Continue to shower over incision(s) after the dressing is off. 7. Remove your waterproof bandages 5 days after surgery.  You may leave the incision open to air.  Remove any wicks or ribbons in your wound.  If you have an open wound, please see wound care instructions. You may replace a dressing/Band-Aid to cover the incision for comfort if you wish. 8. ACTIVITIES as tolerated:   a. You may resume regular (light)  daily activities beginning the next day--such as daily self-care, walking, climbing stairs--gradually increasing activities as tolerated.  If you can walk 30 minutes without difficulty, it is safe to try more intense activity such as jogging, treadmill, bicycling, low-impact aerobics, swimming, etc. b. Save the most intensive and strenuous activity for last such as sit-ups, heavy lifting, contact sports, etc  Refrain from any heavy lifting or straining  until you are off narcotics for pain control.   c. DO NOT PUSH THROUGH PAIN.  Let pain be your guide: If it hurts to do something, don't do it.  Pain is your body warning you to avoid that activity for another week until the pain goes down. d. You may drive when you are no longer taking prescription pain medication, you can comfortably wear a seatbelt, and you can safely maneuver your car and apply brakes. e. Dennis Bast may have sexual intercourse when it is comfortable.  9. FOLLOW UP in our office a. Please call [facility] at [phone] to set up an appointment to see your surgeon in the office for a follow-up appointment approximately 1-2 weeks after your surgery. b. Make sure that you call for this appointment the day you arrive home to insure a convenient appointment time. 10. IF YOU HAVE DISABILITY OR FAMILY LEAVE FORMS, BRING THEM TO THE OFFICE FOR PROCESSING.  DO NOT GIVE THEM TO YOUR DOCTOR.   WHEN TO CALL us [phone]: 1. Poor pain control 2. Reactions / problems with new medications (rash/itching, nausea, etc)  3. Fever over 101.5 F (38.5 C) 4. Inability to urinate 5. Nausea and/or vomiting 6. Worsening swelling or bruising 7. Continued bleeding from incision. 8. Increased pain, redness, or drainage from the incision  The clinic staff is available to answer your questions during regular business hours (8:30am-5pm).  Please dont hesitate to call and ask to speak to one of our nurses for clinical concerns.   A surgeon from [facility] is always on call at the hospitals   If you have a medical emergency, go to the nearest emergency room or call 911.    [facility], [place], [place], [place], [place]  [address] ? MAIN: [phone] ? TOLL FREE: [phone] ? FAX [phone] [email or web address]  Managing Pain  Pain after surgery or related to activity is often due to strain/injury to muscle, tendon, nerves and/or  incisions.  This pain is usually short-term and will improve in a few months.   Many people find it helpful to do the following things TOGETHER to help speed the process of healing and to get back to regular activity more quickly:  1. Avoid heavy physical activity at first a. No lifting greater than 20 pounds at first, then increase to lifting as tolerated over the next few weeks b. Do not push through the pain.  Listen to your body and avoid positions and maneuvers than reproduce the pain.  Wait a few days before trying something more intense c. Walking is okay as tolerated, but go slowly and stop when getting sore.  If you can walk 30 minutes without stopping or pain, you can try more intense activity (running, jogging, aerobics, cycling, swimming, treadmill, sex, sports, weightlifting, etc ) d. Remember: If it hurts to do it, then dont do it!  2. Take Anti-inflammatory medication a. Choose ONE of the following over-the-counter medications: i.            Acetaminophen $RemoveBefore'500mg'uhheoUrvCGQDL$  tabs (Tylenol) 1-2 pills with  every meal and just before bedtime (avoid if you have liver problems) ii.            Naproxen $RemoveB'220mg'DfJUMwaf$  tabs (ex. Aleve) 1-2 pills twice a day (avoid if you have kidney, stomach, IBD, or bleeding problems) iii. Ibuprofen $RemoveBefore'200mg'rPboMduxKTVec$  tabs (ex. Advil, Motrin) 3-4 pills with every meal and just before bedtime (avoid if you have kidney, stomach, IBD, or bleeding problems) b. Take with food/snack around the clock for 1-2 weeks i. This helps the muscle and nerve tissues become less irritable and calm down faster  3. Use a Heating pad or Ice/Cold Pack a. 4-6 times a day b. May use warm bath/hottub  or showers  4. Try Gentle Massage and/or Stretching  a. at the area of pain many times a day b. stop if you feel pain - do not overdo it  Try these steps together to help you body heal faster and avoid making things get worse.  Doing just one of these things may not be enough.    If you are not getting  better after two weeks or are noticing you are getting worse, contact our office for further advice; we may need to re-evaluate you & see what other things we can do to help.  GETTING TO GOOD BOWEL HEALTH. Irregular bowel habits such as constipation and diarrhea can lead to many problems over time.  Having one soft bowel movement a day is the most important way to prevent further problems.  The anorectal canal is designed to handle stretching and feces to safely manage our ability to get rid of solid waste (feces, poop, stool) out of our body.  BUT, hard constipated stools can act like ripping concrete bricks and diarrhea can be a burning fire to this very sensitive area of our body, causing inflamed hemorrhoids, anal fissures, increasing risk is perirectal abscesses, abdominal pain/bloating, an making irritable bowel worse.     The goal: ONE SOFT BOWEL MOVEMENT A DAY!  To have soft, regular bowel movements:   Drink at least 8 tall glasses of water a day.    Take plenty of fiber.  Fiber is the undigested part of plant food that passes into the colon, acting s natures broom to encourage bowel motility and movement.  Fiber can absorb and hold large amounts of water. This results in a larger, bulkier stool, which is soft and easier to pass. Work gradually over several weeks up to 6 servings a day of fiber (25g a day even more if needed) in the form of: o Vegetables -- Root (potatoes, carrots, turnips), leafy green (lettuce, salad greens, celery, spinach), or cooked high residue (cabbage, broccoli, etc) o Fruit -- Fresh (unpeeled skin & pulp), Dried (prunes, apricots, cherries, etc ),  or stewed ( applesauce)  o Whole grain breads, pasta, etc (whole wheat)  o Bran cereals   Bulking Agents -- This type of water-retaining fiber generally is easily obtained each day by one of the following:  o Psyllium bran -- The psyllium plant is remarkable because its ground seeds can retain so much water. This product  is available as Metamucil, Konsyl, Effersyllium, Per Diem Fiber, or the less expensive generic preparation in drug and health food stores. Although labeled a laxative, it really is not a laxative.  o Methylcellulose -- This is another fiber derived from wood which also retains water. It is available as Citrucel. o Polyethylene Glycol - and artificial fiber commonly called Miralax or Glycolax.  It is helpful for  people with gassy or bloated feelings with regular fiber o Flax Seed - a less gassy fiber than psyllium  No reading or other relaxing activity while on the toilet. If bowel movements take longer than 5 minutes, you are too constipated  AVOID CONSTIPATION.  High fiber and water intake usually takes care of this.  Sometimes a laxative is needed to stimulate more frequent bowel movements, but   Laxatives are not a good long-term solution as it can wear the colon out. o Osmotics (Milk of Magnesia, Fleets phosphosoda, Magnesium citrate, MiraLax, GoLytely) are safer than  o Stimulants (Senokot, Castor Oil, Dulcolax, Ex Lax)    o Do not take laxatives for more than 7days in a row.   IF SEVERELY CONSTIPATED, try a Bowel Retraining Program: o Do not use laxatives.  o Eat a diet high in roughage, such as bran cereals and leafy vegetables.  o Drink six (6) ounces of prune or apricot juice each morning.  o Eat two (2) large servings of stewed fruit each day.  o Take one (1) heaping tablespoon of a psyllium-based bulking agent twice a day. Use sugar-free sweetener when possible to avoid excessive calories.  o Eat a normal breakfast.  o Set aside 15 minutes after breakfast to sit on the toilet, but do not strain to have a bowel movement.  o If you do not have a bowel movement by the third day, use an enema and repeat the above steps.   Controlling diarrhea o Switch to liquids and simpler foods for a few days to avoid stressing your intestines further. o Avoid dairy products (especially milk &  ice cream) for a short time.  The intestines often can lose the ability to digest lactose when stressed. o Avoid foods that cause gassiness or bloating.  Typical foods include beans and other legumes, cabbage, broccoli, and dairy foods.  Every person has some sensitivity to other foods, so listen to our body and avoid those foods that trigger problems for you. o Adding fiber (Citrucel, Metamucil, psyllium, Miralax) gradually can help thicken stools by absorbing excess fluid and retrain the intestines to act more normally.  Slowly increase the dose over a few weeks.  Too much fiber too soon can backfire and cause cramping & bloating. o Probiotics (such as active yogurt, Align, etc) may help repopulate the intestines and colon with normal bacteria and calm down a sensitive digestive tract.  Most studies show it to be of mild help, though, and such products can be costly. o Medicines: - Bismuth subsalicylate (ex. Kayopectate, Pepto Bismol) every 30 minutes for up to 6 doses can help control diarrhea.  Avoid if pregnant. - Loperamide (Immodium) can slow down diarrhea.  Start with two tablets ($RemoveBefo'4mg'uOukRKjGMtA$  total) first and then try one tablet every 6 hours.  Avoid if you are having fevers or severe pain.  If you are not better or start feeling worse, stop all medicines and call your doctor for advice o Call your doctor if you are getting worse or not better.  Sometimes further testing (cultures, endoscopy, X-ray studies, bloodwork, etc) may be needed to help diagnose and treat the cause of the diarrhea.  Exercise to Stay Healthy Exercise helps you become and stay healthy. EXERCISE IDEAS AND TIPS Choose exercises that:  You enjoy.  Fit into your day. You do not need to exercise really hard to be healthy. You can do exercises at a slow or medium level and stay healthy. You can:  Stretch before  and after working out.  Try yoga, Pilates, or tai chi.  Lift weights.  Walk fast, swim, jog, run, climb stairs,  bicycle, dance, or rollerskate.  Take aerobic classes. Exercises that burn about 150 calories:  Running 1  miles in 15 minutes.  Playing volleyball for 45 to 60 minutes.  Washing and waxing a car for 45 to 60 minutes.  Playing touch football for 45 minutes.  Walking 1  miles in 35 minutes.  Pushing a stroller 1  miles in 30 minutes.  Playing basketball for 30 minutes.  Raking leaves for 30 minutes.  Bicycling 5 miles in 30 minutes.  Walking 2 miles in 30 minutes.  Dancing for 30 minutes.  Shoveling snow for 15 minutes.  Swimming laps for 20 minutes.  Walking up stairs for 15 minutes.  Bicycling 4 miles in 15 minutes.  Gardening for 30 to 45 minutes.  Jumping rope for 15 minutes.  Washing windows or floors for 45 to 60 minutes. Document Released: 08/07/2010 Document Revised: 09/27/2011 Document Reviewed: 08/07/2010 [facility] Patient Information 2015 [place], [place]. This information is not intended to replace advice given to you by your health care provider. Make sure you discuss any questions you have with your health care provider.  Iron Deficiency Anemia Anemia is a condition in which there are less red blood cells or hemoglobin in the blood than normal. Hemoglobin is the part of red blood cells that carries oxygen. Iron deficiency anemia is anemia caused by too little iron. It is the most common type of anemia. It may leave you tired and short of breath. CAUSES   Lack of iron in the diet.  Poor absorption of iron, as seen with intestinal disorders.  Intestinal bleeding.  Heavy periods. SIGNS AND SYMPTOMS  Mild anemia may not be noticeable. Symptoms may include:  Fatigue.  Headache.  Pale skin.  Weakness.  Tiredness.  Shortness of breath.  Dizziness.  Cold hands and feet.  Fast or irregular heartbeat. DIAGNOSIS  Diagnosis requires a thorough evaluation and physical exam by your health care provider. Blood tests are generally used to  confirm iron deficiency anemia. Additional tests may be done to find the underlying cause of your anemia. These may include:  Testing for blood in the stool (fecal occult blood test).  A procedure to see inside the colon and rectum (colonoscopy).  A procedure to see inside the esophagus and stomach (endoscopy). TREATMENT  Iron deficiency anemia is treated by correcting the cause of the deficiency. Treatment may involve:  Adding iron-rich foods to your diet.  Taking iron supplements. Pregnant or breastfeeding women need to take extra iron because their normal diet usually does not provide the required amount.  Taking vitamins. Vitamin C improves the absorption of iron. Your health care provider may recommend that you take your iron tablets with a glass of orange juice or vitamin C supplement.  Medicines to make heavy menstrual flow lighter.  Surgery. HOME CARE INSTRUCTIONS   Take iron as directed by your health care provider.  If you cannot tolerate taking iron supplements by mouth, talk to your health care provider about taking them through a vein (intravenously) or an injection into a muscle.  For the best iron absorption, iron supplements should be taken on an empty stomach. If you cannot tolerate them on an empty stomach, you may need to take them with food.  Do not drink milk or take antacids at the same time as your iron supplements. Milk and antacids may interfere  with the absorption of iron.  Iron supplements can cause constipation. Make sure to include fiber in your diet to prevent constipation. A stool softener may also be recommended.  Take vitamins as directed by your health care provider.  Eat a diet rich in iron. Foods high in iron include liver, lean beef, whole-grain bread, eggs, dried fruit, and dark green leafy vegetables. SEEK IMMEDIATE MEDICAL CARE IF:   You faint. If this happens, do not drive. Call your local emergency services (911 in [place] if no other help  is available.  You have chest pain.  You feel nauseous or vomit.  You have severe or increased shortness of breath with activity.  You feel weak.  You have a rapid heartbeat.  You have unexplained sweating.  You become light-headed when getting up from a chair or bed. MAKE SURE YOU:   Understand these instructions.  Will watch your condition.  Will get help right away if you are not doing well or get worse. Document Released: 07/02/2000 Document Revised: 07/10/2013 Document Reviewed: 03/12/2013 [facility] Patient Information 2015 [place], [place]. This information is not intended to replace advice given to you by your health care provider. Make sure you discuss any questions you have with your health care provider.

## 2014-12-02 NOTE — Progress Notes (Signed)
Patient Details Name: Christy Wade MRN: [id number] DOB: January 19, 1936   Cancelled Treatment:     Patient's L thigh weakness resolved. Patient does need a RW for home. RN notified.   Claretha Cooper 12/02/2014, 12:52 PM Tresa Endo PT [phone]

## 2014-12-02 NOTE — Care Management Note (Signed)
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   Action/Plan: Discharge planning  Expected Discharge Date:                  Expected Discharge Plan:  Home/Self Care  In-House Referral:  Clinical Social Work  Discharge planning Services  CM Consult  Post Acute Care Choice:    Choice offered to:     DME Arranged:    DME Agency:     HH Arranged:    [place] Agency:     Status of Service:  Completed, signed off  Medicare Important Message Given:  Yes Date Medicare IM Given:  12/02/14 Medicare IM give by:  Rosezetta Schlatter RN CM  Date Additional Medicare IM Given:    Additional Medicare Important Message give by:     If discussed at [place] of Stay Meetings, dates discussed:    Additional Comments:  Guadalupe Maple, RN 12/02/2014, 10:04 AM

## 2014-12-02 NOTE — Progress Notes (Signed)
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   Follow Up Recommendations  No OT follow up    Equipment Recommendations  Other (comment) (rolling walker)    Recommendations for Other Services      Precautions / Restrictions Precautions Precautions: None       Mobility Bed Mobility Overal bed mobility: Independent                Transfers Overall transfer level: Modified independent Equipment used: Rolling walker (2 wheeled)                  Balance                                   ADL Overall ADL's : At baseline                                       General ADL Comments: pts quad has regained strength!  Pt at baseline with ADL activity                             General Comments  pts quad regained strength.  No knee buckling and pt able to walk    Pertinent Vitals/ Pain       Pain Assessment: No/denies pain  Home Living                                              Progress Toward Goals  OT Goals(current goals can now be found in the care plan section)  Progress towards OT goals: Progressing toward goals     Plan Discharge plan needs to be updated    Co-evaluation                 End of Session     Activity Tolerance Patient tolerated treatment well   Patient Left in chair;with call bell/phone within reach   Nurse Communication Mobility status        Time: 1000-1030 OT Time Calculation (min): 30 min  Charges: OT General Charges $OT Visit: 1 Procedure OT Treatments $Self Care/Home Management : 23-37 mins  Kyana Aicher, Thereasa Parkin 12/02/2014, 10:36 AM

## 2014-12-02 NOTE — Care Management Note (Signed)
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  Action/Plan: Discharge planning  Expected Discharge Date:                  Expected Discharge Plan:  Home/Self Care  In-House Referral:  Clinical Social Work  Discharge planning Services  CM Consult  Post Acute Care Choice:    Choice offered to:     DME Arranged:  Walker rolling DME Agency:  [place]:    [place] Agency:     Status of Service:  Completed, signed off  Medicare Important Message Given:  Yes Date Medicare IM Given:  12/02/14 Medicare IM give by:  Rosezetta Schlatter RN CM  Date Additional Medicare IM Given:    Additional Medicare Important Message give by:     If discussed at [place] of Stay Meetings, dates discussed:    Additional Comments:  Guadalupe Maple, RN 12/02/2014, 1:46 PM

## 2014-12-02 NOTE — Progress Notes (Signed)
Honeycomb dsg and wicks removed per order.  No bleeding noted- old blood on honeycomb.  Pt to shower after breakfast this am

## 2015-01-13 DIAGNOSIS — K633 Ulcer of intestine: Secondary | ICD-10-CM | POA: Diagnosis not present

## 2015-01-13 LAB — CBC AND DIFFERENTIAL
HCT: 34 % — AB (ref 36–46)
Hemoglobin: 11 g/dL — AB (ref 12.0–16.0)

## 2015-01-16 ENCOUNTER — Encounter: Payer: Self-pay | Admitting: Family Medicine

## 2015-02-24 ENCOUNTER — Ambulatory Visit (INDEPENDENT_AMBULATORY_CARE_PROVIDER_SITE_OTHER): Payer: Medicare PPO | Admitting: Gastroenterology

## 2015-02-24 ENCOUNTER — Encounter: Payer: Self-pay | Admitting: Gastroenterology

## 2015-02-24 VITALS — BP 140/84 | HR 80 | Ht 66.75 in | Wt 144.5 lb

## 2015-02-24 DIAGNOSIS — K633 Ulcer of intestine: Secondary | ICD-10-CM

## 2015-02-24 NOTE — Progress Notes (Signed)
Review of pertinent gastrointestinal problems: 1. Heme positive stool and significant new IDA (Hb 13 down to 8.5);  Colonoscopy 09/2014 Ardis Hughs was normal; EGD 09/2014 Ardis Hughs found mild gastritis (H pylori neg): CT scan 09/2014 1. Focal area of near circumferential mass-like thickening in thedistal small bowel (likely proximal to mid ileum), with someassociated lymphadenopathy in the adjacent small bowel mesenteries,highly concerning for small bowel neoplasm. 11/2013 Surgical resection of mass Dr. Johney Maine however pathology of mass was "ulcer" and all nodes were neg for cancer   HPI: This is a   very pleasant 78 year old woman whom I last saw several months ago.  Chief complaint is small bowel ulcer  She recovered from surgery.  Has been doing very well.   She has had some erratic stools since surgery but those seem to be improving with time.  , Colitis do not run in her family. She has really never had bowel issues prior to this.    Past Medical History  Diagnosis Date  . Osteoporosis   . Varicose vein   . Hypertension   . Thyroid disease   . Hypothyroidism   . Anemia     on iron now  . PONV (postoperative nausea and vomiting)     Past Surgical History  Procedure Laterality Date  . Varicose vein surgery Left 1971  . Abdominal hysterectomy  1975    partial  . Appendectomy  1975    with hysterectomy  . Laparoscopic small bowel resection N/A 11/29/2014    Procedure: LAPAROSCOPIC SMALL INTESTINE RESECTION;  Surgeon: Michael Boston, MD;  Location: [facility];  Service: General;  Laterality: N/A;    Current Outpatient Prescriptions  Medication Sig Dispense Refill  . CALCIUM-VITAMIN D PO Take 1 tablet by mouth daily after lunch.    . Ferrous Sulfate (IRON) 325 (65 FE) MG TABS Take by mouth 2 (two) times daily. Over the counter    . levothyroxine (SYNTHROID, LEVOTHROID) 50 MCG tablet TAKE ONE TABLET BY MOUTH EVERY MORNING 100 tablet 3  . lisinopril (PRINIVIL,ZESTRIL) 10 MG tablet TAKE ONE TABLET  BY MOUTH DAILY 100 tablet 3  . Multiple Vitamin (MULTIVITAMIN) tablet Take 1 tablet by mouth daily with lunch.     . polyvinyl alcohol (LIQUIFILM TEARS) 1.4 % ophthalmic solution Place 1-2 drops into both eyes daily as needed for dry eyes.    . cyclobenzaprine (FLEXERIL) 5 MG tablet 1 by mouth daily at bedtime when necessary (Patient not taking: Reported on 11/20/2014) 30 tablet 2  . traMADol (ULTRAM) 50 MG tablet Take 1-2 tablets (50-100 mg total) by mouth every 6 (six) hours as needed for moderate pain or severe pain. 1/2-1 tablet at bedtime for pain (Patient not taking: Reported on 02/24/2015) 40 tablet 0   No current facility-administered medications for this visit.    Allergies as of 02/24/2015 - Review Complete 02/24/2015  Allergen Reaction Noted  . Codeine Nausea Only 01/09/2009  . Sulfamethoxazole Rash 01/02/2007    Family History  Problem Relation Age of Onset  . Diabetes Other   . Cancer Other     bone, breast  . Stroke Other   . Bone cancer Mother   . Diabetes Father   . Breast cancer Sister     History   Social History  . Marital Status: Married    Spouse Name: N/A  . Number of Children: N/A  . Years of Education: N/A   Occupational History  . Not on file.   Social History Main Topics  . Smoking  status: Never Smoker   . Smokeless tobacco: Never Used  . Alcohol Use: No  . Drug Use: No  . Sexual Activity: Not on file   Other Topics Concern  . Not on file   Social History Narrative     Physical Exam: BP 140/84 mmHg  Pulse 80  Ht 5' 6.75" (1.695 m)  Wt 144 lb 8 oz (65.545 kg)  BMI 22.81 kg/m2 Constitutional: generally well-appearing Psychiatric: alert and oriented x3 Abdomen: soft, nontender, nondistended, no obvious ascites, no peritoneal signs, normal bowel sounds   Assessment and plan: 79 y.o. female with  small bowel ulcer but presented as a small bowel mass causing iron deficiency anemia unclear etiology however she has really no history to  suggest inflammatory bowel disease and I don't think she needs any workup for it given colonoscopy findings, upper endoscopy findings, CT scan, operative report that found no other evidence of inflammatory bowel disease. She does not take NSAIDs routinely. She knows to call here if she has any new GI issues. She will stay off iron for now and I will have repeat CBC in 2 months. If her counts are back to normal then I would probably repeat again in 6 months time and if normal still then we could forego further surveillance labs.   Owens Loffler, MD [facility] 02/24/2015, 2:06 PM

## 2015-02-24 NOTE — Patient Instructions (Signed)
Stay off iron. Repeat cbc in 2 months. Unclear etiology of your small bowel ulceration but you do not at all seem to have Crohn's disease.

## 2015-04-03 DIAGNOSIS — Z23 Encounter for immunization: Secondary | ICD-10-CM | POA: Diagnosis not present

## 2015-04-28 ENCOUNTER — Other Ambulatory Visit (INDEPENDENT_AMBULATORY_CARE_PROVIDER_SITE_OTHER): Payer: Medicare PPO

## 2015-04-28 DIAGNOSIS — K633 Ulcer of intestine: Secondary | ICD-10-CM

## 2015-04-28 LAB — CBC WITH DIFFERENTIAL/PLATELET
Basophils Absolute: 0 10*3/uL (ref 0.0–0.1)
Basophils Relative: 0.1 % (ref 0.0–3.0)
EOS ABS: 0 10*3/uL (ref 0.0–0.7)
EOS PCT: 0.6 % (ref 0.0–5.0)
HEMATOCRIT: 34 % — AB (ref 36.0–46.0)
HEMOGLOBIN: 11.6 g/dL — AB (ref 12.0–15.0)
LYMPHS PCT: 37.3 % (ref 12.0–46.0)
Lymphs Abs: 2.1 10*3/uL (ref 0.7–4.0)
MCHC: 34.2 g/dL (ref 30.0–36.0)
MCV: 83.4 fl (ref 78.0–100.0)
MONO ABS: 0.6 10*3/uL (ref 0.1–1.0)
Monocytes Relative: 10.7 % (ref 3.0–12.0)
Neutro Abs: 2.9 10*3/uL (ref 1.4–7.7)
Neutrophils Relative %: 51.3 % (ref 43.0–77.0)
PLATELETS: 183 10*3/uL (ref 150.0–400.0)
RBC: 4.08 Mil/uL (ref 3.87–5.11)
RDW: 13.6 % (ref 11.5–15.5)
WBC: 5.6 10*3/uL (ref 4.0–10.5)

## 2015-06-19 DIAGNOSIS — J40 Bronchitis, not specified as acute or chronic: Secondary | ICD-10-CM | POA: Diagnosis not present

## 2015-06-19 DIAGNOSIS — J0100 Acute maxillary sinusitis, unspecified: Secondary | ICD-10-CM | POA: Diagnosis not present

## 2015-09-30 ENCOUNTER — Encounter: Payer: Medicare PPO | Admitting: Family Medicine

## 2015-10-01 ENCOUNTER — Encounter: Payer: Self-pay | Admitting: Family Medicine

## 2015-10-01 ENCOUNTER — Ambulatory Visit (INDEPENDENT_AMBULATORY_CARE_PROVIDER_SITE_OTHER): Payer: Medicare Other | Admitting: Family Medicine

## 2015-10-01 VITALS — BP 120/84 | Temp 98.6°F | Ht 67.0 in | Wt 145.0 lb

## 2015-10-01 DIAGNOSIS — Z0000 Encounter for general adult medical examination without abnormal findings: Secondary | ICD-10-CM

## 2015-10-01 DIAGNOSIS — E039 Hypothyroidism, unspecified: Secondary | ICD-10-CM

## 2015-10-01 DIAGNOSIS — D490 Neoplasm of unspecified behavior of digestive system: Secondary | ICD-10-CM

## 2015-10-01 DIAGNOSIS — I340 Nonrheumatic mitral (valve) insufficiency: Secondary | ICD-10-CM | POA: Diagnosis not present

## 2015-10-01 DIAGNOSIS — I10 Essential (primary) hypertension: Secondary | ICD-10-CM | POA: Diagnosis not present

## 2015-10-01 DIAGNOSIS — D379 Neoplasm of uncertain behavior of digestive organ, unspecified: Secondary | ICD-10-CM | POA: Diagnosis not present

## 2015-10-01 LAB — CBC WITH DIFFERENTIAL/PLATELET
BASOS PCT: 0.3 % (ref 0.0–3.0)
Basophils Absolute: 0 10*3/uL (ref 0.0–0.1)
Eosinophils Absolute: 0 10*3/uL (ref 0.0–0.7)
Eosinophils Relative: 0.3 % (ref 0.0–5.0)
HCT: 37.3 % (ref 36.0–46.0)
HEMOGLOBIN: 12.9 g/dL (ref 12.0–15.0)
LYMPHS ABS: 1.7 10*3/uL (ref 0.7–4.0)
Lymphocytes Relative: 22 % (ref 12.0–46.0)
MCHC: 34.5 g/dL (ref 30.0–36.0)
MCV: 87.2 fl (ref 78.0–100.0)
MONO ABS: 0.7 10*3/uL (ref 0.1–1.0)
Monocytes Relative: 8.5 % (ref 3.0–12.0)
Neutro Abs: 5.3 10*3/uL (ref 1.4–7.7)
Neutrophils Relative %: 68.9 % (ref 43.0–77.0)
Platelets: 190 10*3/uL (ref 150.0–400.0)
RBC: 4.28 Mil/uL (ref 3.87–5.11)
RDW: 14.7 % (ref 11.5–15.5)
WBC: 7.7 10*3/uL (ref 4.0–10.5)

## 2015-10-01 LAB — POCT URINALYSIS DIPSTICK
BILIRUBIN UA: NEGATIVE
Glucose, UA: NEGATIVE
Ketones, UA: NEGATIVE
NITRITE UA: NEGATIVE
Spec Grav, UA: 1.01
UROBILINOGEN UA: 0.2
pH, UA: 6

## 2015-10-01 LAB — BASIC METABOLIC PANEL
BUN: 12 mg/dL (ref 6–23)
CALCIUM: 9.4 mg/dL (ref 8.4–10.5)
CHLORIDE: 100 meq/L (ref 96–112)
CO2: 25 meq/L (ref 19–32)
Creatinine, Ser: 0.96 mg/dL (ref 0.40–1.20)
GFR: 59.86 mL/min — ABNORMAL LOW (ref >60.00)
GLUCOSE: 91 mg/dL (ref 70–99)
Potassium: 3.8 mEq/L (ref 3.5–5.1)
SODIUM: 134 meq/L — AB (ref 135–145)

## 2015-10-01 LAB — TSH: TSH: 4.36 u[IU]/mL (ref 0.35–4.50)

## 2015-10-01 MED ORDER — LEVOTHYROXINE SODIUM 50 MCG PO TABS: ORAL_TABLET | ORAL | Status: DC

## 2015-10-01 MED ORDER — LISINOPRIL 10 MG PO TABS: ORAL_TABLET | ORAL | Status: DC

## 2015-10-01 NOTE — Progress Notes (Signed)
Pre visit review using our clinic review tool, if applicable. No additional management support is needed unless otherwise documented below in the visit note. 

## 2015-10-01 NOTE — Progress Notes (Signed)
Subjective:    Patient ID: Christy Wade, female    DOB: Jun 11, 1936, 80 y.o.   MRN: [id number]  HPI Christy Wade is a 80 year old married female nonsmoker who comes in today for general physical examination because of a history of hypothyroidism hypertension  She takes Synthroid 50 g daily for hypothyroidism. We'll check labs today  She takes lisinopril 10 mg daily for hypertension BP today is 120/84  Last year she came in with symptoms of fatigue. Exam showed a severe anemia. Subsequent workup showed a possible malignancy in her small bowel. It surgery return to be an ulcer. She had 18 inches of her small bowel resected May 13. She recovered with no sequelae. She was subsequently put on oral iron by Dr. Ardis Hughs her gastroenterologist. Last CBC in the fall 2016 was back almost back to normal.  She gets routine eye care, dental care, BSE monthly, annual mammography, colonoscopy 2016 was normal  Vaccinations up-to-date  Cognitive function normal she walks daily home health safety reviewed no issues identified, no guns in the house, she does have a healthcare power of attorney and living well  She had her uterus removed in 1975 for fibroids. Ovaries were left intact. GYN wise she is asymptomatic therefore pelvic exams not indicated   Review of Systems  Constitutional: Negative.   HENT: Negative.   Eyes: Negative.   Respiratory: Negative.   Cardiovascular: Negative.   Gastrointestinal: Negative.   Endocrine: Negative.   Genitourinary: Negative.   Musculoskeletal: Negative.   Skin: Negative.   Allergic/Immunologic: Negative.   Neurological: Negative.   Hematological: Negative.   Psychiatric/Behavioral: Negative.        Objective:   Physical Exam  Constitutional: She appears well-developed and well-nourished.  HENT:  Head: Normocephalic and atraumatic.  Right Ear: External ear normal.  Left Ear: External ear normal.  Nose: Nose normal.  Mouth/Throat: Oropharynx is clear and  moist.  Eyes: EOM are normal. Pupils are equal, round, and reactive to light.  Neck: Normal range of motion. Neck supple. No JVD present. No tracheal deviation present. No thyromegaly present.  Cardiovascular: Normal rate, regular rhythm, normal heart sounds and intact distal pulses.  Exam reveals no gallop and no friction rub.   No murmur heard. No carotid nor aortic bruits peripheral pulses 2+ and symmetrical  Pulmonary/Chest: Effort normal and breath sounds normal. No stridor. No respiratory distress. She has no wheezes. She has no rales. She exhibits no tenderness.  Abdominal: Soft. Bowel sounds are normal. She exhibits no distension and no mass. There is no tenderness. There is no rebound and no guarding.  Genitourinary:  Bilateral breast exam normal  Musculoskeletal: Normal range of motion.  Lymphadenopathy:    She has no cervical adenopathy.  Neurological: She is alert. She has normal reflexes. No cranial nerve deficit. She exhibits normal muscle tone. Coordination normal.  Skin: Skin is warm and dry. No rash noted. No erythema. No pallor.  Total body skin exam normal well-healed scar. Umbilical from previous small bowel resection May 2016. The rest of her skin looks fine. She has a garden variety of freckles Mollo's a lot of seborrheic keratoses  Psychiatric: She has a normal mood and affect. Her behavior is normal. Judgment and thought content normal.  Nursing note and vitals reviewed.         Assessment & Plan:  Hypothyroidism........ continue Synthroid  Hypertension....... continue lisinopril  .... Status post 18 inches of small bowel removed May 2016...Marland KitchenMarland KitchenMarland Kitchen final path was an ulcer no malignancy

## 2015-10-01 NOTE — Patient Instructions (Signed)
Continue current medications diet and exercise  Return in one year sooner if any problem  Tommi Rumps or Almyra Free are 2 new adult nurse practitioner sure Dr. Martinique

## 2015-10-06 DIAGNOSIS — R319 Hematuria, unspecified: Secondary | ICD-10-CM | POA: Diagnosis not present

## 2015-10-09 LAB — URINE CULTURE: Colony Count: >=100000

## 2015-10-15 ENCOUNTER — Other Ambulatory Visit: Payer: Self-pay | Admitting: Family Medicine

## 2015-10-15 DIAGNOSIS — Z87440 Personal history of urinary (tract) infections: Secondary | ICD-10-CM

## 2015-10-15 MED ORDER — SULFAMETHOXAZOLE-TRIMETHOPRIM 800-160 MG PO TABS: 1.0000 | ORAL_TABLET | Freq: Two times a day (BID) | ORAL | Status: DC

## 2015-10-16 ENCOUNTER — Telehealth: Payer: Self-pay | Admitting: Family Medicine

## 2015-10-16 MED ORDER — AMOXICILLIN 875 MG PO TABS: 875.0000 mg | ORAL_TABLET | Freq: Two times a day (BID) | ORAL | Status: DC

## 2015-10-16 NOTE — Telephone Encounter (Signed)
Waiting on reply from Dr. Sherren Mocha.

## 2015-10-16 NOTE — Telephone Encounter (Signed)
Message back from Dr. Sherren Mocha.  Advised amoxicillin 875 bid #20 (10 days). Sent to the pharmacy. Tried to reach the pt of both numbers.  Received a message stating no voicemail box for both numbers. Will try again at a later time.

## 2015-10-16 NOTE — Telephone Encounter (Signed)
Pt needs another abx send to [facility] in [place]. Pt is allergic to sulfa drugs. Pt is aware cma and md out of office until monday

## 2015-10-16 NOTE — Telephone Encounter (Signed)
Spoke to the pt.  She has picked up the prescription at the pharmacy.

## 2015-10-16 NOTE — Telephone Encounter (Signed)
Spoke to the patient's husband.  She is not home.  He asked me to call back after lunch.

## 2015-10-28 ENCOUNTER — Other Ambulatory Visit: Payer: Self-pay

## 2015-10-28 DIAGNOSIS — D649 Anemia, unspecified: Secondary | ICD-10-CM

## 2015-10-29 NOTE — Progress Notes (Signed)
Order(s) created erroneously. Erroneous order ID: [id number]  Order moved by: Milas Hock  Order move date/time: 10/29/2015 4:42 PM  Source Patient: [id number]  Source Contact: 10/06/2015  Destination Patient: [id number]  Destination Contact: 10/01/2015

## 2015-11-06 ENCOUNTER — Telehealth: Payer: Self-pay | Admitting: Gastroenterology

## 2015-11-06 NOTE — Telephone Encounter (Signed)
Pt has been notified she will need to come in for labs, she will be in this week

## 2015-11-07 ENCOUNTER — Other Ambulatory Visit (INDEPENDENT_AMBULATORY_CARE_PROVIDER_SITE_OTHER): Payer: Medicare Other

## 2015-11-07 DIAGNOSIS — D649 Anemia, unspecified: Secondary | ICD-10-CM | POA: Diagnosis not present

## 2015-11-07 LAB — CBC WITH DIFFERENTIAL/PLATELET
BASOS ABS: 0 10*3/uL (ref 0.0–0.1)
Basophils Relative: 0.3 % (ref 0.0–3.0)
EOS ABS: 0.1 10*3/uL (ref 0.0–0.7)
Eosinophils Relative: 0.9 % (ref 0.0–5.0)
HCT: 36.3 % (ref 36.0–46.0)
Hemoglobin: 12.3 g/dL (ref 12.0–15.0)
LYMPHS ABS: 2 10*3/uL (ref 0.7–4.0)
Lymphocytes Relative: 32.2 % (ref 12.0–46.0)
MCHC: 33.8 g/dL (ref 30.0–36.0)
MCV: 87.9 fl (ref 78.0–100.0)
MONO ABS: 0.6 10*3/uL (ref 0.1–1.0)
Monocytes Relative: 10.2 % (ref 3.0–12.0)
NEUTROS ABS: 3.5 10*3/uL (ref 1.4–7.7)
Neutrophils Relative %: 56.4 % (ref 43.0–77.0)
PLATELETS: 180 10*3/uL (ref 150.0–400.0)
RBC: 4.12 Mil/uL (ref 3.87–5.11)
RDW: 14.1 % (ref 11.5–15.5)
WBC: 6.1 10*3/uL (ref 4.0–10.5)

## 2015-11-27 ENCOUNTER — Other Ambulatory Visit (INDEPENDENT_AMBULATORY_CARE_PROVIDER_SITE_OTHER): Payer: Medicare Other

## 2015-11-27 DIAGNOSIS — Z87440 Personal history of urinary (tract) infections: Secondary | ICD-10-CM

## 2015-11-27 LAB — POCT URINALYSIS DIPSTICK
BILIRUBIN UA: NEGATIVE
Glucose, UA: NEGATIVE
Ketones, UA: NEGATIVE
Nitrite, UA: NEGATIVE
PH UA: 6
Protein, UA: NEGATIVE
Spec Grav, UA: <=1.005
UROBILINOGEN UA: 0.2

## 2015-11-29 LAB — URINE CULTURE: Colony Count: >=100000

## 2015-12-01 ENCOUNTER — Other Ambulatory Visit: Payer: Self-pay | Admitting: *Deleted

## 2015-12-01 MED ORDER — CIPROFLOXACIN HCL 500 MG PO TABS: 500.0000 mg | ORAL_TABLET | Freq: Two times a day (BID) | ORAL | Status: DC

## 2015-12-02 ENCOUNTER — Telehealth: Payer: Self-pay | Admitting: General Practice

## 2015-12-02 NOTE — Telephone Encounter (Signed)
Christy Wade, CMA reported lab results to patient's husband on 5/15 @ 3:23.  Antibiotic was called to pharmacy. (See lab notes)

## 2015-12-15 LAB — HM MAMMOGRAPHY

## 2016-03-11 ENCOUNTER — Telehealth: Payer: Self-pay

## 2016-03-11 NOTE — Telephone Encounter (Signed)
Patient is on the list for [facility] 2017 and may be a good candidate for an AWV in 2017. Please let me know if/when appt is scheduled.   

## 2016-05-22 ENCOUNTER — Encounter: Payer: Self-pay | Admitting: Family Medicine

## 2016-05-22 ENCOUNTER — Ambulatory Visit (INDEPENDENT_AMBULATORY_CARE_PROVIDER_SITE_OTHER): Payer: Medicare Other | Admitting: Family Medicine

## 2016-05-22 VITALS — BP 138/82 | HR 82 | Temp 98.1°F | Wt 154.8 lb

## 2016-05-22 DIAGNOSIS — R300 Dysuria: Secondary | ICD-10-CM

## 2016-05-22 DIAGNOSIS — N760 Acute vaginitis: Secondary | ICD-10-CM

## 2016-05-22 LAB — POC URINALSYSI DIPSTICK (AUTOMATED)
Bilirubin, UA: NEGATIVE
Blood, UA: NEGATIVE
Glucose, UA: NEGATIVE
Nitrite, UA: POSITIVE
PH UA: 6
SPEC GRAV UA: 1.01
Urobilinogen, UA: 0.2

## 2016-05-22 MED ORDER — FLUCONAZOLE 150 MG PO TABS: 150.0000 mg | ORAL_TABLET | ORAL | 0 refills | Status: DC

## 2016-05-22 NOTE — Patient Instructions (Signed)
Presumed yeast infection.  Use diflucan.  Update your regular doc as needed.  Take care.  Glad to see you.

## 2016-05-22 NOTE — Progress Notes (Signed)
Pre visit review using our clinic review tool, if applicable. No additional management support is needed unless otherwise documented below in the visit note. 

## 2016-05-22 NOTE — Assessment & Plan Note (Signed)
Likely by hx.  D/w pt.   Okay to use diflucan and f/u prn.   Likely u/a contaminated.   F/u prn.

## 2016-05-22 NOTE — Progress Notes (Signed)
burning and stinging externally, vaginally.  Had used some topical cream, vagisil with some relief.   No burning with urination but she has urinary frequency at baseline.  duration of symptoms: about 1 week.  abdominal pain: no Fevers: no back pain: no Vomiting: no  Meds, vitals, and allergies reviewed.   Per HPI unless specifically indicated in ROS section   GEN: nad, alert and oriented NECK: supple CV: rrr.  PULM: ctab, no inc wob ABD: soft, +bs, not ttp.  EXT: no edema Pelvic exam deferred.  BACK: no CVA pain

## 2016-08-14 ENCOUNTER — Encounter: Payer: Self-pay | Admitting: Physician Assistant

## 2016-08-14 ENCOUNTER — Ambulatory Visit (INDEPENDENT_AMBULATORY_CARE_PROVIDER_SITE_OTHER): Payer: Medicare Other | Admitting: Physician Assistant

## 2016-08-14 VITALS — BP 136/82 | HR 89 | Temp 97.7°F | Wt 150.0 lb

## 2016-08-14 DIAGNOSIS — M25561 Pain in right knee: Secondary | ICD-10-CM | POA: Diagnosis not present

## 2016-08-14 NOTE — Progress Notes (Signed)
Patient presents to clinic today c/o 1 week of R knee pain described as aching and worse with flexion and weight-bearing. Pain is averaging 4/10 at present. Denies trauma or injury to the area. Does note some swelling more so in the evening. Denies buckling or weakness of lower extremity. Endorses history of similar symptoms off and on - has history of OA s/p arthroscopy in 2009.  Patient has taken Advil with some relief of the pain.   Past Medical History:  Diagnosis Date  . Anemia    on iron now  . Hypertension   . Hypothyroidism   . Osteoporosis   . PONV (postoperative nausea and vomiting)   . Thyroid disease   . Varicose vein     Current Outpatient Prescriptions on File Prior to Visit  Medication Sig Dispense Refill  . CALCIUM-VITAMIN D PO Take 1 tablet by mouth daily after lunch.    . cyclobenzaprine (FLEXERIL) 5 MG tablet 1 by mouth daily at bedtime when necessary 30 tablet 2  . Ferrous Sulfate (IRON) 325 (65 FE) MG TABS Take by mouth 2 (two) times daily. Reported on 10/01/2015    . levothyroxine (SYNTHROID, LEVOTHROID) 50 MCG tablet TAKE ONE TABLET BY MOUTH EVERY MORNING 100 tablet 3  . lisinopril (PRINIVIL,ZESTRIL) 10 MG tablet TAKE ONE TABLET BY MOUTH DAILY 100 tablet 3  . Multiple Vitamin (MULTIVITAMIN) tablet Take 1 tablet by mouth daily with lunch.     . polyvinyl alcohol (LIQUIFILM TEARS) 1.4 % ophthalmic solution Place 1-2 drops into both eyes daily as needed for dry eyes.     No current facility-administered medications on file prior to visit.     Allergies  Allergen Reactions  . Codeine Nausea Only    REACTION: nausea  . Sulfamethoxazole Rash    REACTION: rash    Family History  Problem Relation Age of Onset  . Bone cancer Mother   . Diabetes Father   . Diabetes Other   . Cancer Other     bone, breast  . Stroke Other   . Breast cancer Sister     Social History   Social History  . Marital status: Married    Spouse name: N/A  . Number of children:  N/A  . Years of education: N/A   Social History Main Topics  . Smoking status: Never Smoker  . Smokeless tobacco: Never Used  . Alcohol use No  . Drug use: No  . Sexual activity: Not Asked   Other Topics Concern  . None   Social History Narrative  . None   Review of Systems - See HPI.  All other ROS are negative.  BP 136/82   Pulse 89   Temp 97.7 F (36.5 C) (Oral)   Wt 150 lb (68 kg)   SpO2 98%   BMI 23.49 kg/m   Physical Exam  Constitutional: She is oriented to person, place, and time and well-developed, well-nourished, and in no distress.  HENT:  Head: Normocephalic and atraumatic.  Eyes: Conjunctivae are normal.  Cardiovascular: Normal rate, regular rhythm, normal heart sounds and intact distal pulses.   Pulmonary/Chest: Effort normal.  Musculoskeletal:       Right knee: She exhibits normal range of motion, no swelling, no erythema, normal patellar mobility and no bony tenderness. Tenderness found. Medial joint line tenderness noted.  Pain with extension of knee > 45 degrees. Normal flexion without pain. No increased pain with resistance.   Neurological: She is alert and oriented to person, place,  and time.  Skin: Skin is warm and dry. No rash noted.  Psychiatric: Affect normal.  Vitals reviewed.   Recent Results (from the past 2160 hour(s))  POCT Urinalysis Dipstick (Automated)     Status: Abnormal   Collection Time: 05/22/16 12:13 PM  Result Value Ref Range   Color, UA Light Yellow    Clarity, UA Hazy    Glucose, UA Negative    Bilirubin, UA Negative    Ketones, UA Netative    Spec Grav, UA 1.010    Blood, UA Negative    pH, UA 6.0    Protein, UA Trace    Urobilinogen, UA 0.2    Nitrite, UA Positive    Leukocytes, UA moderate (2+) (A) Negative    Assessment/Plan: 1. Right knee pain, unspecified chronicity Acute on chronic in patient with OA s/p arthroscopy. No current follow-up with specialty. Medial pain. RICE. Alternate her tylenol and ibuprofen.  Topical NSAID. Knee brace to wear daily. Follow-up with PCP next week for further assessment, imaging and/or referral to Orthopedics.     Leeanne Rio, PA-C

## 2016-08-14 NOTE — Patient Instructions (Signed)
Please wear knee brace daily. Elevate leg while resting. Start topical cream -- Icy Hot or Aspercreme to the area.  Alternate Tylenol and Ibuprofen for pain.  I am hesitant to give other medications at present due to history of codeine allergy and history of GI bleeds.  Follow-up with your primary care this following week for reassessment. May need imaging and referral.   If you note any acute worsening of symptoms on current regimen, please return immediately or go to the ER.

## 2016-10-06 ENCOUNTER — Encounter: Payer: Medicare Other | Admitting: Family Medicine

## 2016-10-13 ENCOUNTER — Ambulatory Visit (INDEPENDENT_AMBULATORY_CARE_PROVIDER_SITE_OTHER): Payer: Medicare Other | Admitting: Family Medicine

## 2016-10-13 ENCOUNTER — Encounter: Payer: Self-pay | Admitting: Family Medicine

## 2016-10-13 VITALS — BP 135/80 | Temp 98.0°F | Ht 67.0 in | Wt 146.0 lb

## 2016-10-13 DIAGNOSIS — I10 Essential (primary) hypertension: Secondary | ICD-10-CM

## 2016-10-13 DIAGNOSIS — E039 Hypothyroidism, unspecified: Secondary | ICD-10-CM | POA: Diagnosis not present

## 2016-10-13 DIAGNOSIS — Z136 Encounter for screening for cardiovascular disorders: Secondary | ICD-10-CM | POA: Diagnosis not present

## 2016-10-13 DIAGNOSIS — Z0000 Encounter for general adult medical examination without abnormal findings: Secondary | ICD-10-CM

## 2016-10-13 LAB — POCT URINALYSIS DIPSTICK
Bilirubin, UA: NEGATIVE
Blood, UA: NEGATIVE
GLUCOSE UA: NEGATIVE
Ketones, UA: NEGATIVE
PROTEIN UA: NEGATIVE
Spec Grav, UA: 1.01 (ref 1.030–1.035)
Urobilinogen, UA: 0.2 (ref ?–2.0)
pH, UA: 6 (ref 5.0–8.0)

## 2016-10-13 LAB — CBC WITH DIFFERENTIAL/PLATELET
Basophils Absolute: 0 10*3/uL (ref 0.0–0.1)
Basophils Relative: 0.2 % (ref 0.0–3.0)
EOS PCT: 0.5 % (ref 0.0–5.0)
Eosinophils Absolute: 0 10*3/uL (ref 0.0–0.7)
HEMATOCRIT: 38.3 % (ref 36.0–46.0)
Hemoglobin: 13.1 g/dL (ref 12.0–15.0)
LYMPHS ABS: 1.9 10*3/uL (ref 0.7–4.0)
LYMPHS PCT: 39.1 % (ref 12.0–46.0)
MCHC: 34.2 g/dL (ref 30.0–36.0)
MCV: 89.7 fl (ref 78.0–100.0)
Monocytes Absolute: 0.5 10*3/uL (ref 0.1–1.0)
Monocytes Relative: 9.9 % (ref 3.0–12.0)
NEUTROS ABS: 2.5 10*3/uL (ref 1.4–7.7)
Neutrophils Relative %: 50.3 % (ref 43.0–77.0)
Platelets: 193 10*3/uL (ref 150.0–400.0)
RBC: 4.27 Mil/uL (ref 3.87–5.11)
RDW: 13.8 % (ref 11.5–15.5)
WBC: 4.9 10*3/uL (ref 4.0–10.5)

## 2016-10-13 LAB — BASIC METABOLIC PANEL
BUN: 9 mg/dL (ref 6–23)
CO2: 25 mEq/L (ref 19–32)
Calcium: 9.5 mg/dL (ref 8.4–10.5)
Chloride: 99 mEq/L (ref 96–112)
Creatinine, Ser: 0.86 mg/dL (ref 0.40–1.20)
GFR: 67.78 mL/min (ref >60.00)
Glucose, Bld: 91 mg/dL (ref 70–99)
Potassium: 4.3 mEq/L (ref 3.5–5.1)
SODIUM: 132 meq/L — AB (ref 135–145)

## 2016-10-13 LAB — TSH: TSH: 4.23 u[IU]/mL (ref 0.35–4.50)

## 2016-10-13 MED ORDER — LISINOPRIL 10 MG PO TABS: ORAL_TABLET | ORAL | 4 refills | Status: DC

## 2016-10-13 MED ORDER — LEVOTHYROXINE SODIUM 50 MCG PO TABS: ORAL_TABLET | ORAL | 4 refills | Status: DC

## 2016-10-13 MED ORDER — CYCLOBENZAPRINE HCL 5 MG PO TABS: ORAL_TABLET | ORAL | 2 refills | Status: DC

## 2016-10-13 NOTE — Patient Instructions (Signed)
Labs today........Marland Kitchen we will call you if there is anything abnormal  Continue current medications  Follow-up in one year sooner if any problem

## 2016-10-13 NOTE — Progress Notes (Signed)
Pre visit review using our clinic review tool, if applicable. No additional management support is needed unless otherwise documented below in the visit note. 

## 2016-10-13 NOTE — Progress Notes (Addendum)
Christy Wade is a delightful 81 year old married female nonsmoker who comes in today for evaluation of hypertension, hypothyroidism.  Patient is here for annual physical examination    She takes Synthroid 50 g daily because of a history of hypothyroidism. Will check TSH level today  She takes lisinopril 10 mg daily for hypertension. BP 130/80 at home  She takes Flexeril 5 mg daily at bedtime for low back pain.  She gets routine eye care, dental care, BSE monthly, annual mammography, colonoscopy 2017. In 2016 she had a abdominal mass that turned out to be diverticulitis. She had 18 inches of her small bowel removed. She did well and had no problems. Turned out not to be a cancer.  Cognitive function normal she walks daily home health safety reviewed no issues identified, no guns in the house, she does have a healthcare power of attorney and living well  Vaccinations up-to-date except she needs a booster on her Pneumovax.  She had her uterus removed many years ago for nonmalignant reasons. Ovaries were left intact. Asymptomatic.  14 point review of systems June otherwise negative  EKG was done because history of hypertension. EKG was normal and unchanged.  BP 135/80 (BP Location: Right Arm, Patient Position: Sitting, Cuff Size: Normal)   Temp 98 F (36.7 C) (Oral)   Ht 5\' 7"  (1.702 m)   Wt 146 lb (66.2 kg)   BMI 22.87 kg/m  Examination HEENT is negative except for early bilateral cataracts. Neck was supple thyroid is not enlarged no carotid bruits cardiopulmonary exam normal, except for click of MVP abdominal exam normal breast exam normal pelvic and rectal not indicated extremities normal skin normal peripheral pulses normal  #1 hypertension at goal........ continue current therapy  #2 hypothyroidism....... continue Synthroid check labs

## 2016-11-02 ENCOUNTER — Other Ambulatory Visit: Payer: Self-pay | Admitting: Family Medicine

## 2016-11-02 DIAGNOSIS — E039 Hypothyroidism, unspecified: Secondary | ICD-10-CM

## 2016-11-02 DIAGNOSIS — I10 Essential (primary) hypertension: Secondary | ICD-10-CM

## 2016-11-02 NOTE — Telephone Encounter (Signed)
Denied.  Filled on 10/13/16.  Message sent to the pharmacy to check their file.

## 2016-12-20 ENCOUNTER — Telehealth: Payer: Self-pay | Admitting: Family Medicine

## 2016-12-20 ENCOUNTER — Encounter: Payer: Self-pay | Admitting: Family Medicine

## 2016-12-20 NOTE — Telephone Encounter (Signed)
Pt would like to have her results from 10/13/16 and now she is very concerned she has lost her husband since being in the office in March.

## 2016-12-20 NOTE — Telephone Encounter (Signed)
Would forward to ordering provider as well, but look ok for the most part. Sodium mildly low. Mild findings on urine dip - if any urinary symptoms would recommend appt to check for infection.

## 2016-12-20 NOTE — Telephone Encounter (Signed)
Dr. Maudie Mercury, could you please look at labs and advise?

## 2016-12-21 NOTE — Telephone Encounter (Signed)
Pt notified of results by telephone.  No urinary sx at this time.

## 2017-01-11 NOTE — Telephone Encounter (Signed)
Error

## 2017-04-09 ENCOUNTER — Other Ambulatory Visit: Payer: Self-pay | Admitting: Family Medicine

## 2017-04-11 NOTE — Telephone Encounter (Signed)
Electronic refill request.  Last office visit:   10/13/16  Dr. Sherren Mocha Last Filled:   Not on current meds list Please advise.

## 2017-04-12 NOTE — Telephone Encounter (Signed)
I rx'd this once about 1 year ago.  I'll defer to PCP.  Routed.

## 2017-10-18 ENCOUNTER — Encounter: Payer: Medicare Other | Admitting: Family Medicine

## 2017-10-31 ENCOUNTER — Ambulatory Visit (INDEPENDENT_AMBULATORY_CARE_PROVIDER_SITE_OTHER): Payer: Medicare Other | Admitting: Family Medicine

## 2017-10-31 ENCOUNTER — Encounter: Payer: Self-pay | Admitting: Family Medicine

## 2017-10-31 VITALS — BP 135/85 | HR 86 | Temp 97.7°F | Ht 67.0 in | Wt 151.0 lb

## 2017-10-31 DIAGNOSIS — Z0000 Encounter for general adult medical examination without abnormal findings: Secondary | ICD-10-CM

## 2017-10-31 DIAGNOSIS — E039 Hypothyroidism, unspecified: Secondary | ICD-10-CM | POA: Diagnosis not present

## 2017-10-31 DIAGNOSIS — I10 Essential (primary) hypertension: Secondary | ICD-10-CM

## 2017-10-31 LAB — HEPATIC FUNCTION PANEL
ALT: 11 U/L (ref 0–35)
AST: 17 U/L (ref 0–37)
Albumin: 4 g/dL (ref 3.5–5.2)
Alkaline Phosphatase: 60 U/L (ref 39–117)
BILIRUBIN TOTAL: 0.9 mg/dL (ref 0.2–1.2)
Bilirubin, Direct: 0.2 mg/dL (ref 0.0–0.3)
Total Protein: 7.6 g/dL (ref 6.0–8.3)

## 2017-10-31 LAB — POCT URINALYSIS DIPSTICK
Bilirubin, UA: NEGATIVE
Blood, UA: NEGATIVE
GLUCOSE UA: NEGATIVE
Ketones, UA: NEGATIVE
Nitrite, UA: NEGATIVE
Protein, UA: NEGATIVE
Spec Grav, UA: 1.01 (ref 1.010–1.025)
Urobilinogen, UA: 0.2 E.U./dL
pH, UA: 6 (ref 5.0–8.0)

## 2017-10-31 LAB — CBC WITH DIFFERENTIAL/PLATELET
BASOS PCT: 0.3 % (ref 0.0–3.0)
Basophils Absolute: 0 10*3/uL (ref 0.0–0.1)
EOS PCT: 0.8 % (ref 0.0–5.0)
Eosinophils Absolute: 0 10*3/uL (ref 0.0–0.7)
HEMATOCRIT: 35.5 % — AB (ref 36.0–46.0)
HEMOGLOBIN: 12.1 g/dL (ref 12.0–15.0)
LYMPHS PCT: 28.2 % (ref 12.0–46.0)
Lymphs Abs: 1.5 10*3/uL (ref 0.7–4.0)
MCHC: 34 g/dL (ref 30.0–36.0)
MCV: 88.4 fl (ref 78.0–100.0)
Monocytes Absolute: 0.5 10*3/uL (ref 0.1–1.0)
Monocytes Relative: 8.6 % (ref 3.0–12.0)
Neutro Abs: 3.4 10*3/uL (ref 1.4–7.7)
Neutrophils Relative %: 62.1 % (ref 43.0–77.0)
Platelets: 181 10*3/uL (ref 150.0–400.0)
RBC: 4.02 Mil/uL (ref 3.87–5.11)
RDW: 13.8 % (ref 11.5–15.5)
WBC: 5.4 10*3/uL (ref 4.0–10.5)

## 2017-10-31 LAB — LIPID PANEL
Cholesterol: 163 mg/dL (ref 0–200)
HDL: 82.5 mg/dL (ref >39.00)
LDL CALC: 69 mg/dL (ref 0–99)
NONHDL: 80.72
Total CHOL/HDL Ratio: 2
Triglycerides: 61 mg/dL (ref 0.0–149.0)
VLDL: 12.2 mg/dL (ref 0.0–40.0)

## 2017-10-31 LAB — BASIC METABOLIC PANEL
BUN: 11 mg/dL (ref 6–23)
CO2: 23 meq/L (ref 19–32)
Calcium: 9.1 mg/dL (ref 8.4–10.5)
Chloride: 101 mEq/L (ref 96–112)
Creatinine, Ser: 0.9 mg/dL (ref 0.40–1.20)
GFR: 64.14 mL/min (ref >60.00)
Glucose, Bld: 81 mg/dL (ref 70–99)
POTASSIUM: 4 meq/L (ref 3.5–5.1)
SODIUM: 131 meq/L — AB (ref 135–145)

## 2017-10-31 LAB — TSH: TSH: 3.99 u[IU]/mL (ref 0.35–4.50)

## 2017-10-31 MED ORDER — LISINOPRIL 10 MG PO TABS: ORAL_TABLET | ORAL | 4 refills | Status: DC

## 2017-10-31 MED ORDER — LEVOTHYROXINE SODIUM 50 MCG PO TABS: ORAL_TABLET | ORAL | 4 refills | Status: DC

## 2017-10-31 NOTE — Progress Notes (Signed)
A February the neurologist withLula is a 82 year old widowed female nonsmoker ........ Husband died last year combination of sepsis with underlying leukemia........ Who comes in today for general physical examination because a history of hypothyroidism and hypertension Her blood pressures been well maintained on lisinopril 10 mg daily. BP at home 135/85 or lessShe takes Synthroid 50 g daily for hyperal thyroidism. Check TSH today  She gets routine eye care, dental care, colonoscopy 2016 normal. This was done because of a history of anemia. She presented with anemia and rectal bleeding. Colonoscopy was normal. She went to surgery and had part of her small bowel respected. It turned out to be an ulcer in the small bowel. She recovered with no sequelae.  Vaccinations up-to-date information given on the new shingles vaccine. Due for follow-up tetanus booster October 2019  14 point review of systems otherwise negative  Social history........ Husband died last year she lives next her sister is a retired Marine scientist. She walks every day and is involved with her church and plays the piano.  She went to see an orthopedist last year about pain in her right hip. She was told she has some arthritis and take some Motrin  EKG done was because a history of hypertension. EKG was normal and take  BP 135/85 (BP Location: Left Arm, Patient Position: Sitting, Cuff Size: Normal)   Pulse 86   Temp 97.7 F (36.5 C) (Oral)   Ht 5\' 7"  (1.702 m)   Wt 151 lb (68.5 kg)   BMI 23.65 kg/m  Well-developed well-nourished female no acute distress vital signs stable she's afebrile HEENT were negative except for bilateral cataracts to juvenile. Neck was supple thyroid not enlarged no carotid bruits cardiopulmonary exam normal breast exam normal abdominal exam normal. Pelvic and rectal not indicated Extremities normal skin normal peripheral pulses normal  #1 hypertension ago...Marland KitchenMarland KitchenThe current medical regimen is effective;  continue  present plan and medications. Current therapy  #2 hypothyroidism..... Continue current therapy check labs

## 2017-10-31 NOTE — Patient Instructions (Signed)
..   Labs today.........Marland Kitchen Will cause is anything abnormal  Continue current medications  Continue good diet Size habits  Call your insurance company to find out where and get the shingles vaccine  Get a follow-up tetanus booster the first week in November next fall  Return in one year for general physical exam sooner if any problems

## 2017-12-26 ENCOUNTER — Other Ambulatory Visit: Payer: Self-pay | Admitting: Family Medicine

## 2017-12-26 DIAGNOSIS — I10 Essential (primary) hypertension: Secondary | ICD-10-CM

## 2018-02-02 ENCOUNTER — Telehealth: Payer: Self-pay | Admitting: Family Medicine

## 2018-02-02 NOTE — Telephone Encounter (Signed)
This is Dr Sherren Mocha pt, requests her lab results from April, Pt wants me to call back with results. Thank you

## 2018-02-02 NOTE — Telephone Encounter (Signed)
No result notes in Epic. Please review with Dr. Sherren Mocha when he comes in.

## 2018-02-02 NOTE — Telephone Encounter (Signed)
Copied from [place] [id number]. Topic: Quick Communication - See Telephone Encounter >> Feb 02, 2018  8:52 AM Bea Graff, NT wrote: CRM for notification. See Telephone encounter for: 02/02/18. Pt would like a call to go over her April lab results.

## 2018-02-06 NOTE — Telephone Encounter (Signed)
Sodium was slightly decreased from normal and pt appears to have had a UTI when the labs were obtained by pcp in April.  Other labs were normal.

## 2018-02-06 NOTE — Telephone Encounter (Signed)
Spoke with pt voiced understanding of her lab results 

## 2018-03-26 ENCOUNTER — Other Ambulatory Visit: Payer: Self-pay | Admitting: Family Medicine

## 2018-04-24 ENCOUNTER — Ambulatory Visit: Payer: Medicare Other | Admitting: Family Medicine

## 2018-04-25 ENCOUNTER — Encounter: Payer: Self-pay | Admitting: Family Medicine

## 2018-04-25 ENCOUNTER — Ambulatory Visit: Payer: Medicare Other | Admitting: Family Medicine

## 2018-04-25 VITALS — BP 160/90 | HR 100 | Temp 97.7°F | Wt 148.1 lb

## 2018-04-25 DIAGNOSIS — I10 Essential (primary) hypertension: Secondary | ICD-10-CM

## 2018-04-25 MED ORDER — LISINOPRIL 20 MG PO TABS: 20.0000 mg | ORAL_TABLET | Freq: Every day | ORAL | 3 refills | Status: DC

## 2018-04-25 NOTE — Progress Notes (Signed)
Christy Wade is a delightful 82 year old widowed female non-smoker comes in accompanied by her Sister Christy Wade who is an Therapist, sports for evaluation of hypertension  For 3 days at the end of September she stopped her medicine lisinopril 10 mg daily because she did not feel well.  She then noticed her blood pressure went up.  She restarted her lisinopril a week ago but her pressure still has not dropped back to normal.  Dates running 160/90.  BP (!) 160/90 (BP Location: Right Arm, Patient Position: Sitting, Cuff Size: Normal)   Pulse 100   Temp 97.7 F (36.5 C) (Oral)   Wt 148 lb 1.6 oz (67.2 kg)   SpO2 100%   BMI 23.20 kg/m  Well-developed will nourished female no acute distress vital signs stable except for BP elevated as noted above  Impression hypertension not at goal....... increase lisinopril to 20 mg daily..... Monitor BP twice daily....... cautioned about low blood pressure getting up at night lightheadedness etc. if blood pressure drops too low stop the medication and call

## 2018-04-25 NOTE — Patient Instructions (Signed)
Increase the lisinopril to 20 mg daily in the morning  Check your blood pressure twice daily  Fax me the data in 3 weeks  If during this process she become lightheaded when you stand up or your blood pressure drops too low as we discussed stop the medication for a day or 2 and go back to 10 mg daily.

## 2018-05-24 ENCOUNTER — Telehealth: Payer: Self-pay | Admitting: Family Medicine

## 2018-05-24 NOTE — Telephone Encounter (Signed)
Pt was advise to take Motrin OTC 400mg  2tablets in the am and 2 tablets pm per Dr.Todd. Pt verbalized understanding. No further action needed!

## 2018-05-24 NOTE — Telephone Encounter (Signed)
Copied from [place] [phone]. Topic: General - Other >> May 24, 2018  9:46 AM Lennox Solders wrote: Reason for CRM:pt would like dr todd to return her call. Pt said she just talk with dr todd and would like him to call something in for her chest wall pain. [facility] in [place] [address]

## 2018-05-29 ENCOUNTER — Ambulatory Visit: Payer: Medicare Other | Admitting: Family Medicine

## 2018-05-29 ENCOUNTER — Encounter: Payer: Self-pay | Admitting: Family Medicine

## 2018-05-29 VITALS — BP 150/90 | HR 95 | Temp 97.6°F | Ht 67.0 in | Wt 146.6 lb

## 2018-05-29 DIAGNOSIS — Z780 Asymptomatic menopausal state: Secondary | ICD-10-CM

## 2018-05-29 DIAGNOSIS — R0789 Other chest pain: Secondary | ICD-10-CM

## 2018-05-29 DIAGNOSIS — I10 Essential (primary) hypertension: Secondary | ICD-10-CM | POA: Diagnosis not present

## 2018-05-29 DIAGNOSIS — G8929 Other chronic pain: Secondary | ICD-10-CM

## 2018-05-29 DIAGNOSIS — Z13820 Encounter for screening for osteoporosis: Secondary | ICD-10-CM

## 2018-05-29 MED ORDER — TRAMADOL HCL 50 MG PO TABS: 50.0000 mg | ORAL_TABLET | Freq: Three times a day (TID) | ORAL | 0 refills | Status: DC | PRN

## 2018-05-29 MED ORDER — AMLODIPINE BESYLATE 5 MG PO TABS: 5.0000 mg | ORAL_TABLET | Freq: Every day | ORAL | 1 refills | Status: DC

## 2018-05-29 NOTE — Assessment & Plan Note (Signed)
-  Continued intermittent chest wall pain.  -Mild kyphosis, may be related to posture.  -Will update DEXA.  -Rx for tramadol to use sparingly, she typically use tylenol or ibuprofen if needed.

## 2018-05-29 NOTE — Patient Instructions (Addendum)
-  It was very nice to meet you today! -Start amlodipine for blood pressure.  -Continue lisinopril at 20mg  -You may use tylenol or ibuprofen for chest wall pain.  Tramadol can be used if needed for severe pain.  -See me again in about 4-6 weeks.

## 2018-05-29 NOTE — Progress Notes (Signed)
Christy Wade - 82 y.o. female MRN [id number]  Date of birth: [birth year]  Subjective Chief Complaint  Patient presents with  . Hypertension    HPI Christy Wade is a 82 y.o. female with history of HTN, anemia, osteoporosis and hypothyroidism here today to establish care with new PCP.  Her current PCP is retiring.  She would like to discuss the following today:  -HTN:  Current treatment with lisinopril 20mg , increased from 10mg  at appt in April.  She is doing well with current medication.  She brings in a list of BP readings from home with range of 140-163/80-90.  She denies symptoms at this time including headache, shortness of breath, palpitations, dizziness, chest pain, or edema.  She does follow a very low sodium diet.   -Chest wall pain:  Diagnosed with chest wall pain a few years ago by pcp.  Reports intermittent flares of pain along posterior chest wall just below R shoulder blade.  Does not like to take medication and will try using something to put pressure on this area or heating pad.  When severe enough she will try ibuprofen or tylenol.  On occasion she has needed to take tramadol, she reports maybe only 4-5x over the past year.  She denies radiation of pain, numbness, tingling.  She does have history of osteoporosis but denies any known trauma to area.  She thinks her posture may be playing a role as well.   ROS:  A comprehensive ROS was completed and negative except as noted per HPI   Allergies  Allergen Reactions  . Codeine Nausea Only    REACTION: nausea  . Sulfamethoxazole Rash    REACTION: rash    Past Medical History:  Diagnosis Date  . Anemia    on iron now  . Hypertension   . Hypothyroidism   . Osteoporosis   . PONV (postoperative nausea and vomiting)   . Thyroid disease   . Varicose vein     Past Surgical History:  Procedure Laterality Date  . ABDOMINAL HYSTERECTOMY  1975   partial  . APPENDECTOMY  1975   with hysterectomy  . LAPAROSCOPIC SMALL  BOWEL RESECTION N/A 11/29/2014   Procedure: LAPAROSCOPIC SMALL INTESTINE RESECTION;  Surgeon: Michael Boston, MD;  Location: [facility];  Service: General;  Laterality: N/A;  . VARICOSE VEIN SURGERY Left 1971    Social History   Socioeconomic History  . Marital status: Widowed    Spouse name: Not on file  . Number of children: Not on file  . Years of education: Not on file  . Highest education level: Not on file  Occupational History  . Not on file  Social Needs  . Financial resource strain: Not on file  . Food insecurity:    Worry: Not on file    Inability: Not on file  . Transportation needs:    Medical: Not on file    Non-medical: Not on file  Tobacco Use  . Smoking status: Never Smoker  . Smokeless tobacco: Never Used  Substance and Sexual Activity  . Alcohol use: No    Alcohol/week: 0.0 standard drinks  . Drug use: No  . Sexual activity: Not on file  Lifestyle  . Physical activity:    Days per week: Not on file    Minutes per session: Not on file  . Stress: Not on file  Relationships  . Social connections:    Talks on phone: Not on file    Gets together: Not on  file    Attends religious service: Not on file    Active member of club or organization: Not on file    Attends meetings of clubs or organizations: Not on file    Relationship status: Not on file  Other Topics Concern  . Not on file  Social History Narrative  . Not on file    Family History  Problem Relation Age of Onset  . Bone cancer Mother   . Diabetes Father   . Diabetes Other   . Cancer Other        bone, breast  . Stroke Other   . Breast cancer Sister     Health Maintenance  Topic Date Due  . DEXA SCAN  07/03/2003  . TETANUS/TDAP  05/15/2018  . INFLUENZA VACCINE  Completed  . PNA vac Low Risk Adult  Completed     ----------------------------------------------------------------------------------------------------------------------------------------------------------------------------------------------------------------- Physical Exam BP (!) 150/90   Pulse 95   Temp 97.6 F (36.4 C) (Oral)   Ht 5\' 7"  (1.702 m)   Wt 146 lb 9.6 oz (66.5 kg)   SpO2 96%   BMI 22.96 kg/m   Physical Exam  Constitutional: She is oriented to person, place, and time. She appears well-nourished. No distress.  HENT:  Head: Normocephalic and atraumatic.  Mouth/Throat: Oropharynx is clear and moist.  Neck: Neck supple. No thyromegaly present.  Cardiovascular: Normal rate, regular rhythm and normal heart sounds.  Pulmonary/Chest: Effort normal and breath sounds normal.  Musculoskeletal: She exhibits no edema.  Lymphadenopathy:    She has no cervical adenopathy.  Neurological: She is alert and oriented to person, place, and time.  Skin: Skin is warm and dry. No rash noted.  Psychiatric: She has a normal mood and affect. Her behavior is normal.    ------------------------------------------------------------------------------------------------------------------------------------------------------------------------------------------------------------------- Assessment and Plan  Essential hypertension -BP remains elevated in clinic and at home.  -Continue lisinopril 20mg  -Add amlodipine 5mg  -F/u in 4-6 weeks.   Chronic chest wall pain -Continued intermittent chest wall pain.  -Mild kyphosis, may be related to posture.  -Will update DEXA.  -Rx for tramadol to use sparingly, she typically use tylenol or ibuprofen if needed.

## 2018-05-29 NOTE — Assessment & Plan Note (Signed)
-  BP remains elevated in clinic and at home.  -Continue lisinopril 20mg  -Add amlodipine 5mg  -F/u in 4-6 weeks.

## 2018-06-05 ENCOUNTER — Other Ambulatory Visit (HOSPITAL_BASED_OUTPATIENT_CLINIC_OR_DEPARTMENT_OTHER): Payer: Medicare Other

## 2018-06-08 ENCOUNTER — Ambulatory Visit (HOSPITAL_BASED_OUTPATIENT_CLINIC_OR_DEPARTMENT_OTHER)
Admission: RE | Admit: 2018-06-08 | Discharge: 2018-06-08 | Disposition: A | Discharge status: Home / Self Care | Payer: Medicare Other | Source: Ambulatory Visit | Attending: Family Medicine | Admitting: Family Medicine

## 2018-06-08 DIAGNOSIS — Z13820 Encounter for screening for osteoporosis: Secondary | ICD-10-CM | POA: Diagnosis not present

## 2018-06-08 DIAGNOSIS — Z780 Asymptomatic menopausal state: Secondary | ICD-10-CM | POA: Diagnosis not present

## 2018-06-08 DIAGNOSIS — M8589 Other specified disorders of bone density and structure, multiple sites: Secondary | ICD-10-CM | POA: Diagnosis not present

## 2018-06-13 NOTE — Progress Notes (Signed)
Bone density test shows thinning of bones (osteopenia) without osteoporosis.   -Recommend calcium and vitamin d supplement daily (500-1000mg  of calcium +1000 IU of vitamin D)

## 2018-07-03 ENCOUNTER — Encounter: Payer: Self-pay | Admitting: Family Medicine

## 2018-07-03 ENCOUNTER — Ambulatory Visit: Payer: Medicare Other | Admitting: Family Medicine

## 2018-07-03 VITALS — BP 138/88 | HR 78 | Temp 97.7°F | Ht 67.0 in | Wt 147.0 lb

## 2018-07-03 DIAGNOSIS — M8580 Other specified disorders of bone density and structure, unspecified site: Secondary | ICD-10-CM

## 2018-07-03 DIAGNOSIS — I10 Essential (primary) hypertension: Secondary | ICD-10-CM

## 2018-07-03 MED ORDER — LEVOTHYROXINE SODIUM 50 MCG PO TABS
ORAL_TABLET | ORAL | 4 refills | Status: DC
Start: 2018-07-03 — End: 2019-08-13

## 2018-07-03 NOTE — Assessment & Plan Note (Signed)
-  BP improved with current medications.  She will continue current meds and monitor at home.  -Follow low salt diet.

## 2018-07-03 NOTE — Progress Notes (Signed)
Christy Wade - 82 y.o. female MRN [id number]  Date of birth: 1936-01-12  Subjective Chief Complaint  Patient presents with  . Hypertension    HPI Christy Wade is 82 y.o. female here today for f/u of HTN.  She is taking lisinopril 20mg  and amlodipine 5mg  was added at last visit.  She is doing well with this.  BP at home ranging from 130-145/70-90.  She denies any side effects from medication and has not had any symptoms related to high blood pressure including chest pain, shortness of breath, palpitations, headache.    She also had a recent bone density scan showing osteopenia.  She is taking Calcium +Vitamin D supplement now.    ROS:  A comprehensive ROS was completed and negative except as noted per HPI  Allergies  Allergen Reactions  . Codeine Nausea Only    REACTION: nausea  . Sulfamethoxazole Rash    REACTION: rash    Past Medical History:  Diagnosis Date  . Anemia    on iron now  . Hypertension   . Hypothyroidism   . Osteoporosis   . PONV (postoperative nausea and vomiting)   . Thyroid disease   . Varicose vein     Past Surgical History:  Procedure Laterality Date  . ABDOMINAL HYSTERECTOMY  1975   partial  . APPENDECTOMY  1975   with hysterectomy  . LAPAROSCOPIC SMALL BOWEL RESECTION N/A 11/29/2014   Procedure: LAPAROSCOPIC SMALL INTESTINE RESECTION;  Surgeon: Michael Boston, MD;  Location: [facility];  Service: General;  Laterality: N/A;  . VARICOSE VEIN SURGERY Left 1971    Social History   Socioeconomic History  . Marital status: Widowed    Spouse name: Not on file  . Number of children: Not on file  . Years of education: Not on file  . Highest education level: Not on file  Occupational History  . Not on file  Social Needs  . Financial resource strain: Not on file  . Food insecurity:    Worry: Not on file    Inability: Not on file  . Transportation needs:    Medical: Not on file    Non-medical: Not on file  Tobacco Use  . Smoking status: Never  Smoker  . Smokeless tobacco: Never Used  Substance and Sexual Activity  . Alcohol use: No    Alcohol/week: 0.0 standard drinks  . Drug use: No  . Sexual activity: Not on file  Lifestyle  . Physical activity:    Days per week: Not on file    Minutes per session: Not on file  . Stress: Not on file  Relationships  . Social connections:    Talks on phone: Not on file    Gets together: Not on file    Attends religious service: Not on file    Active member of club or organization: Not on file    Attends meetings of clubs or organizations: Not on file    Relationship status: Not on file  Other Topics Concern  . Not on file  Social History Narrative  . Not on file    Family History  Problem Relation Age of Onset  . Bone cancer Mother   . Diabetes Father   . Diabetes Other   . Cancer Other        bone, breast  . Stroke Other   . Breast cancer Sister     Health Maintenance  Topic Date Due  . TETANUS/TDAP  05/15/2018  . INFLUENZA VACCINE  Completed  . DEXA SCAN  Completed  . PNA vac Low Risk Adult  Completed    ----------------------------------------------------------------------------------------------------------------------------------------------------------------------------------------------------------------- Physical Exam BP 138/88   Pulse 78   Temp 97.7 F (36.5 C) (Oral)   Ht 5\' 7"  (1.702 m)   Wt 147 lb (66.7 kg)   SpO2 98%   BMI 23.02 kg/m   Physical Exam HENT:     Head: Normocephalic and atraumatic.  Eyes:     General: No scleral icterus. Neck:     Musculoskeletal: Normal range of motion and neck supple.  Cardiovascular:     Rate and Rhythm: Normal rate and regular rhythm.  Pulmonary:     Effort: Pulmonary effort is normal.     Breath sounds: Normal breath sounds.  Skin:    General: Skin is warm and dry.     Findings: No rash.  Neurological:     General: No focal deficit present.     Mental Status: She is alert.  Psychiatric:        Mood  and Affect: Mood normal.        Behavior: Behavior normal.     ------------------------------------------------------------------------------------------------------------------------------------------------------------------------------------------------------------------- Assessment and Plan  Osteopenia -Continue calcium +vitamin D supplement.   Essential hypertension -BP improved with current medications.  She will continue current meds and monitor at home.  -Follow low salt diet.

## 2018-07-03 NOTE — Patient Instructions (Signed)
-  Great to see you! -Continue current medications for blood pressure -Continue Calcium + D supplement, take additional vitamin D supplement as well -Try metamucil every other day - I will see you back in 3 months  -Have a Merry Christmas and Happy New Year!

## 2018-07-03 NOTE — Assessment & Plan Note (Signed)
-  Continue calcium +vitamin D supplement.

## 2018-07-24 ENCOUNTER — Encounter: Payer: Self-pay | Admitting: Family Medicine

## 2018-07-24 ENCOUNTER — Ambulatory Visit: Payer: Medicare Other | Admitting: Family Medicine

## 2018-07-24 VITALS — BP 138/74 | HR 80 | Temp 98.0°F | Ht 67.0 in | Wt 156.0 lb

## 2018-07-24 DIAGNOSIS — R197 Diarrhea, unspecified: Secondary | ICD-10-CM | POA: Diagnosis not present

## 2018-07-24 DIAGNOSIS — R238 Other skin changes: Secondary | ICD-10-CM | POA: Insufficient documentation

## 2018-07-24 MED ORDER — FLUCONAZOLE 150 MG PO TABS: 150.0000 mg | ORAL_TABLET | Freq: Once | ORAL | 0 refills | Status: AC

## 2018-07-24 NOTE — Assessment & Plan Note (Signed)
-  Recommend using wet wipes for irritation and using desitin to area. -May apply topical antifungal bid -Rx for fluconazole for likely associated yeast infection.   -F/u if not improving.

## 2018-07-24 NOTE — Progress Notes (Signed)
Christy Wade - 83 y.o. female MRN [id number]  Date of birth: [birth year]  Subjective Chief Complaint  Patient presents with  . Rash    on her anus and groin area. She had diarrhea which has improved-developed a rash. Has been applying OTC cream. Red, swlloen and itchy    HPI Christy Wade is a 83 y.o. female here today with complaint of recurrent diarrhea and rash on her buttocks.  She had colon surgery around 3-4 years ago and has had issues with recurrent diarrhea since that time.  At the time she was told pathology was benign but may be pre-cursor to UC.  She denies blood in her stool but typically has diarrhea at least 1-2x daily.  She now has irritation on her buttock and perineal area from diarrhea and wiping.  She also has itching associated with this.  She denies fever, vaginal discharge, fever or chills.   She has used cortisone, vagisil and tucks wipes without much improvement.  ROS:  A comprehensive ROS was completed and negative except as noted per HPI  Allergies  Allergen Reactions  . Codeine Nausea Only    REACTION: nausea  . Sulfamethoxazole Rash    REACTION: rash    Past Medical History:  Diagnosis Date  . Anemia    on iron now  . Hypertension   . Hypothyroidism   . Osteoporosis   . PONV (postoperative nausea and vomiting)   . Thyroid disease   . Varicose vein     Past Surgical History:  Procedure Laterality Date  . ABDOMINAL HYSTERECTOMY  1975   partial  . APPENDECTOMY  1975   with hysterectomy  . HEMORROIDECTOMY     [phone]  . LAPAROSCOPIC SMALL BOWEL RESECTION N/A 11/29/2014   Procedure: LAPAROSCOPIC SMALL INTESTINE RESECTION;  Surgeon: Michael Boston, MD;  Location: [facility];  Service: General;  Laterality: N/A;  . VARICOSE VEIN SURGERY Left 1971    Social History   Socioeconomic History  . Marital status: Widowed    Spouse name: Not on file  . Number of children: Not on file  . Years of education: Not on file  . Highest education  level: Not on file  Occupational History  . Not on file  Social Needs  . Financial resource strain: Not on file  . Food insecurity:    Worry: Not on file    Inability: Not on file  . Transportation needs:    Medical: Not on file    Non-medical: Not on file  Tobacco Use  . Smoking status: Never Smoker  . Smokeless tobacco: Never Used  Substance and Sexual Activity  . Alcohol use: No    Alcohol/week: 0.0 standard drinks  . Drug use: No  . Sexual activity: Not on file  Lifestyle  . Physical activity:    Days per week: Not on file    Minutes per session: Not on file  . Stress: Not on file  Relationships  . Social connections:    Talks on phone: Not on file    Gets together: Not on file    Attends religious service: Not on file    Active member of club or organization: Not on file    Attends meetings of clubs or organizations: Not on file    Relationship status: Not on file  Other Topics Concern  . Not on file  Social History Narrative  . Not on file    Family History  Problem Relation Age of Onset  .  Bone cancer Mother   . Diabetes Father   . Diabetes Other   . Cancer Other        bone, breast  . Stroke Other   . Breast cancer Sister     Health Maintenance  Topic Date Due  . TETANUS/TDAP  05/15/2018  . INFLUENZA VACCINE  Completed  . DEXA SCAN  Completed  . PNA vac Low Risk Adult  Completed    ----------------------------------------------------------------------------------------------------------------------------------------------------------------------------------------------------------------- Physical Exam BP 138/74   Pulse 80   Temp 98 F (36.7 C) (Oral)   Ht 5\' 7"  (1.702 m)   Wt 156 lb (70.8 kg)   SpO2 98%   BMI 24.43 kg/m   Physical Exam Exam conducted with a chaperone present (Rebeca Morris).  Constitutional:      General: She is not in acute distress.    Appearance: Normal appearance.  Abdominal:     General: Abdomen is flat. There is  no distension.     Tenderness: There is no abdominal tenderness.  Skin:    General: Skin is warm and dry.     Findings: No rash.     Comments: Skin of perianal area and perineum is erythematous without swelling or drainage.  A couple of small external non-inflamed hemorrhoids are noted.   Neurological:     General: No focal deficit present.     Mental Status: She is alert.  Psychiatric:        Mood and Affect: Mood normal.        Behavior: Behavior normal.     ------------------------------------------------------------------------------------------------------------------------------------------------------------------------------------------------------------------- Assessment and Plan  Skin irritation -Recommend using wet wipes for irritation and using desitin to area. -May apply topical antifungal bid -Rx for fluconazole for likely associated yeast infection.   -F/u if not improving.   Diarrhea -Diarrhea may be related to previous surgery and short gut but some concern about possible IBD causing some of her symptom.  Referral placed to GI.

## 2018-07-24 NOTE — Patient Instructions (Signed)
-  Use wet wipes when wiping, apply desitin afterwards.   -Apply clotrimazole cream twice per day -Let me know if not improving.

## 2018-07-24 NOTE — Assessment & Plan Note (Signed)
-  Diarrhea may be related to previous surgery and short gut but some concern about possible IBD causing some of her symptom.  Referral placed to GI.

## 2018-07-28 ENCOUNTER — Encounter: Payer: Self-pay | Admitting: Family Medicine

## 2018-07-28 ENCOUNTER — Ambulatory Visit: Payer: Medicare Other | Admitting: Family Medicine

## 2018-07-28 DIAGNOSIS — R238 Other skin changes: Secondary | ICD-10-CM

## 2018-07-28 NOTE — Progress Notes (Signed)
Christy Wade - 83 y.o. female MRN [id number]  Date of birth: [birth year]  Subjective Chief Complaint  Patient presents with  . Follow-up    diarrhea has not improved-rash     HPI Christy Wade is a 83 y.o. female here today for follow up of rash on buttocks.  She continues to have episodes of diarrhea that has resulted in irritant contact dermatitis.  She has used several creams to area to try and find relief.  She believes yeast component has cleared up.  She is currently using vagisil.  She has desitin but has not been using this consistently.  She denies drainage from area, fever or chills.  She has appt with GI next week regarding diarrhea.   ROS:  A comprehensive ROS was completed and negative except as noted per HPI  Allergies  Allergen Reactions  . Codeine Nausea Only    REACTION: nausea  . Sulfamethoxazole Rash    REACTION: rash    Past Medical History:  Diagnosis Date  . Anemia    on iron now  . Hypertension   . Hypothyroidism   . Osteoporosis   . PONV (postoperative nausea and vomiting)   . Thyroid disease   . Varicose vein     Past Surgical History:  Procedure Laterality Date  . ABDOMINAL HYSTERECTOMY  1975   partial  . APPENDECTOMY  1975   with hysterectomy  . HEMORROIDECTOMY     [phone]  . LAPAROSCOPIC SMALL BOWEL RESECTION N/A 11/29/2014   Procedure: LAPAROSCOPIC SMALL INTESTINE RESECTION;  Surgeon: Michael Boston, MD;  Location: [facility];  Service: General;  Laterality: N/A;  . VARICOSE VEIN SURGERY Left 1971    Social History   Socioeconomic History  . Marital status: Widowed    Spouse name: Not on file  . Number of children: Not on file  . Years of education: Not on file  . Highest education level: Not on file  Occupational History  . Not on file  Social Needs  . Financial resource strain: Not on file  . Food insecurity:    Worry: Not on file    Inability: Not on file  . Transportation needs:    Medical: Not on file   Non-medical: Not on file  Tobacco Use  . Smoking status: Never Smoker  . Smokeless tobacco: Never Used  Substance and Sexual Activity  . Alcohol use: No    Alcohol/week: 0.0 standard drinks  . Drug use: No  . Sexual activity: Not on file  Lifestyle  . Physical activity:    Days per week: Not on file    Minutes per session: Not on file  . Stress: Not on file  Relationships  . Social connections:    Talks on phone: Not on file    Gets together: Not on file    Attends religious service: Not on file    Active member of club or organization: Not on file    Attends meetings of clubs or organizations: Not on file    Relationship status: Not on file  Other Topics Concern  . Not on file  Social History Narrative  . Not on file    Family History  Problem Relation Age of Onset  . Bone cancer Mother   . Diabetes Father   . Diabetes Other   . Cancer Other        bone, breast  . Stroke Other   . Breast cancer Sister     Health Maintenance  Topic Date Due  . TETANUS/TDAP  05/15/2018  . INFLUENZA VACCINE  Completed  . DEXA SCAN  Completed  . PNA vac Low Risk Adult  Completed    ----------------------------------------------------------------------------------------------------------------------------------------------------------------------------------------------------------------- Physical Exam There were no vitals taken for this visit.  Physical Exam Exam conducted with a chaperone present (Rebeca Morris).  Constitutional:      Appearance: Normal appearance.  Skin:    Comments: Erythematous skin without ulceration to inner buttock and perineal area.   Neurological:     General: No focal deficit present.     Mental Status: She is alert.  Psychiatric:        Mood and Affect: Mood normal.        Behavior: Behavior normal.      ------------------------------------------------------------------------------------------------------------------------------------------------------------------------------------------------------------------- Assessment and Plan  Skin irritation -Slightly worsened compared to last check -Recommend using barrier cream (desitin) regularly throughout the day -Gently cleanse with mild soap and soft cloth, pat dry

## 2018-07-28 NOTE — Patient Instructions (Signed)
Complete the following steps a few times per day and after each bowel movement:  Gentle cleansing of the genital, perianal, and groin area using mild or soap-free cleansers.  Vigorous washing and rubbing of skin should also be avoided.   ? Drying the skin carefully after washing using a soft cloth without, pat dry without rubbing.  ? Hydrating the skin by applying moisturizers or barrier creams (desitin). Creams are generally preferred to ointments and should be gently applied (without rubbing) on the affected areas in a thin layer.

## 2018-07-28 NOTE — Assessment & Plan Note (Signed)
-  Slightly worsened compared to last check -Recommend using barrier cream (desitin) regularly throughout the day -Gently cleanse with mild soap and soft cloth, pat dry

## 2018-08-01 ENCOUNTER — Ambulatory Visit: Payer: Medicare Other | Admitting: Nurse Practitioner

## 2018-08-01 ENCOUNTER — Encounter: Payer: Self-pay | Admitting: Nurse Practitioner

## 2018-08-01 VITALS — BP 124/82 | HR 87 | Ht 67.0 in | Wt 144.8 lb

## 2018-08-01 DIAGNOSIS — R195 Other fecal abnormalities: Secondary | ICD-10-CM

## 2018-08-01 NOTE — Progress Notes (Signed)
Chief Complaint:    Diarrhea   IMPRESSION and PLAN:    83 yo female with chronic intermittent loose stool, at least since ileal resection in 2016. Difficult to know if  / now much loose stool she is having. Patient doesn't feel frequency has increased significantly(daughter thinks it has) but I think some recent explosive episodes irritated skin as that seems to be patient's focus. Overall it does sound like she is having loose stools at least 3-4 times a week.  - would like to try her on a very conservative dose of imodium to avoid constipation she not all BMs are loose. Trial of 2 mg tablet on Monday, Wed and Friday mornings. Keep log of BMs (frequency, consistency) over next several weeks. If having loose stools more than 3 times a week on imodium then she will call office for further evaluation.  -She has a small skin tag and on anoscopy has small internal hemorrhoids. I don't think these are causing her any issues such as incontinence which could irritate surround skin. Gven the recent problems with perineal / perianal yeast infection I would like to avoid topical steroids for now. Certainly if she has bleeding of fecal incontinence we can treat the hemorrhoids  -Keep perianal / perineal area clean and dry    HPI:     Patient is an 83 yo female known to Dr. Ardis Hughs.  He saw her in 2016 for evaluation of a IDA. EGD and colonoscopy were unrevealing. CT scan showed focal circumferential mass-like lesion in distal small bowel with associated lymphadenopathy concerning for cancer.   Dr. Johney Maine resected the lesion in 2015.  Pathology c/w "ulcer" and nodes were all negative for cancer. There was a microscopic focus of pyloric metaplasia, sometimes associated with Crohn's; however, there were no granulomas present or other features of Crohn's. IBD felt unlikely and we didn't feel further workup was warranted. Her hgb improved with iron replacement and she was eventually able to get off the iron  and maintain hgb in low 12 range.    Ms Gwyndolyn Saxon is here with her daughter who helps provide history . Patient gives a history of chronic diarrhea, patient believes it started after bowel surgery but daughter possibly even prior. BMs vary between normal and formed to loose. History is a little unclear though it does sound like she is having more loose stools lately. Stools do not contain blood.  She takes Metamucil on a daily basis. No recent medication changes. No recent antibiotics. She has no associated weight loss or abdominal pain.  No fevers, nausea or vomiting.  Recently had problems with perianal/perineal burning, redness and itching.  She thinks a couple of episodes of explosive diarrhea burned her skin. Saw PCP and was treated for fungal infection with topical antifungal as well as Diflucan.  Feels like symptoms have significantly improved.  Now she is using Desitin cream. Wears nylon underwear.   Review of systems:     No chest pain, no SOB, no fevers, no urinary sx   Past Medical History:  Diagnosis Date  . Anemia    on iron now  . Hypertension   . Hypothyroidism   . Osteoporosis   . PONV (postoperative nausea and vomiting)   . Thyroid disease   . Varicose vein     Patient's surgical history, family medical history, social history, medications and allergies were all reviewed in Epic   Creatinine clearance cannot be calculated (Patient's most recent lab result  is older than the maximum 21 days allowed.)  Current Outpatient Medications  Medication Sig Dispense Refill  . amLODipine (NORVASC) 5 MG tablet Take 1 tablet (5 mg total) by mouth daily. 90 tablet 1  . CALCIUM-VITAMIN D PO Take 1 tablet by mouth daily after lunch.    . cycloSPORINE (RESTASIS) 0.05 % ophthalmic emulsion 1 drop 2 (two) times daily.    Marland Kitchen levothyroxine (SYNTHROID, LEVOTHROID) 50 MCG tablet TAKE ONE TABLET BY MOUTH EVERY MORNING 90 tablet 4  . lisinopril (PRINIVIL,ZESTRIL) 20 MG tablet Take 1 tablet (20 mg  total) by mouth daily. 90 tablet 3  . Multiple Vitamin (MULTIVITAMIN) tablet Take 1 tablet by mouth daily with lunch.     . polyvinyl alcohol (LIQUIFILM TEARS) 1.4 % ophthalmic solution Place 1-2 drops into both eyes daily as needed for dry eyes.    . traMADol (ULTRAM) 50 MG tablet Take 1 tablet (50 mg total) by mouth every 8 (eight) hours as needed. 30 tablet 0   No current facility-administered medications for this visit.     Physical Exam:     BP 124/82   Pulse 87   Ht 5\' 7"  (1.702 m)   Wt 144 lb 12.8 oz (65.7 kg)   BMI 22.68 kg/m   GENERAL:  Pleasant female in NAD PSYCH: : Cooperative, normal affect CARDIAC:  RRR, no murmur heard, no peripheral edema PULM: Normal respiratory effort, lungs CTA bilaterally, no wheezing ABDOMEN:  Nondistended, soft, nontender. No obvious masses, no hepatomegaly,  normal bowel sounds RECTAL: Mild perianal /  perineal erythma.  Small perianal skin tag. On anoscopy there were small internal hemorrhoids Musculoskeletal:  Normal muscle tone, normal strength NEURO: Alert and oriented x 3, no focal neurologic deficits   Tye Savoy , NP 08/01/2018, 9:42 AM

## 2018-08-01 NOTE — Patient Instructions (Signed)
If you are age 81 or older, your body mass index should be between 23-30. Your Body mass index is 22.68 kg/m. If this is out of the aforementioned range listed, please consider follow up with your Primary Care Provider.  If you are age 46 or younger, your body mass index should be between 19-25. Your Body mass index is 22.68 kg/m. If this is out of the aformentioned range listed, please consider follow up with your Primary Care Provider.   Start Imodium 2 mg one tablet on Monday, Wednesday and Friday.  Keep a log of bowel movements daily and call if still having loose stool on regular basis (more than 3-4 times a week)  Thank you for choosing me and [facility].   Tye Savoy, NP

## 2018-08-01 NOTE — Progress Notes (Signed)
I agree with the above note, plan 

## 2018-08-31 ENCOUNTER — Other Ambulatory Visit: Payer: Self-pay | Admitting: Family Medicine

## 2018-08-31 ENCOUNTER — Ambulatory Visit: Payer: Medicare Other | Admitting: Family Medicine

## 2018-08-31 ENCOUNTER — Encounter: Payer: Self-pay | Admitting: Family Medicine

## 2018-08-31 DIAGNOSIS — G8929 Other chronic pain: Secondary | ICD-10-CM

## 2018-08-31 DIAGNOSIS — R0789 Other chest pain: Secondary | ICD-10-CM

## 2018-08-31 MED ORDER — DICLOFENAC SODIUM 75 MG PO TBEC: 75.0000 mg | DELAYED_RELEASE_TABLET | Freq: Two times a day (BID) | ORAL | 0 refills | Status: DC

## 2018-08-31 MED ORDER — BACLOFEN 10 MG PO TABS: 10.0000 mg | ORAL_TABLET | Freq: Three times a day (TID) | ORAL | 0 refills | Status: DC | PRN

## 2018-08-31 NOTE — Patient Instructions (Signed)
Take diclofenac twice per day as needed for pain Use baclofen up to three times per day as needed.  You may take tramadol if needed as well.   Continue icing or heat as needed for comfort.   Let me know if not improving.

## 2018-08-31 NOTE — Progress Notes (Signed)
Christy Wade - 83 y.o. female MRN [id number]  Date of birth: 12-07-1935  Subjective Chief Complaint  Patient presents with  . Back Pain    located upper mid back-worseneing over the past 3 days-has been applying heat    HPI Christy Wade is a 83 y.o. female with history of chronic chest wall pain here today with flare of acute pain.  She reports that current symptoms started about 3 days ago.  Pain is sharp and located in upper back just below shoulder blades.  She denies numbness, tingling, rash or fever.  She has tried ibuprofen with some relief. Has tramadol but has been hesitant to use this.    ROS:  A comprehensive ROS was completed and negative except as noted per HPI  Allergies  Allergen Reactions  . Codeine Nausea Only    REACTION: nausea  . Sulfamethoxazole Rash    REACTION: rash    Past Medical History:  Diagnosis Date  . Anemia    on iron now  . Hypertension   . Hypothyroidism   . Osteoporosis   . PONV (postoperative nausea and vomiting)   . Thyroid disease   . Varicose vein     Past Surgical History:  Procedure Laterality Date  . ABDOMINAL HYSTERECTOMY  1975   partial  . APPENDECTOMY  1975   with hysterectomy  . HEMORROIDECTOMY     [phone]  . LAPAROSCOPIC SMALL BOWEL RESECTION N/A 11/29/2014   Procedure: LAPAROSCOPIC SMALL INTESTINE RESECTION;  Surgeon: Michael Boston, MD;  Location: [facility];  Service: General;  Laterality: N/A;  . VARICOSE VEIN SURGERY Left 1971    Social History   Socioeconomic History  . Marital status: Widowed    Spouse name: Not on file  . Number of children: Not on file  . Years of education: Not on file  . Highest education level: Not on file  Occupational History  . Not on file  Social Needs  . Financial resource strain: Not on file  . Food insecurity:    Worry: Not on file    Inability: Not on file  . Transportation needs:    Medical: Not on file    Non-medical: Not on file  Tobacco Use  . Smoking  status: Never Smoker  . Smokeless tobacco: Never Used  Substance and Sexual Activity  . Alcohol use: No    Alcohol/week: 0.0 standard drinks  . Drug use: No  . Sexual activity: Not Currently  Lifestyle  . Physical activity:    Days per week: Not on file    Minutes per session: Not on file  . Stress: Not on file  Relationships  . Social connections:    Talks on phone: Not on file    Gets together: Not on file    Attends religious service: Not on file    Active member of club or organization: Not on file    Attends meetings of clubs or organizations: Not on file    Relationship status: Not on file  Other Topics Concern  . Not on file  Social History Narrative  . Not on file    Family History  Problem Relation Age of Onset  . Bone cancer Mother   . Diabetes Father   . Stroke Father   . Breast cancer Sister   . Stomach cancer Neg Hx   . Colon cancer Neg Hx     Health Maintenance  Topic Date Due  . TETANUS/TDAP  05/15/2018  . INFLUENZA VACCINE  Completed  . DEXA SCAN  Completed  . PNA vac Low Risk Adult  Completed    ----------------------------------------------------------------------------------------------------------------------------------------------------------------------------------------------------------------- Physical Exam BP (!) 148/82   Pulse 97   Temp 98.2 F (36.8 C) (Oral)   Ht 5\' 7"  (1.702 m)   Wt 144 lb (65.3 kg)   SpO2 97%   BMI 22.55 kg/m   Physical Exam Constitutional:      Appearance: Normal appearance.  HENT:     Head: Normocephalic and atraumatic.  Cardiovascular:     Rate and Rhythm: Normal rate and regular rhythm.  Pulmonary:     Effort: Pulmonary effort is normal.     Breath sounds: Normal breath sounds.  Musculoskeletal:     Comments: TTP along angle of rib just below scapula on L.    Skin:    General: Skin is warm and dry.     Findings: No rash.  Neurological:     General: No focal deficit present.     Mental Status:  She is alert.  Psychiatric:        Mood and Affect: Mood normal.        Behavior: Behavior normal.     ------------------------------------------------------------------------------------------------------------------------------------------------------------------------------------------------------------------- Assessment and Plan  Chronic chest wall pain Acute on chronic pain Add baclofen and diclofenac Continue heating pad/icing as needed for comfort.  May use tramadol as needed as well.  Call or follow up if symptoms continue to worsen or are not improving with medication.

## 2018-08-31 NOTE — Assessment & Plan Note (Addendum)
Acute on chronic pain Add baclofen and diclofenac Continue heating pad/icing as needed for comfort.  May use tramadol as needed as well.  Call or follow up if symptoms continue to worsen or are not improving with medication.

## 2018-11-02 ENCOUNTER — Encounter: Payer: Medicare Other | Admitting: Family Medicine

## 2018-11-02 ENCOUNTER — Ambulatory Visit (INDEPENDENT_AMBULATORY_CARE_PROVIDER_SITE_OTHER): Payer: Medicare Other | Admitting: Family Medicine

## 2018-11-02 ENCOUNTER — Encounter: Payer: Self-pay | Admitting: Family Medicine

## 2018-11-02 VITALS — BP 129/74 | HR 75 | Temp 97.9°F | Ht 67.0 in | Wt 145.0 lb

## 2018-11-02 DIAGNOSIS — G8929 Other chronic pain: Secondary | ICD-10-CM

## 2018-11-02 DIAGNOSIS — E039 Hypothyroidism, unspecified: Secondary | ICD-10-CM | POA: Diagnosis not present

## 2018-11-02 DIAGNOSIS — Z13220 Encounter for screening for lipoid disorders: Secondary | ICD-10-CM | POA: Diagnosis not present

## 2018-11-02 DIAGNOSIS — R0789 Other chest pain: Secondary | ICD-10-CM

## 2018-11-02 DIAGNOSIS — I10 Essential (primary) hypertension: Secondary | ICD-10-CM | POA: Diagnosis not present

## 2018-11-02 MED ORDER — LISINOPRIL 20 MG PO TABS: ORAL_TABLET | ORAL | 2 refills | Status: DC

## 2018-11-02 MED ORDER — AMLODIPINE BESYLATE 5 MG PO TABS: 5.0000 mg | ORAL_TABLET | Freq: Every day | ORAL | 2 refills | Status: DC

## 2018-11-02 NOTE — Assessment & Plan Note (Addendum)
-  BP is stable -She'll continue to monitor at home.  -Continue current medications -Lab orders entered including lipid screening.

## 2018-11-02 NOTE — Progress Notes (Signed)
Christy Wade - 83 y.o. female MRN [id number]  Date of birth: [birth year]   This visit type was conducted due to national recommendations for restrictions regarding the COVID-19 Pandemic (e.g. social distancing).  This format is felt to be most appropriate for this patient at this time.  All issues noted in this document were discussed and addressed.  No physical exam was performed (except for noted visual exam findings with Video Visits).  I discussed the limitations of evaluation and management by telemedicine and the availability of in person appointments. The patient expressed understanding and agreed to proceed.  I connected with@ on 11/02/18 at  9:00 AM EDT by a video enabled telemedicine application and verified that I am speaking with the correct person using two identifiers.  Interactive audio and video telecommunications were attempted between this provider and patient, however failed, due to patient having technical difficulties OR patient did not have access to video capability.  We continued and completed visit with audio only.     Patient Location: Home [place] [facility] [place] [address]   Provider location:   Home office  Chief Complaint  Patient presents with  . Follow-up    HTN, meds working well , takes b.p. reading 3X QD    HPI  Christy Wade is a 83 y.o. female who presents via audio/video conferencing for a telehealth visit today.  She is following up today for chronic medical problems including HTN and hypothyroidism.   -HTN: Current treatment with amlodipine 5mg  and lisinopril 20mg  daily.  She reports she is doing well with current medications.  She denies side effects including symptoms of hypotension.  She has not had any headache, vision change, palpitations or shortness of breath.   -Hypothyroidism:  Has been on stable dose of levothyroxine for a couple of years.  She denies symptoms of hypo/hyper-thyroidism.    -Chest wall pain:  Chronic condition.  This  has been stable, has tramadol but rarely uses.  Has not needed in >1 month.  She can typically control with tylenol.     ROS:  A comprehensive ROS was completed and negative except as noted per HPI  Past Medical History:  Diagnosis Date  . Anemia    on iron now  . Hypertension   . Hypothyroidism   . Osteoporosis   . PONV (postoperative nausea and vomiting)   . Thyroid disease   . Varicose vein     Past Surgical History:  Procedure Laterality Date  . ABDOMINAL HYSTERECTOMY  1975   partial  . APPENDECTOMY  1975   with hysterectomy  . HEMORROIDECTOMY     [phone]  . LAPAROSCOPIC SMALL BOWEL RESECTION N/A 11/29/2014   Procedure: LAPAROSCOPIC SMALL INTESTINE RESECTION;  Surgeon: Michael Boston, MD;  Location: [facility];  Service: General;  Laterality: N/A;  . VARICOSE VEIN SURGERY Left 1971    Family History  Problem Relation Age of Onset  . Bone cancer Mother   . Diabetes Father   . Stroke Father   . Breast cancer Sister   . Stomach cancer Neg Hx   . Colon cancer Neg Hx     Social History   Socioeconomic History  . Marital status: Widowed    Spouse name: Not on file  . Number of children: Not on file  . Years of education: Not on file  . Highest education level: Not on file  Occupational History  . Not on file  Social Needs  . Financial resource strain: Not on  file  . Food insecurity:    Worry: Not on file    Inability: Not on file  . Transportation needs:    Medical: Not on file    Non-medical: Not on file  Tobacco Use  . Smoking status: Never Smoker  . Smokeless tobacco: Never Used  Substance and Sexual Activity  . Alcohol use: No    Alcohol/week: 0.0 standard drinks  . Drug use: No  . Sexual activity: Not Currently  Lifestyle  . Physical activity:    Days per week: Not on file    Minutes per session: Not on file  . Stress: Not on file  Relationships  . Social connections:    Talks on phone: Not on file    Gets together: Not on file    Attends  religious service: Not on file    Active member of club or organization: Not on file    Attends meetings of clubs or organizations: Not on file    Relationship status: Not on file  . Intimate partner violence:    Fear of current or ex partner: Not on file    Emotionally abused: Not on file    Physically abused: Not on file    Forced sexual activity: Not on file  Other Topics Concern  . Not on file  Social History Narrative  . Not on file     Current Outpatient Medications:  .  amLODipine (NORVASC) 5 MG tablet, Take 1 tablet (5 mg total) by mouth daily., Disp: 90 tablet, Rfl: 2 .  CALCIUM-VITAMIN D PO, Take 1 tablet by mouth daily after lunch., Disp: , Rfl:  .  cycloSPORINE (RESTASIS) 0.05 % ophthalmic emulsion, 1 drop 2 (two) times daily., Disp: , Rfl:  .  levothyroxine (SYNTHROID, LEVOTHROID) 50 MCG tablet, TAKE ONE TABLET BY MOUTH EVERY MORNING, Disp: 90 tablet, Rfl: 4 .  lisinopril (PRINIVIL,ZESTRIL) 20 MG tablet, TK 1 T PO D, Disp: 90 tablet, Rfl: 2 .  Multiple Vitamin (MULTIVITAMIN) tablet, Take 1 tablet by mouth daily with lunch. , Disp: , Rfl:  .  polyvinyl alcohol (LIQUIFILM TEARS) 1.4 % ophthalmic solution, Place 1-2 drops into both eyes daily as needed for dry eyes., Disp: , Rfl:  .  traMADol (ULTRAM) 50 MG tablet, Take 1 tablet (50 mg total) by mouth every 8 (eight) hours as needed., Disp: 30 tablet, Rfl: 0  EXAM:  VITALS per patient if applicable: BP 161/09 Comment: pt take bp daily @hm   Pulse 75   Temp 97.9 F (36.6 C) (Oral) Comment: pt reports from hm.  Ht 5\' 7"  (1.702 m)   Wt 145 lb (65.8 kg) Comment: pt reports from hm  BMI 22.71 kg/m   GENERAL: alert, oriented  PSYCH/NEURO: pleasant and cooperative, no obvious depression or anxiety, speech and thought processing grossly intact  ASSESSMENT AND PLAN:  Discussed the following assessment and plan:  Hypothyroidism -Stable, denies symptoms at this time.  -She will stop in to have updated labs completed in  the next couple of weeks.   Essential hypertension -BP is stable -She'll continue to monitor at home.  -Continue current medications -Lab orders entered including lipid screening.   Chronic chest wall pain -Stable.  She may continue tylenol as needed with tramadol for times that she has severe pain.         I discussed the assessment and treatment plan with the patient. The patient was provided an opportunity to ask questions and all were answered. The patient agreed with the  plan and demonstrated an understanding of the instructions.   The patient was advised to call back or seek an in-person evaluation if the symptoms worsen or if the condition fails to improve as anticipated.  I provided 20 minutes of non-face-to-face time during this encounter.   Luetta Nutting, DO

## 2018-11-02 NOTE — Assessment & Plan Note (Signed)
-  Stable, denies symptoms at this time.  -She will stop in to have updated labs completed in the next couple of weeks.

## 2018-11-02 NOTE — Assessment & Plan Note (Signed)
-  Stable.  She may continue tylenol as needed with tramadol for times that she has severe pain.

## 2018-11-14 ENCOUNTER — Other Ambulatory Visit: Payer: Self-pay | Admitting: Family Medicine

## 2018-11-14 NOTE — Telephone Encounter (Signed)
Pt requested Rx refill. Sent to Dr. Zigmund Daniel for review.

## 2018-11-15 ENCOUNTER — Other Ambulatory Visit: Payer: Self-pay | Admitting: Family Medicine

## 2019-01-12 ENCOUNTER — Telehealth: Payer: Self-pay

## 2019-01-12 NOTE — Telephone Encounter (Signed)
Questions for Screening COVID-19  Symptom onset: none  Travel or Contacts:none  During this illness, did/does the patient experience any of the following symptoms? Fever >100.21F []   Yes [x]   No []   Unknown Subjective fever (felt feverish) []   Yes [x]   No []   Unknown Chills []   Yes [x]   No []   Unknown Muscle aches (myalgia) []   Yes [x]   No []   Unknown Runny nose (rhinorrhea) []   Yes [x]   No []   Unknown Sore throat []   Yes [x]   No []   Unknown Cough (new onset or worsening of chronic cough) []   Yes [x]   No []   Unknown Shortness of breath (dyspnea) []   Yes [x]   No []   Unknown Nausea or vomiting []   Yes [x]   No []   Unknown Headache []   Yes [x]   No []   Unknown Abdominal pain  []   Yes [x]   No []   Unknown Diarrhea (?3 loose/looser than normal stools/24hr period) []   Yes [x]   No []   Unknown Other, specify:  Patient risk factors: Smoker? [x]   Current []   Former []   Never If female, currently pregnant? []   Yes [x]   No  Patient Active Problem List   Diagnosis Date Noted  . Skin irritation 07/24/2018  . Diarrhea 07/24/2018  . Vaginitis and vulvovaginitis 05/22/2016  . Routine general medical examination at a health care facility 10/01/2015  . Chronic chest wall pain 09/09/2014  . Mitral valve regurgitation 06/21/2012  . Essential hypertension 01/03/2008  . Osteopenia 03/28/2007  . Hypothyroidism 02/16/2007    Plan:  []   High risk for COVID-19 with red flags go to ED (with CP, SOB, weak/lightheaded, or fever > 101.5). Call ahead.  []   High risk for COVID-19 but stable. Inform provider and coordinate time for [facility] visit.   []   No red flags but URI signs or symptoms okay for [facility] visit.

## 2019-01-15 ENCOUNTER — Encounter: Payer: Self-pay | Admitting: Family Medicine

## 2019-01-15 ENCOUNTER — Ambulatory Visit (INDEPENDENT_AMBULATORY_CARE_PROVIDER_SITE_OTHER): Payer: Medicare Other | Admitting: Family Medicine

## 2019-01-15 VITALS — BP 136/76 | HR 78 | Temp 98.4°F | Resp 18 | Ht 67.0 in | Wt 138.6 lb

## 2019-01-15 DIAGNOSIS — Z13220 Encounter for screening for lipoid disorders: Secondary | ICD-10-CM | POA: Diagnosis not present

## 2019-01-15 DIAGNOSIS — M8580 Other specified disorders of bone density and structure, unspecified site: Secondary | ICD-10-CM

## 2019-01-15 DIAGNOSIS — I10 Essential (primary) hypertension: Secondary | ICD-10-CM | POA: Diagnosis not present

## 2019-01-15 DIAGNOSIS — E039 Hypothyroidism, unspecified: Secondary | ICD-10-CM | POA: Diagnosis not present

## 2019-01-15 DIAGNOSIS — Z0000 Encounter for general adult medical examination without abnormal findings: Secondary | ICD-10-CM

## 2019-01-15 LAB — LIPID PANEL
Cholesterol: 152 mg/dL (ref 0–200)
HDL: 78.9 mg/dL (ref >39.00)
LDL Cholesterol: 59 mg/dL (ref 0–99)
NonHDL: 73.51
Total CHOL/HDL Ratio: 2
Triglycerides: 71 mg/dL (ref 0.0–149.0)
VLDL: 14.2 mg/dL (ref 0.0–40.0)

## 2019-01-15 LAB — COMPREHENSIVE METABOLIC PANEL
ALT: 11 U/L (ref 0–35)
AST: 17 U/L (ref 0–37)
Albumin: 4 g/dL (ref 3.5–5.2)
Alkaline Phosphatase: 60 U/L (ref 39–117)
BUN: 14 mg/dL (ref 6–23)
CO2: 22 mEq/L (ref 19–32)
Calcium: 9.2 mg/dL (ref 8.4–10.5)
Chloride: 97 mEq/L (ref 96–112)
Creatinine, Ser: 1.15 mg/dL (ref 0.40–1.20)
GFR: 45.34 mL/min — ABNORMAL LOW (ref >60.00)
Glucose, Bld: 93 mg/dL (ref 70–99)
Potassium: 5 mEq/L (ref 3.5–5.1)
Sodium: 127 mEq/L — ABNORMAL LOW (ref 135–145)
Total Bilirubin: 0.6 mg/dL (ref 0.2–1.2)
Total Protein: 7.8 g/dL (ref 6.0–8.3)

## 2019-01-15 LAB — CBC
HCT: 31 % — ABNORMAL LOW (ref 36.0–46.0)
Hemoglobin: 10.2 g/dL — ABNORMAL LOW (ref 12.0–15.0)
MCHC: 32.8 g/dL (ref 30.0–36.0)
MCV: 83 fl (ref 78.0–100.0)
Platelets: 229 10*3/uL (ref 150.0–400.0)
RBC: 3.73 Mil/uL — ABNORMAL LOW (ref 3.87–5.11)
RDW: 14.7 % (ref 11.5–15.5)
WBC: 4.6 10*3/uL (ref 4.0–10.5)

## 2019-01-15 LAB — TSH: TSH: 4.3 u[IU]/mL (ref 0.35–4.50)

## 2019-01-15 LAB — VITAMIN D 25 HYDROXY (VIT D DEFICIENCY, FRACTURES): VITD: 53.9 ng/mL (ref 30.00–100.00)

## 2019-01-15 NOTE — Assessment & Plan Note (Signed)
-  Check vitamin d levels, continue calcium +d supplement

## 2019-01-15 NOTE — Assessment & Plan Note (Signed)
Well adult Screenings: Lipid profile. Immunizations: up to date Anticipatory guidance/Risk factor reduction:  Recommendations per AVS F/u 6 months for hypothyroid and HTN

## 2019-01-15 NOTE — Patient Instructions (Signed)
Preventive Care 81 Years and Older, Female Preventive care refers to lifestyle choices and visits with your health care provider that can promote health and wellness. This includes:  A yearly physical exam. This is also called an annual well check.  Regular dental and eye exams.  Immunizations.  Screening for certain conditions.  Healthy lifestyle choices, such as diet and exercise. What can I expect for my preventive care visit? Physical exam Your health care provider will check:  Height and weight. These may be used to calculate body mass index (BMI), which is a measurement that tells if you are at a healthy weight.  Heart rate and blood pressure.  Your skin for abnormal spots. Counseling Your health care provider may ask you questions about:  Alcohol, tobacco, and drug use.  Emotional well-being.  Home and relationship well-being.  Sexual activity.  Eating habits.  History of falls.  Memory and ability to understand (cognition).  Work and work Statistician.  Pregnancy and menstrual history. What immunizations do I need?  Influenza (flu) vaccine  This is recommended every year. Tetanus, diphtheria, and pertussis (Tdap) vaccine  You may need a Td booster every 10 years. Varicella (chickenpox) vaccine  You may need this vaccine if you have not already been vaccinated. Zoster (shingles) vaccine  You may need this after age 81. Pneumococcal conjugate (PCV13) vaccine  One dose is recommended after age 81. Pneumococcal polysaccharide (PPSV23) vaccine  One dose is recommended after age 81. Measles, mumps, and rubella (MMR) vaccine  You may need at least one dose of MMR if you were born in 1957 or later. You may also need a second dose. Meningococcal conjugate (MenACWY) vaccine  You may need this if you have certain conditions. Hepatitis A vaccine  You may need this if you have certain conditions or if you travel or work in places where you may be exposed  to hepatitis A. Hepatitis B vaccine  You may need this if you have certain conditions or if you travel or work in places where you may be exposed to hepatitis B. Haemophilus influenzae type b (Hib) vaccine  You may need this if you have certain conditions. You may receive vaccines as individual doses or as more than one vaccine together in one shot (combination vaccines). Talk with your health care provider about the risks and benefits of combination vaccines. What tests do I need? Blood tests  Lipid and cholesterol levels. These may be checked every 5 years, or more frequently depending on your overall health.  Hepatitis C test.  Hepatitis B test. Screening  Lung cancer screening. You may have this screening every year starting at age 81 if you have a 30-pack-year history of smoking and currently smoke or have quit within the past 15 years.  Colorectal cancer screening. All adults should have this screening starting at age 81 and continuing until age 15. Your health care provider may recommend screening at age 81 if you are at increased risk. You will have tests every 1-10 years, depending on your results and the type of screening test.  Diabetes screening. This is done by checking your blood sugar (glucose) after you have not eaten for a while (fasting). You may have this done every 1-3 years.  Mammogram. This may be done every 1-2 years. Talk with your health care provider about how often you should have regular mammograms.  BRCA-related cancer screening. This may be done if you have a family history of breast, ovarian, tubal, or peritoneal cancers.  Other tests  Sexually transmitted disease (STD) testing.  Bone density scan. This is done to screen for osteoporosis. You may have this done starting at age 81. Follow these instructions at home: Eating and drinking  Eat a diet that includes fresh fruits and vegetables, whole grains, lean protein, and low-fat dairy products. Limit  your intake of foods with high amounts of sugar, saturated fats, and salt.  Take vitamin and mineral supplements as recommended by your health care provider.  Do not drink alcohol if your health care provider tells you not to drink.  If you drink alcohol: ? Limit how much you have to 0-1 drink a day. ? Be aware of how much alcohol is in your drink. In the [place] one drink equals one 12 oz bottle of beer (355 mL), one 5 oz glass of wine (148 mL), or one 1 oz glass of hard liquor (44 mL). Lifestyle  Take daily care of your teeth and gums.  Stay active. Exercise for at least 30 minutes on 5 or more days each week.  Do not use any products that contain nicotine or tobacco, such as cigarettes, e-cigarettes, and chewing tobacco. If you need help quitting, ask your health care provider.  If you are sexually active, practice safe sex. Use a condom or other form of protection in order to prevent STIs (sexually transmitted infections).  Talk with your health care provider about taking a low-dose aspirin or statin. What's next?  Go to your health care provider once a year for a well check visit.  Ask your health care provider how often you should have your eyes and teeth checked.  Stay up to date on all vaccines. This information is not intended to replace advice given to you by your health care provider. Make sure you discuss any questions you have with your health care provider. Document Released: 08/01/2015 Document Revised: 06/29/2018 Document Reviewed: 06/29/2018 Elsevier Patient Education  2020 [employer].

## 2019-01-15 NOTE — Progress Notes (Signed)
Christy Wade - 83 y.o. female MRN [id number]  Date of birth: 05/22/1936  Subjective Chief Complaint  Patient presents with  . Annual Exam    Review of chronic condions ? 4 mo F/U     HPI Christy Wade is a 83 y.o. female here today for annual exam.  She reports she is doing well and has no new concerns today.  She is a lifelong non-smoker.  She is up to date on  Recommended vaccines and screenings.  She does walk some for exercise.   Doing well with current medication, denies side effects.     Review of Systems  Constitutional: Negative for chills, fever, malaise/fatigue and weight loss.  HENT: Negative for congestion, ear pain and sore throat.   Eyes: Negative for blurred vision, double vision and pain.  Respiratory: Negative for cough and shortness of breath.   Cardiovascular: Negative for chest pain and palpitations.  Gastrointestinal: Negative for abdominal pain, blood in stool, constipation, heartburn and nausea.  Genitourinary: Negative for dysuria and urgency.  Musculoskeletal: Negative for joint pain and myalgias.  Neurological: Negative for dizziness and headaches.  Endo/Heme/Allergies: Does not bruise/bleed easily.  Psychiatric/Behavioral: Negative for depression. The patient is not nervous/anxious and does not have insomnia.     Allergies  Allergen Reactions  . Codeine Nausea Only    REACTION: nausea  . Sulfamethoxazole Rash    REACTION: rash    Past Medical History:  Diagnosis Date  . Anemia    on iron now  . Hypertension   . Hypothyroidism   . Osteoporosis   . PONV (postoperative nausea and vomiting)   . Thyroid disease   . Varicose vein     Past Surgical History:  Procedure Laterality Date  . ABDOMINAL HYSTERECTOMY  1975   partial  . APPENDECTOMY  1975   with hysterectomy  . HEMORROIDECTOMY     [phone]  . LAPAROSCOPIC SMALL BOWEL RESECTION N/A 11/29/2014   Procedure: LAPAROSCOPIC SMALL INTESTINE RESECTION;  Surgeon: Michael Boston, MD;   Location: [facility];  Service: General;  Laterality: N/A;  . VARICOSE VEIN SURGERY Left 1971    Social History   Socioeconomic History  . Marital status: Widowed    Spouse name: Not on file  . Number of children: Not on file  . Years of education: Not on file  . Highest education level: Not on file  Occupational History  . Not on file  Social Needs  . Financial resource strain: Not on file  . Food insecurity    Worry: Not on file    Inability: Not on file  . Transportation needs    Medical: Not on file    Non-medical: Not on file  Tobacco Use  . Smoking status: Never Smoker  . Smokeless tobacco: Never Used  Substance and Sexual Activity  . Alcohol use: No    Alcohol/week: 0.0 standard drinks  . Drug use: No  . Sexual activity: Not Currently  Lifestyle  . Physical activity    Days per week: Not on file    Minutes per session: Not on file  . Stress: Not on file  Relationships  . Social Herbalist on phone: Not on file    Gets together: Not on file    Attends religious service: Not on file    Active member of club or organization: Not on file    Attends meetings of clubs or organizations: Not on file    Relationship status: Not  on file  Other Topics Concern  . Not on file  Social History Narrative  . Not on file    Family History  Problem Relation Age of Onset  . Bone cancer Mother   . Diabetes Father   . Stroke Father   . Breast cancer Sister   . Stomach cancer Neg Hx   . Colon cancer Neg Hx     Health Maintenance  Topic Date Due  . INFLUENZA VACCINE  02/17/2019  . TETANUS/TDAP  05/30/2028  . DEXA SCAN  Completed  . PNA vac Low Risk Adult  Completed    ----------------------------------------------------------------------------------------------------------------------------------------------------------------------------------------------------------------- Physical Exam BP 136/76   Pulse 78   Temp 98.4 F (36.9 C) (Oral)   Resp 18   Ht  5\' 7"  (1.702 m)   Wt 138 lb 9.6 oz (62.9 kg)   SpO2 98%   BMI 21.71 kg/m   Physical Exam Constitutional:      General: She is not in acute distress. HENT:     Head: Normocephalic and atraumatic.     Nose: Nose normal.  Eyes:     General: No scleral icterus.    Conjunctiva/sclera: Conjunctivae normal.  Neck:     Musculoskeletal: Normal range of motion and neck supple.     Thyroid: No thyromegaly.  Cardiovascular:     Rate and Rhythm: Normal rate and regular rhythm.     Heart sounds: Normal heart sounds.     Comments: Varicosities on bilateral lower extremities.  Pulmonary:     Effort: Pulmonary effort is normal.     Breath sounds: Normal breath sounds.  Abdominal:     General: Bowel sounds are normal. There is no distension.     Palpations: Abdomen is soft.     Tenderness: There is no abdominal tenderness. There is no guarding.  Musculoskeletal: Normal range of motion.  Lymphadenopathy:     Cervical: No cervical adenopathy.  Skin:    General: Skin is warm and dry.     Findings: No rash.  Neurological:     Mental Status: She is alert and oriented to person, place, and time.     Cranial Nerves: No cranial nerve deficit.     Coordination: Coordination normal.  Psychiatric:        Behavior: Behavior normal.     ------------------------------------------------------------------------------------------------------------------------------------------------------------------------------------------------------------------- Assessment and Plan  Osteopenia -Check vitamin d levels, continue calcium +d supplement  Hypothyroidism Update TSH  Essential hypertension Stable, BP well controlled.   Well adult exam Well adult Screenings: Lipid profile. Immunizations: up to date Anticipatory guidance/Risk factor reduction:  Recommendations per AVS F/u 6 months for hypothyroid and HTN

## 2019-01-15 NOTE — Assessment & Plan Note (Signed)
Stable, BP well controlled.

## 2019-01-15 NOTE — Assessment & Plan Note (Signed)
Update TSH

## 2019-01-18 ENCOUNTER — Encounter: Payer: Medicare Other | Admitting: Family Medicine

## 2019-01-18 ENCOUNTER — Other Ambulatory Visit: Payer: Self-pay | Admitting: Family Medicine

## 2019-01-18 DIAGNOSIS — E871 Hypo-osmolality and hyponatremia: Secondary | ICD-10-CM

## 2019-01-18 DIAGNOSIS — D649 Anemia, unspecified: Secondary | ICD-10-CM

## 2019-01-18 NOTE — Progress Notes (Signed)
Sodium levels are low also with some anemia compared to last year.  I would like to get some additional lab work for further investigation of her anemia and low sodium.  Future orders entered, please schedule for lab visit.

## 2019-01-22 ENCOUNTER — Other Ambulatory Visit (INDEPENDENT_AMBULATORY_CARE_PROVIDER_SITE_OTHER): Payer: Medicare Other

## 2019-01-22 DIAGNOSIS — D649 Anemia, unspecified: Secondary | ICD-10-CM | POA: Diagnosis not present

## 2019-01-22 DIAGNOSIS — E871 Hypo-osmolality and hyponatremia: Secondary | ICD-10-CM

## 2019-01-22 LAB — BASIC METABOLIC PANEL
BUN: 23 mg/dL (ref 6–23)
CO2: 20 mEq/L (ref 19–32)
Calcium: 9.1 mg/dL (ref 8.4–10.5)
Chloride: 101 mEq/L (ref 96–112)
Creatinine, Ser: 1.25 mg/dL — ABNORMAL HIGH (ref 0.40–1.20)
GFR: 41.18 mL/min — ABNORMAL LOW (ref >60.00)
Glucose, Bld: 88 mg/dL (ref 70–99)
Potassium: 4.9 mEq/L (ref 3.5–5.1)
Sodium: 130 mEq/L — ABNORMAL LOW (ref 135–145)

## 2019-01-22 LAB — B12 AND FOLATE PANEL
Folate: 14.3 ng/mL (ref >5.9)
Vitamin B-12: 235 pg/mL (ref 211–911)

## 2019-01-22 LAB — CBC WITH DIFFERENTIAL/PLATELET
Basophils Absolute: 0 10*3/uL (ref 0.0–0.1)
Basophils Relative: 0.4 % (ref 0.0–3.0)
Eosinophils Absolute: 0 10*3/uL (ref 0.0–0.7)
Eosinophils Relative: 0.4 % (ref 0.0–5.0)
HCT: 29.5 % — ABNORMAL LOW (ref 36.0–46.0)
Hemoglobin: 9.9 g/dL — ABNORMAL LOW (ref 12.0–15.0)
Lymphocytes Relative: 31.1 % (ref 12.0–46.0)
Lymphs Abs: 1.3 10*3/uL (ref 0.7–4.0)
MCHC: 33.5 g/dL (ref 30.0–36.0)
MCV: 82.7 fl (ref 78.0–100.0)
Monocytes Absolute: 0.5 10*3/uL (ref 0.1–1.0)
Monocytes Relative: 11.4 % (ref 3.0–12.0)
Neutro Abs: 2.3 10*3/uL (ref 1.4–7.7)
Neutrophils Relative %: 56.7 % (ref 43.0–77.0)
Platelets: 216 10*3/uL (ref 150.0–400.0)
RBC: 3.57 Mil/uL — ABNORMAL LOW (ref 3.87–5.11)
RDW: 15.3 % (ref 11.5–15.5)
WBC: 4.1 10*3/uL (ref 4.0–10.5)

## 2019-01-22 LAB — FERRITIN: Ferritin: 16.2 ng/mL (ref 10.0–291.0)

## 2019-01-23 LAB — RETICULOCYTES
ABS Retic: 39820 cells/uL (ref 20000–8000)
Retic Ct Pct: 1.1 %

## 2019-01-23 LAB — SODIUM, URINE, RANDOM: Sodium, Ur: 53 mmol/L (ref 28–272)

## 2019-01-25 NOTE — Progress Notes (Signed)
-  Sodium improved some, stable compared to prior readings.  -anemia stable, iron levels are low.  Recommend starting an iron supplement (ferrous sulfate 325mg  daily).  B12 levels are also on the low end of normal.  I would recommend starting a b12 supplement (107mcg daily). -Follow up in 12 weeks to recheck labs.

## 2019-01-28 ENCOUNTER — Encounter: Payer: Self-pay | Admitting: Family Medicine

## 2019-01-29 ENCOUNTER — Other Ambulatory Visit: Payer: Self-pay | Admitting: Family Medicine

## 2019-01-29 DIAGNOSIS — N289 Disorder of kidney and ureter, unspecified: Secondary | ICD-10-CM

## 2019-01-29 NOTE — Telephone Encounter (Signed)
Please contact to schedule for labs to recheck kidney function in 10-14 days.

## 2019-01-29 NOTE — Telephone Encounter (Signed)
Christy Wade, Appt for lab set for 02/12/2019 @8 :30am.

## 2019-02-06 ENCOUNTER — Ambulatory Visit: Payer: Medicare Other | Admitting: Family Medicine

## 2019-02-06 ENCOUNTER — Encounter: Payer: Self-pay | Admitting: Family Medicine

## 2019-02-06 VITALS — BP 128/80 | HR 95 | Ht 67.0 in | Wt 140.0 lb

## 2019-02-06 DIAGNOSIS — B373 Candidiasis of vulva and vagina: Secondary | ICD-10-CM

## 2019-02-06 DIAGNOSIS — B3731 Acute candidiasis of vulva and vagina: Secondary | ICD-10-CM

## 2019-02-06 MED ORDER — TERCONAZOLE 0.8 % VA CREA: 1.0000 | TOPICAL_CREAM | Freq: Every day | VAGINAL | 0 refills | Status: AC

## 2019-02-06 MED ORDER — FLUCONAZOLE 150 MG PO TABS: 150.0000 mg | ORAL_TABLET | Freq: Once | ORAL | 0 refills | Status: AC

## 2019-02-06 NOTE — Progress Notes (Signed)
Established Patient Office Visit  Subjective:  Patient ID: Christy Wade, female    DOB: [birth year]  Age: 83 y.o. MRN: [id number]  CC:  Chief Complaint  Patient presents with  . Rash    HPI Christy Wade presents for evaluation and treatment of a 1 day history of vaginal irritation with swelling and pruritus.  There is been little discharge.  Patient is not a diabetic and has not been on recent antibiotic therapy.  No new recent skin exposures.  Not active sexually.  Past Medical History:  Diagnosis Date  . Anemia    on iron now  . Hypertension   . Hypothyroidism   . Osteoporosis   . PONV (postoperative nausea and vomiting)   . Thyroid disease   . Varicose vein     Past Surgical History:  Procedure Laterality Date  . ABDOMINAL HYSTERECTOMY  1975   partial  . APPENDECTOMY  1975   with hysterectomy  . HEMORROIDECTOMY     [phone]  . LAPAROSCOPIC SMALL BOWEL RESECTION N/A 11/29/2014   Procedure: LAPAROSCOPIC SMALL INTESTINE RESECTION;  Surgeon: Michael Boston, MD;  Location: [facility];  Service: General;  Laterality: N/A;  . VARICOSE VEIN SURGERY Left 1971    Family History  Problem Relation Age of Onset  . Bone cancer Mother   . Diabetes Father   . Stroke Father   . Breast cancer Sister   . Stomach cancer Neg Hx   . Colon cancer Neg Hx     Social History   Socioeconomic History  . Marital status: Widowed    Spouse name: Not on file  . Number of children: Not on file  . Years of education: Not on file  . Highest education level: Not on file  Occupational History  . Not on file  Social Needs  . Financial resource strain: Not on file  . Food insecurity    Worry: Not on file    Inability: Not on file  . Transportation needs    Medical: Not on file    Non-medical: Not on file  Tobacco Use  . Smoking status: Never Smoker  . Smokeless tobacco: Never Used  Substance and Sexual Activity  . Alcohol use: No    Alcohol/week: 0.0 standard drinks  .  Drug use: No  . Sexual activity: Not Currently  Lifestyle  . Physical activity    Days per week: Not on file    Minutes per session: Not on file  . Stress: Not on file  Relationships  . Social Herbalist on phone: Not on file    Gets together: Not on file    Attends religious service: Not on file    Active member of club or organization: Not on file    Attends meetings of clubs or organizations: Not on file    Relationship status: Not on file  . Intimate partner violence    Fear of current or ex partner: Not on file    Emotionally abused: Not on file    Physically abused: Not on file    Forced sexual activity: Not on file  Other Topics Concern  . Not on file  Social History Narrative  . Not on file    Outpatient Medications Prior to Visit  Medication Sig Dispense Refill  . amLODipine (NORVASC) 5 MG tablet TAKE 1 TABLET(5 MG) BY MOUTH DAILY 90 tablet 2  . CALCIUM-VITAMIN D PO Take 1 tablet by mouth daily after lunch.    Marland Kitchen  cycloSPORINE (RESTASIS) 0.05 % ophthalmic emulsion 1 drop 2 (two) times daily.    Marland Kitchen levothyroxine (SYNTHROID, LEVOTHROID) 50 MCG tablet TAKE ONE TABLET BY MOUTH EVERY MORNING 90 tablet 4  . lisinopril (PRINIVIL,ZESTRIL) 20 MG tablet TK 1 T PO D 90 tablet 2  . Multiple Vitamin (MULTIVITAMIN) tablet Take 1 tablet by mouth daily with lunch.     . polyvinyl alcohol (LIQUIFILM TEARS) 1.4 % ophthalmic solution Place 1-2 drops into both eyes daily as needed for dry eyes.    . traMADol (ULTRAM) 50 MG tablet TAKE 1 TABLET(50 MG) BY MOUTH EVERY 8 HOURS AS NEEDED 30 tablet 0   No facility-administered medications prior to visit.     Allergies  Allergen Reactions  . Codeine Nausea Only    REACTION: nausea  . Sulfamethoxazole Rash    REACTION: rash    ROS Review of Systems  Constitutional: Negative.   Respiratory: Negative.   Cardiovascular: Negative.   Gastrointestinal: Negative.   Genitourinary: Positive for vaginal pain. Negative for difficulty  urinating, dysuria, genital sores and vaginal discharge.  Skin: Positive for color change and rash.  Hematological: Does not bruise/bleed easily.  Psychiatric/Behavioral: Negative.       Objective:    Physical Exam  Constitutional: She is oriented to person, place, and time. She appears well-developed and well-nourished. No distress.  HENT:  Head: Normocephalic and atraumatic.  Right Ear: External ear normal.  Left Ear: External ear normal.  Eyes: Right eye exhibits no discharge. Left eye exhibits no discharge. No scleral icterus.  Neck: No JVD present. No tracheal deviation present.  Pulmonary/Chest: Effort normal. No stridor.  Genitourinary:    There is rash on the right labia. There is rash on the left labia.    Vaginal erythema present.     No vaginal discharge.  There is erythema in the vagina.  Neurological: She is alert and oriented to person, place, and time.  Skin: Skin is warm and dry. She is not diaphoretic.  Psychiatric: She has a normal mood and affect. Her behavior is normal.    BP 128/80   Pulse 95   Ht 5\' 7"  (1.702 m)   Wt 140 lb (63.5 kg)   SpO2 98%   BMI 21.93 kg/m  Wt Readings from Last 3 Encounters:  02/06/19 140 lb (63.5 kg)  01/15/19 138 lb 9.6 oz (62.9 kg)  11/02/18 145 lb (65.8 kg)   BP Readings from Last 3 Encounters:  02/06/19 128/80  01/15/19 136/76  11/02/18 129/74   Guideline developer:  UpToDate (see UpToDate for funding source) Date Released: June 2014  There are no preventive care reminders to display for this patient.  There are no preventive care reminders to display for this patient.  Lab Results  Component Value Date   TSH 4.30 01/15/2019   Lab Results  Component Value Date   WBC 4.1 01/22/2019   HGB 9.9 (L) 01/22/2019   HCT 29.5 (L) 01/22/2019   MCV 82.7 01/22/2019   PLT 216.0 01/22/2019   Lab Results  Component Value Date   NA 130 (L) 01/22/2019   K 4.9 01/22/2019   CO2 20 01/22/2019   GLUCOSE 88 01/22/2019    BUN 23 01/22/2019   CREATININE 1.25 (H) 01/22/2019   BILITOT 0.6 01/15/2019   ALKPHOS 60 01/15/2019   AST 17 01/15/2019   ALT 11 01/15/2019   PROT 7.8 01/15/2019   ALBUMIN 4.0 01/15/2019   CALCIUM 9.1 01/22/2019   ANIONGAP 9 11/30/2014   GFR  41.18 (L) 01/22/2019   Lab Results  Component Value Date   CHOL 152 01/15/2019   Lab Results  Component Value Date   HDL 78.90 01/15/2019   Lab Results  Component Value Date   LDLCALC 59 01/15/2019   Lab Results  Component Value Date   TRIG 71.0 01/15/2019   Lab Results  Component Value Date   CHOLHDL 2 01/15/2019   No results found for: HGBA1C    Assessment & Plan:   Problem List Items Addressed This Visit    None    Visit Diagnoses    Yeast vaginitis    -  Primary   Relevant Medications   fluconazole (DIFLUCAN) 150 MG tablet   terconazole (TERAZOL 3) 0.8 % vaginal cream      Meds ordered this encounter  Medications  . fluconazole (DIFLUCAN) 150 MG tablet    Sig: Take 1 tablet (150 mg total) by mouth once for 1 dose.    Dispense:  1 tablet    Refill:  0  . terconazole (TERAZOL 3) 0.8 % vaginal cream    Sig: Place 1 applicator vaginally at bedtime for 3 days.    Dispense:  20 g    Refill:  0    Follow-up: Return Follow up with Dr. Zigmund Daniel in one week if not improving.Marland Kitchen

## 2019-02-09 ENCOUNTER — Telehealth: Payer: Self-pay

## 2019-02-09 NOTE — Telephone Encounter (Signed)
During this illness, did/does the patient experience any of the following symptoms? Fever >100.45F []   Yes [x]   No []   Unknown Subjective fever (felt feverish) []   Yes [x]   No []   Unknown Chills []   Yes [x]   No []   Unknown Muscle aches (myalgia) []   Yes [x]   No []   Unknown Runny nose (rhinorrhea) []   Yes [x]   No []   Unknown Sore throat []   Yes [x]   No []   Unknown Cough (new onset or worsening of chronic cough) []   Yes [x]   No []   Unknown Shortness of breath (dyspnea) []   Yes [x]   No []   Unknown Nausea or vomiting []   Yes [x]   No []   Unknown Headache []   Yes [x]   No []   Unknown Abdominal pain  []   Yes [x]   No []   Unknown Diarrhea (?3 loose/looser than normal stools/24hr period) []   Yes [x]   No []   Unknown Other, specify: ADVISED TO CALL OFFICE IF ANY OF THESE SYMPTOMS APPEAR OVER THE WEEKEND.

## 2019-02-12 ENCOUNTER — Other Ambulatory Visit (INDEPENDENT_AMBULATORY_CARE_PROVIDER_SITE_OTHER): Payer: Medicare Other

## 2019-02-12 ENCOUNTER — Other Ambulatory Visit: Payer: Self-pay

## 2019-02-12 DIAGNOSIS — N289 Disorder of kidney and ureter, unspecified: Secondary | ICD-10-CM

## 2019-02-12 LAB — POCT URINALYSIS DIPSTICK
Bilirubin, UA: NEGATIVE
Blood, UA: NEGATIVE
Glucose, UA: NEGATIVE
Ketones, UA: NEGATIVE
Nitrite, UA: POSITIVE
Protein, UA: NEGATIVE
Spec Grav, UA: 1.01 (ref 1.010–1.025)
Urobilinogen, UA: 0.2 E.U./dL
pH, UA: 6 (ref 5.0–8.0)

## 2019-02-12 LAB — BASIC METABOLIC PANEL
BUN: 17 mg/dL (ref 6–23)
CO2: 22 mEq/L (ref 19–32)
Calcium: 9.4 mg/dL (ref 8.4–10.5)
Chloride: 93 mEq/L — ABNORMAL LOW (ref 96–112)
Creatinine, Ser: 1.1 mg/dL (ref 0.40–1.20)
GFR: 47.72 mL/min — ABNORMAL LOW (ref >60.00)
Glucose, Bld: 96 mg/dL (ref 70–99)
Potassium: 4.7 mEq/L (ref 3.5–5.1)
Sodium: 123 mEq/L — ABNORMAL LOW (ref 135–145)

## 2019-02-12 NOTE — Progress Notes (Signed)
Please sent urine for culture.

## 2019-02-12 NOTE — Addendum Note (Signed)
Addended by: Lynnea Ferrier on: 02/12/2019 02:52 PM   Modules accepted: Orders

## 2019-02-13 NOTE — Progress Notes (Signed)
Culture added 02/12/2019.

## 2019-02-15 ENCOUNTER — Telehealth: Payer: Self-pay | Admitting: Family Medicine

## 2019-02-15 ENCOUNTER — Other Ambulatory Visit: Payer: Self-pay

## 2019-02-15 ENCOUNTER — Other Ambulatory Visit: Payer: Self-pay | Admitting: Family Medicine

## 2019-02-15 DIAGNOSIS — E871 Hypo-osmolality and hyponatremia: Secondary | ICD-10-CM

## 2019-02-15 DIAGNOSIS — R7989 Other specified abnormal findings of blood chemistry: Secondary | ICD-10-CM

## 2019-02-15 LAB — URINE CULTURE
MICRO NUMBER:: 706665
SPECIMEN QUALITY:: ADEQUATE

## 2019-02-15 MED ORDER — CEPHALEXIN 500 MG PO CAPS: 500.0000 mg | ORAL_CAPSULE | Freq: Two times a day (BID) | ORAL | 0 refills | Status: AC

## 2019-02-15 NOTE — Telephone Encounter (Signed)
Pt states she was just put on antibiotics for a kidney infection and the yeast infection that she did have is not totally gone.  Pt is worried her yeast infection will now get worse and wants to know if she can have additional medication for the yeast infection. Pt states she was given fluconazole.   Pt uses  [id number] - [place], [address] [phone] (Phone) [phone] (Fax)

## 2019-02-15 NOTE — Progress Notes (Signed)
Urine culture shows that she has a UTI.  I have called in an antibiotic for her to start.   Her kidney function has improved some but her sodium remains low.  I would like for her to see a kidney specialist for this.  Thanks!

## 2019-02-15 NOTE — Telephone Encounter (Signed)
Please advise 

## 2019-02-16 ENCOUNTER — Other Ambulatory Visit: Payer: Self-pay | Admitting: Family Medicine

## 2019-02-16 MED ORDER — FLUCONAZOLE 150 MG PO TABS: 150.0000 mg | ORAL_TABLET | Freq: Once | ORAL | 0 refills | Status: AC

## 2019-02-16 NOTE — Telephone Encounter (Signed)
I called and spoke with pt. She is aware that Rx was sent in.

## 2019-02-16 NOTE — Telephone Encounter (Signed)
Diflucan sent in

## 2019-02-27 ENCOUNTER — Telehealth: Payer: Self-pay

## 2019-02-27 NOTE — Telephone Encounter (Signed)
Copied from [place] [id number]. Topic: General - Call Back - No Documentation >> Feb 26, 2019  3:42 PM Erick Blinks wrote: Reason for CRM: Pt called requesting nurse call back to discuss Kidney labs. Please advise Best contact: [phone]

## 2019-02-27 NOTE — Telephone Encounter (Signed)
Spoke with pt. She wanted to understand what she was needing a kidney specialist for. Explained labs pt verbalized understanding and pt has appointment for 9/1

## 2019-03-23 ENCOUNTER — Encounter: Payer: Self-pay | Admitting: Family Medicine

## 2019-03-23 ENCOUNTER — Ambulatory Visit: Payer: Medicare Other | Admitting: Family Medicine

## 2019-03-23 ENCOUNTER — Other Ambulatory Visit: Payer: Self-pay

## 2019-03-23 ENCOUNTER — Other Ambulatory Visit: Payer: Self-pay | Admitting: Family Medicine

## 2019-03-23 ENCOUNTER — Ambulatory Visit: Payer: Self-pay | Admitting: *Deleted

## 2019-03-23 MED ORDER — TRIAMCINOLONE ACETONIDE 0.1 % EX CREA: 1.0000 "application " | TOPICAL_CREAM | Freq: Two times a day (BID) | CUTANEOUS | 0 refills | Status: DC

## 2019-03-23 NOTE — Telephone Encounter (Signed)
Complains of rash- 3 days- anterior R forearm, across the back of neck and slightly on R forearm. Patient had her hair permed about 1 week ago and she has noticed this red, bumpy burning area that has developed since then- possible dermatitis. Call to office for appointment.  Reason for Disposition . [1] Applying cream or ointment AND [2] causes severe itch, burning or pain    Zinc oxide, cocoanut oil and lotion has been used  Answer Assessment - Initial Assessment Questions 1. APPEARANCE of RASH: "Describe the rash."      Under the skin- feels warm-irritated with bump 2. LOCATION: "Where is the rash located?"      R arm and neck- and seems to be slight on L 3. NUMBER: "How many spots are there?"      continuous under the skin 4. SIZE: "How big are the spots?" (Inches, centimeters or compare to size of a coin)      Under the skin 5. ONSET: "When did the rash start?"      Perm about 1 week ago- had place on scalp 6. ITCHING: "Does the rash itch?" If so, ask: "How bad is the itch?"  (Scale 1-10; or mild, moderate, severe)     no 7. PAIN: "Does the rash hurt?" If so, ask: "How bad is the pain?"  (Scale 1-10; or mild, moderate, severe)     No- burns a little bit 8. OTHER SYMPTOMS: "Do you have any other symptoms?" (e.g., fever)     No fever 9. PREGNANCY: "Is there any chance you are pregnant?" "When was your last menstrual period?"     n/a  Protocols used: RASH OR REDNESS - LOCALIZED-A-AH

## 2019-03-27 ENCOUNTER — Other Ambulatory Visit: Payer: Self-pay | Admitting: Family Medicine

## 2019-03-27 ENCOUNTER — Encounter: Payer: Self-pay | Admitting: Family Medicine

## 2019-03-27 MED ORDER — FLUCONAZOLE 150 MG PO TABS: 150.0000 mg | ORAL_TABLET | Freq: Once | ORAL | 0 refills | Status: AC

## 2019-04-16 ENCOUNTER — Other Ambulatory Visit: Payer: Self-pay

## 2019-04-16 ENCOUNTER — Encounter: Payer: Self-pay | Admitting: Family Medicine

## 2019-04-16 ENCOUNTER — Encounter (INDEPENDENT_AMBULATORY_CARE_PROVIDER_SITE_OTHER): Payer: Medicare Other | Admitting: Family Medicine

## 2019-04-16 VITALS — HR 89 | Ht 67.0 in | Wt 141.0 lb

## 2019-04-16 DIAGNOSIS — E871 Hypo-osmolality and hyponatremia: Secondary | ICD-10-CM

## 2019-04-16 DIAGNOSIS — E538 Deficiency of other specified B group vitamins: Secondary | ICD-10-CM

## 2019-04-16 DIAGNOSIS — N289 Disorder of kidney and ureter, unspecified: Secondary | ICD-10-CM

## 2019-04-16 DIAGNOSIS — D509 Iron deficiency anemia, unspecified: Secondary | ICD-10-CM

## 2019-04-16 LAB — POCT URINALYSIS DIPSTICK
Bilirubin, UA: NEGATIVE
Glucose, UA: NEGATIVE
Ketones, UA: NEGATIVE
Nitrite, UA: POSITIVE
Protein, UA: NEGATIVE
Spec Grav, UA: 1.01 (ref 1.010–1.025)
Urobilinogen, UA: 0.2 E.U./dL
pH, UA: 6 (ref 5.0–8.0)

## 2019-04-16 LAB — CBC
HCT: 34.6 % — ABNORMAL LOW (ref 36.0–46.0)
Hemoglobin: 11.6 g/dL — ABNORMAL LOW (ref 12.0–15.0)
MCHC: 33.7 g/dL (ref 30.0–36.0)
MCV: 91.5 fl (ref 78.0–100.0)
Platelets: 196 10*3/uL (ref 150.0–400.0)
RBC: 3.78 Mil/uL — ABNORMAL LOW (ref 3.87–5.11)
RDW: 15.9 % — ABNORMAL HIGH (ref 11.5–15.5)
WBC: 4.3 10*3/uL (ref 4.0–10.5)

## 2019-04-16 LAB — FERRITIN: Ferritin: 59.4 ng/mL (ref 10.0–291.0)

## 2019-04-16 LAB — VITAMIN D 25 HYDROXY (VIT D DEFICIENCY, FRACTURES): VITD: 52.33 ng/mL (ref 30.00–100.00)

## 2019-04-16 LAB — VITAMIN B12: Vitamin B-12: 811 pg/mL (ref 211–911)

## 2019-04-16 NOTE — Progress Notes (Signed)
Patient needing labs only.  This encounter was created in error - please disregard.

## 2019-04-17 LAB — RENAL PROFILE WITH ESTIMATED GFR
Albumin: 3.9 g/dL (ref 3.6–5.1)
BUN/Creatinine Ratio: 11 (calc) (ref 6–22)
BUN: 11 mg/dL (ref 7–25)
CO2: 21 mmol/L (ref 20–32)
Calcium: 8.9 mg/dL (ref 8.6–10.4)
Chloride: 102 mmol/L (ref 98–110)
Creat: 1.01 mg/dL — ABNORMAL HIGH (ref 0.60–0.88)
GFR, Est African American: 61 mL/min/{1.73_m2} (ref >=60)
GFR, Est Non African American: 53 mL/min/{1.73_m2} — ABNORMAL LOW (ref >=60)
Glucose, Bld: 94 mg/dL (ref 65–99)
Phosphorus: 3.7 mg/dL (ref 2.1–4.3)
Potassium: 4 mmol/L (ref 3.5–5.3)
Sodium: 133 mmol/L — ABNORMAL LOW (ref 135–146)

## 2019-04-17 LAB — PROTEIN / CREATININE RATIO, URINE
Creatinine, Urine: 50 mg/dL (ref 20–275)
Protein/Creat Ratio: 380 mg/g creat — ABNORMAL HIGH (ref 21–161)
Protein/Creatinine Ratio: 0.38 mg/mg creat — ABNORMAL HIGH (ref 0.021–0.16)
Total Protein, Urine: 19 mg/dL (ref 5–24)

## 2019-04-18 ENCOUNTER — Other Ambulatory Visit: Payer: Self-pay | Admitting: Family Medicine

## 2019-04-18 LAB — URINE CULTURE
MICRO NUMBER:: 928959
SPECIMEN QUALITY:: ADEQUATE

## 2019-04-18 MED ORDER — CIPROFLOXACIN HCL 500 MG PO TABS: 500.0000 mg | ORAL_TABLET | Freq: Two times a day (BID) | ORAL | 0 refills | Status: AC

## 2019-04-18 MED ORDER — FLUCONAZOLE 150 MG PO TABS: 150.0000 mg | ORAL_TABLET | Freq: Once | ORAL | 0 refills | Status: AC

## 2019-04-18 NOTE — Progress Notes (Signed)
Please let her know that she has a urinary tract infection.  I have sent over an antibiotic that should clear this up.   Kidney function is stable and sodium levels have increased some.  Iron levels have improved and her hemoglobin is much better.  I would like for her to continue on iron supplement for 1 additional month.   B12 is looking good and I want her to continue on this but decrease to 523mcg daily.   Please forward results to her nephrologist, Dr. Royce Macadamia at [facility] as well.  Thanks!

## 2019-04-19 ENCOUNTER — Other Ambulatory Visit: Payer: Self-pay

## 2019-04-20 MED ORDER — TRIAMCINOLONE ACETONIDE 0.1 % EX CREA: 1.0000 "application " | TOPICAL_CREAM | Freq: Two times a day (BID) | CUTANEOUS | 0 refills | Status: DC

## 2019-07-23 ENCOUNTER — Telehealth: Payer: Self-pay

## 2019-07-23 NOTE — Telephone Encounter (Signed)
Copied from [place] [phone]. Topic: Appointment Scheduling - Transfer of Care >> Jul 23, 2019 12:56 PM Rayann Heman wrote: Pt is requesting to transfer FROM: Zigmund Daniel  Pt is requesting to transfer TO: Dr C  Reason for requested transfer: Zigmund Daniel leaving   Send CRM to patient's current PCP (transferring FROM).

## 2019-07-25 NOTE — Telephone Encounter (Signed)
Ok with me 

## 2019-08-11 ENCOUNTER — Other Ambulatory Visit: Payer: Self-pay | Admitting: Family Medicine

## 2019-08-16 ENCOUNTER — Other Ambulatory Visit: Payer: Self-pay | Admitting: Family Medicine

## 2019-08-20 ENCOUNTER — Other Ambulatory Visit: Payer: Self-pay | Admitting: Family Medicine

## 2019-08-20 NOTE — Telephone Encounter (Signed)
Last OV 01/15/19 Last fill 11/15/18  #90/2

## 2019-11-13 ENCOUNTER — Other Ambulatory Visit: Payer: Self-pay

## 2019-11-14 ENCOUNTER — Ambulatory Visit: Payer: Medicare PPO | Admitting: Family Medicine

## 2019-11-14 ENCOUNTER — Encounter: Payer: Self-pay | Admitting: Family Medicine

## 2019-11-14 VITALS — BP 142/90 | HR 88 | Temp 98.4°F | Ht 67.0 in | Wt 145.8 lb

## 2019-11-14 DIAGNOSIS — I10 Essential (primary) hypertension: Secondary | ICD-10-CM

## 2019-11-14 DIAGNOSIS — R197 Diarrhea, unspecified: Secondary | ICD-10-CM | POA: Diagnosis not present

## 2019-11-14 DIAGNOSIS — R251 Tremor, unspecified: Secondary | ICD-10-CM

## 2019-11-14 NOTE — Patient Instructions (Addendum)
Benign essential tremor   We will check your electrolytes (sodium, potassium, calcium, magnesium) and thyroid function.  Ok to take imodium as needed

## 2019-11-14 NOTE — Progress Notes (Signed)
Christy Wade is a 84 y.o. female  Chief Complaint  Patient presents with  . Establish Care    Pt here for a TOC visit. Pt c/o diarrhea and would like to discuss some medications.    HPI: Christy Wade is a 84 y.o. female here as a TOC , previous PCP Dr. Zigmund Daniel. 1. She notes mild tremor in B/L hand, intermittent x about 6 months. Stops when she touches something. Does not interfere with her ADL's, quality of life, etc. She wonders if thyroid medication is causing this although she thinks this is unlikely due to normal TFTs and being on med x decades. She also wonders about norvasc causing tremor as tremor is listed as side effect on package insert. She has been on this x > 1 year. She thinks anxiety may influence her tremor.  2. Diarrhea - 1-2 loose BM 2-3 days per week, mainly in PM. BM in AM is normal. This has been going on x years since she had small bowel resection in 2016. No blood in stool. No weight loss. No fever, chills. Takes 1 tab of imodium PRN and no further issues. Pt wants to know if ok to keep taking imodium PRN. She does not take more than 3 tabs per week. 3. Home BP in normal range when pt checks periodically.    Past Medical History:  Diagnosis Date  . Anemia    on iron now  . Hypertension   . Hypothyroidism   . Osteoporosis   . PONV (postoperative nausea and vomiting)   . Thyroid disease   . Varicose vein     Past Surgical History:  Procedure Laterality Date  . ABDOMINAL HYSTERECTOMY  1975   partial  . APPENDECTOMY  1975   with hysterectomy  . HEMORROIDECTOMY     [phone]  . LAPAROSCOPIC SMALL BOWEL RESECTION N/A 11/29/2014   Procedure: LAPAROSCOPIC SMALL INTESTINE RESECTION;  Surgeon: Michael Boston, MD;  Location: [facility];  Service: General;  Laterality: N/A;  . VARICOSE VEIN SURGERY Left 1971    Social History   Socioeconomic History  . Marital status: Widowed    Spouse name: Not on file  . Number of children: Not on file  . Years of  education: Not on file  . Highest education level: Not on file  Occupational History  . Not on file  Tobacco Use  . Smoking status: Never Smoker  . Smokeless tobacco: Never Used  Substance and Sexual Activity  . Alcohol use: No    Alcohol/week: 0.0 standard drinks  . Drug use: No  . Sexual activity: Not Currently  Other Topics Concern  . Not on file  Social History Narrative  . Not on file   Social Determinants of Health   Financial Resource Strain:   . Difficulty of Paying Living Expenses:   Food Insecurity:   . Worried About Charity fundraiser in the Last Year:   . Arboriculturist in the Last Year:   Transportation Needs:   . Film/video editor (Medical):   Marland Kitchen Lack of Transportation (Non-Medical):   Physical Activity:   . Days of Exercise per Week:   . Minutes of Exercise per Session:   Stress:   . Feeling of Stress :   Social Connections:   . Frequency of Communication with Friends and Family:   . Frequency of Social Gatherings with Friends and Family:   . Attends Religious Services:   . Active Member of Clubs  or Organizations:   . Attends Archivist Meetings:   Marland Kitchen Marital Status:   Intimate Partner Violence:   . Fear of Current or Ex-Partner:   . Emotionally Abused:   Marland Kitchen Physically Abused:   . Sexually Abused:     Family History  Problem Relation Age of Onset  . Bone cancer Mother   . Diabetes Father   . Stroke Father   . Breast cancer Sister   . Stomach cancer Neg Hx   . Colon cancer Neg Hx      Immunization History  Administered Date(s) Administered  . Influenza Whole 07/19/2005, 04/26/2008, 04/18/2009  . Influenza, High Dose Seasonal PF 04/11/2013, 04/03/2015  . Influenza-Unspecified 04/18/2014, 04/13/2015, 03/19/2018  . Moderna SARS-COVID-2 Vaccination 08/05/2019, 08/10/2019  . Pneumococcal Conjugate-13 09/20/2013  . Pneumococcal Polysaccharide-23 04/18/2006, 06/30/2011  . Td 12/06/2005, 05/15/2008  . Tdap 05/30/2018  . Zoster  01/03/2008  . Zoster Recombinat (Shingrix) 12/09/2017, 02/18/2018    Outpatient Encounter Medications as of 11/14/2019  Medication Sig  . amLODipine (NORVASC) 5 MG tablet TAKE 1 TABLET(5 MG) BY MOUTH DAILY  . CALCIUM-VITAMIN D PO Take 1 tablet by mouth daily after lunch.  . cycloSPORINE (RESTASIS) 0.05 % ophthalmic emulsion 1 drop 2 (two) times daily.  Marland Kitchen levothyroxine (SYNTHROID) 50 MCG tablet TAKE 1 TABLET BY MOUTH EVERY MORNING  . lisinopril (ZESTRIL) 20 MG tablet TAKE 1 TABLET BY MOUTH EVERY DAY  . Multiple Vitamin (MULTIVITAMIN) tablet Take 1 tablet by mouth daily with lunch.   . traMADol (ULTRAM) 50 MG tablet TAKE 1 TABLET(50 MG) BY MOUTH EVERY 8 HOURS AS NEEDED  . Ferrous Sulfate (IRON) 325 (65 Fe) MG TABS Take by mouth daily.  Marland Kitchen triamcinolone cream (KENALOG) 0.1 % Apply 1 application topically 2 (two) times daily.  . vitamin B-12 (CYANOCOBALAMIN) 500 MCG tablet Take 1,000 mcg by mouth daily.  . [DISCONTINUED] polyvinyl alcohol (LIQUIFILM TEARS) 1.4 % ophthalmic solution Place 1-2 drops into both eyes daily as needed for dry eyes.   No facility-administered encounter medications on file as of 11/14/2019.     ROS: Pertinent positives and negatives noted in HPI. Remainder of ROS non-contributory   Allergies  Allergen Reactions  . Codeine Nausea Only    REACTION: nausea  . Sulfamethoxazole Rash    REACTION: rash    BP (!) 142/90 (BP Location: Left Arm, Patient Position: Sitting, Cuff Size: Normal)   Pulse 88   Temp 98.4 F (36.9 C) (Temporal)   Ht 5\' 7"  (1.702 m)   Wt 145 lb 12.8 oz (66.1 kg)   SpO2 97%   BMI 22.84 kg/m   BP Readings from Last 3 Encounters:  11/14/19 (!) 142/90  02/06/19 128/80  01/15/19 136/76     Physical Exam  Constitutional: She is oriented to person, place, and time. She appears well-developed and well-nourished. No distress.  Neck: No thyromegaly present.  Cardiovascular: Normal rate, regular rhythm and intact distal pulses.   Pulmonary/Chest: No respiratory distress.  Musculoskeletal:        General: No edema.     Cervical back: Neck supple.  Lymphadenopathy:    She has no cervical adenopathy.  Neurological: She is alert and oriented to person, place, and time. She exhibits normal muscle tone. Coordination normal.  Psychiatric: She has a normal mood and affect. Her behavior is normal.     A/P:  1. Tremor of both hands - likely benign, some anxiety component/correlation, intermittent - Basic metabolic panel - Magnesium - TSH  2. Diarrhea,  unspecified type - chronic, unchanged since small bowel resection in 2016 - takes imodium PRN (1-3 tabs per week) - ok to continue this  3. Essential hypertension - home BP readings < 140/< 90 consistently - likely elevated due to being in PCP office - cont current meds   This visit occurred during the SARS-CoV-2 public health emergency.  Safety protocols were in place, including screening questions prior to the visit, additional usage of staff PPE, and extensive cleaning of exam room while observing appropriate contact time as indicated for disinfecting solutions.

## 2019-11-15 LAB — BASIC METABOLIC PANEL
BUN: 17 mg/dL (ref 6–23)
CO2: 24 mEq/L (ref 19–32)
Calcium: 9.1 mg/dL (ref 8.4–10.5)
Chloride: 101 mEq/L (ref 96–112)
Creatinine, Ser: 1.12 mg/dL (ref 0.40–1.20)
GFR: 46.65 mL/min — ABNORMAL LOW (ref >60.00)
Glucose, Bld: 97 mg/dL (ref 70–99)
Potassium: 4.5 mEq/L (ref 3.5–5.1)
Sodium: 133 mEq/L — ABNORMAL LOW (ref 135–145)

## 2019-11-15 LAB — MAGNESIUM: Magnesium: 2 mg/dL (ref 1.5–2.5)

## 2019-11-15 LAB — TSH: TSH: 4.27 u[IU]/mL (ref 0.35–4.50)

## 2019-12-19 ENCOUNTER — Other Ambulatory Visit: Payer: Self-pay

## 2019-12-20 ENCOUNTER — Ambulatory Visit (INDEPENDENT_AMBULATORY_CARE_PROVIDER_SITE_OTHER): Payer: Medicare PPO | Admitting: Family Medicine

## 2019-12-20 ENCOUNTER — Encounter: Payer: Self-pay | Admitting: Family Medicine

## 2019-12-20 VITALS — BP 158/88 | HR 89 | Temp 98.0°F | Ht 67.0 in | Wt 147.2 lb

## 2019-12-20 DIAGNOSIS — M8589 Other specified disorders of bone density and structure, multiple sites: Secondary | ICD-10-CM | POA: Diagnosis not present

## 2019-12-20 DIAGNOSIS — I10 Essential (primary) hypertension: Secondary | ICD-10-CM

## 2019-12-20 DIAGNOSIS — E039 Hypothyroidism, unspecified: Secondary | ICD-10-CM

## 2019-12-20 MED ORDER — LISINOPRIL-HYDROCHLOROTHIAZIDE 20-25 MG PO TABS: 1.0000 | ORAL_TABLET | Freq: Every day | ORAL | 3 refills | Status: DC

## 2019-12-20 NOTE — Patient Instructions (Addendum)
Bone density scan due in 05/2020  Stop amlodipine (norvasc) 5mg  daily Take 2 20mg  lisinopril (40mg  total) until these run out New blood pressure tablet will be lisinopril/HCTZ 20-25mg  1 tab daily

## 2019-12-20 NOTE — Progress Notes (Signed)
Christy Wade is a 84 y.o. female  Chief Complaint  Patient presents with  . Annual Exam    Pt here to TOC.  Pt would like to discuss medications today, (Amlopine)    HPI: Christy Wade is a 84 y.o. female here for her annual exam and f/u on chronic medial issues including HTN, hypothyroidism, anemia (iron deficiency), osteoporosis.  No acute issues today. Doing well overall. Pt does want to discuss amlodipine as she wonders if it is causing her hands to shake, Rt > Lt.  No falls. Appetite and sleep at fine, baseline.   Last Dexa - 05/2018 - T-score = -1.7 (osteopenia). Takes calcium and Vit D supplement. Last Vit D level checked 8-57mo ago and at goal.   BP at goal when she periodically checks at home. Home this AM 147/82.  She had labs done in 4/20201 - see below.  Lab Results  Component Value Date   TSH 4.27 11/14/2019   Lab Results  Component Value Date   CREATININE 1.12 11/14/2019   Lab Results  Component Value Date   BUN 17 11/14/2019   Lab Results  Component Value Date   NA 133 (L) 11/14/2019   K 4.5 11/14/2019   CL 101 11/14/2019   CO2 24 11/14/2019   Last vitamin D Lab Results  Component Value Date   VD25OH 52.33 04/16/2019   Lab Results  Component Value Date   WBC 4.3 04/16/2019   HGB 11.6 (L) 04/16/2019   HCT 34.6 (L) 04/16/2019   MCV 91.5 04/16/2019   PLT 196.0 04/16/2019     Past Medical History:  Diagnosis Date  . Anemia    on iron now  . Hypertension   . Hypothyroidism   . Osteoporosis   . PONV (postoperative nausea and vomiting)   . Thyroid disease   . Varicose vein     Past Surgical History:  Procedure Laterality Date  . ABDOMINAL HYSTERECTOMY  1975   partial  . APPENDECTOMY  1975   with hysterectomy  . HEMORROIDECTOMY     [phone]  . LAPAROSCOPIC SMALL BOWEL RESECTION N/A 11/29/2014   Procedure: LAPAROSCOPIC SMALL INTESTINE RESECTION;  Surgeon: Michael Boston, MD;  Location: [facility];  Service: General;  Laterality:  N/A;  . VARICOSE VEIN SURGERY Left 1971    Social History   Socioeconomic History  . Marital status: Widowed    Spouse name: Not on file  . Number of children: Not on file  . Years of education: Not on file  . Highest education level: Not on file  Occupational History  . Not on file  Tobacco Use  . Smoking status: Never Smoker  . Smokeless tobacco: Never Used  Substance and Sexual Activity  . Alcohol use: No    Alcohol/week: 0.0 standard drinks  . Drug use: No  . Sexual activity: Not Currently  Other Topics Concern  . Not on file  Social History Narrative  . Not on file   Social Determinants of Health   Financial Resource Strain:   . Difficulty of Paying Living Expenses:   Food Insecurity:   . Worried About Charity fundraiser in the Last Year:   . Arboriculturist in the Last Year:   Transportation Needs:   . Film/video editor (Medical):   Marland Kitchen Lack of Transportation (Non-Medical):   Physical Activity:   . Days of Exercise per Week:   . Minutes of Exercise per Session:   Stress:   .  Feeling of Stress :   Social Connections:   . Frequency of Communication with Friends and Family:   . Frequency of Social Gatherings with Friends and Family:   . Attends Religious Services:   . Active Member of Clubs or Organizations:   . Attends Archivist Meetings:   Marland Kitchen Marital Status:   Intimate Partner Violence:   . Fear of Current or Ex-Partner:   . Emotionally Abused:   Marland Kitchen Physically Abused:   . Sexually Abused:     Family History  Problem Relation Age of Onset  . Bone cancer Mother   . Diabetes Father   . Stroke Father   . Breast cancer Sister   . Stomach cancer Neg Hx   . Colon cancer Neg Hx      Immunization History  Administered Date(s) Administered  . Influenza Whole 07/19/2005, 04/26/2008, 04/18/2009  . Influenza, High Dose Seasonal PF 04/11/2013, 04/03/2015  . Influenza-Unspecified 04/18/2014, 04/13/2015, 03/19/2018  . Moderna SARS-COVID-2  Vaccination 08/05/2019, 08/10/2019  . Pneumococcal Conjugate-13 09/20/2013  . Pneumococcal Polysaccharide-23 04/18/2006, 06/30/2011  . Td 12/06/2005, 05/15/2008  . Tdap 05/30/2018  . Zoster 01/03/2008  . Zoster Recombinat (Shingrix) 12/09/2017, 02/18/2018    Outpatient Encounter Medications as of 12/20/2019  Medication Sig  . amLODipine (NORVASC) 5 MG tablet TAKE 1 TABLET(5 MG) BY MOUTH DAILY  . CALCIUM-VITAMIN D PO Take 1 tablet by mouth daily after lunch.  . cycloSPORINE (RESTASIS) 0.05 % ophthalmic emulsion 1 drop 2 (two) times daily.  Marland Kitchen levothyroxine (SYNTHROID) 50 MCG tablet TAKE 1 TABLET BY MOUTH EVERY MORNING  . lisinopril (ZESTRIL) 20 MG tablet TAKE 1 TABLET BY MOUTH EVERY DAY  . Multiple Vitamin (MULTIVITAMIN) tablet Take 1 tablet by mouth daily with lunch.   . traMADol (ULTRAM) 50 MG tablet TAKE 1 TABLET(50 MG) BY MOUTH EVERY 8 HOURS AS NEEDED  . Ferrous Sulfate (IRON) 325 (65 Fe) MG TABS Take by mouth daily.  Marland Kitchen triamcinolone cream (KENALOG) 0.1 % Apply 1 application topically 2 (two) times daily. (Patient not taking: Reported on 12/20/2019)  . vitamin B-12 (CYANOCOBALAMIN) 500 MCG tablet Take 1,000 mcg by mouth daily.   No facility-administered encounter medications on file as of 12/20/2019.     ROS: Gen: no fever, chills  Skin: no rash, itching Eyes: no blurry vision, double vision Resp: no cough, wheeze,SOB CV: no CP, palpitations, LE edema,  GI: no heartburn, n/v/d/c, abd pain GU: no dysuria, urgency, frequency MSK: no joint pain, myalgias, back pain Neuro: no dizziness, headache, weakness, + tremor (intermittent) in Rt > Lt hand   Allergies  Allergen Reactions  . Codeine Nausea Only    REACTION: nausea  . Sulfamethoxazole Rash    REACTION: rash    BP (!) 160/90 (BP Location: Left Arm, Patient Position: Sitting, Cuff Size: Normal)   Pulse 89   Temp 98 F (36.7 C) (Temporal)   Ht 5\' 7"  (1.702 m)   Wt 147 lb 3.2 oz (66.8 kg)   SpO2 98%   BMI 23.05 kg/m     BP Readings from Last 3 Encounters:  12/20/19 (!) 160/90  11/14/19 (!) 142/90  02/06/19 128/80   Pulse Readings from Last 3 Encounters:  12/20/19 89  11/14/19 88  04/16/19 89   Wt Readings from Last 3 Encounters:  12/20/19 147 lb 3.2 oz (66.8 kg)  11/14/19 145 lb 12.8 oz (66.1 kg)  04/16/19 141 lb (64 kg)    Physical Exam  Constitutional: She is oriented to person, place, and  time. She appears well-developed and well-nourished. No distress.  Cardiovascular: Normal rate, regular rhythm and normal heart sounds.  Pulmonary/Chest: Effort normal and breath sounds normal. No respiratory distress.  Musculoskeletal:        General: No edema.  Neurological: She is alert and oriented to person, place, and time.  Psychiatric: She has a normal mood and affect.     A/P:  1. Hypothyroidism, unspecified type - stable, TFTs WNL in 10/2019 - cont with current med/dose  2. Essential hypertension - stop amlodipine - pt would like to use up the lisinopril 20mg  tabs that she has so she will take 2 tabs together daily until she runs out then start new med Rx: - lisinopril-hydrochlorothiazide (ZESTORETIC) 20-25 MG tablet; Take 1 tablet by mouth daily.  Dispense: 90 tablet; Refill: 3 - f/u via telephone appt in 2-3 wks  3. Osteopenia of multiple sites - cont calcium and Vit D supplement - DG Bone Density; Future - due in 05/2020    This visit occurred during the SARS-CoV-2 public health emergency.  Safety protocols were in place, including screening questions prior to the visit, additional usage of staff PPE, and extensive cleaning of exam room while observing appropriate contact time as indicated for disinfecting solutions.

## 2019-12-26 ENCOUNTER — Telehealth: Payer: Self-pay | Admitting: Family Medicine

## 2019-12-26 NOTE — Telephone Encounter (Signed)
Spoke with patient to schedule AWV, she stated to call next week she was busy at the time

## 2019-12-27 DIAGNOSIS — H3562 Retinal hemorrhage, left eye: Secondary | ICD-10-CM | POA: Diagnosis not present

## 2019-12-27 DIAGNOSIS — H25043 Posterior subcapsular polar age-related cataract, bilateral: Secondary | ICD-10-CM | POA: Diagnosis not present

## 2019-12-27 DIAGNOSIS — H2513 Age-related nuclear cataract, bilateral: Secondary | ICD-10-CM | POA: Diagnosis not present

## 2019-12-27 DIAGNOSIS — H02045 Spastic entropion of left lower eyelid: Secondary | ICD-10-CM | POA: Diagnosis not present

## 2019-12-28 ENCOUNTER — Telehealth: Payer: Self-pay | Admitting: Family Medicine

## 2019-12-28 NOTE — Telephone Encounter (Signed)
Patient called back and sated that she received a call to schedule her bone density but they told her she had to go back to the same office where she had  Bone scan previously done. Pls advise. CB is [phone]

## 2019-12-31 NOTE — Telephone Encounter (Signed)
Orders were changed to [facility] (last scan there). They have LM for pt to call and schedule. Their ph # is [phone].

## 2020-01-01 NOTE — Progress Notes (Signed)
Subjective:   Christy Wade is a 84 y.o. female who presents for Medicare Annual (Subsequent) preventive examination.  I connected with Ashlan today by telephone and verified that I am speaking with the correct person using two identifiers. Location patient: home Location provider: work Persons participating in the virtual visit: patient, Marine scientist.    I discussed the limitations, risks, security and privacy concerns of performing an evaluation and management service by telephone and the availability of in person appointments. I also discussed with the patient that there may be a patient responsible charge related to this service. The patient expressed understanding and verbally consented to this telephonic visit.    Interactive audio and video telecommunications were attempted between this provider and patient, however failed, due to patient having technical difficulties OR patient did not have access to video capability.  We continued and completed visit with audio only.  Some vital signs may be absent or patient reported.   Time Spent with patient on telephone encounter: 25 minutes  Review of Systems:   Cardiac Risk Factors include: advanced age (>43men, >55 women);hypertension     Objective:     Vitals: Ht 5\' 7"  (1.702 m)   Wt 147 lb (66.7 kg)   BMI 23.02 kg/m   Body mass index is 23.02 kg/m.  Advanced Directives 01/02/2020 11/29/2014 11/25/2014 09/30/2014  Does Patient Have a Medical Advance Directive? Yes Yes Yes Yes  Type of Paramedic of [place];Living will Living will;Healthcare Power of Attorney Living will;Healthcare Power of [place];Living will  Does patient want to make changes to medical advance directive? - No - Patient declined No - Patient declined -  Copy of Cecilia in Chart? No - copy requested - No - copy requested -    Tobacco Social History   Tobacco Use  Smoking Status Never Smoker    Smokeless Tobacco Never Used     Counseling given: Not Answered   Clinical Intake:  Pre-visit preparation completed: Yes  Pain : No/denies pain     Nutritional Status: BMI of 19-24  Normal Diabetes: No  How often do you need to have someone help you when you read instructions, pamphlets, or other written materials from your doctor or pharmacy?: 1 - Never What is the last grade level you completed in school?: College graduate  Interpreter Needed?: No  Information entered by :: Caroleen Hamman LPN  Past Medical History:  Diagnosis Date  . Anemia    on iron now  . Cataract   . Hypertension   . Hypothyroidism   . Osteoporosis   . PONV (postoperative nausea and vomiting)   . Thyroid disease   . Varicose vein    Past Surgical History:  Procedure Laterality Date  . ABDOMINAL HYSTERECTOMY  1975   partial  . APPENDECTOMY  1975   with hysterectomy  . HEMORROIDECTOMY     [phone]  . LAPAROSCOPIC SMALL BOWEL RESECTION N/A 11/29/2014   Procedure: LAPAROSCOPIC SMALL INTESTINE RESECTION;  Surgeon: Michael Boston, MD;  Location: [facility];  Service: General;  Laterality: N/A;  . VARICOSE VEIN SURGERY Left 1971   Family History  Problem Relation Age of Onset  . Bone cancer Mother   . Diabetes Father   . Stroke Father   . Breast cancer Sister   . Stomach cancer Neg Hx   . Colon cancer Neg Hx    Social History   Socioeconomic History  . Marital status: Widowed  Spouse name: Not on file  . Number of children: Not on file  . Years of education: Not on file  . Highest education level: Not on file  Occupational History  . Occupation: Retired  Tobacco Use  . Smoking status: Never Smoker  . Smokeless tobacco: Never Used  Vaping Use  . Vaping Use: Never used  Substance and Sexual Activity  . Alcohol use: No    Alcohol/week: 0.0 standard drinks  . Drug use: No  . Sexual activity: Not Currently  Other Topics Concern  . Not on file  Social History Narrative  .  Not on file   Social Determinants of Health   Financial Resource Strain: Low Risk   . Difficulty of Paying Living Expenses: Not hard at all  Food Insecurity: No Food Insecurity  . Worried About Charity fundraiser in the Last Year: Never true  . Ran Out of Food in the Last Year: Never true  Transportation Needs: No Transportation Needs  . Lack of Transportation (Medical): No  . Lack of Transportation (Non-Medical): No  Physical Activity: Inactive  . Days of Exercise per Week: 0 days  . Minutes of Exercise per Session: 0 min  Stress: No Stress Concern Present  . Feeling of Stress : Not at all  Social Connections: Moderately Integrated  . Frequency of Communication with Friends and Family: More than three times a week  . Frequency of Social Gatherings with Friends and Family: More than three times a week  . Attends Religious Services: More than 4 times per year  . Active Member of Clubs or Organizations: Yes  . Attends Archivist Meetings: More than 4 times per year  . Marital Status: Widowed    Outpatient Encounter Medications as of 01/02/2020  Medication Sig  . CALCIUM-VITAMIN D PO Take 1 tablet by mouth daily after lunch.  . cycloSPORINE (RESTASIS) 0.05 % ophthalmic emulsion 1 drop 2 (two) times daily.  Marland Kitchen levothyroxine (SYNTHROID) 50 MCG tablet TAKE 1 TABLET BY MOUTH EVERY MORNING  . lisinopril-hydrochlorothiazide (ZESTORETIC) 20-25 MG tablet Take 1 tablet by mouth daily.  . Multiple Vitamin (MULTIVITAMIN) tablet Take 1 tablet by mouth daily with lunch.   . traMADol (ULTRAM) 50 MG tablet TAKE 1 TABLET(50 MG) BY MOUTH EVERY 8 HOURS AS NEEDED  . vitamin B-12 (CYANOCOBALAMIN) 500 MCG tablet Take 1,000 mcg by mouth daily.   No facility-administered encounter medications on file as of 01/02/2020.    Activities of Daily Living In your present state of health, do you have any difficulty performing the following activities: 01/02/2020  Hearing? N  Vision? N  Difficulty  concentrating or making decisions? N  Walking or climbing stairs? N  Doing errands, shopping? N  Preparing Food and eating ? N  Using the Toilet? N  In the past six months, have you accidently leaked urine? N  Do you have problems with loss of bowel control? N  Managing your Medications? N  Managing your Finances? N  Housekeeping or managing your Housekeeping? N  Some recent data might be hidden    Patient Care Team: Ronnald Nian, DO as PCP - General (Family Medicine) Michael Boston, MD as Consulting Physician (General Surgery) Milus Banister, MD as Attending Physician (Gastroenterology)    Assessment:   This is a routine wellness examination for Eriel.  Exercise Activities and Dietary recommendations Current Exercise Habits: Home exercise routine, Type of exercise: walking, Time (Minutes): 15, Frequency (Times/Week): 6, Weekly Exercise (Minutes/Week): 90, Intensity: Mild  Goals Addressed            This Visit's Progress   . Patient Stated       Continue walking regimen & continue water intake       Fall Risk: Fall Risk  01/02/2020 01/15/2019 05/29/2018 10/31/2017 10/13/2016  Falls in the past year? 0 0 0 No No  Number falls in past yr: 0 0 - - -  Injury with Fall? 0 0 - - -    FALL RISK PREVENTION PERTAINING TO THE HOME:  Any stairs in or around the home? No    Home free of loose throw rugs in walkways, pet beds, electrical cords, etc? Yes  Adequate lighting in your home to reduce risk of falls? Yes   ASSISTIVE DEVICES UTILIZED TO PREVENT FALLS:  Life alert? No  Use of a cane, walker or w/c? No  Grab bars in the bathroom? Yes  Shower chair or bench in shower? No  Elevated toilet seat or a handicapped toilet? No   DME ORDERS:  DME order needed?  No   TIMED UP AND GO:  Was the test performed? No . Virtual Visit    Depression Screen PHQ 2/9 Scores 01/02/2020 01/15/2019 05/29/2018 10/31/2017  PHQ - 2 Score 0 0 0 0     Cognitive Function:     Patient reads, plays piano & does crossword puzzles     6CIT Screen 01/02/2020  What Year? 0 points  What month? 0 points  What time? 0 points  Count back from 20 0 points  Months in reverse 2 points  Repeat phrase 0 points  Total Score 2    Immunization History  Administered Date(s) Administered  . Influenza Whole 07/19/2005, 04/26/2008, 04/18/2009  . Influenza, High Dose Seasonal PF 04/11/2013, 04/03/2015  . Influenza-Unspecified 04/18/2014, 04/13/2015, 03/19/2018  . Moderna SARS-COVID-2 Vaccination 08/05/2019, 08/10/2019  . Pneumococcal Conjugate-13 09/20/2013  . Pneumococcal Polysaccharide-23 04/18/2006, 06/30/2011  . Td 12/06/2005, 05/15/2008  . Tdap 05/30/2018  . Zoster 01/03/2008  . Zoster Recombinat (Shingrix) 12/09/2017, 02/18/2018    Qualifies for Shingles Vaccine? Zostavax completed 01/03/2008 Shingrix completed 02/18/2018  Tdap: Up to date-05/30/2018  Flu Vaccine: Due 03/2020  Pneumococcal Vaccine: [id number] completed 09/20/2013 Pneumovax-23 completed 06/30/2011  Covid-19 Vaccine: Completed vaccines 08/10/2019  Screening Tests Health Maintenance  Topic Date Due  . INFLUENZA VACCINE  02/17/2020  . TETANUS/TDAP  05/30/2028  . DEXA SCAN  Completed  . COVID-19 Vaccine  Completed  . PNA vac Low Risk Adult  Completed    Cancer Screenings:  Colorectal Screening: Completed 09/30/2014  No longer required.  Mammogram: Completed 12/15/2015 No longer required.  Bone Density: Completed 06/08/2018  Lung Cancer Screening: (Low Dose CT Chest recommended if Age 76-80 years, 30 pack-year currently smoking OR have quit w/in 15years.) does not qualify.    Additional Screening:  Hepatitis C Screening: does not qualify  Vision Screening: Recommended annual ophthalmology exams for early detection of glaucoma and other disorders of the eye. Is the patient up to date with their annual eye exam?  Yes  Who is the provider or what is the name of the office in which the pt  attends annual eye exams? Dr Arsenio Loader  Dental Screening: Recommended annual dental exams for proper oral hygiene  Community Resource Referral:  CRR required this visit?  No       Plan:  I have personally reviewed and addressed the Medicare Annual Wellness questionnaire and have noted the following in the patient's chart:  A.  Medical and social history B. Use of alcohol, tobacco or illicit drugs  C. Current medications and supplements D. Functional ability and status E.  Nutritional status F.  Physical activity G. Advance directives H. List of other physicians I.  Hospitalizations, surgeries, and ER visits in previous 12 months J.  [place] such as hearing and vision if needed, cognitive and depression L. Referrals and appointments   In addition, I have reviewed and discussed with patient certain preventive protocols, quality metrics, and best practice recommendations. A written personalized care plan for preventive services as well as general preventive health recommendations were provided to patient.   Due to this being a telephonic visit, the after visit summary with patients personalized plan was offered to patient via mail or my-chart.  Per request, patient was mailed a copy of AVS.  Signed,    Marta Antu, LPN  9/81/1914 Nurse Health Advisor   Nurse Notes: None

## 2020-01-02 ENCOUNTER — Ambulatory Visit (INDEPENDENT_AMBULATORY_CARE_PROVIDER_SITE_OTHER): Payer: Medicare PPO

## 2020-01-02 VITALS — Ht 67.0 in | Wt 147.0 lb

## 2020-01-02 DIAGNOSIS — Z0000 Encounter for general adult medical examination without abnormal findings: Secondary | ICD-10-CM

## 2020-01-02 DIAGNOSIS — M8580 Other specified disorders of bone density and structure, unspecified site: Secondary | ICD-10-CM

## 2020-01-02 DIAGNOSIS — Z780 Asymptomatic menopausal state: Secondary | ICD-10-CM

## 2020-01-02 NOTE — Patient Instructions (Signed)
[name] , Thank you for taking time to come for your Medicare Wellness Visit. I appreciate your ongoing commitment to your health goals. Please review the following plan we discussed and let me know if I can assist you in the future.   Screening recommendations/referrals: Colonoscopy: Completed 09/30/2014- No longer indicated Mammogram: Completed5/29/2017-No longer indicated Bone Density: Completed 06/08/2018-Referral ordered Recommended yearly ophthalmology/optometry visit for glaucoma screening and checkup Recommended yearly dental visit for hygiene and checkup  Vaccinations: Influenza vaccine: Due 03/2020 Pneumococcal vaccine: Completed 09/20/2013 Tdap vaccine: Completed 05/30/2018-next due-05/30/2028 Shingles vaccine: Completed 02/18/2018   Covid-19:Completed 08/10/2019  Advanced directives: Bring a copy to next office visit  Conditions/risks identified: See problem list  Next appointment: Follow up in one year for your annual wellness visit  01/07/2021 @ 10:30   Preventive Care 65 Years and Older, Female Preventive care refers to lifestyle choices and visits with your health care provider that can promote health and wellness. What does preventive care include?  A yearly physical exam. This is also called an annual well check.  Dental exams once or twice a year.  Routine eye exams. Ask your health care provider how often you should have your eyes checked.  Personal lifestyle choices, including:  Daily care of your teeth and gums.  Regular physical activity.  Eating a healthy diet.  Avoiding tobacco and drug use.  Limiting alcohol use.  Practicing safe sex.  Taking low-dose aspirin every day.  Taking vitamin and mineral supplements as recommended by your health care provider. What happens during an annual well check? The services and screenings done by your health care provider during your annual well check will depend on your age, overall health, lifestyle risk  factors, and family history of disease. Counseling  Your health care provider may ask you questions about your:  Alcohol use.  Tobacco use.  Drug use.  Emotional well-being.  Home and relationship well-being.  Sexual activity.  Eating habits.  History of falls.  Memory and ability to understand (cognition).  Work and work Statistician.  Reproductive health. Screening  You may have the following tests or measurements:  Height, weight, and BMI.  Blood pressure.  Lipid and cholesterol levels. These may be checked every 5 years, or more frequently if you are over 40 years old.  Skin check.  Lung cancer screening. You may have this screening every year starting at age 59 if you have a 30-pack-year history of smoking and currently smoke or have quit within the past 15 years.  Fecal occult blood test (FOBT) of the stool. You may have this test every year starting at age 39.  Flexible sigmoidoscopy or colonoscopy. You may have a sigmoidoscopy every 5 years or a colonoscopy every 10 years starting at age 67.  Hepatitis C blood test.  Hepatitis B blood test.  Sexually transmitted disease (STD) testing.  Diabetes screening. This is done by checking your blood sugar (glucose) after you have not eaten for a while (fasting). You may have this done every 1-3 years.  Bone density scan. This is done to screen for osteoporosis. You may have this done starting at age 63.  Mammogram. This may be done every 1-2 years. Talk to your health care provider about how often you should have regular mammograms. Talk with your health care provider about your test results, treatment options, and if necessary, the need for more tests. Vaccines  Your health care provider may recommend certain vaccines, such as:  Influenza vaccine. This is recommended every  year.  Tetanus, diphtheria, and acellular pertussis (Tdap, Td) vaccine. You may need a Td booster every 10 years.  Zoster vaccine. You  may need this after age 21.  Pneumococcal 13-valent conjugate (PCV13) vaccine. One dose is recommended after age 79.  Pneumococcal polysaccharide (PPSV23) vaccine. One dose is recommended after age 63. Talk to your health care provider about which screenings and vaccines you need and how often you need them. This information is not intended to replace advice given to you by your health care provider. Make sure you discuss any questions you have with your health care provider. Document Released: 08/01/2015 Document Revised: 03/24/2016 Document Reviewed: 05/06/2015 Elsevier Interactive Patient Education  2017 [place] Prevention in the Home Falls can cause injuries. They can happen to people of all ages. There are many things you can do to make your home safe and to help prevent falls. What can I do on the outside of my home?  Regularly fix the edges of walkways and driveways and fix any cracks.  Remove anything that might make you trip as you walk through a door, such as a raised step or threshold.  Trim any bushes or trees on the path to your home.  Use bright outdoor lighting.  Clear any walking paths of anything that might make someone trip, such as rocks or tools.  Regularly check to see if handrails are loose or broken. Make sure that both sides of any steps have handrails.  Any raised decks and porches should have guardrails on the edges.  Have any leaves, snow, or ice cleared regularly.  Use sand or salt on walking paths during winter.  Clean up any spills in your garage right away. This includes oil or grease spills. What can I do in the bathroom?  Use night lights.  Install grab bars by the toilet and in the tub and shower. Do not use towel bars as grab bars.  Use non-skid mats or decals in the tub or shower.  If you need to sit down in the shower, use a plastic, non-slip stool.  Keep the floor dry. Clean up any water that spills on the floor as soon as  it happens.  Remove soap buildup in the tub or shower regularly.  Attach bath mats securely with double-sided non-slip rug tape.  Do not have throw rugs and other things on the floor that can make you trip. What can I do in the bedroom?  Use night lights.  Make sure that you have a light by your bed that is easy to reach.  Do not use any sheets or blankets that are too big for your bed. They should not hang down onto the floor.  Have a firm chair that has side arms. You can use this for support while you get dressed.  Do not have throw rugs and other things on the floor that can make you trip. What can I do in the kitchen?  Clean up any spills right away.  Avoid walking on wet floors.  Keep items that you use a lot in easy-to-reach places.  If you need to reach something above you, use a strong step stool that has a grab bar.  Keep electrical cords out of the way.  Do not use floor polish or wax that makes floors slippery. If you must use wax, use non-skid floor wax.  Do not have throw rugs and other things on the floor that can make you trip. What can  I do with my stairs?  Do not leave any items on the stairs.  Make sure that there are handrails on both sides of the stairs and use them. Fix handrails that are broken or loose. Make sure that handrails are as long as the stairways.  Check any carpeting to make sure that it is firmly attached to the stairs. Fix any carpet that is loose or worn.  Avoid having throw rugs at the top or bottom of the stairs. If you do have throw rugs, attach them to the floor with carpet tape.  Make sure that you have a light switch at the top of the stairs and the bottom of the stairs. If you do not have them, ask someone to add them for you. What else can I do to help prevent falls?  Wear shoes that:  Do not have high heels.  Have rubber bottoms.  Are comfortable and fit you well.  Are closed at the toe. Do not wear sandals.  If  you use a stepladder:  Make sure that it is fully opened. Do not climb a closed stepladder.  Make sure that both sides of the stepladder are locked into place.  Ask someone to hold it for you, if possible.  Clearly mark and make sure that you can see:  Any grab bars or handrails.  First and last steps.  Where the edge of each step is.  Use tools that help you move around (mobility aids) if they are needed. These include:  Canes.  Walkers.  Scooters.  Crutches.  Turn on the lights when you go into a dark area. Replace any light bulbs as soon as they burn out.  Set up your furniture so you have a clear path. Avoid moving your furniture around.  If any of your floors are uneven, fix them.  If there are any pets around you, be aware of where they are.  Review your medicines with your doctor. Some medicines can make you feel dizzy. This can increase your chance of falling. Ask your doctor what other things that you can do to help prevent falls. This information is not intended to replace advice given to you by your health care provider. Make sure you discuss any questions you have with your health care provider. Document Released: 05/01/2009 Document Revised: 12/11/2015 Document Reviewed: 08/09/2014 Elsevier Interactive Patient Education  2017 [employer].

## 2020-01-31 DIAGNOSIS — H2511 Age-related nuclear cataract, right eye: Secondary | ICD-10-CM | POA: Diagnosis not present

## 2020-01-31 DIAGNOSIS — H18413 Arcus senilis, bilateral: Secondary | ICD-10-CM | POA: Diagnosis not present

## 2020-01-31 DIAGNOSIS — H04123 Dry eye syndrome of bilateral lacrimal glands: Secondary | ICD-10-CM | POA: Diagnosis not present

## 2020-01-31 DIAGNOSIS — H25013 Cortical age-related cataract, bilateral: Secondary | ICD-10-CM | POA: Diagnosis not present

## 2020-01-31 DIAGNOSIS — H25043 Posterior subcapsular polar age-related cataract, bilateral: Secondary | ICD-10-CM | POA: Diagnosis not present

## 2020-01-31 DIAGNOSIS — H2513 Age-related nuclear cataract, bilateral: Secondary | ICD-10-CM | POA: Diagnosis not present

## 2020-03-04 ENCOUNTER — Telehealth: Payer: Self-pay | Admitting: Family Medicine

## 2020-03-04 DIAGNOSIS — R112 Nausea with vomiting, unspecified: Secondary | ICD-10-CM | POA: Diagnosis not present

## 2020-03-04 DIAGNOSIS — R8281 Pyuria: Secondary | ICD-10-CM | POA: Diagnosis not present

## 2020-03-04 DIAGNOSIS — R197 Diarrhea, unspecified: Secondary | ICD-10-CM | POA: Diagnosis not present

## 2020-03-04 DIAGNOSIS — Z20822 Contact with and (suspected) exposure to covid-19: Secondary | ICD-10-CM | POA: Diagnosis not present

## 2020-03-04 NOTE — Telephone Encounter (Addendum)
Gentry states she started to feel nauseated yesterday. She decided to only eat apple sauce and toast last night. She woke this morning feeling weak and had some vomiting. She continued to only drink fluids. She again vomited around 1 pm today. I advised to call and schedule an appointment for evaluation. She mentioned having mid chest pain and reports having acid reflux in the past. She does reports having diarrhea yesterday but states she does have chronic diarrhea. Denies fever, chills or sweats.

## 2020-03-04 NOTE — Telephone Encounter (Signed)
Charlotte please advise.  Pt states she is really tired and not wanting to eat but when she does she throws it back up. Pt reports no fever. She tried to get in with [place] but couldn't be seen [facility] November so she does have an appointment Thursday 03/06/2020 with Dr. Rodena Piety. She was wondering if there is anything she can do or take in the meantime to help her with the vomitting. Pt was instructed to stay well hydrated an if symptoms worse to go to ED.

## 2020-03-04 NOTE — Telephone Encounter (Addendum)
Patient sister called and stated that patient was experiencing fatigue, loss of appetite and occasionally vomiting. Due to no availability at our office patient stated that they were going to call another office to see if they could be see. Caller is asking for a call back. CB is  [phone]

## 2020-03-05 ENCOUNTER — Emergency Department (HOSPITAL_COMMUNITY): Payer: Medicare PPO

## 2020-03-05 ENCOUNTER — Other Ambulatory Visit: Payer: Self-pay

## 2020-03-05 ENCOUNTER — Inpatient Hospital Stay (HOSPITAL_COMMUNITY)
Admission: EM | Admit: 2020-03-05 | Discharge: 2020-03-14 | DRG: 644 | Disposition: A | Discharge status: Skilled Nursing Facility | Payer: Medicare PPO | Attending: Family Medicine | Admitting: Family Medicine

## 2020-03-05 ENCOUNTER — Encounter (HOSPITAL_COMMUNITY): Payer: Self-pay | Admitting: Emergency Medicine

## 2020-03-05 DIAGNOSIS — S2241XA Multiple fractures of ribs, right side, initial encounter for closed fracture: Secondary | ICD-10-CM | POA: Diagnosis not present

## 2020-03-05 DIAGNOSIS — M6281 Muscle weakness (generalized): Secondary | ICD-10-CM | POA: Diagnosis not present

## 2020-03-05 DIAGNOSIS — E872 Acidosis: Secondary | ICD-10-CM | POA: Diagnosis not present

## 2020-03-05 DIAGNOSIS — N390 Urinary tract infection, site not specified: Secondary | ICD-10-CM | POA: Diagnosis present

## 2020-03-05 DIAGNOSIS — Z7401 Bed confinement status: Secondary | ICD-10-CM | POA: Diagnosis not present

## 2020-03-05 DIAGNOSIS — Y92009 Unspecified place in unspecified non-institutional (private) residence as the place of occurrence of the external cause: Secondary | ICD-10-CM | POA: Diagnosis not present

## 2020-03-05 DIAGNOSIS — R52 Pain, unspecified: Secondary | ICD-10-CM | POA: Diagnosis not present

## 2020-03-05 DIAGNOSIS — E878 Other disorders of electrolyte and fluid balance, not elsewhere classified: Secondary | ICD-10-CM

## 2020-03-05 DIAGNOSIS — Z90711 Acquired absence of uterus with remaining cervical stump: Secondary | ICD-10-CM | POA: Diagnosis not present

## 2020-03-05 DIAGNOSIS — S2241XD Multiple fractures of ribs, right side, subsequent encounter for fracture with routine healing: Secondary | ICD-10-CM

## 2020-03-05 DIAGNOSIS — G250 Essential tremor: Secondary | ICD-10-CM | POA: Diagnosis present

## 2020-03-05 DIAGNOSIS — G8929 Other chronic pain: Secondary | ICD-10-CM | POA: Diagnosis present

## 2020-03-05 DIAGNOSIS — R2689 Other abnormalities of gait and mobility: Secondary | ICD-10-CM | POA: Diagnosis not present

## 2020-03-05 DIAGNOSIS — R1111 Vomiting without nausea: Secondary | ICD-10-CM | POA: Diagnosis not present

## 2020-03-05 DIAGNOSIS — R41841 Cognitive communication deficit: Secondary | ICD-10-CM | POA: Diagnosis not present

## 2020-03-05 DIAGNOSIS — B961 Klebsiella pneumoniae [K. pneumoniae] as the cause of diseases classified elsewhere: Secondary | ICD-10-CM | POA: Diagnosis present

## 2020-03-05 DIAGNOSIS — I340 Nonrheumatic mitral (valve) insufficiency: Secondary | ICD-10-CM | POA: Diagnosis present

## 2020-03-05 DIAGNOSIS — T502X5A Adverse effect of carbonic-anhydrase inhibitors, benzothiadiazides and other diuretics, initial encounter: Secondary | ICD-10-CM | POA: Diagnosis present

## 2020-03-05 DIAGNOSIS — W1830XA Fall on same level, unspecified, initial encounter: Secondary | ICD-10-CM | POA: Diagnosis present

## 2020-03-05 DIAGNOSIS — E1122 Type 2 diabetes mellitus with diabetic chronic kidney disease: Secondary | ICD-10-CM | POA: Diagnosis present

## 2020-03-05 DIAGNOSIS — Z6824 Body mass index (BMI) 24.0-24.9, adult: Secondary | ICD-10-CM | POA: Diagnosis not present

## 2020-03-05 DIAGNOSIS — Z79899 Other long term (current) drug therapy: Secondary | ICD-10-CM

## 2020-03-05 DIAGNOSIS — R0789 Other chest pain: Secondary | ICD-10-CM | POA: Diagnosis not present

## 2020-03-05 DIAGNOSIS — E119 Type 2 diabetes mellitus without complications: Secondary | ICD-10-CM | POA: Diagnosis not present

## 2020-03-05 DIAGNOSIS — I10 Essential (primary) hypertension: Secondary | ICD-10-CM | POA: Diagnosis present

## 2020-03-05 DIAGNOSIS — S4991XA Unspecified injury of right shoulder and upper arm, initial encounter: Secondary | ICD-10-CM | POA: Diagnosis not present

## 2020-03-05 DIAGNOSIS — Z823 Family history of stroke: Secondary | ICD-10-CM

## 2020-03-05 DIAGNOSIS — I129 Hypertensive chronic kidney disease with stage 1 through stage 4 chronic kidney disease, or unspecified chronic kidney disease: Secondary | ICD-10-CM | POA: Diagnosis present

## 2020-03-05 DIAGNOSIS — R54 Age-related physical debility: Secondary | ICD-10-CM | POA: Diagnosis present

## 2020-03-05 DIAGNOSIS — E1129 Type 2 diabetes mellitus with other diabetic kidney complication: Secondary | ICD-10-CM | POA: Diagnosis not present

## 2020-03-05 DIAGNOSIS — Z882 Allergy status to sulfonamides status: Secondary | ICD-10-CM

## 2020-03-05 DIAGNOSIS — E860 Dehydration: Secondary | ICD-10-CM | POA: Diagnosis present

## 2020-03-05 DIAGNOSIS — Z885 Allergy status to narcotic agent status: Secondary | ICD-10-CM | POA: Diagnosis not present

## 2020-03-05 DIAGNOSIS — E039 Hypothyroidism, unspecified: Secondary | ICD-10-CM | POA: Diagnosis not present

## 2020-03-05 DIAGNOSIS — E222 Syndrome of inappropriate secretion of antidiuretic hormone: Secondary | ICD-10-CM | POA: Diagnosis not present

## 2020-03-05 DIAGNOSIS — N179 Acute kidney failure, unspecified: Secondary | ICD-10-CM | POA: Diagnosis present

## 2020-03-05 DIAGNOSIS — M2550 Pain in unspecified joint: Secondary | ICD-10-CM | POA: Diagnosis not present

## 2020-03-05 DIAGNOSIS — N182 Chronic kidney disease, stage 2 (mild): Secondary | ICD-10-CM | POA: Diagnosis present

## 2020-03-05 DIAGNOSIS — J438 Other emphysema: Secondary | ICD-10-CM | POA: Diagnosis not present

## 2020-03-05 DIAGNOSIS — R55 Syncope and collapse: Secondary | ICD-10-CM | POA: Diagnosis not present

## 2020-03-05 DIAGNOSIS — J9811 Atelectasis: Secondary | ICD-10-CM | POA: Diagnosis not present

## 2020-03-05 DIAGNOSIS — R64 Cachexia: Secondary | ICD-10-CM | POA: Diagnosis present

## 2020-03-05 DIAGNOSIS — R2681 Unsteadiness on feet: Secondary | ICD-10-CM | POA: Diagnosis not present

## 2020-03-05 DIAGNOSIS — W19XXXA Unspecified fall, initial encounter: Secondary | ICD-10-CM

## 2020-03-05 DIAGNOSIS — M810 Age-related osteoporosis without current pathological fracture: Secondary | ICD-10-CM | POA: Diagnosis present

## 2020-03-05 DIAGNOSIS — N183 Chronic kidney disease, stage 3 unspecified: Secondary | ICD-10-CM | POA: Diagnosis not present

## 2020-03-05 DIAGNOSIS — E876 Hypokalemia: Secondary | ICD-10-CM | POA: Diagnosis not present

## 2020-03-05 DIAGNOSIS — Z833 Family history of diabetes mellitus: Secondary | ICD-10-CM

## 2020-03-05 DIAGNOSIS — E871 Hypo-osmolality and hyponatremia: Secondary | ICD-10-CM | POA: Diagnosis not present

## 2020-03-05 DIAGNOSIS — R112 Nausea with vomiting, unspecified: Secondary | ICD-10-CM | POA: Diagnosis not present

## 2020-03-05 DIAGNOSIS — R110 Nausea: Secondary | ICD-10-CM | POA: Diagnosis not present

## 2020-03-05 DIAGNOSIS — F419 Anxiety disorder, unspecified: Secondary | ICD-10-CM | POA: Diagnosis present

## 2020-03-05 DIAGNOSIS — I517 Cardiomegaly: Secondary | ICD-10-CM | POA: Diagnosis not present

## 2020-03-05 DIAGNOSIS — Z9049 Acquired absence of other specified parts of digestive tract: Secondary | ICD-10-CM

## 2020-03-05 DIAGNOSIS — R569 Unspecified convulsions: Secondary | ICD-10-CM | POA: Diagnosis not present

## 2020-03-05 DIAGNOSIS — Z9181 History of falling: Secondary | ICD-10-CM | POA: Diagnosis not present

## 2020-03-05 DIAGNOSIS — S43004D Unspecified dislocation of right shoulder joint, subsequent encounter: Secondary | ICD-10-CM | POA: Diagnosis not present

## 2020-03-05 DIAGNOSIS — Z20822 Contact with and (suspected) exposure to covid-19: Secondary | ICD-10-CM | POA: Diagnosis not present

## 2020-03-05 DIAGNOSIS — Z803 Family history of malignant neoplasm of breast: Secondary | ICD-10-CM

## 2020-03-05 DIAGNOSIS — R5381 Other malaise: Secondary | ICD-10-CM | POA: Diagnosis not present

## 2020-03-05 DIAGNOSIS — S43004A Unspecified dislocation of right shoulder joint, initial encounter: Secondary | ICD-10-CM | POA: Diagnosis present

## 2020-03-05 DIAGNOSIS — J069 Acute upper respiratory infection, unspecified: Secondary | ICD-10-CM | POA: Diagnosis not present

## 2020-03-05 DIAGNOSIS — R197 Diarrhea, unspecified: Secondary | ICD-10-CM | POA: Diagnosis not present

## 2020-03-05 LAB — CBC
HCT: 38.3 % (ref 36.0–46.0)
Hemoglobin: 13.7 g/dL (ref 12.0–15.0)
MCH: 30.4 pg (ref 26.0–34.0)
MCHC: 35.8 g/dL (ref 30.0–36.0)
MCV: 84.9 fL (ref 80.0–100.0)
Platelets: 256 10*3/uL (ref 150–400)
RBC: 4.51 MIL/uL (ref 3.87–5.11)
RDW: 13.2 % (ref 11.5–15.5)
WBC: 16.6 10*3/uL — ABNORMAL HIGH (ref 4.0–10.5)
nRBC: 0 % (ref 0.0–0.2)

## 2020-03-05 LAB — SODIUM
Sodium: 113 mmol/L — CL (ref 135–145)
Sodium: 111 mmol/L — CL (ref 135–145)

## 2020-03-05 LAB — URINALYSIS, ROUTINE W REFLEX MICROSCOPIC
Bilirubin Urine: NEGATIVE
Glucose, UA: NEGATIVE mg/dL
Ketones, ur: NEGATIVE mg/dL
Nitrite: POSITIVE — AB
Protein, ur: NEGATIVE mg/dL
Specific Gravity, Urine: 1.01 (ref 1.005–1.030)
WBC, UA: >50 WBC/hpf — ABNORMAL HIGH (ref 0–5)
pH: 5 (ref 5.0–8.0)

## 2020-03-05 LAB — OSMOLALITY, URINE: Osmolality, Ur: 337 mOsm/kg (ref 300–900)

## 2020-03-05 LAB — COMPREHENSIVE METABOLIC PANEL
ALT: 35 U/L (ref 0–44)
AST: 74 U/L — ABNORMAL HIGH (ref 15–41)
Albumin: 4.3 g/dL (ref 3.5–5.0)
Alkaline Phosphatase: 64 U/L (ref 38–126)
Anion gap: 15 (ref 5–15)
BUN: 13 mg/dL (ref 8–23)
CO2: 18 mmol/L — ABNORMAL LOW (ref 22–32)
Calcium: 9 mg/dL (ref 8.9–10.3)
Chloride: 78 mmol/L — ABNORMAL LOW (ref 98–111)
Creatinine, Ser: 1.26 mg/dL — ABNORMAL HIGH (ref 0.44–1.00)
GFR calc Af Amer: 46 mL/min — ABNORMAL LOW (ref >60)
GFR calc non Af Amer: 40 mL/min — ABNORMAL LOW (ref >60)
Glucose, Bld: 136 mg/dL — ABNORMAL HIGH (ref 70–99)
Potassium: 3.6 mmol/L (ref 3.5–5.1)
Sodium: 111 mmol/L — CL (ref 135–145)
Total Bilirubin: 1.3 mg/dL — ABNORMAL HIGH (ref 0.3–1.2)
Total Protein: 8.3 g/dL — ABNORMAL HIGH (ref 6.5–8.1)

## 2020-03-05 LAB — SODIUM, URINE, RANDOM: Sodium, Ur: 33 mmol/L

## 2020-03-05 LAB — SARS CORONAVIRUS 2 BY RT PCR (HOSPITAL ORDER, PERFORMED IN ~~LOC~~ HOSPITAL LAB): SARS Coronavirus 2: NEGATIVE

## 2020-03-05 LAB — LIPASE, BLOOD: Lipase: 31 U/L (ref 11–51)

## 2020-03-05 LAB — TSH: TSH: 8.611 u[IU]/mL — ABNORMAL HIGH (ref 0.350–4.500)

## 2020-03-05 MED ORDER — MORPHINE SULFATE (PF) 4 MG/ML IV SOLN
4.0000 mg | Freq: Once | INTRAVENOUS | Status: AC
  Administered 2020-03-05: 4 mg via INTRAVENOUS
  Filled 2020-03-05: qty 1

## 2020-03-05 MED ORDER — ONDANSETRON HCL 4 MG/2ML IJ SOLN
4.0000 mg | Freq: Once | INTRAMUSCULAR | Status: AC
  Administered 2020-03-05: 4 mg via INTRAVENOUS
  Filled 2020-03-05: qty 2

## 2020-03-05 MED ORDER — AMLODIPINE BESYLATE 5 MG PO TABS
5.0000 mg | ORAL_TABLET | Freq: Every day | ORAL | Status: DC
  Administered 2020-03-05 – 2020-03-07 (×3): 5 mg via ORAL
  Filled 2020-03-05 (×3): qty 1

## 2020-03-05 MED ORDER — BISACODYL 10 MG RE SUPP: 10.0000 mg | Freq: Every day | RECTAL | Status: DC | PRN

## 2020-03-05 MED ORDER — SENNOSIDES-DOCUSATE SODIUM 8.6-50 MG PO TABS: 1.0000 | ORAL_TABLET | Freq: Every evening | ORAL | Status: DC | PRN

## 2020-03-05 MED ORDER — FENTANYL CITRATE (PF) 100 MCG/2ML IJ SOLN
12.5000 ug | Freq: Once | INTRAMUSCULAR | Status: AC
  Administered 2020-03-05: 12.5 ug via INTRAVENOUS
  Filled 2020-03-05: qty 2

## 2020-03-05 MED ORDER — HYDROMORPHONE HCL 1 MG/ML IJ SOLN
0.5000 mg | INTRAMUSCULAR | Status: DC | PRN
  Administered 2020-03-05 – 2020-03-09 (×3): 0.5 mg via INTRAVENOUS
  Filled 2020-03-05 (×2): qty 1
  Filled 2020-03-05: qty 0.5

## 2020-03-05 MED ORDER — OXYCODONE HCL 5 MG PO TABS
5.0000 mg | ORAL_TABLET | ORAL | Status: DC | PRN
  Administered 2020-03-06 – 2020-03-14 (×11): 5 mg via ORAL
  Filled 2020-03-05 (×12): qty 1

## 2020-03-05 MED ORDER — DOCUSATE SODIUM 100 MG PO CAPS: 100.0000 mg | ORAL_CAPSULE | Freq: Two times a day (BID) | ORAL | Status: DC

## 2020-03-05 MED ORDER — LOTEPREDNOL ETABONATE 0.38 % OP GEL: 1.0000 [drp] | Freq: Two times a day (BID) | OPHTHALMIC | Status: DC

## 2020-03-05 MED ORDER — ONDANSETRON HCL 4 MG/2ML IJ SOLN
4.0000 mg | Freq: Four times a day (QID) | INTRAMUSCULAR | Status: DC | PRN
  Administered 2020-03-05 – 2020-03-11 (×5): 4 mg via INTRAVENOUS
  Filled 2020-03-05 (×5): qty 2

## 2020-03-05 MED ORDER — ONDANSETRON HCL 4 MG PO TABS
4.0000 mg | ORAL_TABLET | Freq: Four times a day (QID) | ORAL | Status: DC | PRN
  Administered 2020-03-06: 4 mg via ORAL
  Filled 2020-03-05: qty 1

## 2020-03-05 MED ORDER — LEVOTHYROXINE SODIUM 50 MCG PO TABS
50.0000 ug | ORAL_TABLET | Freq: Every day | ORAL | Status: DC
  Administered 2020-03-06 – 2020-03-14 (×9): 50 ug via ORAL
  Filled 2020-03-05 (×9): qty 1

## 2020-03-05 MED ORDER — LABETALOL HCL 5 MG/ML IV SOLN
10.0000 mg | INTRAVENOUS | Status: DC | PRN
  Administered 2020-03-07: 10 mg via INTRAVENOUS
  Filled 2020-03-05 (×2): qty 4

## 2020-03-05 MED ORDER — CYCLOSPORINE 0.05 % OP EMUL
1.0000 [drp] | Freq: Two times a day (BID) | OPHTHALMIC | Status: DC
  Administered 2020-03-05 – 2020-03-14 (×18): 1 [drp] via OPHTHALMIC
  Filled 2020-03-05 (×19): qty 1

## 2020-03-05 MED ORDER — SODIUM CHLORIDE 0.9 % IV SOLN: INTRAVENOUS | Status: DC

## 2020-03-05 MED ORDER — PROMETHAZINE HCL 25 MG/ML IJ SOLN
6.2500 mg | Freq: Once | INTRAMUSCULAR | Status: AC
  Administered 2020-03-05: 6.25 mg via INTRAVENOUS
  Filled 2020-03-05: qty 1

## 2020-03-05 MED ORDER — TRAZODONE HCL 50 MG PO TABS
25.0000 mg | ORAL_TABLET | Freq: Every evening | ORAL | Status: DC | PRN
  Administered 2020-03-06 – 2020-03-10 (×3): 25 mg via ORAL
  Filled 2020-03-05 (×5): qty 1

## 2020-03-05 MED ORDER — SODIUM CHLORIDE 0.9 % IV BOLUS
1000.0000 mL | Freq: Once | INTRAVENOUS | Status: AC
  Administered 2020-03-05: 1000 mL via INTRAVENOUS

## 2020-03-05 MED ORDER — ENOXAPARIN SODIUM 40 MG/0.4ML ~~LOC~~ SOLN
40.0000 mg | SUBCUTANEOUS | Status: DC
  Administered 2020-03-05: 40 mg via SUBCUTANEOUS
  Filled 2020-03-05: qty 0.4

## 2020-03-05 MED ORDER — SODIUM BICARBONATE 650 MG PO TABS
650.0000 mg | ORAL_TABLET | Freq: Once | ORAL | Status: AC
  Administered 2020-03-05: 650 mg via ORAL
  Filled 2020-03-05: qty 1

## 2020-03-05 MED ORDER — ACETAMINOPHEN 500 MG PO TABS
1000.0000 mg | ORAL_TABLET | Freq: Three times a day (TID) | ORAL | Status: DC
  Administered 2020-03-05 – 2020-03-14 (×26): 1000 mg via ORAL
  Filled 2020-03-05 (×28): qty 2

## 2020-03-05 NOTE — ED Notes (Signed)
Patient placed on pure wick and encouraged to void.

## 2020-03-05 NOTE — Telephone Encounter (Signed)
If chest pain present, she needs to be evaluated in the ED. If no chest pain, she needs to call office and schedule video appt

## 2020-03-05 NOTE — ED Triage Notes (Addendum)
Arrives via EMS from home, C/C nausea and emesis x2 days, saw PCP and they did a rapid covid, it was negative, still waiting on PCR covid results. Multiple episodes of emesis,. EMS reports bile-colored. Spit up in triage, EMS gave 4mg  Zofran IV, 22 G LFA. Reports falling this AM, family found her on the floor, reports R shoulder pain, denies hitting head.

## 2020-03-05 NOTE — Telephone Encounter (Signed)
Patient seen in ED after fall.

## 2020-03-05 NOTE — ED Notes (Addendum)
ED TO INPATIENT HANDOFF REPORT  ED Nurse Name and Phone #: Fredonia Highland [phone]  S Name/Age/Gender Christy Wade 84 y.o. female Room/Bed: WA23/WA23  Code Status   Code Status: Full Code  Home/SNF/Other Home Patient oriented to: self, place, time and situation Is this baseline? Yes   Triage Complete: Triage complete  Chief Complaint Hyponatremia [E87.1]  Triage Note Arrives via EMS from home, C/C nausea and emesis x2 days, saw PCP and they did a rapid covid, it was negative, still waiting on PCR covid results. Multiple episodes of emesis,. EMS reports bile-colored. Spit up in triage, EMS gave 4mg  Zofran IV, 22 G LFA. Reports falling this AM, family found her on the floor, reports R shoulder pain, denies hitting head.     Allergies Allergies  Allergen Reactions  . Codeine Nausea Only    REACTION: nausea  . Sulfamethoxazole Rash    REACTION: rash    Level of Care/Admitting Diagnosis ED Disposition    ED Disposition Condition Comment   Admit  Hospital Area: [facility] [[id number]]  Level of Care: Telemetry [5]  Admit to tele based on following criteria: Other see comments  Comments: Hyponatremia  May admit patient to [facility] or [facility] if equivalent level of care is available:: Yes  Covid Evaluation: Asymptomatic Screening Protocol (No Symptoms)  Diagnosis: Hyponatremia [518841]  Admitting Physician: Mercy Riding [[id number]]  Attending Physician: Mercy Riding [[id number]]  Estimated length of stay: past midnight tomorrow  Certification:: I certify this patient will need inpatient services for at least 2 midnights       B Medical/Surgery History Past Medical History:  Diagnosis Date  . Anemia    on iron now  . Cataract   . Hypertension   . Hypothyroidism   . Osteoporosis   . PONV (postoperative nausea and vomiting)   . Thyroid disease   . Varicose vein    Past Surgical History:  Procedure Laterality Date  . ABDOMINAL HYSTERECTOMY   1975   partial  . APPENDECTOMY  1975   with hysterectomy  . HEMORROIDECTOMY     [id number]  . LAPAROSCOPIC SMALL BOWEL RESECTION N/A 11/29/2014   Procedure: LAPAROSCOPIC SMALL INTESTINE RESECTION;  Surgeon: Michael Boston, MD;  Location: [facility];  Service: General;  Laterality: N/A;  . VARICOSE VEIN SURGERY Left 1971     A IV Location/Drains/Wounds Patient Lines/Drains/Airways Status    Active Line/Drains/Airways    Name Placement date Placement time Site Days   Peripheral IV 03/05/20 Anterior;Left;Proximal Forearm 03/05/20  --  Forearm  less than 1   Incision (Closed) 11/29/14 Abdomen 11/29/14  1534   1923   Incision - 3 Ports Abdomen 1: Left;Upper 2: Left;Mid 3: Lower 11/29/14  1533   1923   Wound / Incision (Open or Dehisced) 12/01/14 Other (Comment) Sacrum Mid;Other (Comment) pink broken area at coccyx level between upper buttocks 12/01/14  1540  Sacrum  1921          Intake/Output Last 24 hours  Intake/Output Summary (Last 24 hours) at 03/05/2020 1905 Last data filed at 03/05/2020 1904 Gross per 24 hour  Intake 999 ml  Output 1600 ml  Net -601 ml    Labs/Imaging Results for orders placed or performed during the hospital encounter of 03/05/20 (from the past 48 hour(s))  Lipase, blood     Status: None   Collection Time: 03/05/20  9:13 AM  Result Value Ref Range   Lipase 31 11 - 51 U/L  Comment: Performed at [facility], [place] [address] [place], [place] [address]  Comprehensive metabolic panel     Status: Abnormal   Collection Time: 03/05/20  9:13 AM  Result Value Ref Range   Sodium 111 (LL) 135 - 145 mmol/L    Comment: CRITICAL RESULT CALLED TO, READ BACK BY AND VERIFIED WITH: SMITH,T @ 09:57 03/05/20 BY BOWMAN,K     Potassium 3.6 3.5 - 5.1 mmol/L   Chloride 78 (L) 98 - 111 mmol/L   CO2 18 (L) 22 - 32 mmol/L   Glucose, Bld 136 (H) 70 - 99 mg/dL    Comment: Glucose reference range applies only to samples taken after fasting for at least 8  hours.   BUN 13 8 - 23 mg/dL   Creatinine, Ser 1.26 (H) 0.44 - 1.00 mg/dL   Calcium 9.0 8.9 - 10.3 mg/dL   Total Protein 8.3 (H) 6.5 - 8.1 g/dL   Albumin 4.3 3.5 - 5.0 g/dL   AST 74 (H) 15 - 41 U/L   ALT 35 0 - 44 U/L   Alkaline Phosphatase 64 38 - 126 U/L   Total Bilirubin 1.3 (H) 0.3 - 1.2 mg/dL   GFR calc non Af Amer 40 (L) >60 mL/min   GFR calc Af Amer 46 (L) >60 mL/min   Anion gap 15 5 - 15    Comment: Performed at [facility], [place] [address], [place], [place] [address]  CBC     Status: Abnormal   Collection Time: 03/05/20  9:13 AM  Result Value Ref Range   WBC 16.6 (H) 4.0 - 10.5 K/uL   RBC 4.51 3.87 - 5.11 MIL/uL   Hemoglobin 13.7 12.0 - 15.0 g/dL   HCT 38.3 36 - 46 %   MCV 84.9 80.0 - 100.0 fL   MCH 30.4 26.0 - 34.0 pg   MCHC 35.8 30.0 - 36.0 g/dL   RDW 13.2 11.5 - 15.5 %   Platelets 256 150 - 400 K/uL   nRBC 0.0 0.0 - 0.2 %    Comment: Performed at [facility], [place] [address] [place], [place] [address]  SARS Coronavirus 2 by RT PCR (hospital order, performed in [facility] hospital lab) Nasopharyngeal Nasopharyngeal Swab     Status: None   Collection Time: 03/05/20 12:06 PM   Specimen: Nasopharyngeal Swab  Result Value Ref Range   SARS Coronavirus 2 NEGATIVE NEGATIVE    Comment: (NOTE) SARS-CoV-2 target nucleic acids are NOT DETECTED.  The SARS-CoV-2 RNA is generally detectable in upper and lower respiratory specimens during the acute phase of infection. The lowest concentration of SARS-CoV-2 viral copies this assay can detect is 250 copies / mL. A negative result does not preclude SARS-CoV-2 infection and should not be used as the sole basis for treatment or other patient management decisions.  A negative result may occur with improper specimen collection / handling, submission of specimen other than nasopharyngeal swab, presence of viral mutation(s) within the areas targeted by this assay, and inadequate number of viral  copies (<250 copies / mL). A negative result must be combined with clinical observations, patient history, and epidemiological information.  Fact Sheet for Patients:   StrictlyIdeas.no  Fact Sheet for Healthcare Providers: BankingDealers.co.za  This test is not yet approved or  cleared by the [place] FDA and has been authorized for detection and/or diagnosis of SARS-CoV-2 by FDA under an Emergency Use Authorization (EUA).  This EUA will remain in effect (meaning this  test can be used) for the duration of the COVID-19 declaration under Section 564(b)(1) of the Act, 21 U.S.C. section 360bbb-3(b)(1), unless the authorization is terminated or revoked sooner.  Performed at [facility], [place] [address], [place], [place] [address]   Sodium     Status: Abnormal   Collection Time: 03/05/20  2:54 PM  Result Value Ref Range   Sodium 113 (LL) 135 - 145 mmol/L    Comment: CRITICAL RESULT CALLED TO, READ BACK BY AND VERIFIED WITH: M MCIVER AT 9024 ON 03/05/2020 BY MOSLEY,J Performed at [facility], [place] [address], [place], [place] [address]   TSH     Status: Abnormal   Collection Time: 03/05/20  4:02 PM  Result Value Ref Range   TSH 8.611 (H) 0.350 - 4.500 uIU/mL    Comment: Performed by a 3rd Generation assay with a functional sensitivity of <=0.01 uIU/mL. Performed at [facility], [place] [address] [place], [place] [address]    DG Shoulder Right  Result Date: 03/05/2020 CLINICAL DATA:  Fall with shoulder pain EXAM: RIGHT SHOULDER - 2+ VIEW COMPARISON:  None. FINDINGS: Posterior right seventh and eighth rib fractures. No visible pneumothorax but there is soft tissue gas on the lateral view and marked displacement of 1 of the fractures. Corticated defect in the acromion. Offset at the acromioclavicular joint by at least 50%. No fracture deformity. No coracoclavicular interval widening  IMPRESSION: 1. AC joint offset suggesting separated shoulder. 2. Right rib fractures involving the seventh and eighth ribs at least. One of these fractures is markedly displaced and there is adjacent chest wall emphysema. No visible pneumothorax. 3. Os acromiale. Electronically Signed   By: Monte Fantasia M.D.   On: 03/05/2020 09:48   DG Chest Portable 1 View  Result Date: 03/05/2020 CLINICAL DATA:  Fall with rib fractures on shoulder radiograph EXAM: PORTABLE CHEST 1 VIEW COMPARISON:  11/10/2005 FINDINGS: Right seventh, eighth, and ninth rib fractures with prominent displacement. Streaky opacity at the right base. No visible effusion or pneumothorax. Mild cardiomegaly. Aortic tortuosity. Shoulder findings as on dedicated study IMPRESSION: 1. Right seventh through ninth rib fractures with prominent displacement. 2. Atelectasis at the right base.  No visible pneumothorax. Electronically Signed   By: Monte Fantasia M.D.   On: 03/05/2020 11:54    Pending Labs Unresulted Labs (From admission, onward) Comment          Start     Ordered   03/06/20 4268  Basic metabolic panel  Tomorrow morning,   R        03/05/20 1354   03/06/20 0500  CBC  Tomorrow morning,   R        03/05/20 1354   03/05/20 1305  Sodium  Now then every 6 hours,   R (with STAT occurrences)      03/05/20 1304   03/05/20 1304  Sodium, urine, random  Once,   STAT        03/05/20 1303   03/05/20 1051  Osmolality, urine  Once,   STAT        03/05/20 1051   03/05/20 0852  Urinalysis, Routine w reflex microscopic Urine, Catheterized  ONCE - STAT,   STAT        03/05/20 0851          Vitals/Pain Today's Vitals   03/05/20 1626 03/05/20 1700 03/05/20 1749 03/05/20 1822  BP: (!) 161/81 (!) 159/76 (!) 150/74 138/65  Pulse: 87 88 85 84  Resp: 18 18 16 18   Temp:      TempSrc:      SpO2: 100% 96% 96% 96%  Weight:      Height:      PainSc:        Isolation Precautions No active isolations  Medications Medications   senna-docusate (Senokot-S) tablet 1 tablet (has no administration in time range)  ondansetron (ZOFRAN) tablet 4 mg (has no administration in time range)    Or  ondansetron (ZOFRAN) injection 4 mg (has no administration in time range)  enoxaparin (LOVENOX) injection 40 mg (has no administration in time range)  oxyCODONE (Oxy IR/ROXICODONE) immediate release tablet 5 mg (has no administration in time range)  HYDROmorphone (DILAUDID) injection 0.5 mg (has no administration in time range)  traZODone (DESYREL) tablet 25 mg (has no administration in time range)  acetaminophen (TYLENOL) tablet 1,000 mg (1,000 mg Oral Given 03/05/20 1446)  0.9 %  sodium chloride infusion (has no administration in time range)  amLODipine (NORVASC) tablet 5 mg (5 mg Oral Given 03/05/20 1446)  labetalol (NORMODYNE) injection 10 mg (has no administration in time range)  levothyroxine (SYNTHROID) tablet 50 mcg (has no administration in time range)  cycloSPORINE (RESTASIS) 0.05 % ophthalmic emulsion 1 drop (has no administration in time range)  Loteprednol Etabonate 0.38 % GEL 1 drop (has no administration in time range)  ondansetron (ZOFRAN) injection 4 mg (4 mg Intravenous Given 03/05/20 1202)  fentaNYL (SUBLIMAZE) injection 12.5 mcg (12.5 mcg Intravenous Given 03/05/20 1202)  sodium chloride 0.9 % bolus 1,000 mL (0 mLs Intravenous Stopped 03/05/20 1449)  promethazine (PHENERGAN) injection 6.25 mg (6.25 mg Intravenous Given 03/05/20 1306)  morphine 4 MG/ML injection 4 mg (4 mg Intravenous Given 03/05/20 1308)    Mobility walks with person assist High fall risk   Focused Assessments GEN Med   R Recommendations: See Admitting Provider Note  Report given to: called to try to give report no answer  Additional Notes:

## 2020-03-05 NOTE — Plan of Care (Signed)

## 2020-03-05 NOTE — ED Provider Notes (Signed)
[facility] DEPT Provider Note   CSN: [id number] Arrival date & time: 03/05/20  8921     History Chief Complaint  Patient presents with  . Nausea  . Fall  . Shoulder Pain    Christy Wade is a 84 y.o. female with past medical history significant for anemia, hypertension, osteoporosis, thyroid disease, mitral valve regurgitation.  Not on anticoagulation.  Patient received Covid vaccinations. Abdominal surgical history includes appendectomy and laparoscopic small bowel resection.  HPI Presents to emergency department today via EMS with chief complaint of nausea and emesis x2 days.  Patient states she has been very sick on her stomach and unable to tolerate any p.o. intake.  She has had too many episodes of emesis to count prior to arrival.  She states her emesis is yellow in color.  She denies seeing any blood in it.  Patient is also endorsing a mechanical fall just prior to arrival.  She states this morning she had a mechanical fall.  She was trying to walk to the bathroom when she fell.  She landed on her right shoulder.  She denies hitting her head or loss of consciousness.  Patient states she is having sharp pain in her right shoulder.  Patient states she was on the ground for approximately 30 minutes.  She lives alone and family checks on her frequently.  She does admit to having an episode of nonbloody diarrhea x4 days ago. pain radiates down her right arm and is constant.  She rates the pain 7 of 10 in severity.  She was given Zofran by EMS in route.  She denies any fever, chills, cough, chest pain, shortness of breath, abdominal pain, urinary frequency, dysuria, gross hematuria, lower extremity edema.  Patient went to urgent care x3 days ago.  Daughter at the bedside states her urine sample had blood and is unsure if there was infection.  Patient denies any urinary symptoms currently. She also had a recent medication change.  She was taking lisinopril for  her blood pressure however it was changed to lisinopril HCTZ.  She started the new prescription on 02/29/2020.     Past Medical History:  Diagnosis Date  . Anemia    on iron now  . Cataract   . Hypertension   . Hypothyroidism   . Osteoporosis   . PONV (postoperative nausea and vomiting)   . Thyroid disease   . Varicose vein     Patient Active Problem List   Diagnosis Date Noted  . Diarrhea 07/24/2018  . Vaginitis and vulvovaginitis 05/22/2016  . Well adult exam 10/01/2015  . Chronic chest wall pain 09/09/2014  . Mitral valve regurgitation 06/21/2012  . Essential hypertension 01/03/2008  . Osteopenia 03/28/2007  . Hypothyroidism 02/16/2007    Past Surgical History:  Procedure Laterality Date  . ABDOMINAL HYSTERECTOMY  1975   partial  . APPENDECTOMY  1975   with hysterectomy  . HEMORROIDECTOMY     [phone]  . LAPAROSCOPIC SMALL BOWEL RESECTION N/A 11/29/2014   Procedure: LAPAROSCOPIC SMALL INTESTINE RESECTION;  Surgeon: Michael Boston, MD;  Location: [facility];  Service: General;  Laterality: N/A;  . VARICOSE VEIN SURGERY Left 1971     OB History   No obstetric history on file.     Family History  Problem Relation Age of Onset  . Bone cancer Mother   . Diabetes Father   . Stroke Father   . Breast cancer Sister   . Stomach cancer Neg Hx   .  Colon cancer Neg Hx     Social History   Tobacco Use  . Smoking status: Never Smoker  . Smokeless tobacco: Never Used  Vaping Use  . Vaping Use: Never used  Substance Use Topics  . Alcohol use: No    Alcohol/week: 0.0 standard drinks  . Drug use: No    Home Medications Prior to Admission medications   Medication Sig Start Date End Date Taking? Authorizing Provider  CALCIUM-VITAMIN D PO Take 1 tablet by mouth daily after lunch.    [provider]  cycloSPORINE (RESTASIS) 0.05 % ophthalmic emulsion 1 drop 2 (two) times daily.    [provider]  levothyroxine (SYNTHROID) 50 MCG tablet TAKE 1  TABLET BY MOUTH EVERY MORNING 08/13/19   Luetta Nutting, DO  lisinopril-hydrochlorothiazide (ZESTORETIC) 20-25 MG tablet Take 1 tablet by mouth daily. 12/20/19   CiriglianoGarvin Fila, DO  Multiple Vitamin (MULTIVITAMIN) tablet Take 1 tablet by mouth daily with lunch.     [provider]  traMADol (ULTRAM) 50 MG tablet TAKE 1 TABLET(50 MG) BY MOUTH EVERY 8 HOURS AS NEEDED 11/15/18   Luetta Nutting, DO  vitamin B-12 (CYANOCOBALAMIN) 500 MCG tablet Take 1,000 mcg by mouth daily.    [provider]    Allergies    Codeine and Sulfamethoxazole  Review of Systems   Review of Systems  All other systems are reviewed and are negative for acute change except as noted in the HPI.   Physical Exam Updated Vital Signs BP (!) 140/93 (BP Location: Right Arm)   Pulse 79   Temp 98.2 F (36.8 C) (Oral)   Resp 20   Ht 5\' 7"  (1.702 m)   Wt 68 kg   SpO2 95%   BMI 23.49 kg/m   Physical Exam Vitals and nursing note reviewed.  Constitutional:      General: She is not in acute distress.    Appearance: She is ill-appearing. She is not toxic-appearing.  HENT:     Head: Normocephalic and atraumatic. No raccoon eyes or Battle's sign.     Jaw: There is normal jaw occlusion.     Right Ear: Tympanic membrane and external ear normal. No hemotympanum.     Left Ear: Tympanic membrane and external ear normal. No hemotympanum.     Nose: Nose normal.     Right Nostril: No epistaxis or septal hematoma.     Left Nostril: No epistaxis or septal hematoma.     Mouth/Throat:     Mouth: Mucous membranes are dry.     Pharynx: Oropharynx is clear.  Eyes:     General: No scleral icterus.       Right eye: No discharge.        Left eye: No discharge.     Extraocular Movements: Extraocular movements intact.     Conjunctiva/sclera: Conjunctivae normal.     Pupils: Pupils are equal, round, and reactive to light.  Neck:     Vascular: No JVD.     Comments: Full ROM intact without spinous process TTP. No  bony stepoffs or deformities, no paraspinous muscle TTP or muscle spasms. No rigidity or meningeal signs. No bruising, erythema, or swelling.  Cardiovascular:     Rate and Rhythm: Normal rate and regular rhythm.     Pulses: Normal pulses.          Radial pulses are 2+ on the right side and 2+ on the left side.     Heart sounds: Normal heart sounds.  Pulmonary:  Comments: Lungs clear to auscultation in all fields. Symmetric chest rise. No wheezing, rales, or rhonchi.  Oxygen saturation is 95% on room air.  Patient is speaking in full sentences.  No respiratory distress. Chest:       Comments: Tenderness to palpation as depicted in image above.  No crepitus. Abdominal:     Comments: Abdomen is soft, non-distended, and non-tender in all quadrants. No rigidity, no guarding. No peritoneal signs.  Musculoskeletal:        General: Normal range of motion.     Cervical back: Normal range of motion.     Comments: Bony tenderness to palpation of right shoulder.  Full range of motion of right shoulder, elbow and wrist.  Radial pulse 2+ bilaterally.  Strong and equal grip strength in bilateral upper extremities.  Compartments in right upper extremity are soft.  She is neurovascularly intact distally.  Skin:    General: Skin is warm and dry.     Capillary Refill: Capillary refill takes less than 2 seconds.  Neurological:     Mental Status: She is oriented to person, place, and time.     GCS: GCS eye subscore is 4. GCS verbal subscore is 5. GCS motor subscore is 6.     Comments: Fluent speech, no facial droop.  Psychiatric:        Behavior: Behavior normal.     ED Results / Procedures / Treatments   Labs (all labs ordered are listed, but only abnormal results are displayed) Labs Reviewed  COMPREHENSIVE METABOLIC PANEL - Abnormal; Notable for the following components:      Result Value   Sodium 111 (*)    Chloride 78 (*)    CO2 18 (*)    Glucose, Bld 136 (*)    Creatinine, Ser 1.26 (*)      Total Protein 8.3 (*)    AST 74 (*)    Total Bilirubin 1.3 (*)    GFR calc non Af Amer 40 (*)    GFR calc Af Amer 46 (*)    All other components within normal limits  CBC - Abnormal; Notable for the following components:   WBC 16.6 (*)    All other components within normal limits  SARS CORONAVIRUS 2 BY RT PCR (HOSPITAL ORDER, [place])  LIPASE, BLOOD  URINALYSIS, ROUTINE W REFLEX MICROSCOPIC  OSMOLALITY, URINE    EKG None  Radiology DG Shoulder Right  Result Date: 03/05/2020 CLINICAL DATA:  Fall with shoulder pain EXAM: RIGHT SHOULDER - 2+ VIEW COMPARISON:  None. FINDINGS: Posterior right seventh and eighth rib fractures. No visible pneumothorax but there is soft tissue gas on the lateral view and marked displacement of 1 of the fractures. Corticated defect in the acromion. Offset at the acromioclavicular joint by at least 50%. No fracture deformity. No coracoclavicular interval widening IMPRESSION: 1. AC joint offset suggesting separated shoulder. 2. Right rib fractures involving the seventh and eighth ribs at least. One of these fractures is markedly displaced and there is adjacent chest wall emphysema. No visible pneumothorax. 3. Os acromiale. Electronically Signed   By: Monte Fantasia M.D.   On: 03/05/2020 09:48   DG Chest Portable 1 View  Result Date: 03/05/2020 CLINICAL DATA:  Fall with rib fractures on shoulder radiograph EXAM: PORTABLE CHEST 1 VIEW COMPARISON:  11/10/2005 FINDINGS: Right seventh, eighth, and ninth rib fractures with prominent displacement. Streaky opacity at the right base. No visible effusion or pneumothorax. Mild cardiomegaly. Aortic tortuosity. Shoulder findings  as on dedicated study IMPRESSION: 1. Right seventh through ninth rib fractures with prominent displacement. 2. Atelectasis at the right base.  No visible pneumothorax. Electronically Signed   By: Monte Fantasia M.D.   On: 03/05/2020 11:54    Procedures .Critical  Care Performed by: Cherre Robins, PA-C Authorized by: Cherre Robins, PA-C   Critical care provider statement:    Critical care time (minutes):  33   Critical care time was exclusive of:  Separately billable procedures and treating other patients and teaching time   Critical care was necessary to treat or prevent imminent or life-threatening deterioration of the following conditions:  Metabolic crisis   Critical care was time spent personally by me on the following activities:  Development of treatment plan with patient or surrogate, evaluation of patient's response to treatment, examination of patient, obtaining history from patient or surrogate, ordering and performing treatments and interventions, ordering and review of laboratory studies, ordering and review of radiographic studies, pulse oximetry, re-evaluation of patient's condition and review of old charts   (including critical care time)  Medications Ordered in ED Medications  promethazine (PHENERGAN) injection 6.25 mg (has no administration in time range)  morphine 4 MG/ML injection 4 mg (has no administration in time range)  ondansetron (ZOFRAN) injection 4 mg (4 mg Intravenous Given 03/05/20 1202)  fentaNYL (SUBLIMAZE) injection 12.5 mcg (12.5 mcg Intravenous Given 03/05/20 1202)  sodium chloride 0.9 % bolus 1,000 mL (1,000 mLs Intravenous New Bag/Given 03/05/20 1201)    ED Course  I have reviewed the triage vital signs and the nursing notes.  Pertinent labs & imaging results that were available during my care of the patient were reviewed by me and considered in my medical decision making (see chart for details).  Clinical Course as of Mar 05 1301  Wed Mar 05, 2020  1106 1 L NS ordered at 217ml/hr  Sodium(!!): 111 [KA]    Clinical Course User Index [KA] Lyndsy Gilberto, Harley Hallmark, PA-C   MDM Rules/Calculators/A&P                          History provided by patient with additional history obtained from chart review.     Patient seen and examined. Patient presents awake, alert, hemodynamically stable, afebrile, non toxic.  Patient looks to not feel well and is ill-appearing however is nontoxic. No hypoxia or tachypnea, no respiratory distress.  Mucous membranes are dry.  She has bony tenderness to palpation of right shoulder, neurovascular intact distally.  She does have tenderness palpation of right chest.  No deformity or flail chest, crepitus. She is neurologically intact, no signs of serious head or neck injury. Abdomen without tenderness, no peritoneal signs.   Labs were ordered in triage and are remarkable for hyponatremia with a sodium of 111, bicarb of 18, glucose 136, creatinine of 1.26 with normal BUN 13, anion gap normal, leukocytosis of 16.6. Chest xray also ordered in triage. I viewed image which shows AC joint separation and right rib fractures involving the seventh and eighth ribs at least. Dedicated chest film ordered and shows fractures of ribs 7-9 with displacement.   Hyponatremia likely related to hypovolemia. Started 1 L NS at 247ml/hr. Patient given pain medicine and antiemetic. UA not yet collected. Patient's vitals do not indicate sepsis, will hold off on antibiotics for UTI until UA results.  This case was discussed with Dr. Roderic Palau who has seen the patient and agrees with plan to admit.  Spoke with Dr. Cyndia Skeeters with hospitalist service who agrees to assume care of patient and bring into the hospital for further evaluation and management.  Appreciate admission.    Portions of this note were generated with [employer]. Dictation errors may occur despite best attempts at proofreading.    Final Clinical Impression(s) / ED Diagnoses Final diagnoses:  Hyponatremia  Closed fracture of multiple ribs of right side, initial encounter    Rx / DC Orders ED Discharge Orders    None       Flint Melter 03/05/20 1303    Milton Ferguson, MD 03/07/20 [phone]

## 2020-03-05 NOTE — H&P (Signed)
History and Physical    Christy Wade:[id number] DOB: 02-04-1936 DOA: 03/05/2020  PCP: Ronnald Nian, DO Patient coming from: Home.  Lives alone.  Ambulates independently at baseline.  Chief Complaint: Fall at home  HPI: Christy Wade is a 84 y.o. female with history of HTN, hypothyroidism and osteoporosis presenting after fall at home.  Patient developed nausea, vomiting and diarrhea about 4 days ago.  Diarrhea subsided 2 days ago.  She continues to have nausea and vomiting.  She could not quantify number of emesis.  Emesis was nonbilious and nonbloody.  Patient had a taco from [employer] about 4 days ago.  Patient went to urgent care yesterday and was given Zofran ODT and sent home. She got up early this morning about 5:18 AM and fell.  She thinks she landed on the right side.  Patient's daughter thinks she might have landed on the edge of the table.  She could not get up, so called her daughter who found her lying on the right side on the floor.  She denies hitting her head or loss of consciousness.  She has been feeling weak.  Denies dizziness, lightheadedness, headache, vision change, chills, fever, chest pain, dyspnea, abdominal pain or UTI symptoms.  Denies focal numbness or tingling.  Reports some pain in her right shoulder and right chest with movement.  Patient is vaccinated against COVID-19 infection.  Of note, patient was switched from lisinopril to lisinopril HCTZ by her PCP about 5 days ago.  Patient lives alone.  Ambulates independently at baseline.  Never smoked cigarettes.  Denies alcohol recreational drug use.  In ED, slightly elevated BP. Na 111 (133 about 4 months ago). Cl 78.  Bicarb 18.  AG 15. Cr 1.26 (baseline 0.1-1.1).  BUN 13.  Total bili 1.3.  WBC 16.6.  Portable CXR with 7 through ninth rib fracture with prominent displacement.  Received fentanyl, IV morphine, Zofran, Phenergan and normal saline bolus.  Hospitalist service was called for  admission.  ROS All review of system negative except for pertinent positives and negatives as history of present illness above.  PMH Past Medical History:  Diagnosis Date  . Anemia    on iron now  . Cataract   . Hypertension   . Hypothyroidism   . Osteoporosis   . PONV (postoperative nausea and vomiting)   . Thyroid disease   . Varicose vein    PSH Past Surgical History:  Procedure Laterality Date  . ABDOMINAL HYSTERECTOMY  1975   partial  . APPENDECTOMY  1975   with hysterectomy  . HEMORROIDECTOMY     [phone]  . LAPAROSCOPIC SMALL BOWEL RESECTION N/A 11/29/2014   Procedure: LAPAROSCOPIC SMALL INTESTINE RESECTION;  Surgeon: Michael Boston, MD;  Location: [facility];  Service: General;  Laterality: N/A;  . VARICOSE VEIN SURGERY Left 1971   Fam HX Family History  Problem Relation Age of Onset  . Bone cancer Mother   . Diabetes Father   . Stroke Father   . Breast cancer Sister   . Stomach cancer Neg Hx   . Colon cancer Neg Hx     Social Hx  reports that she has never smoked. She has never used smokeless tobacco. She reports that she does not drink alcohol and does not use drugs.  Allergy Allergies  Allergen Reactions  . Codeine Nausea Only    REACTION: nausea  . Sulfamethoxazole Rash    REACTION: rash   Home Meds Prior to Admission medications  Medication Sig Start Date End Date Taking? Authorizing Provider  CALCIUM-VITAMIN D PO Take 1 tablet by mouth daily after lunch.    [provider]  cycloSPORINE (RESTASIS) 0.05 % ophthalmic emulsion 1 drop 2 (two) times daily.    [provider]  levothyroxine (SYNTHROID) 50 MCG tablet TAKE 1 TABLET BY MOUTH EVERY MORNING 08/13/19   Christy Nutting, DO  lisinopril-hydrochlorothiazide (ZESTORETIC) 20-25 MG tablet Take 1 tablet by mouth daily. 12/20/19   CiriglianoGarvin Fila, DO  Multiple Vitamin (MULTIVITAMIN) tablet Take 1 tablet by mouth daily with lunch.     [provider]  traMADol (ULTRAM)  50 MG tablet TAKE 1 TABLET(50 MG) BY MOUTH EVERY 8 HOURS AS NEEDED 11/15/18   Christy Nutting, DO  vitamin B-12 (CYANOCOBALAMIN) 500 MCG tablet Take 1,000 mcg by mouth daily.    [provider]    Physical Exam: Vitals:   03/05/20 1233 03/05/20 1245 03/05/20 1302 03/05/20 1333  BP: (!) 134/98 (!) 138/124 (!) 151/79   Pulse: 90 88 88 92  Resp:  18 (!) 23 (!) 23  Temp:      TempSrc:      SpO2: 100% 98% 97% 98%  Weight:      Height:        GENERAL: No acute distress.  Appears well.  HEENT: MMM.  Vision and hearing grossly intact.  NECK: Supple.  No apparent JVD.  RESP: On room air.  No IWOB. Good air movement bilaterally. CVS:  RRR. Heart sounds normal.  ABD/GI/GU: Bowel sounds present. Soft. Non tender.  MSK/EXT:  Moves extremities. No apparent deformity or edema.  SKIN: Small erythematous skin bruise over right mid back. NEURO: Awake, alert and oriented appropriately.  No gross deficit.  PSYCH: Calm. Normal affect.   Personally Reviewed Radiological Exams DG Shoulder Right  Result Date: 03/05/2020 CLINICAL DATA:  Fall with shoulder pain EXAM: RIGHT SHOULDER - 2+ VIEW COMPARISON:  None. FINDINGS: Posterior right seventh and eighth rib fractures. No visible pneumothorax but there is soft tissue gas on the lateral view and marked displacement of 1 of the fractures. Corticated defect in the acromion. Offset at the acromioclavicular joint by at least 50%. No fracture deformity. No coracoclavicular interval widening IMPRESSION: 1. AC joint offset suggesting separated shoulder. 2. Right rib fractures involving the seventh and eighth ribs at least. One of these fractures is markedly displaced and there is adjacent chest wall emphysema. No visible pneumothorax. 3. Os acromiale. Electronically Signed   By: Monte Fantasia M.D.   On: 03/05/2020 09:48   DG Chest Portable 1 View  Result Date: 03/05/2020 CLINICAL DATA:  Fall with rib fractures on shoulder radiograph EXAM: PORTABLE CHEST 1  VIEW COMPARISON:  11/10/2005 FINDINGS: Right seventh, eighth, and ninth rib fractures with prominent displacement. Streaky opacity at the right base. No visible effusion or pneumothorax. Mild cardiomegaly. Aortic tortuosity. Shoulder findings as on dedicated study IMPRESSION: 1. Right seventh through ninth rib fractures with prominent displacement. 2. Atelectasis at the right base.  No visible pneumothorax. Electronically Signed   By: Monte Fantasia M.D.   On: 03/05/2020 11:54     Personally Reviewed Labs: CBC: Recent Labs  Lab 03/05/20 0913  WBC 16.6*  HGB 13.7  HCT 38.3  MCV 84.9  PLT 413   Basic Metabolic Panel: Recent Labs  Lab 03/05/20 0913  NA 111*  K 3.6  CL 78*  CO2 18*  GLUCOSE 136*  BUN 13  CREATININE 1.26*  CALCIUM 9.0   GFR:  Estimated Creatinine Clearance: 34.1 mL/min (A) (by C-G formula based on SCr of 1.26 mg/dL (H)). Liver Function Tests: Recent Labs  Lab 03/05/20 0913  AST 74*  ALT 35  ALKPHOS 64  BILITOT 1.3*  PROT 8.3*  ALBUMIN 4.3   Recent Labs  Lab 03/05/20 0913  LIPASE 31   No results for input(s): AMMONIA in the last 168 hours. Coagulation Profile: No results for input(s): INR, PROTIME in the last 168 hours. Cardiac Enzymes: No results for input(s): CKTOTAL, CKMB, CKMBINDEX, TROPONINI in the last 168 hours. BNP (last 3 results) No results for input(s): PROBNP in the last 8760 hours. HbA1C: No results for input(s): HGBA1C in the last 72 hours. CBG: No results for input(s): GLUCAP in the last 168 hours. Lipid Profile: No results for input(s): CHOL, HDL, LDLCALC, TRIG, CHOLHDL, LDLDIRECT in the last 72 hours. Thyroid Function Tests: No results for input(s): TSH, T4TOTAL, FREET4, T3FREE, THYROIDAB in the last 72 hours. Anemia Panel: No results for input(s): VITAMINB12, FOLATE, FERRITIN, TIBC, IRON, RETICCTPCT in the last 72 hours. Urine analysis:    Component Value Date/Time   COLORURINE yellow 03/02/2010 0000   APPEARANCEUR Clear  03/02/2010 0000   LABSPEC <1.005 03/02/2010 0000   PHURINE 5.0 03/02/2010 0000   HGBUR negative 03/02/2010 0000   BILIRUBINUR NEGATIVE 04/16/2019 1000   PROTEINUR Negative 04/16/2019 1000   UROBILINOGEN 0.2 04/16/2019 1000   UROBILINOGEN 0.2 03/02/2010 0000   NITRITE POSITIVE 04/16/2019 1000   NITRITE negative 03/02/2010 0000   LEUKOCYTESUR Large (3+) (A) 04/16/2019 1000    Sepsis Labs:  None  Personally Reviewed EKG:  No EKG needed.  Assessment/Plan Hypochloremic Hyponatremia-Na 111 (133 about 4 months ago) this is likely a combination of dehydration from GI loss and HCTZ.  No neuro deficits. -Continue IV normal saline at 100 cc an hour -Monitor sodium every 6 hours -Check TSH, urine sodium and osmolality  Fall at home-likely due to dehydration and hyponatremia Multiple rib fractures, seventh through ninth with significant displacement -Conservative measures with pain medications and incentive spirometry -PT/OT eval  Nausea/vomiting/diarrhea-viral gastroenteritis?  Diarrhea resolved 2 days ago.  Abdominal exam benign. -IV fluid as above -Antiemetics  Metabolic acidosis: Likely due to GI loss -Continue monitoring -Sodium bicarb if worse  Essential hypertension: BP slightly elevated. -Start low-dose amlodipine -As needed labetalol  Hypothyroidism -Check TSH -Continue home Synthroid  DVT prophylaxis: Subcu Lovenox  Code Status: Full code Family Communication: Updated patient's daughter at bedside  Disposition Plan: Telemetry bed Consults called: None Admission status: Inpatient  Severity of Illness: Patient with significant hyponatremia that needs slow correction and close monitoring over the next 48 to 72 hours to prevent adverse outcomes such as pontine demyelination syndrome   Mercy Riding MD [facility]  If 7PM-7AM, please contact night-coverage [email or web address]  03/05/2020, 2:09 PM

## 2020-03-05 NOTE — ED Notes (Signed)
Date and time results received: 03/05/20 9:58 AM  (use smartphrase ".now" to insert current time)  Test: Na+ Critical Value: 111  Name of Provider Notified: Nurse [place]  Orders Received? Or Actions Taken?:

## 2020-03-05 NOTE — ED Notes (Signed)
Date and time results received: 03/05/20 3:55 PM  (use smartphrase ".now" to insert current time)  Test: Sodium Critical Value: 113  Name of Provider Notified: [place]  Orders Received? Or Actions Taken?: Orders Received - See Orders for details

## 2020-03-05 NOTE — ED Notes (Signed)
Pt aware UA sample is needed. Patient made aware to call out when ready to void.

## 2020-03-06 ENCOUNTER — Ambulatory Visit: Payer: Medicare PPO | Admitting: Family Medicine

## 2020-03-06 DIAGNOSIS — S2241XD Multiple fractures of ribs, right side, subsequent encounter for fracture with routine healing: Secondary | ICD-10-CM

## 2020-03-06 LAB — BASIC METABOLIC PANEL
Anion gap: 12 (ref 5–15)
Anion gap: 11 (ref 5–15)
Anion gap: 10 (ref 5–15)
Anion gap: 8 (ref 5–15)
Anion gap: 11 (ref 5–15)
BUN: 15 mg/dL (ref 8–23)
BUN: 17 mg/dL (ref 8–23)
BUN: 16 mg/dL (ref 8–23)
BUN: 14 mg/dL (ref 8–23)
BUN: 16 mg/dL (ref 8–23)
CO2: 18 mmol/L — ABNORMAL LOW (ref 22–32)
CO2: 18 mmol/L — ABNORMAL LOW (ref 22–32)
CO2: 19 mmol/L — ABNORMAL LOW (ref 22–32)
CO2: 19 mmol/L — ABNORMAL LOW (ref 22–32)
CO2: 18 mmol/L — ABNORMAL LOW (ref 22–32)
Calcium: 8.1 mg/dL — ABNORMAL LOW (ref 8.9–10.3)
Calcium: 8.1 mg/dL — ABNORMAL LOW (ref 8.9–10.3)
Calcium: 8 mg/dL — ABNORMAL LOW (ref 8.9–10.3)
Calcium: 7.8 mg/dL — ABNORMAL LOW (ref 8.9–10.3)
Calcium: 8.1 mg/dL — ABNORMAL LOW (ref 8.9–10.3)
Chloride: 81 mmol/L — ABNORMAL LOW (ref 98–111)
Chloride: 82 mmol/L — ABNORMAL LOW (ref 98–111)
Chloride: 81 mmol/L — ABNORMAL LOW (ref 98–111)
Chloride: 84 mmol/L — ABNORMAL LOW (ref 98–111)
Chloride: 85 mmol/L — ABNORMAL LOW (ref 98–111)
Creatinine, Ser: 1.46 mg/dL — ABNORMAL HIGH (ref 0.44–1.00)
Creatinine, Ser: 1.24 mg/dL — ABNORMAL HIGH (ref 0.44–1.00)
Creatinine, Ser: 1.21 mg/dL — ABNORMAL HIGH (ref 0.44–1.00)
Creatinine, Ser: 1.09 mg/dL — ABNORMAL HIGH (ref 0.44–1.00)
Creatinine, Ser: 1.13 mg/dL — ABNORMAL HIGH (ref 0.44–1.00)
GFR calc Af Amer: 39 mL/min — ABNORMAL LOW (ref >60)
GFR calc Af Amer: 47 mL/min — ABNORMAL LOW (ref >60)
GFR calc Af Amer: 49 mL/min — ABNORMAL LOW (ref >60)
GFR calc Af Amer: 55 mL/min — ABNORMAL LOW (ref >60)
GFR calc Af Amer: 53 mL/min — ABNORMAL LOW (ref >60)
GFR calc non Af Amer: 33 mL/min — ABNORMAL LOW (ref >60)
GFR calc non Af Amer: 41 mL/min — ABNORMAL LOW (ref >60)
GFR calc non Af Amer: 42 mL/min — ABNORMAL LOW (ref >60)
GFR calc non Af Amer: 48 mL/min — ABNORMAL LOW (ref >60)
GFR calc non Af Amer: 46 mL/min — ABNORMAL LOW (ref >60)
Glucose, Bld: 112 mg/dL — ABNORMAL HIGH (ref 70–99)
Glucose, Bld: 105 mg/dL — ABNORMAL HIGH (ref 70–99)
Glucose, Bld: 112 mg/dL — ABNORMAL HIGH (ref 70–99)
Glucose, Bld: 110 mg/dL — ABNORMAL HIGH (ref 70–99)
Glucose, Bld: 112 mg/dL — ABNORMAL HIGH (ref 70–99)
Potassium: 4.3 mmol/L (ref 3.5–5.1)
Potassium: 4 mmol/L (ref 3.5–5.1)
Potassium: 4.2 mmol/L (ref 3.5–5.1)
Potassium: 4 mmol/L (ref 3.5–5.1)
Potassium: 3.9 mmol/L (ref 3.5–5.1)
Sodium: 111 mmol/L — CL (ref 135–145)
Sodium: 111 mmol/L — CL (ref 135–145)
Sodium: 110 mmol/L — CL (ref 135–145)
Sodium: 111 mmol/L — CL (ref 135–145)
Sodium: 114 mmol/L — CL (ref 135–145)

## 2020-03-06 LAB — CBC
HCT: 34.6 % — ABNORMAL LOW (ref 36.0–46.0)
Hemoglobin: 12.1 g/dL (ref 12.0–15.0)
MCH: 30.3 pg (ref 26.0–34.0)
MCHC: 35 g/dL (ref 30.0–36.0)
MCV: 86.5 fL (ref 80.0–100.0)
Platelets: 198 10*3/uL (ref 150–400)
RBC: 4 MIL/uL (ref 3.87–5.11)
RDW: 13.2 % (ref 11.5–15.5)
WBC: 11.2 10*3/uL — ABNORMAL HIGH (ref 4.0–10.5)
nRBC: 0 % (ref 0.0–0.2)

## 2020-03-06 LAB — URINALYSIS, ROUTINE W REFLEX MICROSCOPIC
Bilirubin Urine: NEGATIVE
Glucose, UA: NEGATIVE mg/dL
Ketones, ur: NEGATIVE mg/dL
Nitrite: POSITIVE — AB
Protein, ur: NEGATIVE mg/dL
Specific Gravity, Urine: 1.012 (ref 1.005–1.030)
WBC, UA: >50 WBC/hpf — ABNORMAL HIGH (ref 0–5)
pH: 5 (ref 5.0–8.0)

## 2020-03-06 LAB — OSMOLALITY, URINE: Osmolality, Ur: 355 mOsm/kg (ref 300–900)

## 2020-03-06 LAB — SODIUM
Sodium: 109 mmol/L — CL (ref 135–145)
Sodium: 110 mmol/L — CL (ref 135–145)

## 2020-03-06 LAB — OSMOLALITY: Osmolality: 236 mOsm/kg — CL (ref 275–295)

## 2020-03-06 LAB — SODIUM, URINE, RANDOM: Sodium, Ur: 16 mmol/L

## 2020-03-06 LAB — T4, FREE: Free T4: 1.39 ng/dL — ABNORMAL HIGH (ref 0.61–1.12)

## 2020-03-06 LAB — URIC ACID: Uric Acid, Serum: 4.6 mg/dL (ref 2.5–7.1)

## 2020-03-06 LAB — MRSA PCR SCREENING: MRSA by PCR: NEGATIVE

## 2020-03-06 MED ORDER — ENOXAPARIN SODIUM 30 MG/0.3ML ~~LOC~~ SOLN: 30.0000 mg | SUBCUTANEOUS | Status: DC

## 2020-03-06 MED ORDER — ENOXAPARIN SODIUM 40 MG/0.4ML ~~LOC~~ SOLN
40.0000 mg | SUBCUTANEOUS | Status: DC
  Administered 2020-03-06 – 2020-03-13 (×8): 40 mg via SUBCUTANEOUS
  Filled 2020-03-06 (×8): qty 0.4

## 2020-03-06 MED ORDER — CHLORHEXIDINE GLUCONATE CLOTH 2 % EX PADS
6.0000 | MEDICATED_PAD | Freq: Every day | CUTANEOUS | Status: DC
  Administered 2020-03-06 – 2020-03-11 (×5): 6 via TOPICAL

## 2020-03-06 MED ORDER — SODIUM CHLORIDE 3 % IV SOLN
INTRAVENOUS | Status: DC
  Filled 2020-03-06 (×3): qty 500

## 2020-03-06 MED ORDER — SODIUM CHLORIDE 0.9 % IV SOLN
1.0000 g | INTRAVENOUS | Status: DC
  Administered 2020-03-06 – 2020-03-09 (×4): 1 g via INTRAVENOUS
  Filled 2020-03-06: qty 10
  Filled 2020-03-06: qty 1
  Filled 2020-03-06 (×2): qty 10

## 2020-03-06 MED ORDER — SODIUM BICARBONATE 650 MG PO TABS
650.0000 mg | ORAL_TABLET | Freq: Once | ORAL | Status: AC
  Administered 2020-03-06: 650 mg via ORAL
  Filled 2020-03-06: qty 1

## 2020-03-06 NOTE — Progress Notes (Signed)
CRITICAL VALUE ALERT  Critical Value:  Sodium 111  Date & Time Notied:  03/05/2233  Provider Notified: M. Sharlet Salina NP  Orders Received/Actions taken: New order received

## 2020-03-06 NOTE — Consult Note (Signed)
Nephrology Consult  [facility]  Requesting provider: Karie Kirks, DO Reason for consult: hyponatremia   Assessment/Recommendations: Christy Wade is a/an 84 y.o. female with a past medical history notable for hyponatremia  Severe Hyponatremia: - secondary to HCTZ/ diarrhea/ n/v--> I suspect a mixed picture of hypovolemic hyponatremia and SIADH -continue to check Na q2h -3%NS @ 30cc/hr. Goal Na should be around 115-117 in the next 24 hours. -serial neurochecks -check free t4 and t3 (tsh 8.6) -uric acid normal  AKI -baseline cr seems to be around 1-1.1 -likely etiology is secondary to pre-renal injury from n/v/d -peak cr 1.5, monitor labs for now if not improving then check renal ultrasound -recommend checking urine culture (pyuria and hematuria present) -hold RAAS inhibition for now -Monitor Daily I/Os, Daily weight  -Maintain MAP>65 for optimal renal perfusion.  -Agree with holding ACE-I, avoid further nephrotoxins including NSAIDS, Morphine.  Unless absolutely necessary, avoid CT with contrast and/or MRI with gadolinium.     Hypertension: -resume norvasc, can increase this to 10mg  daily if needed  Non anion gap metabolic acidosis -likely secondary to diarrhea, expect this to improve provided her diarrhea improves  Diabetes Mellitus Type 2: mgmt per primary service     [facility] 03/06/2020 4:42 PM   _____________________________________________________________________________________   History of Present Illness: Christy Wade is a/an 84 y.o. female with a past medical history of HTN, hypothyroidism, DM2, osteoporosis who presents to [facility] with a fall. She had developed N/V/D about 4 days ago (diarrhea subsided about 2 days prior to admission). She had a fall on the day of her admission which was thought to be mechanical in nature (? Question seizure activity as well per pt and family). She was recently switched from  lisinopril to lisinopril-HCTZ about 5 days ago.  Severe hyponatremia on admission to 111. She received NS and her sodium decreased to 109. Started on 3%NS at 30cc/hr.  In this setting we are asked to see.  Noted on CXR are 3 rib fractures as well.  She last saw Dr. Royce Macadamia about 8-9 months ago for hyponatremia- which was normal at the time, was advised to follow up in 1 year.    Her last Na available which was drawn 1 hr after beginning 3% was 110.  No observed seizure activity here.  Had to have I/O cath as couldn't void.  UA looks dirty, culture in progress.  HCTZ is being held.   Medications:  Current Facility-Administered Medications  Medication Dose Route Frequency Provider Last Rate Last Admin  . acetaminophen (TYLENOL) tablet 1,000 mg  1,000 mg Oral Q8H Swayze, Ava, DO   1,000 mg at 03/06/20 0606  . amLODipine (NORVASC) tablet 5 mg  5 mg Oral Daily Swayze, Ava, DO   5 mg at 03/06/20 0946  . Chlorhexidine Gluconate Cloth 2 % PADS 6 each  6 each Topical Daily Swayze, Ava, DO   6 each at 03/06/20 1109  . cycloSPORINE (RESTASIS) 0.05 % ophthalmic emulsion 1 drop  1 drop Both Eyes BID Swayze, Ava, DO   1 drop at 03/06/20 0946  . enoxaparin (LOVENOX) injection 30 mg  30 mg Subcutaneous Q24H Eudelia Bunch, RPH      . HYDROmorphone (DILAUDID) injection 0.5 mg  0.5 mg Intravenous Q3H PRN Swayze, Ava, DO   0.5 mg at 03/05/20 2018  . labetalol (NORMODYNE) injection 10 mg  10 mg Intravenous Q2H PRN Swayze, Ava, DO      . levothyroxine (SYNTHROID)  tablet 50 mcg  50 mcg Oral Q0600 Swayze, Ava, DO   50 mcg at 03/06/20 0606  . ondansetron (ZOFRAN) tablet 4 mg  4 mg Oral Q6H PRN Swayze, Ava, DO       Or  . ondansetron (ZOFRAN) injection 4 mg  4 mg Intravenous Q6H PRN Swayze, Ava, DO   4 mg at 03/06/20 1111  . oxyCODONE (Oxy IR/ROXICODONE) immediate release tablet 5 mg  5 mg Oral Q4H PRN Swayze, Ava, DO      . senna-docusate (Senokot-S) tablet 1 tablet  1 tablet Oral QHS PRN Swayze, Ava, DO      .  sodium chloride (hypertonic) 3 % solution   Intravenous Continuous Swayze, Ava, DO 30 mL/hr at 03/06/20 1109 New Bag at 03/06/20 1109  . traZODone (DESYREL) tablet 25 mg  25 mg Oral QHS PRN Swayze, Ava, DO         ALLERGIES Codeine and Sulfamethoxazole  MEDICAL HISTORY Past Medical History:  Diagnosis Date  . Anemia    on iron now  . Cataract   . Hypertension   . Hypothyroidism   . Osteoporosis   . PONV (postoperative nausea and vomiting)   . Thyroid disease   . Varicose vein      SOCIAL HISTORY Social History   Socioeconomic History  . Marital status: Widowed    Spouse name: Not on file  . Number of children: Not on file  . Years of education: Not on file  . Highest education level: Not on file  Occupational History  . Occupation: Retired  Tobacco Use  . Smoking status: Never Smoker  . Smokeless tobacco: Never Used  Vaping Use  . Vaping Use: Never used  Substance and Sexual Activity  . Alcohol use: No    Alcohol/week: 0.0 standard drinks  . Drug use: No  . Sexual activity: Not Currently  Other Topics Concern  . Not on file  Social History Narrative  . Not on file   Social Determinants of Health   Financial Resource Strain: Low Risk   . Difficulty of Paying Living Expenses: Not hard at all  Food Insecurity: No Food Insecurity  . Worried About Charity fundraiser in the Last Year: Never true  . Ran Out of Food in the Last Year: Never true  Transportation Needs: No Transportation Needs  . Lack of Transportation (Medical): No  . Lack of Transportation (Non-Medical): No  Physical Activity: Inactive  . Days of Exercise per Week: 0 days  . Minutes of Exercise per Session: 0 min  Stress: No Stress Concern Present  . Feeling of Stress : Not at all  Social Connections: Moderately Integrated  . Frequency of Communication with Friends and Family: More than three times a week  . Frequency of Social Gatherings with Friends and Family: More than three times a week   . Attends Religious Services: More than 4 times per year  . Active Member of Clubs or Organizations: Yes  . Attends Archivist Meetings: More than 4 times per year  . Marital Status: Widowed  Intimate Partner Violence:   . Fear of Current or Ex-Partner: Not on file  . Emotionally Abused: Not on file  . Physically Abused: Not on file  . Sexually Abused: Not on file     FAMILY HISTORY Family History  Problem Relation Age of Onset  . Bone cancer Mother   . Diabetes Father   . Stroke Father   . Breast cancer Sister   .  Stomach cancer Neg Hx   . Colon cancer Neg Hx      Review of Systems: 12 systems reviewed Otherwise as per HPI, all other systems reviewed and negative  Physical Exam: Vitals:   03/06/20 1030 03/06/20 1100  BP: (!) 187/100 (!) 154/78  Pulse:    Resp:    Temp:    SpO2:     Total I/O In: -  Out: 150 [Urine:150]  Intake/Output Summary (Last 24 hours) at 03/06/2020 1159 Last data filed at 03/06/2020 0900 Gross per 24 hour  Intake 2693.83 ml  Output 1750 ml  Net 943.83 ml   General: well-appearing, no acute distress HEENT: anicteric sclera, oropharynx clear without lesions CV: regular rate, normal rhythm, no murmurs, no gallops, no rubs Lungs: clear to auscultation bilaterally, normal work of breathing Abd: soft, slight suprapubic tenderness Skin: no visible lesions or rashes, fair turgor Neuro: normal speech, no gross focal deficits, R hand tremor  Test Results Reviewed Lab Results  Component Value Date   NA 110 (LL) 03/06/2020   K 4.2 03/06/2020   CL 81 (L) 03/06/2020   CO2 19 (L) 03/06/2020   BUN 16 03/06/2020   CREATININE 1.21 (H) 03/06/2020   GFR 46.65 (L) 11/14/2019   CALCIUM 8.0 (L) 03/06/2020   ALBUMIN 4.3 03/05/2020   PHOS 3.7 04/16/2019

## 2020-03-06 NOTE — Progress Notes (Signed)
OT Cancellation Note  Patient Details Name: Christy Wade MRN: [id number] DOB: Nov 21, 1935   Cancelled Treatment:    Reason Eval/Treat Not Completed: Patient not medically ready. Patient being transferred to ICU, with critically low sodium. Will check back as schedule permits.  Delbert Phenix OT OT pager: [place] 03/06/2020, 11:02 AM

## 2020-03-06 NOTE — Progress Notes (Signed)
PT Cancellation Note  Patient Details Name: Christy Wade MRN: [id number] DOB: 1935/10/23   Cancelled Treatment:    Reason Eval/Treat Not Completed: Medical issues which prohibited therapy (sodium critically low at 109, pt moving to ICU. Will follow.)   Philomena Doheny PT 03/06/2020  Acute Rehabilitation Services Pager [phone] Office [phone]

## 2020-03-06 NOTE — Progress Notes (Signed)
3% Saline infusion started in new 20g PIV in patient's Right arm with iWatch monitoring in place.

## 2020-03-06 NOTE — Progress Notes (Signed)
CRITICAL VALUE ALERT  Critical Value:  Sodium 111  Date & Time Notied:  03/06/2020  Provider Notified: M. Sharlet Salina, NP  Orders Received/Actions taken: no new orders

## 2020-03-06 NOTE — Progress Notes (Signed)
CRITICAL VALUE ALERT  Critical Value:  NA 109  Date & Time Notied:  03/06/20 0810 Provider Notified: Benny Lennert  Orders Received/Actions taken:

## 2020-03-06 NOTE — Progress Notes (Signed)
PROGRESS NOTE  GRACIELLA ARMENT VOJ:[id number] DOB: [birth year] DOA: 03/05/2020 PCP: Ronnald Nian, DO  Brief History   The patient is a 84 yr old woman who presented to [facility] ED on 03/05/2020 after a fall. The patient began having nausea/vomiting/diarrhea on 03/01/2020. Her diarrhea subsided on 03/03/2020, but she has continued to have nausea and vomiting which has been bilious and non-bloody. She thought that it may have been caused by a taco from a local restaurant on 02/29/2020. She was seen at an urgent care and was given zofran and sent home. On 03/05/2020, the patient got out of bed, and fell. She was unable to get up on her own due to weakness. Of note, HCTZ was added to this patient's antihypertensive regimen on 02/29/2020. She did not hit her head.  In the ED the patient was found to be significantly hyponatremic at 111, acidotic with bicarb of 18 and anion gap of 18. She was also hypochloremic at 18, and her creatinine was elevated at 1.26. CXR demonstrated fracture of the 7th through ninth rib with prominent displacement. The patient has maintained oxygen saturations in the high 90's on room air.  [facility] were consulted to admit the patient for further evaluation and care. She has been admitted to a telemetry bed. Initially her hyponatremia was treated with NS. However, her sodium has dropped to 109. I have transferred the patient to the ICU, placed her on 3% NS at 30 cc/hr. TSH, FT4, Urine osmolality and sodium, and serum urea have been ordered.  Consultants  . Nephrology  Procedures  . None  Antibiotics   Anti-infectives (From admission, onward)   None    .  Subjective  The patient is awake, alert, and oriented x 3. She is cachectic, elderly, weak, and frail. She has no new complaints.  Objective   Vitals:  Vitals:   03/06/20 0456 03/06/20 0951  BP: 130/61 (!) 162/97  Pulse: 77 81  Resp: 16 18  Temp: 98.3 F (36.8 C)   SpO2: 96% 98%   Exam:  Constitutional:   . The patient is awake, alert, and oriented x 3. She appears elderly, cachectic, and frail. No acute distress. Respiratory:  . No increased work of breathing. . No wheezes, rales, or rhonchi . No tactile fremitus Cardiovascular:  . Regular rate and rhythm . No murmurs, ectopy, or gallups. . No lateral PMI. No thrills. Abdomen:  . Abdomen is soft, non-tender, non-distended . No hernias, masses, or organomegaly . Normoactive bowel sounds.  Musculoskeletal:  . No cyanosis, clubbing, or edema Skin:  . No rashes, lesions, ulcers . palpation of skin: no induration or nodules Neurologic:  . CN 2-12 intact . Sensation all 4 extremities intact Psychiatric:  . Mental status o Mood, affect appropriate o Orientation to person, place, time  . judgment and insight appear intact   I have personally reviewed the following:   Today's Data  . Vitals, BMP, CBC  Imaging  . CXR  Scheduled Meds: . acetaminophen  1,000 mg Oral Q8H  . amLODipine  5 mg Oral Daily  . Chlorhexidine Gluconate Cloth  6 each Topical Daily  . cycloSPORINE  1 drop Both Eyes BID  . enoxaparin (LOVENOX) injection  30 mg Subcutaneous Q24H  . levothyroxine  50 mcg Oral Q0600   Continuous Infusions: . sodium chloride (hypertonic) 30 mL/hr at 03/06/20 1109    Problem  Multiple Fractures of Ribs, Right Side, Subsequent Encounter for Fracture With Routine Healing  Hyponatremia  Diarrhea  Chronic Chest Wall Pain  Essential Hypertension  Hypothyroidism     LOS: 1 day   A & P   Hyponatremia: Multi-factorial (Volume depletion due to diarrhea/nausea/and vomiting + SIADH due to HCTZ+ pain due to rib fractures). Intermittent worsening since admission despite 9% NS. Sodium down to 109 this am. Patient was transferred to the ICU, given 3% NS at 30 cc/hr, and nephrology consulted. Urine sodium, osmolality, TSH, and Uric acid ordered.   N/V/D: Diarrhea has subsided, but the patient continues to complain of nausea.  Stool studies are pending, but patient has not produced a stool. She is receiving a regular diet, but has poor intake.  Displaced fractures of right ribs 7-9 are seen on portable chest. Patient is showing no signs of respiratory distress currently, and oxygenation is not compromised. Pain will be addressed and the patient will be observed for signs of altered respiratory mechanics affecting her respiratory status. Otherwise plan to manage these fractures conservatively.  AKI: Creatinine elevated to 1.25 on admission. It appears that her baseline creatinine is 1.1 consistent with CKD II. Continue IV fluids. Avoid nephrotoxic medications and hypotension. Monitor creatinine, electrolytes, and volume status.  Chronic chest wall pain: Noted. Complicates monitoring of acute pain from rib fractures.   Essential Hypertension: Blood pressures are a little high. Lisinopril held due to elevated creatinine. Will add Norvasc.  I have seen and examined this patient myself. I have spent 38 minutes in her evaluation and care.  DVT Prophylaxis: Lovenox CODE STATUS: Full Code Family Communication: none available Disposition:  Status is: Inpatient  Remains inpatient appropriate because:IV treatments appropriate due to intensity of illness or inability to take PO   Dispo: The patient is from: Home              Anticipated d/c is to: Home              Anticipated d/c date is: 3 days              Patient currently is not medically stable to d/c.  Jarquez Mestre, DO [facility] Direct contact: see [email or web address]  7PM-7AM contact night coverage as above 03/06/2020, 11:38 AM  LOS: 1 day         .

## 2020-03-07 LAB — BASIC METABOLIC PANEL
Anion gap: 14 (ref 5–15)
BUN: 14 mg/dL (ref 8–23)
CO2: 19 mmol/L — ABNORMAL LOW (ref 22–32)
Calcium: 8.6 mg/dL — ABNORMAL LOW (ref 8.9–10.3)
Chloride: 88 mmol/L — ABNORMAL LOW (ref 98–111)
Creatinine, Ser: 1.05 mg/dL — ABNORMAL HIGH (ref 0.44–1.00)
GFR calc Af Amer: 58 mL/min — ABNORMAL LOW (ref >60)
GFR calc non Af Amer: 50 mL/min — ABNORMAL LOW (ref >60)
Glucose, Bld: 94 mg/dL (ref 70–99)
Potassium: 3.9 mmol/L (ref 3.5–5.1)
Sodium: 121 mmol/L — ABNORMAL LOW (ref 135–145)

## 2020-03-07 LAB — RENAL FUNCTION PANEL
Albumin: 3.2 g/dL — ABNORMAL LOW (ref 3.5–5.0)
Anion gap: 10 (ref 5–15)
BUN: 11 mg/dL (ref 8–23)
CO2: 18 mmol/L — ABNORMAL LOW (ref 22–32)
Calcium: 7.9 mg/dL — ABNORMAL LOW (ref 8.9–10.3)
Chloride: 92 mmol/L — ABNORMAL LOW (ref 98–111)
Creatinine, Ser: 0.9 mg/dL (ref 0.44–1.00)
GFR calc Af Amer: >60 mL/min (ref >60)
GFR calc non Af Amer: 60 mL/min — ABNORMAL LOW (ref >60)
Glucose, Bld: 107 mg/dL — ABNORMAL HIGH (ref 70–99)
Phosphorus: 2.2 mg/dL — ABNORMAL LOW (ref 2.5–4.6)
Potassium: 3.8 mmol/L (ref 3.5–5.1)
Sodium: 120 mmol/L — ABNORMAL LOW (ref 135–145)

## 2020-03-07 LAB — CBC WITH DIFFERENTIAL/PLATELET
Abs Immature Granulocytes: 0.06 10*3/uL (ref 0.00–0.07)
Basophils Absolute: 0 10*3/uL (ref 0.0–0.1)
Basophils Relative: 0 %
Eosinophils Absolute: 0 10*3/uL (ref 0.0–0.5)
Eosinophils Relative: 0 %
HCT: 33.8 % — ABNORMAL LOW (ref 36.0–46.0)
Hemoglobin: 12.3 g/dL (ref 12.0–15.0)
Immature Granulocytes: 1 %
Lymphocytes Relative: 12 %
Lymphs Abs: 1.4 10*3/uL (ref 0.7–4.0)
MCH: 30.4 pg (ref 26.0–34.0)
MCHC: 36.4 g/dL — ABNORMAL HIGH (ref 30.0–36.0)
MCV: 83.7 fL (ref 80.0–100.0)
Monocytes Absolute: 1.2 10*3/uL — ABNORMAL HIGH (ref 0.1–1.0)
Monocytes Relative: 10 %
Neutro Abs: 9.4 10*3/uL — ABNORMAL HIGH (ref 1.7–7.7)
Neutrophils Relative %: 77 %
Platelets: 171 10*3/uL (ref 150–400)
RBC: 4.04 MIL/uL (ref 3.87–5.11)
RDW: 13.2 % (ref 11.5–15.5)
WBC: 12 10*3/uL — ABNORMAL HIGH (ref 4.0–10.5)
nRBC: 0 % (ref 0.0–0.2)

## 2020-03-07 LAB — SODIUM
Sodium: 121 mmol/L — ABNORMAL LOW (ref 135–145)
Sodium: 118 mmol/L — CL (ref 135–145)
Sodium: 117 mmol/L — CL (ref 135–145)
Sodium: 117 mmol/L — CL (ref 135–145)
Sodium: 115 mmol/L — CL (ref 135–145)

## 2020-03-07 LAB — T3, FREE: T3, Free: 1.6 pg/mL — ABNORMAL LOW (ref 2.0–4.4)

## 2020-03-07 MED ORDER — AMLODIPINE BESYLATE 10 MG PO TABS
10.0000 mg | ORAL_TABLET | Freq: Every day | ORAL | Status: DC
  Administered 2020-03-08 – 2020-03-14 (×6): 10 mg via ORAL
  Filled 2020-03-07 (×7): qty 1

## 2020-03-07 MED ORDER — DEXTROSE 5 % IV SOLN: INTRAVENOUS | Status: DC

## 2020-03-07 MED ORDER — NON FORMULARY: 15.0000 g | Freq: Two times a day (BID) | Status: DC

## 2020-03-07 MED ORDER — UREA 15 G PO PACK
15.0000 g | PACK | Freq: Two times a day (BID) | ORAL | Status: DC
  Administered 2020-03-08 (×3): 15 g via ORAL
  Filled 2020-03-07 (×4): qty 1

## 2020-03-07 MED ORDER — DESMOPRESSIN ACETATE 4 MCG/ML IJ SOLN
2.0000 ug | INTRAMUSCULAR | Status: AC
  Administered 2020-03-07: 2 ug via INTRAVENOUS
  Filled 2020-03-07 (×2): qty 1

## 2020-03-07 MED ORDER — SODIUM CHLORIDE 3 % IV SOLN
INTRAVENOUS | Status: DC
  Filled 2020-03-07 (×2): qty 500

## 2020-03-07 NOTE — Progress Notes (Signed)
CRITICAL VALUE ALERT  Critical Value:  Sodium - 117  Date & Time Notied:  03/07/2020 @ 2030  Provider Notified: Night Provider - X. Blount  Orders Received/Actions taken: Awaiting instructions.

## 2020-03-07 NOTE — Progress Notes (Addendum)
Na still at 121, overcorrected. Increased D5 to 75cc/kg. Has yet to receive DDAVP 73mcg per [facility]?? Ordered at 645am. q2h Na checks. Full progress note to follow.  Gean Quint, MD [facility]

## 2020-03-07 NOTE — Progress Notes (Signed)
PROGRESS NOTE  Christy Wade BSJ:[id number] DOB: 06-02-1936 DOA: 03/05/2020 PCP: Ronnald Nian, DO  Brief History   The patient is a 84 yr old woman who presented to [facility] ED on 03/05/2020 after a fall. The patient began having nausea/vomiting/diarrhea on 03/01/2020. Her diarrhea subsided on 03/03/2020, but she has continued to have nausea and vomiting which has been bilious and non-bloody. She thought that it may have been caused by a taco from a local restaurant on 02/29/2020. She was seen at an urgent care and was given zofran and sent home. On 03/05/2020, the patient got out of bed, and fell. She was unable to get up on her own due to weakness. Of note, HCTZ was added to this patient's antihypertensive regimen on 02/29/2020. She did not hit her head.  In the ED the patient was found to be significantly hyponatremic at 111, acidotic with bicarb of 18 and anion gap of 18. She was also hypochloremic at 18, and her creatinine was elevated at 1.26. CXR demonstrated fracture of the 7th through ninth rib with prominent displacement. The patient has maintained oxygen saturations in the high 90's on room air.  [facility] were consulted to admit the patient for further evaluation and care. She has been admitted to a telemetry bed. Initially her hyponatremia was treated with NS. However, her sodium has dropped to 109. I have transferred the patient to the ICU, placed her on 3% NS at 30 cc/hr. TSH, FT4, Urine osmolality and sodium, and serum urea have been ordered. I appreciate nephrology assistance.  Consultants  . Nephrology  Procedures  . None  Antibiotics   Anti-infectives (From admission, onward)   Start     Dose/Rate Route Frequency Ordered Stop   03/06/20 1700  cefTRIAXone (ROCEPHIN) 1 g in sodium chloride 0.9 % 100 mL IVPB        1 g 200 mL/hr over 30 Minutes Intravenous Every 24 hours 03/06/20 1643       Subjective  The patient is awake, alert, and oriented x 3. She states that she  is feeling better.  Objective   Vitals:  Vitals:   03/07/20 1100 03/07/20 1200  BP: (!) 145/62 126/61  Pulse: 84 72  Resp: (!) 21 16  Temp:    SpO2: 100% 99%   Exam:  Constitutional:  . The patient is awake, alert, and oriented x 3. She appears elderly, cachectic, and frail. No acute distress. Respiratory:  . No increased work of breathing. . No wheezes, rales, or rhonchi . No tactile fremitus Cardiovascular:  . Regular rate and rhythm . No murmurs, ectopy, or gallups. . No lateral PMI. No thrills. Abdomen:  . Abdomen is soft, non-tender, non-distended . No hernias, masses, or organomegaly . Normoactive bowel sounds.  Musculoskeletal:  . No cyanosis, clubbing, or edema Skin:  . No rashes, lesions, ulcers . palpation of skin: no induration or nodules Neurologic:  . CN 2-12 intact . Sensation all 4 extremities intact Psychiatric:  . Mental status o Mood, affect appropriate o Orientation to person, place, time  . judgment and insight appear intact   I have personally reviewed the following:   Today's Data  . Vitals, BMP, CBC, Na.  Imaging  . CXR  Scheduled Meds: . acetaminophen  1,000 mg Oral Q8H  . amLODipine  5 mg Oral Daily  . Chlorhexidine Gluconate Cloth  6 each Topical Daily  . cycloSPORINE  1 drop Both Eyes BID  . enoxaparin (LOVENOX) injection  40 mg  Subcutaneous Q24H  . levothyroxine  50 mcg Oral Q0600   Continuous Infusions: . cefTRIAXone (ROCEPHIN)  IV Stopped (03/06/20 1914)  . dextrose 75 mL/hr at 03/07/20 1121    No problems updated.   LOS: 2 days   A & P   Hyponatremia: Multi-factorial (Volume depletion due to diarrhea/nausea/and vomiting + SIADH due to HCTZ+ pain due to rib fractures). Intermittent worsening since admission despite 9% NS. Sodium down to 109 this am. Patient was transferred to the ICU, given 3% NS at 30 cc/hr, and nephrology consulted. Urine sodium, osmolality, TSH, and Uric acid ordered. Unfortunately due to urinary  retention with subsequent placement of foley, the patient has corrected quickly. Fluids changed to d5 this morning. DDAVP ordered. Monitored closely.  N/V/D: Diarrhea has subsided, but the patient continues to complain of nausea. Stool studies are pending, but patient has not produced a stool. She is receiving a regular diet, but has poor intake.  Displaced fractures of right ribs 7-9 are seen on portable chest. Patient is showing no signs of respiratory distress currently, and oxygenation is not compromised. Pain will be addressed and the patient will be observed for signs of altered respiratory mechanics affecting her respiratory status. Otherwise plan to manage these fractures conservatively.  Separated right shoulder: Discussed with Dr. Richardson Dopp of ortho. I have ordered a sling and the patient may have weight bearing as tolerated on that shoulder.  AKI: Creatinine elevated to 1.25 on admission. It appears that her baseline creatinine is 1.1 consistent with CKD II. Continue IV fluids. Creatinine now is 0.90. Avoid nephrotoxic medications and hypotension. Monitor creatinine, electrolytes, and volume status.  Chronic chest wall pain: Noted. Complicates monitoring of acute pain from rib fractures.   Essential Hypertension: Blood pressures are a little high. Lisinopril held due to elevated creatinine. Will add Norvasc.  I have seen and examined this patient myself. I have spent 32 minutes in her evaluation and care.  DVT Prophylaxis: Lovenox CODE STATUS: Full Code Family Communication: none available Disposition:  Status is: Inpatient  Remains inpatient appropriate because:IV treatments appropriate due to intensity of illness or inability to take PO  Dispo: The patient is from: Home              Anticipated d/c is to: Home              Anticipated d/c date is: 3 days              Patient currently is not medically stable to d/c.  Geronimo Diliberto, DO [facility] Direct contact: see  [email or web address]  7PM-7AM contact night coverage as above 03/07/2020, 12:43 AM  LOS: 1 day         .

## 2020-03-07 NOTE — TOC Initial Note (Signed)
Transition of Care [facility]) - Initial/Assessment Note    Patient Details  Name: Christy Wade MRN: [id number] Date of Birth: 05-11-1936  Transition of Care [facility]) CM/SW Contact:    Leeroy Cha, RN Phone Number: 03/07/2020, 9:01 AM  Clinical Narrative:                 Widowed lives by self, multiple falls with la level of 111, na 120 at 0800. Iv rocephin, iv d5w at 50cc/hr. Following for progression and toc needs. Expected Discharge Plan: Home/Self Care Barriers to Discharge: Continued Medical Work up   Patient Goals and CMS Choice Patient states their goals for this hospitalization and ongoing recovery are:: to go back home CMS Medicare.gov Compare Post Acute Care list provided to:: Patient    Expected Discharge Plan and Services Expected Discharge Plan: Home/Self Care   Discharge Planning Services: CM Consult   Living arrangements for the past 2 months: Single Family Home                                      Prior Living Arrangements/Services Living arrangements for the past 2 months: Single Family Home Lives with:: Self                   Activities of Daily Living Home Assistive Devices/Equipment: Bedside commode/3-in-1, Cane (specify quad or straight), Eyeglasses, Other (Comment) (single point cane, tub/shower unit, walk-in shower) ADL Screening (condition at time of admission) Patient's cognitive ability adequate to safely complete daily activities?: Yes Is the patient deaf or have difficulty hearing?: No Does the patient have difficulty seeing, even when wearing glasses/contacts?: No Does the patient have difficulty concentrating, remembering, or making decisions?: No Patient able to express need for assistance with ADLs?: Yes Does the patient have difficulty dressing or bathing?: Yes Independently performs ADLs?: No Communication: Independent Dressing (OT): Needs assistance Is this a change from baseline?: Change from baseline, expected to  last >3 days Grooming: Needs assistance Is this a change from baseline?: Change from baseline, expected to last >3 days Feeding: Needs assistance Is this a change from baseline?: Change from baseline, expected to last >3 days Bathing: Needs assistance Is this a change from baseline?: Change from baseline, expected to last >3 days Toileting: Needs assistance Is this a change from baseline?: Change from baseline, expected to last >3days In/Out Bed: Needs assistance Is this a change from baseline?: Change from baseline, expected to last >3 days Walks in Home: Needs assistance Is this a change from baseline?: Change from baseline, expected to last >3 days Does the patient have difficulty walking or climbing stairs?: Yes (secondary to weakness) Weakness of Legs: Both Weakness of Arms/Hands: None  Permission Sought/Granted                  Emotional Assessment              Admission diagnosis:  Hyponatremia [E87.1] Closed fracture of multiple ribs of right side, initial encounter [S22.41XA] Patient Active Problem List   Diagnosis Date Noted  . Multiple fractures of ribs, right side, subsequent encounter for fracture with routine healing 03/06/2020  . Hyponatremia 03/05/2020  . Diarrhea 07/24/2018  . Vaginitis and vulvovaginitis 05/22/2016  . Well adult exam 10/01/2015  . Chronic chest wall pain 09/09/2014  . Mitral valve regurgitation 06/21/2012  . Essential hypertension 01/03/2008  . Osteopenia 03/28/2007  . Hypothyroidism 02/16/2007  PCP:  Ronnald Nian, DO Pharmacy:   [facility] [place], [address] [place] [address] Phone: [phone] Fax: [phone]     Social Determinants of Health (SDOH) Interventions    Readmission Risk Interventions No flowsheet data found.

## 2020-03-07 NOTE — Progress Notes (Signed)
Orthopedic Tech Progress Note Patient Details:  Christy Wade 1935-09-17 [id number]  Ortho Devices Type of Ortho Device: Shoulder immobilizer Ortho Device/Splint Location: replacement shoulder sling Ortho Device/Splint Interventions: Application   Post Interventions Patient Tolerated: Well Instructions Provided: Care of device   [place] Pink 03/07/2020, 1:53 PM

## 2020-03-07 NOTE — Progress Notes (Signed)
Brief note  - Na up to 121 on 0200 labs, overcorrected - had urinary retention overnight with Foley placed, 2.2L out per RN--> relief of retention accelerated free water excretion  - stop 3%, start D5W 50 mL/ hr and give 1 dose DDAVP - repeat RFP now, check q 2 hrs for 2-3 checks - d/w bedside RN- appreciate assistance  Madelon Lips MD [place] pgr [phone]

## 2020-03-07 NOTE — Progress Notes (Signed)
Occupational Therapy Evaluation  Patient with functional deficits listed below impacting safety and independence with self care. Patient requiring mod A to power up to standing and complete stand pivot to recliner. Patient with forward flexed posture with hand held assist on L side, difficulty backing up to chair and sits quickly with poor eccentric control before OT able to move leads/wires. Patient with limited activity tolerance and requiring increased assist from baseline with self care + functional transfers. Patient reports she has a sister that lives across the street that would not be able to provide 24/7 assist "she could come over some" and DTR in [place] that might. Currently recommending SNF rehab unless patient is able to arrange 24/7 supervision/assist at home with [facility] services.     03/07/20 1533  OT Visit Information  Last OT Received On 03/07/20  Assistance Needed +1  History of Present Illness 84 y.o. female with history of HTN, hypothyroidism and osteoporosis presenting after fall at home. Dx of hyponatremia, R 7-9th rib fractures and R shoulder AC joint offset suggesting separated shoulder  Precautions  Precautions Fall  Restrictions  Weight Bearing Restrictions No  Other Position/Activity Restrictions confirmed WBAT R UE  Home Living  Family/patient expects to be discharged to: Private residence  Living Arrangements Alone  Available Help at Discharge Family;Available 24 hours/day  Type of Home House  Home Access Stairs to enter  Entrance Stairs-Number of Steps 2 steps carport  Entrance Stairs-Rails  (grab bars)  Home Layout One level  Bathroom Shower/Tub Walk-in shower;Tub/shower unit  [employer] Handicapped height  Bathroom Accessibility Yes  How Accessible Accessible via Paramedic - 2 wheels;Cane - single point;Cane - quad;Grab bars - tub/shower  Additional Comments sister retired Therapist, sports across street, DTR in [place]   Prior  Function  Level of Independence Independent  Communication  Communication No difficulties  Pain Assessment  Pain Assessment Faces  Faces Pain Scale 4  Pain Location R flank and shoulder  Pain Descriptors / Indicators Sore  Pain Intervention(s) Limited activity within patient's tolerance;Premedicated before session  Cognition  Arousal/Alertness Awake/alert  Behavior During Therapy WFL for tasks assessed/performed  Overall Cognitive Status No family/caregiver present to determine baseline cognitive functioning  General Comments patient able to recall why she is in hospital, however perseverates on certain topics/repeats herself   Upper Extremity Assessment  Upper Extremity Assessment Generalized weakness  Lower Extremity Assessment  Lower Extremity Assessment Defer to PT evaluation  ADL  Overall ADL's  Needs assistance/impaired  Grooming Set up;Sitting  Upper Body Bathing Sitting;Minimal assistance  Lower Body Bathing Moderate assistance;Sitting/lateral leans;Sit to/from stand  Upper Body Dressing  Minimal assistance;Sitting  Upper Body Dressing Details (indicate cue type and reason) due to UE/rib pain  Lower Body Dressing Moderate assistance;Sitting/lateral leans;Sit to/from Retail buyer Moderate assistance;Stand-pivot;Cueing for safety;Cueing for sequencing;BSC  Toilet Transfer Details (indicate cue type and reason) to recliner, decreased stability, poor posture remaining flexed forward with difficulty correcting despite hand held assist on L side of patient   Toileting- Clothing Manipulation and Hygiene Maximal assistance;Sit to/from stand;Sitting/lateral lean  Functional mobility during ADLs Moderate assistance;Cueing for safety;Cueing for sequencing  General ADL Comments patient requiring increased assistance with self care due to pain, decreased strength, activity tolerance, safety  Vision- History  Baseline Vision/History Cataracts  Bed Mobility  Overal bed mobility  Needs Assistance  Bed Mobility Supine to Sit  Supine to sit [facility] elevated;Min assist  General bed mobility comments to elevate trunk upright, patient  leaning heavily onto L elbow initially   Transfers  Overall transfer level Needs assistance  Equipment used 1 person hand held assist  Transfers Sit to/from [employer] Transfers  Sit to Stand Mod assist  Stand pivot transfers Mod assist  General transfer comment mod A to power up to standing and to steady, patient staying in forward flexed posture having difficulty pivoting to recliner, poor eccentric control sitting quickly, poor safety  Balance  Overall balance assessment Needs assistance  Sitting-balance support Feet supported  Sitting balance-Leahy Scale Fair  Standing balance support Single extremity supported  Standing balance-Leahy Scale Poor  Standing balance comment modA  OT - End of Session  Activity Tolerance Patient tolerated treatment well  Patient left in chair;with call bell/phone within reach;with chair alarm set  Nurse Communication Mobility status  OT Assessment  OT Recommendation/Assessment Patient needs continued OT Services  OT Visit Diagnosis Other abnormalities of gait and mobility (R26.89);History of falling (Z91.81);Pain  Pain - Right/Left Right  Pain - part of body Shoulder (ribs)  OT Problem List Decreased activity tolerance;Impaired balance (sitting and/or standing);Decreased safety awareness;Pain  OT Plan  OT Frequency (ACUTE ONLY) Min 2X/week  OT Treatment/Interventions (ACUTE ONLY) Self-care/ADL training;Energy conservation;DME and/or AE instruction;Therapeutic activities;Patient/family education;Balance training  AM-PAC OT "6 Clicks" Daily Activity Outcome Measure (Version 2)  Help from another person eating meals? 3  Help from another person taking care of personal grooming? 3  Help from another person toileting, which includes using toliet, bedpan, or urinal? 2  Help from another person bathing  (including washing, rinsing, drying)? 2  Help from another person to put on and taking off regular upper body clothing? 3  Help from another person to put on and taking off regular lower body clothing? 2  6 Click Score 15  OT Recommendation  Follow Up Recommendations SNF;Supervision/Assistance - 24 hour (HH if patient can have 24/7 A at home (pt prefers home))  OT Equipment 3 in 1 bedside commode  Individuals Consulted  Consulted and Agree with Results and Recommendations Patient  Acute Rehab OT Goals  Patient Stated Goal get moving  OT Goal Formulation With patient  Time For Goal Achievement 03/21/20  Potential to Achieve Goals Good  OT Time Calculation  OT Start Time (ACUTE ONLY) 1016  OT Stop Time (ACUTE ONLY) 1049  OT Time Calculation (min) 33 min  OT Evaluation  $OT Eval Moderate Complexity 1 Mod  OT Treatments  $Self Care/Home Management  8-22 mins  Written Expression  Dominant Hand Right   Delbert Phenix OT OT pager: [phone]

## 2020-03-07 NOTE — Progress Notes (Addendum)
CRITICAL VALUE ALERT  Critical Value:  Sodium - 115  Date & Time Notied:  03/07/2020 @ 2215  Provider Notified: Night Provider - X. Blount  Orders Received/Actions taken: NaCl 3% @ 64mL/hr

## 2020-03-07 NOTE — Progress Notes (Signed)
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 Stop d5, on next check will  -serial neurochecks -check free t4 and t3 (tsh 8.6) -uric acid normal  AKI, improved -baseline cr seems to be around 1-1.1 -likely etiology is secondary to pre-renal injury from n/v/d -peak cr 1.5, monitor labs for now if not worsening then check renal ultrasound -recommend checking urine culture (pyuria and hematuria present) -hold RAAS inhibition for now -Monitor Daily I/Os, Daily weight  -Maintain MAP>65 for optimal renal perfusion.  -Agree with holding ACE-I, avoid further nephrotoxins including NSAIDS, Morphine.  Unless absolutely necessary, avoid CT with contrast and/or MRI with gadolinium.     Hypertension: -will increase norvasc to 10mg  daily  Non anion gap metabolic acidosis -likely secondary to diarrhea, expect this to improve provided her diarrhea improves -add sodium bicarb 650mg  tid if no improvement  Diabetes Mellitus Type 2: mgmt per primary service  Gean Quint, MD [facility] 03/07/2020, 4:11 PM   Subjective:   No complaints, patient reports feeling much better as compared to yesterday. Na up to 121(overcorrected). Started on d5 at 50cc/hr with no downtrend in Na then increased to 75mg /hr, now down to 118. Denies changes in vision, nausea/vomiting, headaches, episodes of confusion, parasthesias.   Objective:   BP (!) 146/64   Pulse 89   Temp 98.2 F (36.8 C) (Oral)   Resp (!) 29   Ht 5\' 7"  (1.702 m)   Wt 71.7 kg   SpO2 96%   BMI 24.76 kg/m   Intake/Output Summary (Last 24 hours) at 03/07/2020 1611 Last data filed at 03/07/2020 1121 Gross per 24 hour  Intake 1088.01 ml  Output 2370 ml  Net  -1281.99 ml   Weight change: 3.661 kg  Physical Exam: Gen:nad, comfortable, sitting up in chair CVS:s1s2, rrr Resp:cta bl, no w/r/r/c, unlabored, bl chest expansion VEL:FYBO, nt/nd Ext:no edema Neuro: speech clear and coherent, moves all ext spontaneously  Imaging: No results found.  Labs: BMET Recent Labs  Lab 03/06/20 0030 03/06/20 0728 03/06/20 1007 03/06/20 1007 03/06/20 1213 03/06/20 1536 03/06/20 1807 03/06/20 2036 03/07/20 0223 03/07/20 0719 03/07/20 1407  NA 111*   < > 111*   < > 110* 110* 111* 114* 121* 121*  120* 118*  K 4.3  --  4.0  --  4.2  --  4.0 3.9 3.9 3.8  --   CL 81*  --  82*  --  81*  --  84* 85* 88* 92*  --   CO2 18*  --  18*  --  19*  --  19* 18* 19* 18*  --   GLUCOSE 112*  --  105*  --  112*  --  110* 112* 94 107*  --   BUN 15  --  17  --  16  --  14 16 14 11   --   CREATININE 1.46*  --  1.24*  --  1.21*  --  1.09* 1.13* 1.05* 0.90  --   CALCIUM 8.1*  --  8.1*  --  8.0*  --  7.8* 8.1* 8.6* 7.9*  --   PHOS  --   --   --   --   --   --   --   --   --  2.2*  --    < > = values in this interval not displayed.   CBC Recent Labs  Lab 03/05/20 0913 03/06/20 0030 03/07/20 0223  WBC 16.6* 11.2* 12.0*  NEUTROABS  --   --  9.4*  HGB 13.7 12.1 12.3  HCT 38.3 34.6* 33.8*  MCV 84.9 86.5 83.7  PLT 256 198 171    Medications:    . acetaminophen  1,000 mg Oral Q8H  . amLODipine  5 mg Oral Daily  . Chlorhexidine Gluconate Cloth  6 each Topical Daily  . cycloSPORINE  1 drop Both Eyes BID  . enoxaparin (LOVENOX) injection  40 mg Subcutaneous Q24H  . levothyroxine  50 mcg Oral Q0600

## 2020-03-07 NOTE — Evaluation (Signed)
Physical Therapy Evaluation Patient Details Name: Christy Wade MRN: [id number] DOB: January 23, 1936 Today's Date: 03/07/2020   History of Present Illness  The patient is a 84 yr old woman who presented to [facility] ED on 03/05/2020 after a fall at home. The patient began having n/v/d on 03/01/2020. She was seen at an urgent care and was given zofran and sent home. On 03/05/2020, the patient got out of bed, and fell, denies hitting her head. She was unable to get up on her own due to weakness. Of note, HCTZ was added to this patient's antihypertensive regimen on 02/29/2020. Patient admitted for hypotnatremia and found toh ave Rt 7-9 rib fractures and Rt shoulder AC joint offset with seperated shoudler. PMH significant for HTN, hypothyroidism, osteoporosis, anemia.     Clinical Impression  Christy Wade is 84 y.o. female admitted with above HPI and diagnosis. Patient is currently limited by functional impairments below (see PT problem list). Patient lives alone and is independent at baseline. Patient is limited by generalized weakness, impaired balance, Rt UE restrictions, and poor activity tolerance. She currently requires min-mod assist for functional transfers and gait with SBQC. Patient will benefit from continued skilled PT interventions to address impairments and progress independence with mobility, recommending HHPT with 24/7 assist vs. SNF if 24/7 unavailable. Acute PT will follow and progress as able.     Follow Up Recommendations Supervision/Assistance - 24 hour;SNF;Home health PT (HHPT if pt has 24/7 assist/sup initially from family)    Equipment Recommendations  None recommended by PT    Recommendations for Other Services       Precautions / Restrictions Precautions Precautions: Fall Precaution Comments: Rt shoulder sling Required Braces or Orthoses: Sling Restrictions Weight Bearing Restrictions: No RUE Weight Bearing: Weight bearing as tolerated Other Position/Activity Restrictions:  confirmed WBAT R UE      Mobility  Bed Mobility Overal bed mobility: Needs Assistance Bed Mobility: Supine to Sit;Sit to Supine     Supine to sit: HOB elevated;Min assist Sit to supine: Mod assist   General bed mobility comments: assist to bring LE's off EOb and initiate raising trunk upright. Mod assit to lower trunk and lift bil LE's into bed.  Transfers Overall transfer level: Needs assistance Equipment used: 1 person hand held assist;Quad cane Transfers: Sit to/from Stand Sit to Stand: Min assist Stand pivot transfers: Mod assist       General transfer comment: Min assist to initaite power up and steady with rise to quad cane.   Ambulation/Gait Ambulation/Gait assistance: Mod assist;+2 safety/equipment Gait Distance (Feet): 5 Feet Assistive device: Quad cane Gait Pattern/deviations: Step-through pattern;Decreased stride length;Decreased step length - left;Decreased step length - right;Trunk flexed Gait velocity: decr   General Gait Details: cues and assist to manage [facility] for gait in room. Pt easily fatigued and returned after several small steps forwards took steps back to sit EOB. VSS throughout. Mod assis to steady.  Stairs            Wheelchair Mobility    Modified Rankin (Stroke Patients Only)       Balance Overall balance assessment: Needs assistance Sitting-balance support: Feet supported Sitting balance-Leahy Scale: Fair     Standing balance support: Single extremity supported Standing balance-Leahy Scale: Poor Standing balance comment: modA              Pertinent Vitals/Pain Pain Assessment: Faces Faces Pain Scale: Hurts a little bit Pain Location: R shoulder Pain Descriptors / Indicators: Sore;Discomfort Pain Intervention(s): Limited activity within patient's  tolerance;Monitored during session;Repositioned    Home Living Family/patient expects to be discharged to:: Private residence Living Arrangements: Alone Available Help at  Discharge: Family;Available 24 hours/day Type of Home: House Home Access: Stairs to enter Entrance Stairs-Rails:  (grab bars) Entrance Stairs-Number of Steps: 2 steps carport Home Layout: One level Home Equipment: Clinical cytogeneticist - 2 wheels;Cane - single point;Cane - quad;Grab bars - tub/shower Additional Comments: sister retired Therapist, sports across street, Daughter lives in [place]     Prior Function Level of Independence: Independent            Journalist, newspaper   Dominant Hand: Right    Extremity/Trunk Assessment   Upper Extremity Assessment Upper Extremity Assessment: Defer to OT evaluation    Lower Extremity Assessment Lower Extremity Assessment: Generalized weakness    Cervical / Trunk Assessment Cervical / Trunk Assessment: Kyphotic  Communication   Communication: No difficulties  Cognition Arousal/Alertness: Awake/alert Behavior During Therapy: WFL for tasks assessed/performed Overall Cognitive Status: No family/caregiver present to determine baseline cognitive functioning            General Comments: patient able to recall why she is in hospital, however perseverates on certain topics/repeats herself       General Comments      Exercises     Assessment/Plan    PT Assessment Patient needs continued PT services  PT Problem List Decreased strength;Decreased range of motion;Decreased activity tolerance;Decreased mobility;Decreased balance;Decreased cognition;Decreased knowledge of use of DME;Decreased knowledge of precautions       PT Treatment Interventions DME instruction;Stair training;Gait training;Functional mobility training;Therapeutic activities;Therapeutic exercise;Balance training;Patient/family education    PT Goals (Current goals can be found in the Care Plan section)  Acute Rehab PT Goals Patient Stated Goal: get back home PT Goal Formulation: With patient Time For Goal Achievement: 03/21/20 Potential to Achieve Goals: Good    Frequency  Min 3X/week   Barriers to discharge Decreased caregiver support unclear assisastance available       AM-PAC PT "6 Clicks" Mobility  Outcome Measure Help needed turning from your back to your side while in a flat bed without using bedrails?: A Lot Help needed moving from lying on your back to sitting on the side of a flat bed without using bedrails?: A Lot Help needed moving to and from a bed to a chair (including a wheelchair)?: A Lot Help needed standing up from a chair using your arms (e.g., wheelchair or bedside chair)?: A Little Help needed to walk in hospital room?: A Lot Help needed climbing 3-5 steps with a railing? : A Lot 6 Click Score: 13    End of Session Equipment Utilized During Treatment: Gait belt;Other (comment) (Rt UE sling) Activity Tolerance: Patient tolerated treatment well Patient left: in bed;with call bell/phone within reach;with bed alarm set Nurse Communication: Mobility status PT Visit Diagnosis: Muscle weakness (generalized) (M62.81);Other abnormalities of gait and mobility (R26.89);Unsteadiness on feet (R26.81);Difficulty in walking, not elsewhere classified (R26.2)    Time: 5784-6962 PT Time Calculation (min) (ACUTE ONLY): 24 min   Charges:   PT Evaluation $PT Eval Moderate Complexity: 1 Mod PT Treatments $Therapeutic Activity: 8-22 mins       Verner Mould, DPT Acute Rehabilitation Services  Office [phone] Pager [phone]  03/07/2020 7:08 PM

## 2020-03-07 NOTE — Progress Notes (Signed)
Labs reviewed. Na back down to 117 which is reasonable given that she overcorrected earlier. Would monitor for now and see if she will correct herself. Ure-Na 15g BID ordered (nonformulary) to help with support. If Na downtrends further then recommend restarted 3% NS at 20cc/hr to start with. Continue neurocheck. Continue to monitor Na every 2 hours. See progress note from today for further details  Gean Quint, MD [place]

## 2020-03-08 LAB — SODIUM
Sodium: 116 mmol/L — CL (ref 135–145)
Sodium: 118 mmol/L — CL (ref 135–145)
Sodium: 118 mmol/L — CL (ref 135–145)
Sodium: 118 mmol/L — CL (ref 135–145)
Sodium: 119 mmol/L — CL (ref 135–145)
Sodium: 119 mmol/L — CL (ref 135–145)
Sodium: 119 mmol/L — CL (ref 135–145)
Sodium: 120 mmol/L — ABNORMAL LOW (ref 135–145)
Sodium: 122 mmol/L — ABNORMAL LOW (ref 135–145)

## 2020-03-08 LAB — BASIC METABOLIC PANEL
Anion gap: 10 (ref 5–15)
BUN: 21 mg/dL (ref 8–23)
CO2: 19 mmol/L — ABNORMAL LOW (ref 22–32)
Calcium: 7.9 mg/dL — ABNORMAL LOW (ref 8.9–10.3)
Chloride: 89 mmol/L — ABNORMAL LOW (ref 98–111)
Creatinine, Ser: 0.86 mg/dL (ref 0.44–1.00)
GFR calc Af Amer: >60 mL/min (ref >60)
GFR calc non Af Amer: >60 mL/min (ref >60)
Glucose, Bld: 117 mg/dL — ABNORMAL HIGH (ref 70–99)
Potassium: 3.5 mmol/L (ref 3.5–5.1)
Sodium: 118 mmol/L — CL (ref 135–145)

## 2020-03-08 LAB — CBC WITH DIFFERENTIAL/PLATELET
Abs Immature Granulocytes: 0.08 10*3/uL — ABNORMAL HIGH (ref 0.00–0.07)
Basophils Absolute: 0 10*3/uL (ref 0.0–0.1)
Basophils Relative: 0 %
Eosinophils Absolute: 0 10*3/uL (ref 0.0–0.5)
Eosinophils Relative: 0 %
HCT: 33.2 % — ABNORMAL LOW (ref 36.0–46.0)
Hemoglobin: 11.5 g/dL — ABNORMAL LOW (ref 12.0–15.0)
Immature Granulocytes: 1 %
Lymphocytes Relative: 8 %
Lymphs Abs: 1 10*3/uL (ref 0.7–4.0)
MCH: 30.3 pg (ref 26.0–34.0)
MCHC: 34.6 g/dL (ref 30.0–36.0)
MCV: 87.6 fL (ref 80.0–100.0)
Monocytes Absolute: 1.1 10*3/uL — ABNORMAL HIGH (ref 0.1–1.0)
Monocytes Relative: 8 %
Neutro Abs: 10.8 10*3/uL — ABNORMAL HIGH (ref 1.7–7.7)
Neutrophils Relative %: 83 %
Platelets: 146 10*3/uL — ABNORMAL LOW (ref 150–400)
RBC: 3.79 MIL/uL — ABNORMAL LOW (ref 3.87–5.11)
RDW: 13.6 % (ref 11.5–15.5)
WBC: 13 10*3/uL — ABNORMAL HIGH (ref 4.0–10.5)
nRBC: 0 % (ref 0.0–0.2)

## 2020-03-08 NOTE — Progress Notes (Signed)
CRITICAL VALUE ALERT  Critical Value:  Sodium - 116  Date & Time Notied:  03/08/2020 @ 0020  Provider Notified: Night Provider - X. Blount  Orders Received/Actions taken: Awaiting instructions.

## 2020-03-08 NOTE — Progress Notes (Signed)
PROGRESS NOTE  LEMA HEINKEL EQA:[id number] DOB: 1936/04/20 DOA: 03/05/2020 PCP: Ronnald Nian, DO  Brief History   The patient is a 84 yr old woman who presented to [facility] ED on 03/05/2020 after a fall. The patient began having nausea/vomiting/diarrhea on 03/01/2020. Her diarrhea subsided on 03/03/2020, but she has continued to have nausea and vomiting which has been bilious and non-bloody. She thought that it may have been caused by a taco from a local restaurant on 02/29/2020. She was seen at an urgent care and was given zofran and sent home. On 03/05/2020, the patient got out of bed, and fell. She was unable to get up on her own due to weakness. Of note, HCTZ was added to this patient's antihypertensive regimen on 02/29/2020. She did not hit her head.  In the ED the patient was found to be significantly hyponatremic at 111, acidotic with bicarb of 18 and anion gap of 18. She was also hypochloremic at 18, and her creatinine was elevated at 1.26. CXR demonstrated fracture of the 7th through ninth rib with prominent displacement. The patient has maintained oxygen saturations in the high 90's on room air.  [facility] were consulted to admit the patient for further evaluation and care. She has been admitted to a telemetry bed. Initially her hyponatremia was treated with NS. However, her sodium has dropped to 109. I have transferred the patient to the ICU, placed her on 3% NS at 30 cc/hr. TSH, FT4, Urine osmolality and sodium, and serum urea have been ordered. I appreciate nephrology assistance.  Consultants   Nephrology  Procedures   None  Antibiotics   Anti-infectives (From admission, onward)   Start     Dose/Rate Route Frequency Ordered Stop   03/06/20 1700  cefTRIAXone (ROCEPHIN) 1 g in sodium chloride 0.9 % 100 mL IVPB        1 g 200 mL/hr over 30 Minutes Intravenous Every 24 hours 03/06/20 1643       Subjective  The patient is awake, alert, and oriented x 3. She appears weak  today. No new complaints.  Objective   Vitals:  Vitals:   03/08/20 0817 03/08/20 0900  BP:  133/67  Pulse:  65  Resp:  13  Temp: 98.6 F (37 C)   SpO2:  99%   Exam:  Constitutional:   The patient is awake, alert, and oriented x 3. She appears elderly, cachectic, and frail. No acute distress. Respiratory:   No increased work of breathing.  No wheezes, rales, or rhonchi  No tactile fremitus Cardiovascular:   Regular rate and rhythm  No murmurs, ectopy, or gallups.  No lateral PMI. No thrills. Abdomen:   Abdomen is soft, non-tender, non-distended  No hernias, masses, or organomegaly  Normoactive bowel sounds.  Musculoskeletal:   No cyanosis, clubbing, or edema Skin:   No rashes, lesions, ulcers  palpation of skin: no induration or nodules Neurologic:   CN 2-12 intact  Sensation all 4 extremities intact Psychiatric:   Mental status o Mood, affect appropriate o Orientation to person, place, time   judgment and insight appear intact   I have personally reviewed the following:   Today's Data   Vitals, BMP, CBC, Na.  Imaging   CXR  Scheduled Meds:  acetaminophen  1,000 mg Oral Q8H   amLODipine  10 mg Oral Daily   Chlorhexidine Gluconate Cloth  6 each Topical Daily   cycloSPORINE  1 drop Both Eyes BID   enoxaparin (LOVENOX) injection  40  mg Subcutaneous Q24H   levothyroxine  50 mcg Oral Q0600   Urea  15 g Oral BID   Continuous Infusions:  cefTRIAXone (ROCEPHIN)  IV 1 g (03/07/20 1739)   sodium chloride (hypertonic) 20 mL/hr at 03/07/20 2302    No problems updated.   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 Sodium 114 on admission. Declined to 109 by the next morning. Patient transferred to ICU and nephrology consulted. She is back on 3% NaCl this morning after small decline in sodium overnight. 118 this morning.  Na is followed every 2 hours. I appreciate nephrology's assistance.  N/V/D: Diarrhea has subsided, as has nausea and vomiting. The patient reports that she had a normal BM this morning. Stool studies are pending, but patient has not produced a stool. She is receiving a regular diet, but has poor intake.  Displaced fractures of right ribs 7-9 are seen on portable chest. Patient is showing no signs of respiratory distress currently, and oxygenation is not compromised. Pain will be addressed and the patient will be observed for signs of altered respiratory mechanics affecting her respiratory status. Otherwise plan to manage these fractures conservatively.  Dysuria: Urinalysis was positive for UTI. Urine culture has grown out 100K CFU of GNR. ID and susceptibilities are pending. The patient is receiving IV Rocephin.   Separated right shoulder: Discussed with Dr. Richardson Dopp of ortho. I have ordered a sling and the patient may have weight bearing as tolerated on that shoulder.  AKI: Creatinine elevated to 1.25 on admission. It appears that her baseline creatinine is 1.1 consistent with CKD II. Continue IV fluids. Creatinine now is 0.90. Avoid nephrotoxic medications and hypotension. Monitor creatinine, electrolytes, and volume status.  Chronic chest wall pain: Noted. Complicates monitoring of acute pain from rib fractures.   Essential Hypertension: Blood pressures are a little high. Lisinopril held due to elevated creatinine. Will add Norvasc.  I have seen and examined this patient myself. I have spent 30 minutes in her evaluation and care.  DVT Prophylaxis: Lovenox CODE STATUS: Full Code Family Communication: none available Disposition:  Status is: Inpatient  Remains inpatient appropriate because:IV treatments appropriate due to intensity of illness or inability to take PO  Dispo: The patient is from: Home              Anticipated d/c is to: Home              Anticipated d/c date is: 3 days               Patient currently is not medically stable to d/c.  Breshae Belcher, DO [facility] Direct contact: see [email or web address]  7PM-7AM contact night coverage as above 03/08/2020, 11:34 AM  LOS: 1 day         .

## 2020-03-08 NOTE — Progress Notes (Addendum)
CRITICAL VALUE ALERT  Critical Value: Na+ 119  Date & Time Notied:  03/08/20  Provider Notified: [facility]  Orders Received/Actions taken: continue to run ordered fluids

## 2020-03-08 NOTE — Progress Notes (Signed)
Physical Therapy Treatment Patient Details Name: Christy Wade MRN: [id number] DOB: [birth year] Today's Date: 03/08/2020    History of Present Illness The patient is a 84 yr old woman who presented to [facility] ED on 03/05/2020 after a fall at home. The patient began having n/v/d on 03/01/2020. She was seen at an urgent care and was given zofran and sent home. On 03/05/2020, the patient got out of bed, and fell, denies hitting her head. She was unable to get up on her own due to weakness. Of note, HCTZ was added to this patient's antihypertensive regimen on 02/29/2020. Patient admitted for hypotnatremia and found toh ave Rt 7-9 rib fractures and Rt shoulder AC joint offset with seperated shoudler. PMH significant for HTN, hypothyroidism, osteoporosis, anemia.     PT Comments    Pt in good spirits and with marked improvement in activity tolerance.  Pt up to ambulate in halls with assist - pt with L hand on RW for stability and with PT assist to manage RW.   Follow Up Recommendations  Supervision/Assistance - 24 hour;SNF;Home health PT     Equipment Recommendations  None recommended by PT    Recommendations for Other Services       Precautions / Restrictions Precautions Precautions: Fall Precaution Comments: Rt shoulder sling Required Braces or Orthoses: Sling Restrictions Weight Bearing Restrictions: No RUE Weight Bearing: Weight bearing as tolerated Other Position/Activity Restrictions: confirmed WBAT R UE    Mobility  Bed Mobility               General bed mobility comments: Pt up in chair with nursing and requests back to same  Transfers Overall transfer level: Needs assistance Equipment used: Rolling walker (2 wheeled) Transfers: Sit to/from Stand Sit to Stand: Min assist;Mod assist         General transfer comment: cues for transition position and use of L UE to self assist.  Min/mod assist to bring wt up and fwd from low recliner and to balance in initial  standing  Ambulation/Gait Ambulation/Gait assistance: Min assist;Mod assist Gait Distance (Feet): 200 Feet Assistive device: Rolling walker (2 wheeled) Gait Pattern/deviations: Step-through pattern;Decreased stride length;Decreased step length - left;Decreased step length - right;Trunk flexed Gait velocity: decr   General Gait Details: cues for posture and position from RW;  Assist for stability and to manage RW;  Pt with L hand only on RW (R UE slinged) but with marked improvement in stability vs cane.   Stairs             Wheelchair Mobility    Modified Rankin (Stroke Patients Only)       Balance Overall balance assessment: Needs assistance Sitting-balance support: Feet supported Sitting balance-Leahy Scale: Good     Standing balance support: Single extremity supported Standing balance-Leahy Scale: Poor                              Cognition Arousal/Alertness: Awake/alert Behavior During Therapy: WFL for tasks assessed/performed Overall Cognitive Status: No family/caregiver present to determine baseline cognitive functioning                                 General Comments: Pt very agreeable and eager to mobilize       Exercises      General Comments        Pertinent Vitals/Pain Pain Assessment: Faces Faces Pain  Scale: Hurts a little bit Pain Location: R shoulder Pain Descriptors / Indicators: Sore;Discomfort Pain Intervention(s): Limited activity within patient's tolerance;Monitored during session    Home Living                      Prior Function            PT Goals (current goals can now be found in the care plan section) Acute Rehab PT Goals Patient Stated Goal: get back home PT Goal Formulation: With patient Time For Goal Achievement: 03/21/20 Potential to Achieve Goals: Good Progress towards PT goals: Progressing toward goals    Frequency    Min 3X/week      PT Plan Current plan remains  appropriate    Co-evaluation              AM-PAC PT "6 Clicks" Mobility   Outcome Measure  Help needed turning from your back to your side while in a flat bed without using bedrails?: A Lot Help needed moving from lying on your back to sitting on the side of a flat bed without using bedrails?: A Lot Help needed moving to and from a bed to a chair (including a wheelchair)?: A Lot Help needed standing up from a chair using your arms (e.g., wheelchair or bedside chair)?: A Little Help needed to walk in hospital room?: A Little Help needed climbing 3-5 steps with a railing? : A Lot 6 Click Score: 14    End of Session Equipment Utilized During Treatment: Gait belt;Other (comment) (R UE sling) Activity Tolerance: Patient tolerated treatment well Patient left: in chair;with call bell/phone within reach;with chair alarm set Nurse Communication: Mobility status PT Visit Diagnosis: Muscle weakness (generalized) (M62.81);Other abnormalities of gait and mobility (R26.89);Unsteadiness on feet (R26.81);Difficulty in walking, not elsewhere classified (R26.2)     Time: 1423-9532 PT Time Calculation (min) (ACUTE ONLY): 29 min  Charges:  $Gait Training: 23-37 mins                     [place] Pager [phone] Office [phone]    [facility] 03/08/2020, 4:53 PM

## 2020-03-08 NOTE — Progress Notes (Signed)
[facility] Progress Note    Assessment/ Plan:   Severe Hyponatremia: - secondary to HCTZ/ diarrhea/ n/v, suspecting a mixed picture of hypovolemic hyponatremia and SIADH -asymptomatic -na stable at 118 -3%NS at 20cc/hr, will maintain this rate -started ure-na 15g bid -continue to check Na q2h, currently there is a Na pending, if better then can check q4h -goal Na in the next 24 hrs ~124 -encouraged solute intake, would not restrict Na in diet for now -serial neurochecks -check free t4 and t3 (tsh 8.6) -uric acid normal  AKI, improved -baseline cr seems to be around 1-1.1 -likely etiology is secondary to pre-renal injury from n/v/d -peak cr 1.5, monitor labs for now if worsening then check renal ultrasound -hold RAAS inhibition for now -Monitor Daily I/Os, Daily weight  -Maintain MAP>65 for optimal renal perfusion.  -Agree with holding ACE-I, avoid further nephrotoxins including NSAIDS, Morphine.  Unless absolutely necessary, avoid CT with contrast and/or MRI with gadolinium.     Klebsiella pneumoniae UTI -started on rocephin  Leukocytosis -+urine cx as above, on abx  Hypertension, improved: -norvasc to 10mg  daily  Non anion gap metabolic acidosis, stable -likely secondary to diarrhea, expect this to improve provided her diarrhea improves -add sodium bicarb 650mg  tid if no improvement. Would hold off for now  Diabetes Mellitus Type 2: mgmt per primary service  Gean Quint, MD [place] 03/07/2020, 4:11 PM   Subjective:   No complaints, started on 3% at 20cc/hr. Started on ure-na 15g bid. No acute events   Objective:   BP 133/67   Pulse 65   Temp 98.6 F (37 C) (Oral)   Resp 13   Ht 5\' 7"  (1.702 m)   Wt 71.7 kg   SpO2 99%   BMI 24.76 kg/m   Intake/Output Summary (Last 24 hours) at 03/08/2020 1325 Last data filed at 03/08/2020 6629 Gross per 24 hour  Intake 571.26 ml  Output 400 ml  Net 171.26 ml   Weight change:    Physical Exam: Gen:nad, comfortable, sitting up in chair CVS:s1s2, rrr Resp:cta bl, no w/r/r/c, unlabored, bl chest expansion UTM:LYYT, nt/nd Ext:no edema Neuro: speech clear and coherent, moves all ext spontaneously  Imaging: No results found.  Labs: BMET Recent Labs  Lab 03/06/20 1007 03/06/20 1007 03/06/20 1213 03/06/20 1536 03/06/20 1807 03/06/20 1807 03/06/20 2036 03/06/20 2036 03/07/20 0223 03/07/20 0223 03/07/20 0719 03/07/20 1407 03/07/20 1942 03/07/20 2127 03/07/20 2344 03/08/20 0233 03/08/20 0520 03/08/20 0808 03/08/20 1016  NA 111*   < > 110*   < > 111*   < > 114*   < > 121*   < > 121*  120*   < > 117* 115* 116* 118* 118* 119* 118*  K 4.0  --  4.2  --  4.0  --  3.9  --  3.9  --  3.8  --   --   --   --   --  3.5  --   --   CL 82*  --  81*  --  84*  --  85*  --  88*  --  92*  --   --   --   --   --  89*  --   --   CO2 18*  --  19*  --  19*  --  18*  --  19*  --  18*  --   --   --   --   --  19*  --   --  GLUCOSE 105*  --  112*  --  110*  --  112*  --  94  --  107*  --   --   --   --   --  117*  --   --   BUN 17  --  16  --  14  --  16  --  14  --  11  --   --   --   --   --  21  --   --   CREATININE 1.24*  --  1.21*  --  1.09*  --  1.13*  --  1.05*  --  0.90  --   --   --   --   --  0.86  --   --   CALCIUM 8.1*  --  8.0*  --  7.8*  --  8.1*  --  8.6*  --  7.9*  --   --   --   --   --  7.9*  --   --   PHOS  --   --   --   --   --   --   --   --   --   --  2.2*  --   --   --   --   --   --   --   --    < > = values in this interval not displayed.   CBC Recent Labs  Lab 03/05/20 0913 03/06/20 0030 03/07/20 0223 03/08/20 0520  WBC 16.6* 11.2* 12.0* 13.0*  NEUTROABS  --   --  9.4* 10.8*  HGB 13.7 12.1 12.3 11.5*  HCT 38.3 34.6* 33.8* 33.2*  MCV 84.9 86.5 83.7 87.6  PLT 256 198 171 146*    Medications:    . acetaminophen  1,000 mg Oral Q8H  . amLODipine  10 mg Oral Daily  . Chlorhexidine Gluconate Cloth  6 each Topical Daily  . cycloSPORINE  1  drop Both Eyes BID  . enoxaparin (LOVENOX) injection  40 mg Subcutaneous Q24H  . levothyroxine  50 mcg Oral Q0600  . Urea  15 g Oral BID

## 2020-03-09 ENCOUNTER — Other Ambulatory Visit: Payer: Self-pay

## 2020-03-09 LAB — CBC WITH DIFFERENTIAL/PLATELET
Abs Immature Granulocytes: 0.04 10*3/uL (ref 0.00–0.07)
Basophils Absolute: 0 10*3/uL (ref 0.0–0.1)
Basophils Relative: 0 %
Eosinophils Absolute: 0.1 10*3/uL (ref 0.0–0.5)
Eosinophils Relative: 1 %
HCT: 29.8 % — ABNORMAL LOW (ref 36.0–46.0)
Hemoglobin: 10.5 g/dL — ABNORMAL LOW (ref 12.0–15.0)
Immature Granulocytes: 0 %
Lymphocytes Relative: 16 %
Lymphs Abs: 1.6 10*3/uL (ref 0.7–4.0)
MCH: 30.7 pg (ref 26.0–34.0)
MCHC: 35.2 g/dL (ref 30.0–36.0)
MCV: 87.1 fL (ref 80.0–100.0)
Monocytes Absolute: 1.3 10*3/uL — ABNORMAL HIGH (ref 0.1–1.0)
Monocytes Relative: 12 %
Neutro Abs: 7.5 10*3/uL (ref 1.7–7.7)
Neutrophils Relative %: 71 %
Platelets: 188 10*3/uL (ref 150–400)
RBC: 3.42 MIL/uL — ABNORMAL LOW (ref 3.87–5.11)
RDW: 13.9 % (ref 11.5–15.5)
WBC: 10.5 10*3/uL (ref 4.0–10.5)
nRBC: 0 % (ref 0.0–0.2)

## 2020-03-09 LAB — BASIC METABOLIC PANEL
Anion gap: 8 (ref 5–15)
Anion gap: 8 (ref 5–15)
BUN: 17 mg/dL (ref 8–23)
BUN: 21 mg/dL (ref 8–23)
CO2: 19 mmol/L — ABNORMAL LOW (ref 22–32)
CO2: 20 mmol/L — ABNORMAL LOW (ref 22–32)
Calcium: 7.7 mg/dL — ABNORMAL LOW (ref 8.9–10.3)
Calcium: 7.7 mg/dL — ABNORMAL LOW (ref 8.9–10.3)
Chloride: 96 mmol/L — ABNORMAL LOW (ref 98–111)
Chloride: 99 mmol/L (ref 98–111)
Creatinine, Ser: 0.74 mg/dL (ref 0.44–1.00)
Creatinine, Ser: 0.91 mg/dL (ref 0.44–1.00)
GFR calc Af Amer: >60 mL/min (ref >60)
GFR calc Af Amer: >60 mL/min (ref >60)
GFR calc non Af Amer: >60 mL/min (ref >60)
GFR calc non Af Amer: 59 mL/min — ABNORMAL LOW (ref >60)
Glucose, Bld: 102 mg/dL — ABNORMAL HIGH (ref 70–99)
Glucose, Bld: 142 mg/dL — ABNORMAL HIGH (ref 70–99)
Potassium: 2.8 mmol/L — ABNORMAL LOW (ref 3.5–5.1)
Potassium: 3.5 mmol/L (ref 3.5–5.1)
Sodium: 123 mmol/L — ABNORMAL LOW (ref 135–145)
Sodium: 127 mmol/L — ABNORMAL LOW (ref 135–145)

## 2020-03-09 LAB — URINE CULTURE: Culture: >=100000 — AB

## 2020-03-09 LAB — SODIUM
Sodium: 121 mmol/L — ABNORMAL LOW (ref 135–145)
Sodium: 124 mmol/L — ABNORMAL LOW (ref 135–145)
Sodium: 122 mmol/L — ABNORMAL LOW (ref 135–145)
Sodium: 124 mmol/L — ABNORMAL LOW (ref 135–145)
Sodium: 127 mmol/L — ABNORMAL LOW (ref 135–145)
Sodium: 126 mmol/L — ABNORMAL LOW (ref 135–145)

## 2020-03-09 MED ORDER — POTASSIUM CHLORIDE 20 MEQ PO PACK
40.0000 meq | PACK | Freq: Once | ORAL | Status: AC
  Administered 2020-03-09: 40 meq via ORAL
  Filled 2020-03-09: qty 2

## 2020-03-09 MED ORDER — UREA 15 G PO PACK
30.0000 g | PACK | Freq: Two times a day (BID) | ORAL | Status: DC
  Administered 2020-03-09 – 2020-03-10 (×3): 30 g via ORAL
  Filled 2020-03-09 (×3): qty 2

## 2020-03-09 MED ORDER — POTASSIUM CHLORIDE 20 MEQ PO PACK
60.0000 meq | PACK | Freq: Once | ORAL | Status: AC
  Administered 2020-03-09: 60 meq via ORAL
  Filled 2020-03-09: qty 3

## 2020-03-09 MED ORDER — HYDRALAZINE HCL 25 MG PO TABS
25.0000 mg | ORAL_TABLET | Freq: Three times a day (TID) | ORAL | Status: DC
  Administered 2020-03-09 – 2020-03-14 (×15): 25 mg via ORAL
  Filled 2020-03-09 (×15): qty 1

## 2020-03-09 NOTE — Progress Notes (Addendum)
[name] Progress Note    Assessment/ Plan:   Severe Hyponatremia, improving--now moderate hyponatremia: - secondary to HCTZ/ diarrhea/ n/v, suspecting a mixed picture of hypovolemic hyponatremia and SIADH -asymptomatic -na improved to 127 now (123-124 this am, which was her goal).  Moving forward goal sodium should be around 129-131 by tomorrow morning. -Can check labs every 4-6 hours.  If further improvement can back off on this and moved to either every 8 to every 12 hours. -off 3% now -on ure-na 30g bid -encouraged solute intake, would not restrict Na in diet for now -fluid restrict 1L/day -Monitor potassium closely replete as needed, as below -serial neurochecks -check free t4 and t3 (tsh 8.6) -uric acid normal  Hypokalemia -Ordered potassium chloride 60 mEq this a.m. -Would aim for potassium greater than 4 as this will only help her hyponatremia  AKI, improved/resovled -baseline cr seems to be around 1-1.1 -likely etiology is secondary to pre-renal injury from n/v/d -peak cr 1.5, monitor labs for now if worsening then check renal ultrasound -hold RAAS inhibition for now -Monitor Daily I/Os, Daily weight  -Maintain MAP>65 for optimal renal perfusion.  -Agree with holding ACE-I, avoid further nephrotoxins including NSAIDS, Morphine.  Unless absolutely necessary, avoid CT with contrast and/or MRI with gadolinium.     Klebsiella pneumoniae UTI -started on rocephin  Leukocytosis -+urine cx as above, on abx  Hypertension, improved: -norvasc to 10mg  daily -Overall blood pressures are fluctuant's  Non anion gap metabolic acidosis, stable -likely secondary to diarrhea, expect this to improve provided her diarrhea improves -add sodium bicarb 650mg  tid if no improvement. Would hold off for now  Diabetes Mellitus Type 2: mgmt per primary service  Christy Quint, MD [facility]  Subjective:   No complaints currently.  Off 3%.  Had some  pain this morning when being cleaned up and changed but now resolved with pain medication.  No new symptoms   Objective:   BP 134/70   Pulse 67   Temp (!) 96.7 F (35.9 C) (Axillary)   Resp 12   Ht 5\' 7"  (1.702 m)   Wt 71.7 kg   SpO2 98%   BMI 24.76 kg/m   Intake/Output Summary (Last 24 hours) at 03/09/2020 1414 Last data filed at 03/09/2020 4259 Gross per 24 hour  Intake 349 ml  Output 1175 ml  Net -826 ml   Weight change:   Physical Exam: Gen:nad, comfortable, sitting up in chair CVS:s1s2, rrr Resp:cta bl, no w/r/r/c, unlabored, bl chest expansion DGL:OVFI, nt/nd Ext:no edema Neuro: speech clear and coherent, moves all ext spontaneously  Imaging: No results found.  Labs: BMET Recent Labs  Lab 03/06/20 1213 03/06/20 1536 03/06/20 1807 03/06/20 1807 03/06/20 2036 03/06/20 2036 03/07/20 0223 03/07/20 0223 03/07/20 0719 03/07/20 1407 03/08/20 0520 03/08/20 4332 03/08/20 2233 03/09/20 0216 03/09/20 0437 03/09/20 0628 03/09/20 0726 03/09/20 1002 03/09/20 1203  NA 110*   < > 111*   < > 114*   < > 121*   < > 121*  120*   < > 118*   < > 122* 121* 124* 123* 122* 124* 127*  K 4.2  --  4.0  --  3.9  --  3.9  --  3.8  --  3.5  --   --   --   --  2.8*  --   --   --   CL 81*  --  84*  --  85*  --  88*  --  92*  --  89*  --   --   --   --  96*  --   --   --   CO2 19*  --  19*  --  18*  --  19*  --  18*  --  19*  --   --   --   --  19*  --   --   --   GLUCOSE 112*  --  110*  --  112*  --  94  --  107*  --  117*  --   --   --   --  102*  --   --   --   BUN 16  --  14  --  16  --  14  --  11  --  21  --   --   --   --  17  --   --   --   CREATININE 1.21*  --  1.09*  --  1.13*  --  1.05*  --  0.90  --  0.86  --   --   --   --  0.74  --   --   --   CALCIUM 8.0*  --  7.8*  --  8.1*  --  8.6*  --  7.9*  --  7.9*  --   --   --   --  7.7*  --   --   --   PHOS  --   --   --   --   --   --   --   --  2.2*  --   --   --   --   --   --   --   --   --   --    < > = values in this  interval not displayed.   CBC Recent Labs  Lab 03/06/20 0030 03/07/20 0223 03/08/20 0520 03/09/20 0628  WBC 11.2* 12.0* 13.0* 10.5  NEUTROABS  --  9.4* 10.8* 7.5  HGB 12.1 12.3 11.5* 10.5*  HCT 34.6* 33.8* 33.2* 29.8*  MCV 86.5 83.7 87.6 87.1  PLT 198 171 146* 188    Medications:    . acetaminophen  1,000 mg Oral Q8H  . amLODipine  10 mg Oral Daily  . Chlorhexidine Gluconate Cloth  6 each Topical Daily  . cycloSPORINE  1 drop Both Eyes BID  . enoxaparin (LOVENOX) injection  40 mg Subcutaneous Q24H  . hydrALAZINE  25 mg Oral Q8H  . levothyroxine  50 mcg Oral Q0600  . Urea  30 g Oral BID

## 2020-03-09 NOTE — Progress Notes (Signed)
PROGRESS NOTE  Christy Wade TKW:[id number] DOB: July 09, 1936 DOA: 03/05/2020 PCP: Ronnald Nian, DO  Brief History   The patient is a 84 yr old woman who presented to [facility] ED on 03/05/2020 after a fall. The patient began having nausea/vomiting/diarrhea on 03/01/2020. Her diarrhea subsided on 03/03/2020, but she has continued to have nausea and vomiting which has been bilious and non-bloody. She thought that it may have been caused by a taco from a local restaurant on 02/29/2020. She was seen at an urgent care and was given zofran and sent home. On 03/05/2020, the patient got out of bed, and fell. She was unable to get up on her own due to weakness. Of note, HCTZ was added to this patient's antihypertensive regimen on 02/29/2020. She did not hit her head.  In the ED the patient was found to be significantly hyponatremic at 111, acidotic with bicarb of 18 and anion gap of 18. She was also hypochloremic at 18, and her creatinine was elevated at 1.26. CXR demonstrated fracture of the 7th through ninth rib with prominent displacement. The patient has maintained oxygen saturations in the high 90's on room air.  [facility] were consulted to admit the patient for further evaluation and care. She has been admitted to a telemetry bed. Initially her hyponatremia was treated with NS. However, her sodium has dropped to 109. I have transferred the patient to the ICU, placed her on 3% NS at 30 cc/hr. TSH, FT4, Urine osmolality and sodium, and serum urea have been ordered. I appreciate nephrology assistance. On 03/09/2020 NS 3% was stopped and the patient was transferred out of the ICU. She is receiving urea. Will continue to monitor sodium carefully. Urine cultures have grown out Klebsiella pneumoniae. It is sensitive to ceftriaxone.   Consultants  . Nephrology  Procedures  . None  Antibiotics   Anti-infectives (From admission, onward)   Start     Dose/Rate Route Frequency Ordered Stop   03/06/20 1700   cefTRIAXone (ROCEPHIN) 1 g in sodium chloride 0.9 % 100 mL IVPB        1 g 200 mL/hr over 30 Minutes Intravenous Every 24 hours 03/06/20 1643       Subjective  The patient is awake, alert, and oriented x 3. No new complaints.  Objective   Vitals:  Vitals:   03/09/20 1000 03/09/20 1200  BP: (!) 136/53 135/64  Pulse: 76 83  Resp: (!) 21 (!) 22  Temp:    SpO2: 96% 98%   Exam:  Constitutional:  . The patient is awake, alert, and oriented x 3. She appears elderly, cachectic, and frail. No acute distress. Respiratory:  . No increased work of breathing. . No wheezes, rales, or rhonchi . No tactile fremitus Cardiovascular:  . Regular rate and rhythm . No murmurs, ectopy, or gallups. . No lateral PMI. No thrills. Abdomen:  . Abdomen is soft, non-tender, non-distended . No hernias, masses, or organomegaly . Normoactive bowel sounds.  Musculoskeletal:  . No cyanosis, clubbing, or edema Skin:  . No rashes, lesions, ulcers . palpation of skin: no induration or nodules Neurologic:  . CN 2-12 intact . Sensation all 4 extremities intact Psychiatric:  . Mental status o Mood, affect appropriate o Orientation to person, place, time  . judgment and insight appear intact  I have personally reviewed the following:   Today's Data  . Vitals, BMP, CBC, Na.  Imaging  . CXR  Scheduled Meds: . acetaminophen  1,000 mg Oral Q8H  .  amLODipine  10 mg Oral Daily  . Chlorhexidine Gluconate Cloth  6 each Topical Daily  . cycloSPORINE  1 drop Both Eyes BID  . enoxaparin (LOVENOX) injection  40 mg Subcutaneous Q24H  . hydrALAZINE  25 mg Oral Q8H  . levothyroxine  50 mcg Oral Q0600  . Urea  30 g Oral BID   Continuous Infusions: . cefTRIAXone (ROCEPHIN)  IV Stopped (03/08/20 1744)    No problems updated.   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 Sodium 114 on  admission. Declined to 109 by the next morning. Patient transferred to ICU and nephrology consulted.  122 this morning. 3% Na has been stopped. She is receiving urea.  Monitor closely.  N/V/D: Diarrhea has subsided, as has nausea and vomiting. The patient reports that she had a normal BM this morning. Stool studies are pending, but patient has not produced a stool. She is receiving a regular diet, but has poor intake.  Displaced fractures of right ribs 7-9 are seen on portable chest. Patient is showing no signs of respiratory distress currently, and oxygenation is not compromised. Pain will be addressed and the patient will be observed for signs of altered respiratory mechanics affecting her respiratory status. Otherwise plan to manage these fractures conservatively.  Dysuria: Urinalysis was positive for UTI. Urine culture has grown out Klebsiella pneumoniae. It is susceptible to Rocephin.   Separated right shoulder: Discussed with Dr. Richardson Dopp of ortho. I have ordered a sling and the patient may have weight bearing as tolerated on that shoulder.  AKI: Creatinine elevated to 1.25 on admission. It appears that her baseline creatinine is 1.1 consistent with CKD II. Continue IV fluids. Creatinine now is 0.90. Avoid nephrotoxic medications and hypotension. Monitor creatinine, electrolytes, and volume status.  Chronic chest wall pain: Noted. Complicates monitoring of acute pain from rib fractures.   Essential Hypertension: Blood pressures are a little high. Lisinopril held due to elevated creatinine. Will add Norvasc.  I have seen and examined this patient myself. I have spent 30 minutes in her evaluation and care.  DVT Prophylaxis: Lovenox CODE STATUS: Full Code Family Communication: none available Disposition:  Status is: Inpatient  Remains inpatient appropriate because:IV treatments appropriate due to intensity of illness or inability to take PO  Dispo: The patient is from: Home               Anticipated d/c is to: Home with Home health vs SNF              Anticipated d/c date is: 3 days              Patient currently is not medically stable to d/c.  Jeffey Janssen, DO [facility] Direct contact: see [email or web address]  7PM-7AM contact night coverage as above 03/09/2020, 12:42 AM  LOS: 1 day         .

## 2020-03-09 NOTE — Progress Notes (Signed)
Physical Therapy Treatment Patient Details Name: Christy Wade MRN: [id number] DOB: [birth year] Today's Date: 03/09/2020    History of Present Illness The patient is a 84 yr old woman who presented to [facility] ED on 03/05/2020 after a fall at home. The patient began having n/v/d on 03/01/2020. She was seen at an urgent care and was given zofran and sent home. On 03/05/2020, the patient got out of bed, and fell, denies hitting her head. She was unable to get up on her own due to weakness. Of note, HCTZ was added to this patient's antihypertensive regimen on 02/29/2020. Patient admitted for hypotnatremia and found toh ave Rt 7-9 rib fractures and Rt shoulder AC joint offset with seperated shoudler. PMH significant for HTN, hypothyroidism, osteoporosis, anemia.     PT Comments     Pt in fair spirits and up to ambulate in halls with assist - pt with L hand on RW for stability and with PT assist to manage RW.  Pt continues unsteady with gait and at high risk for falls.  Follow Up Recommendations  Supervision/Assistance - 24 hour;SNF;Home health PT     Equipment Recommendations  None recommended by PT    Recommendations for Other Services       Precautions / Restrictions Precautions Precautions: Fall Precaution Comments: Rt shoulder sling Required Braces or Orthoses: Sling Restrictions RUE Weight Bearing: Weight bearing as tolerated Other Position/Activity Restrictions: confirmed WBAT R UE    Mobility  Bed Mobility Overal bed mobility: Needs Assistance Bed Mobility: Supine to Sit     Supine to sit: HOB elevated;Min assist     General bed mobility comments: HOB elevated; cues for sequence and use of LEs to self assist  Transfers Overall transfer level: Needs assistance Equipment used: Rolling walker (2 wheeled) Transfers: Sit to/from Stand Sit to Stand: Min assist;From elevated surface         General transfer comment: cues for transition position and use of L UE to self assist.   Min assist to bring wt up and fwd from low recliner and to balance in initial standing  Ambulation/Gait Ambulation/Gait assistance: Min assist;Mod assist Gait Distance (Feet): 200 Feet Assistive device: Rolling walker (2 wheeled) Gait Pattern/deviations: Step-through pattern;Decreased stride length;Decreased step length - left;Decreased step length - right;Trunk flexed Gait velocity: decr   General Gait Details: cues for posture and position from RW;  Assist for stability and to manage RW;  Pt with L hand only on RW (R UE slinged) but with marked improvement in stability vs cane.   Stairs             Wheelchair Mobility    Modified Rankin (Stroke Patients Only)       Balance Overall balance assessment: Needs assistance Sitting-balance support: Feet supported Sitting balance-Leahy Scale: Good     Standing balance support: Single extremity supported Standing balance-Leahy Scale: Poor Standing balance comment: modA                            Cognition Arousal/Alertness: Awake/alert Behavior During Therapy: WFL for tasks assessed/performed Overall Cognitive Status: No family/caregiver present to determine baseline cognitive functioning                                 General Comments: Pt very agreeable and eager to mobilize       Exercises      General Comments  Pertinent Vitals/Pain Pain Assessment: Faces Faces Pain Scale: Hurts a little bit Pain Location: R shoulder Pain Descriptors / Indicators: Sore;Discomfort Pain Intervention(s): Limited activity within patient's tolerance;Monitored during session;Premedicated before session    Home Living                      Prior Function            PT Goals (current goals can now be found in the care plan section) Acute Rehab PT Goals Patient Stated Goal: get back home PT Goal Formulation: With patient Time For Goal Achievement: 03/21/20 Potential to Achieve Goals:  Good Progress towards PT goals: Progressing toward goals    Frequency    Min 3X/week      PT Plan Current plan remains appropriate    Co-evaluation              AM-PAC PT "6 Clicks" Mobility   Outcome Measure  Help needed turning from your back to your side while in a flat bed without using bedrails?: A Lot Help needed moving from lying on your back to sitting on the side of a flat bed without using bedrails?: A Little Help needed moving to and from a bed to a chair (including a wheelchair)?: A Little Help needed standing up from a chair using your arms (e.g., wheelchair or bedside chair)?: A Little Help needed to walk in hospital room?: A Little Help needed climbing 3-5 steps with a railing? : A Lot 6 Click Score: 16    End of Session Equipment Utilized During Treatment: Gait belt Activity Tolerance: Patient tolerated treatment well;Patient limited by fatigue Patient left: in chair;with call bell/phone within reach;with chair alarm set Nurse Communication: Mobility status PT Visit Diagnosis: Muscle weakness (generalized) (M62.81);Other abnormalities of gait and mobility (R26.89);Unsteadiness on feet (R26.81);Difficulty in walking, not elsewhere classified (R26.2)     Time: 8502-7741 PT Time Calculation (min) (ACUTE ONLY): 31 min  Charges:  $Gait Training: 23-37 mins                     [place] Pager [phone] Office [phone]    [place] 03/09/2020, 4:45 PM

## 2020-03-10 LAB — BASIC METABOLIC PANEL
Anion gap: 8 (ref 5–15)
BUN: 36 mg/dL — ABNORMAL HIGH (ref 8–23)
CO2: 19 mmol/L — ABNORMAL LOW (ref 22–32)
Calcium: 8 mg/dL — ABNORMAL LOW (ref 8.9–10.3)
Chloride: 101 mmol/L (ref 98–111)
Creatinine, Ser: 0.73 mg/dL (ref 0.44–1.00)
GFR calc Af Amer: >60 mL/min (ref >60)
GFR calc non Af Amer: >60 mL/min (ref >60)
Glucose, Bld: 96 mg/dL (ref 70–99)
Potassium: 3.8 mmol/L (ref 3.5–5.1)
Sodium: 128 mmol/L — ABNORMAL LOW (ref 135–145)

## 2020-03-10 LAB — CBC WITH DIFFERENTIAL/PLATELET
Abs Immature Granulocytes: 0.08 10*3/uL — ABNORMAL HIGH (ref 0.00–0.07)
Basophils Absolute: 0 10*3/uL (ref 0.0–0.1)
Basophils Relative: 0 %
Eosinophils Absolute: 0.2 10*3/uL (ref 0.0–0.5)
Eosinophils Relative: 2 %
HCT: 29.3 % — ABNORMAL LOW (ref 36.0–46.0)
Hemoglobin: 10.4 g/dL — ABNORMAL LOW (ref 12.0–15.0)
Immature Granulocytes: 1 %
Lymphocytes Relative: 24 %
Lymphs Abs: 1.8 10*3/uL (ref 0.7–4.0)
MCH: 31 pg (ref 26.0–34.0)
MCHC: 35.5 g/dL (ref 30.0–36.0)
MCV: 87.5 fL (ref 80.0–100.0)
Monocytes Absolute: 0.8 10*3/uL (ref 0.1–1.0)
Monocytes Relative: 11 %
Neutro Abs: 4.7 10*3/uL (ref 1.7–7.7)
Neutrophils Relative %: 62 %
Platelets: 214 10*3/uL (ref 150–400)
RBC: 3.35 MIL/uL — ABNORMAL LOW (ref 3.87–5.11)
RDW: 14.4 % (ref 11.5–15.5)
WBC: 7.7 10*3/uL (ref 4.0–10.5)
nRBC: 0 % (ref 0.0–0.2)

## 2020-03-10 LAB — SODIUM
Sodium: 128 mmol/L — ABNORMAL LOW (ref 135–145)
Sodium: 127 mmol/L — ABNORMAL LOW (ref 135–145)
Sodium: 127 mmol/L — ABNORMAL LOW (ref 135–145)
Sodium: 126 mmol/L — ABNORMAL LOW (ref 135–145)
Sodium: 129 mmol/L — ABNORMAL LOW (ref 135–145)

## 2020-03-10 LAB — MAGNESIUM: Magnesium: 1.8 mg/dL (ref 1.7–2.4)

## 2020-03-10 MED ORDER — SODIUM CHLORIDE 1 G PO TABS
2.0000 g | ORAL_TABLET | Freq: Two times a day (BID) | ORAL | Status: DC
  Administered 2020-03-10 – 2020-03-14 (×7): 2 g via ORAL
  Filled 2020-03-10 (×9): qty 2

## 2020-03-10 MED ORDER — LABETALOL HCL 100 MG PO TABS
100.0000 mg | ORAL_TABLET | Freq: Once | ORAL | Status: AC
  Administered 2020-03-10: 100 mg via ORAL
  Filled 2020-03-10: qty 1

## 2020-03-10 MED ORDER — CEPHALEXIN 500 MG PO CAPS
500.0000 mg | ORAL_CAPSULE | Freq: Two times a day (BID) | ORAL | Status: DC
  Administered 2020-03-10 – 2020-03-11 (×4): 500 mg via ORAL
  Filled 2020-03-10 (×5): qty 1

## 2020-03-10 MED ORDER — SALINE SPRAY 0.65 % NA SOLN
1.0000 | NASAL | Status: DC | PRN
  Administered 2020-03-10: 1 via NASAL
  Filled 2020-03-10: qty 44

## 2020-03-10 NOTE — TOC Progression Note (Signed)
Transition of Care [facility]) - Progression Note    Patient Details  Name: Christy Wade MRN: [id number] Date of Birth: [birth year]  Transition of [facility]) CM/SW Contact  Leeroy Cha, RN Phone Number: 03/10/2020, 3:46 PM  Clinical Narrative:    TCT-daughter Christy Wade/ gave ;list pf [preferred bed for snf..  [place] -preferred [place] -Preferred. [facility]. Will talk with patient and family and call me back.   Expected Discharge Plan: [place] Barriers to Discharge: Continued Medical Work up  Expected Discharge Plan and Services Expected Discharge Plan: [place]   Discharge Planning Services: CM Consult   Living arrangements for the past 2 months: Single Family Home                                       Social Determinants of Health (SDOH) Interventions    Readmission Risk Interventions No flowsheet data found.

## 2020-03-10 NOTE — Progress Notes (Signed)
[facility] Progress Note    Assessment/ Plan:   Severe Hyponatremia: - secondary to new HCTZ and also probably diarrhea/ n/v. Admit Na+ was 111, had possible seizure at home. Had mix of med-related (new HCTZ) and hypovolemia most likely, possible component of SIADH -Na improved to 127 after treatment first w/ 3% saline > then fluid restrict/ po urea - now is not tolerating urea, causing nausea/ vomiting > will dc - encouraged solute intake, would not restrict Na in diet for now - acei and hctz were dc'd > replaced by norvasc/ hydralazine - cont fluid restrict 800- 1000cc /d until f/u w/ PCP - will order salt tabs 2gm bid for 1 week only - we will see in office in 3-4 wks (Dr Royce Macadamia, Vianne Bulls) but she should be seen by PCP within a week of discharge to f/u Na+ levels - will sign off, call as needed  Hypokalemia -Ordered potassium chloride 60 mEq this a.m. -Would aim for potassium greater than 4 as this will only help her hyponatremia  AKI, improved/resovled -creat down from 1.4 > 0.73, resolved -hold RAAS inhibition for now  -Agree with holding ACE-I, avoid further nephrotoxins including NSAIDS, Morphine.  Unless absolutely necessary, avoid CT with contrast and/or MRI with gadolinium.     Klebsiella pneumoniae UTI -started on rocephin  Leukocytosis -+urine cx as above, on abx  Hypertension, improved: -norvasc to 10mg  daily and hydralazine now (acei/ hctz dc'd)  Non anion gap metabolic acidosis, stable -likely secondary to diarrhea, expect this to improve provided her diarrhea improves  Diabetes Mellitus Type 2: mgmt per primary service  Kelly Splinter, MD 03/10/2020, 1:30 PM        Subjective:   Na 127, urea causing N/V, otherwise no complaint.  pmd state she is going to SNF now.     Objective:   BP (!) 146/67   Pulse (!) 104   Temp 97.7 F (36.5 C) (Oral)   Resp (!) 28   Ht 5\' 7"  (1.702 m)   Wt 71.7 kg   SpO2 94%   BMI 24.76 kg/m    Intake/Output Summary (Last 24 hours) at 03/10/2020 1323 Last data filed at 03/10/2020 1200 Gross per 24 hour  Intake 1093.93 ml  Output 2100 ml  Net -1006.07 ml   Weight change:   Physical Exam: Gen:nad, comfortable, sitting up in chair CVS:s1s2, rrr Resp:cta bl, no w/r/r/c, unlabored, bl chest expansion BHA:LPFX, nt/nd Ext:no edema Neuro: speech clear and coherent, moves all ext spontaneously  Imaging: No results found.  Labs: BMET Recent Labs  Lab 03/06/20 2036 03/06/20 2036 03/07/20 0223 03/07/20 0223 03/07/20 0719 03/07/20 1407 03/08/20 0520 03/08/20 0808 03/09/20 0628 03/09/20 0726 03/09/20 1002 03/09/20 1203 03/09/20 1531 03/09/20 2104 03/10/20 0216 03/10/20 0738 03/10/20 1143  NA 114*   < > 121*   < > 121*  120*   < > 118*   < > 123*   < > 124* 127* 127* 126* 128* 127*  128* 127*  K 3.9  --  3.9  --  3.8  --  3.5  --  2.8*  --   --   --  3.5  --   --  3.8  --   CL 85*  --  88*  --  92*  --  89*  --  96*  --   --   --  99  --   --  101  --   CO2 18*  --  19*  --  18*  --  19*  --  19*  --   --   --  20*  --   --  19*  --   GLUCOSE 112*  --  94  --  107*  --  117*  --  102*  --   --   --  142*  --   --  96  --   BUN 16  --  14  --  11  --  21  --  17  --   --   --  21  --   --  36*  --   CREATININE 1.13*  --  1.05*  --  0.90  --  0.86  --  0.74  --   --   --  0.91  --   --  0.73  --   CALCIUM 8.1*  --  8.6*  --  7.9*  --  7.9*  --  7.7*  --   --   --  7.7*  --   --  8.0*  --   PHOS  --   --   --   --  2.2*  --   --   --   --   --   --   --   --   --   --   --   --    < > = values in this interval not displayed.   CBC Recent Labs  Lab 03/07/20 0223 03/08/20 0520 03/09/20 0628 03/10/20 0738  WBC 12.0* 13.0* 10.5 7.7  NEUTROABS 9.4* 10.8* 7.5 4.7  HGB 12.3 11.5* 10.5* 10.4*  HCT 33.8* 33.2* 29.8* 29.3*  MCV 83.7 87.6 87.1 87.5  PLT 171 146* 188 214    Medications:    . acetaminophen  1,000 mg Oral Q8H  . amLODipine  10 mg Oral Daily  .  cephALEXin  500 mg Oral Q12H  . Chlorhexidine Gluconate Cloth  6 each Topical Daily  . cycloSPORINE  1 drop Both Eyes BID  . enoxaparin (LOVENOX) injection  40 mg Subcutaneous Q24H  . hydrALAZINE  25 mg Oral Q8H  . levothyroxine  50 mcg Oral Q0600  . Urea  30 g Oral BID

## 2020-03-10 NOTE — NC FL2 (Signed)
[place] LEVEL OF CARE SCREENING TOOL     IDENTIFICATION  Patient Name: Christy Wade Birthdate: [birth year] Sex: female Admission Date (Current Location): 03/05/2020  [facility] and [place] Number:  Herbalist and Address:  [facility],  [address], [place]      Provider Number: [id number]  Attending Physician Name and Address:  Karie Kirks, DO  Relative Name and Phone Number:       Current Level of Care: Hospital Recommended Level of Care: [place] Prior Approval Number:    Date Approved/Denied:   PASRR Number: [id number]  Discharge Plan: SNF    Current Diagnoses: Patient Active Problem List   Diagnosis Date Noted   Multiple fractures of ribs, right side, subsequent encounter for fracture with routine healing 03/06/2020   Hyponatremia 03/05/2020   Diarrhea 07/24/2018   Vaginitis and vulvovaginitis 05/22/2016   Well adult exam 10/01/2015   Chronic chest wall pain 09/09/2014   Mitral valve regurgitation 06/21/2012   Essential hypertension 01/03/2008   Osteopenia 03/28/2007   Hypothyroidism 02/16/2007    Orientation RESPIRATION BLADDER Height & Weight     Self, Time, Situation, Place  Normal Continent Weight: 71.7 kg Height:  5\' 7"  (170.2 cm)  BEHAVIORAL SYMPTOMS/MOOD NEUROLOGICAL BOWEL NUTRITION STATUS      Continent Diet (regular)  AMBULATORY STATUS COMMUNICATION OF NEEDS Skin   Extensive Assist Verbally Normal                       Personal Care Assistance Level of Assistance  Bathing, Feeding, Dressing Bathing Assistance: Limited assistance Feeding assistance: Limited assistance Dressing Assistance: Limited assistance     Functional Limitations Info  Sight, Hearing, Speech Sight Info: Adequate Hearing Info: Adequate Speech Info: Adequate    SPECIAL CARE FACTORS FREQUENCY  PT (By licensed PT), OT (By licensed OT)     PT Frequency: 5x weekly OT Frequency: 5x  weekly            Contractures Contractures Info: Not present    Additional Factors Info  Code Status, Allergies Code Status Info: full Allergies Info: sulkfa and codeine           Current Medications (03/10/2020):  This is the current hospital active medication list Current Facility-Administered Medications  Medication Dose Route Frequency Provider Last Rate Last Admin   acetaminophen (TYLENOL) tablet 1,000 mg  1,000 mg Oral Q8H Swayze, Ava, DO   1,000 mg at 03/10/20 0617   amLODipine (NORVASC) tablet 10 mg  10 mg Oral Daily Gean Quint, MD   10 mg at 03/10/20 7425   cefTRIAXone (ROCEPHIN) 1 g in sodium chloride 0.9 % 100 mL IVPB  1 g Intravenous Q24H Madelon Lips, MD 200 mL/hr at 03/09/20 1753 1 g at 03/09/20 1753   Chlorhexidine Gluconate Cloth 2 % PADS 6 each  6 each Topical Daily Swayze, Ava, DO   6 each at 03/10/20 0618   cycloSPORINE (RESTASIS) 0.05 % ophthalmic emulsion 1 drop  1 drop Both Eyes BID Swayze, Ava, DO   1 drop at 03/10/20 0930   enoxaparin (LOVENOX) injection 40 mg  40 mg Subcutaneous Q24H Swayze, Ava, DO   40 mg at 03/09/20 2105   hydrALAZINE (APRESOLINE) tablet 25 mg  25 mg Oral Q8H Swayze, Ava, DO   25 mg at 03/10/20 0618   HYDROmorphone (DILAUDID) injection 0.5 mg  0.5 mg Intravenous Q3H PRN Swayze, Ava, DO   0.5 mg at 03/09/20  0529   labetalol (NORMODYNE) injection 10 mg  10 mg Intravenous Q2H PRN Swayze, Ava, DO   10 mg at 03/07/20 1715   levothyroxine (SYNTHROID) tablet 50 mcg  50 mcg Oral Q0600 Swayze, Ava, DO   50 mcg at 03/10/20 0617   ondansetron (ZOFRAN) tablet 4 mg  4 mg Oral Q6H PRN Swayze, Ava, DO   4 mg at 03/06/20 2044   Or   ondansetron (ZOFRAN) injection 4 mg  4 mg Intravenous Q6H PRN Swayze, Ava, DO   4 mg at 03/09/20 2054   oxyCODONE (Oxy IR/ROXICODONE) immediate release tablet 5 mg  5 mg Oral Q4H PRN Swayze, Ava, DO   5 mg at 03/09/20 2105   senna-docusate (Senokot-S) tablet 1 tablet  1 tablet Oral QHS PRN Swayze, Ava, DO        traZODone (DESYREL) tablet 25 mg  25 mg Oral QHS PRN Swayze, Ava, DO   25 mg at 03/07/20 2054   Urea PACK 30 g  30 g Oral BID Gean Quint, MD   30 g at 03/10/20 0930     Discharge Medications: Please see discharge summary for a list of discharge medications.  Relevant Imaging Results:  Relevant Lab Results:   Additional Information MLY:[id number]  Leeroy Cha, RN

## 2020-03-10 NOTE — Progress Notes (Signed)
Notified MD in regards to heart ectopy and HR burst in the 170s. See new orders

## 2020-03-10 NOTE — Progress Notes (Signed)
PROGRESS NOTE  Christy Wade HYQ:[id number] DOB: December 29, 1935 DOA: 03/05/2020 PCP: Ronnald Nian, DO  Brief History   The patient is a 84 yr old woman who presented to [facility] ED on 03/05/2020 after a fall. The patient began having nausea/vomiting/diarrhea on 03/01/2020. Her diarrhea subsided on 03/03/2020, but she has continued to have nausea and vomiting which has been bilious and non-bloody. She thought that it may have been caused by a taco from a local restaurant on 02/29/2020. She was seen at an urgent care and was given zofran and sent home. On 03/05/2020, the patient got out of bed, and fell. She was unable to get up on her own due to weakness. Of note, HCTZ was added to this patient's antihypertensive regimen on 02/29/2020. She did not hit her head.  In the ED the patient was found to be significantly hyponatremic at 111, acidotic with bicarb of 18 and anion gap of 18. She was also hypochloremic at 18, and her creatinine was elevated at 1.26. CXR demonstrated fracture of the 7th through ninth rib with prominent displacement. The patient has maintained oxygen saturations in the high 90's on room air.  [facility] were consulted to admit the patient for further evaluation and care. She has been admitted to a telemetry bed. Initially her hyponatremia was treated with NS. However, her sodium has dropped to 109. I have transferred the patient to the ICU, placed her on 3% NS at 30 cc/hr. TSH, FT4, Urine osmolality and sodium, and serum urea have been ordered. I appreciate nephrology assistance. On 03/09/2020 NS 3% was stopped and the patient was transferred out of the ICU. She is receiving urea. Will continue to monitor sodium carefully. Urine cultures have grown out Klebsiella pneumoniae. It is sensitive to ceftriaxone. The patient will be transitioned to oral antibiotics today.   Consultants   Nephrology  Procedures   None  Antibiotics   Anti-infectives (From admission, onward)   Start      Dose/Rate Route Frequency Ordered Stop   03/10/20 1200  cephALEXin (KEFLEX) capsule 500 mg        500 mg Oral Every 12 hours 03/10/20 1127     03/06/20 1700  cefTRIAXone (ROCEPHIN) 1 g in sodium chloride 0.9 % 100 mL IVPB  Status:  Discontinued        1 g 200 mL/hr over 30 Minutes Intravenous Every 24 hours 03/06/20 1643 03/10/20 1126     Subjective  The patient is awake, alert, and oriented x 3. No new complaints.  Objective   Vitals:  Vitals:   03/10/20 1100 03/10/20 1200  BP: (!) 148/66 (!) 146/67  Pulse: (!) 109 (!) 104  Resp: 17 (!) 28  Temp:  97.7 F (36.5 C)  SpO2: 95% 94%   Exam:  Constitutional:   The patient is awake, alert, and oriented x 3. She appears elderly, cachectic, and frail. No acute distress. Respiratory:   No increased work of breathing.  No wheezes, rales, or rhonchi  No tactile fremitus Cardiovascular:   Regular rate and rhythm  No murmurs, ectopy, or gallups.  No lateral PMI. No thrills. Abdomen:   Abdomen is soft, non-tender, non-distended  No hernias, masses, or organomegaly  Normoactive bowel sounds.  Musculoskeletal:   No cyanosis, clubbing, or edema Skin:   No rashes, lesions, ulcers  palpation of skin: no induration or nodules Neurologic:   CN 2-12 intact  Sensation all 4 extremities intact Psychiatric:   Mental status o Mood, affect appropriate o  Orientation to person, place, time   judgment and insight appear intact  I have personally reviewed the following:   Today's Data   Vitals, BMP, CBC, Na.  Imaging   CXR  Scheduled Meds:  acetaminophen  1,000 mg Oral Q8H   amLODipine  10 mg Oral Daily   cephALEXin  500 mg Oral Q12H   Chlorhexidine Gluconate Cloth  6 each Topical Daily   cycloSPORINE  1 drop Both Eyes BID   enoxaparin (LOVENOX) injection  40 mg Subcutaneous Q24H   hydrALAZINE  25 mg Oral Q8H   levothyroxine  50 mcg Oral Q0600   sodium chloride  2 g Oral BID WC   Continuous  Infusions:   No problems updated.   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 Sodium 114 on admission. Declined to 109 by the next morning. Patient transferred to ICU and nephrology consulted.  127 this morning. 3% Na has been stopped. She was receiving urea-Na, but was unable to tolerate it. She is now on a fluid restriction and receiving salt tablets twice a day. Nephrology has cleared the patient for discharge with close monitoring of the sodium as outpatient.   N/V/D: Diarrhea has subsided, as has nausea and vomiting. The patient reports that she had a normal BM this morning. Stool studies are pending, but patient has not produced a stool. She is receiving a regular diet, but has poor intake.  Displaced fractures of right ribs 7-9 are seen on portable chest. Patient is showing no signs of respiratory distress currently, and oxygenation is not compromised. Pain will be addressed and the patient will be observed for signs of altered respiratory mechanics affecting her respiratory status. Otherwise plan to manage these fractures conservatively.  Dysuria: Urinalysis was positive for UTI. Urine culture has grown out Klebsiella pneumoniae. It is susceptible to Rocephin.   Separated right shoulder: Discussed with Dr. Richardson Dopp of ortho. I have ordered a sling and the patient may have weight bearing as tolerated on that shoulder. She will requiring SNF for rehab prior to discharge to home.  AKI: Creatinine elevated to 1.25 on admission. It appears that her baseline creatinine is 1.1 consistent with CKD II. Continue IV fluids. Creatinine now is 0.73. Avoid nephrotoxic medications and hypotension. Monitor creatinine, electrolytes, and volume status.  Chronic chest wall pain: Noted. Complicates monitoring of acute pain from rib fractures.   Essential Hypertension: Blood pressures are a little  high. Lisinopril held due to elevated creatinine. Will add Norvasc.  I have seen and examined this patient myself. I have spent 32 minutes in her evaluation and care. More than 50% of this was spent in coordination of care with nephrology.  DVT Prophylaxis: Lovenox CODE STATUS: Full Code Family Communication: none available Disposition:  Status is: Inpatient  Remains inpatient appropriate because:IV treatments appropriate due to intensity of illness or inability to take PO  Dispo: The patient is from: Home              Anticipated d/c is to: Home with Home health vs SNF              Anticipated d/c date is: 3 days              Patient currently is not medically stable to d/c.  Christy Paz, DO [facility] Direct contact: see [email or web address]  7PM-7AM contact night coverage as above  03/10/2020, 3:16 AM  LOS: 1 day         .

## 2020-03-10 NOTE — TOC Progression Note (Signed)
Transition of Care [facility]) - Progression Note    Patient Details  Name: Christy Wade MRN: [id number] Date of Birth: 25-Jan-1936  Transition of Care [facility]) CM/SW Contact  Leeroy Cha, RN Phone Number: 03/10/2020, 9:55 AM  Clinical Narrative:    Plan ios now to go to snf Passar [phone] A [id number] Will send out toi area snf's via the hub/ pt is from [place].   Expected Discharge Plan: [place] Barriers to Discharge: Continued Medical Work up  Expected Discharge Plan and Services Expected Discharge Plan: [place]   Discharge Planning Services: CM Consult   Living arrangements for the past 2 months: Single Family Home                                       Social Determinants of Health (SDOH) Interventions    Readmission Risk Interventions No flowsheet data found.

## 2020-03-11 LAB — BASIC METABOLIC PANEL WITH GFR
Anion gap: 10 (ref 5–15)
BUN: 21 mg/dL (ref 8–23)
CO2: 20 mmol/L — ABNORMAL LOW (ref 22–32)
Calcium: 8.4 mg/dL — ABNORMAL LOW (ref 8.9–10.3)
Chloride: 100 mmol/L (ref 98–111)
Creatinine, Ser: 0.85 mg/dL (ref 0.44–1.00)
GFR calc Af Amer: >60 mL/min
GFR calc non Af Amer: >60 mL/min
Glucose, Bld: 138 mg/dL — ABNORMAL HIGH (ref 70–99)
Potassium: 3.6 mmol/L (ref 3.5–5.1)
Sodium: 130 mmol/L — ABNORMAL LOW (ref 135–145)

## 2020-03-11 LAB — SARS CORONAVIRUS 2 BY RT PCR (HOSPITAL ORDER, PERFORMED IN ~~LOC~~ HOSPITAL LAB): SARS Coronavirus 2: NEGATIVE

## 2020-03-11 LAB — SODIUM
Sodium: 127 mmol/L — ABNORMAL LOW (ref 135–145)
Sodium: 126 mmol/L — ABNORMAL LOW (ref 135–145)
Sodium: 128 mmol/L — ABNORMAL LOW (ref 135–145)

## 2020-03-11 NOTE — TOC Progression Note (Signed)
Transition of Care [facility]) - Progression Note    Patient Details  Name: Christy Wade MRN: [id number] Date of Birth: January 30, 1936  Transition of Care [facility]) CM/SW Contact  Joaquin Courts, RN Phone Number: 03/11/2020, 3:38 PM  Clinical Narrative:  Christy Wade  Notified of planned discharge to SNF under current waiver, reference id [id number].       Expected Discharge Plan: [place] Barriers to Discharge: Continued Medical Work up  Expected Discharge Plan and Services Expected Discharge Plan: [place]   Discharge Planning Services: CM Consult   Living arrangements for the past 2 months: Single Family Home                                       Social Determinants of Health (SDOH) Interventions    Readmission Risk Interventions No flowsheet data found.

## 2020-03-11 NOTE — Progress Notes (Signed)
PROGRESS NOTE  Christy Wade JGG:[id number] DOB: 03-26-1936 DOA: 03/05/2020 PCP: Ronnald Nian, DO  Brief History   The patient is a 84 yr old woman who presented to [facility] ED on 03/05/2020 after a fall. The patient began having nausea/vomiting/diarrhea on 03/01/2020. Her diarrhea subsided on 03/03/2020, but she has continued to have nausea and vomiting which has been bilious and non-bloody. She thought that it may have been caused by a taco from a local restaurant on 02/29/2020. She was seen at an urgent care and was given zofran and sent home. On 03/05/2020, the patient got out of bed, and fell. She was unable to get up on her own due to weakness. Of note, HCTZ was added to this patient's antihypertensive regimen on 02/29/2020. She did not hit her head.  In the ED the patient was found to be significantly hyponatremic at 111, acidotic with bicarb of 18 and anion gap of 18. She was also hypochloremic at 18, and her creatinine was elevated at 1.26. CXR demonstrated fracture of the 7th through ninth rib with prominent displacement. The patient has maintained oxygen saturations in the high 90's on room air.  [facility] were consulted to admit the patient for further evaluation and care. She has been admitted to a telemetry bed. Initially her hyponatremia was treated with NS. However, her sodium has dropped to 109. I have transferred the patient to the ICU, placed her on 3% NS at 30 cc/hr. TSH, FT4, Urine osmolality and sodium, and serum urea have been ordered. I appreciate nephrology assistance. On 03/09/2020 NS 3% was stopped and the patient was transferred out of the ICU. She is receiving urea. Will continue to monitor sodium carefully. Urine cultures have grown out Klebsiella pneumoniae. It is sensitive to ceftriaxone. The patient will be transitioned to oral antibiotics today.   Consultants   Nephrology  Procedures   None  Antibiotics   Anti-infectives (From admission, onward)   Start      Dose/Rate Route Frequency Ordered Stop   03/10/20 1200  cephALEXin (KEFLEX) capsule 500 mg        500 mg Oral Every 12 hours 03/10/20 1127     03/06/20 1700  cefTRIAXone (ROCEPHIN) 1 g in sodium chloride 0.9 % 100 mL IVPB  Status:  Discontinued        1 g 200 mL/hr over 30 Minutes Intravenous Every 24 hours 03/06/20 1643 03/10/20 1126     Subjective  The patient is awake, alert, and oriented x 3. No new complaints.  Objective   Vitals:  Vitals:   03/11/20 1026 03/11/20 1239  BP: 127/72 (!) 143/58  Pulse:  92  Resp: 20 20  Temp:  98 F (36.7 C)  SpO2:  94%   Exam:  Constitutional:   The patient is awake, alert, and oriented x 3. She appears elderly, cachectic, and frail. No acute distress. Respiratory:   No increased work of breathing.  No wheezes, rales, or rhonchi  No tactile fremitus Cardiovascular:   Regular rate and rhythm  No murmurs, ectopy, or gallups.  No lateral PMI. No thrills. Abdomen:   Abdomen is soft, non-tender, non-distended  No hernias, masses, or organomegaly  Normoactive bowel sounds.  Musculoskeletal:   No cyanosis, clubbing, or edema Skin:   No rashes, lesions, ulcers  palpation of skin: no induration or nodules Neurologic:   CN 2-12 intact  Sensation all 4 extremities intact Psychiatric:   Mental status o Mood, affect appropriate o Orientation to person, place,  time   judgment and insight appear intact  I have personally reviewed the following:   Today's Data   Vitals, BMP, CBC, Na.  Imaging   CXR  Scheduled Meds:  acetaminophen  1,000 mg Oral Q8H   amLODipine  10 mg Oral Daily   cephALEXin  500 mg Oral Q12H   Chlorhexidine Gluconate Cloth  6 each Topical Daily   cycloSPORINE  1 drop Both Eyes BID   enoxaparin (LOVENOX) injection  40 mg Subcutaneous Q24H   hydrALAZINE  25 mg Oral Q8H   levothyroxine  50 mcg Oral Q0600   sodium chloride  2 g Oral BID WC   Continuous Infusions:   No problems  updated.   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 Sodium 114 on admission. Declined to 109 by the next morning. Patient transferred to ICU and nephrology consulted.  130 this morning. 3% Na has been stopped. She was receiving urea-Na, but was unable to tolerate it. She is now on a fluid restriction and receiving salt tablets twice a day. Nephrology has cleared the patient for discharge with close monitoring of the sodium as outpatient.   N/V/D: Diarrhea has subsided, as has nausea and vomiting. The patient reports that she had a normal BM this morning. Stool studies are pending, but patient has not produced a stool. She is receiving a regular diet, but has poor intake.  Displaced fractures of right ribs 7-9 are seen on portable chest. Patient is showing no signs of respiratory distress currently, and oxygenation is not compromised. Pain will be addressed and the patient will be observed for signs of altered respiratory mechanics affecting her respiratory status. Otherwise plan to manage these fractures conservatively.  Dysuria: Urinalysis was positive for UTI. Urine culture has grown out Klebsiella pneumoniae. It is susceptible to Rocephin.   Separated right shoulder: Discussed with Dr. Richardson Dopp of ortho. I have ordered a sling and the patient may have weight bearing as tolerated on that shoulder. She will requiring SNF for rehab prior to discharge to home.  AKI: Creatinine elevated to 1.25 on admission. It appears that her baseline creatinine is 1.1 consistent with CKD II. Continue IV fluids. Creatinine now is 0.73. Avoid nephrotoxic medications and hypotension. Monitor creatinine, electrolytes, and volume status.  Chronic chest wall pain: Noted. Complicates monitoring of acute pain from rib fractures.   Essential Hypertension: Blood pressures are a little high. Lisinopril held due  to elevated creatinine. Will add Norvasc.  I have seen and examined this patient myself. I have spent 30 minutes in her evaluation and care. More than 50% of this was spent in coordination of care with nephrology.  DVT Prophylaxis: Lovenox CODE STATUS: Full Code Family Communication: none available Disposition:  Status is: Inpatient  Remains inpatient appropriate because: The patient is currently medically safe for discharge. She is lacking a safe discharge.  Dispo: The patient is from: Home              Anticipated d/c is to: Home with Home health vs SNF              Anticipated d/c date is: 2 days              Patient currently is medically safe for discharge. Lacking safe discharge.  Ashle Stief, DO [facility] Direct contact: see [email or web address]  7PM-7AM contact night coverage as above  03/11/2020, 2:23 AM  LOS: 1 day         .

## 2020-03-11 NOTE — Progress Notes (Signed)
Pt transferred off unit via bed by this nurse. Report given to [facility]. Daughter notified of transfer.

## 2020-03-11 NOTE — TOC Progression Note (Addendum)
Transition of Care [facility]) - Progression Note    Patient Details  Name: Christy Wade MRN: [id number] Date of Birth: February 11, 1936  Transition [facility] Care [facility]) CM/SW Contact  Leeroy Cha, RN Phone Number: 03/11/2020, 3:29 PM  Clinical Narrative:    [facility] th best choice at this point/[facility] tomorrow for bed should be fine. Patient is vaccinated against covid-19.   Expected Discharge Plan: [place] Barriers to Discharge: Continued Medical Work up  Expected Discharge Plan and Services Expected Discharge Plan: [place]   Discharge Planning Services: CM Consult   Living arrangements for the past 2 months: Single Family Home                                       Social Determinants of Health (SDOH) Interventions    Readmission Risk Interventions No flowsheet data found.

## 2020-03-11 NOTE — Progress Notes (Signed)
Physical Therapy Treatment Patient Details Name: Christy Wade MRN: [id number] DOB: 10-Jan-1936 Today's Date: 03/11/2020    History of Present Illness The patient is a 84 yr old woman who presented to [facility] ED on 03/05/2020 after a fall at home. The patient began having n/v/d on 03/01/2020. She was seen at an urgent care and was given zofran and sent home. On 03/05/2020, the patient got out of bed, and fell, denies hitting her head. She was unable to get up on her own due to weakness. Of note, HCTZ was added to this patient's antihypertensive regimen on 02/29/2020. Patient admitted for hypotnatremia and found toh ave Rt 7-9 rib fractures and Rt shoulder AC joint offset with seperated shoudler. PMH significant for HTN, hypothyroidism, osteoporosis, anemia.     PT Comments    Pt participated fairly well on today. She c/o some fatigue and general weakness on today. Remains unsteady and at risk for further falls.  Mild discomfort R UE. Discussed d/c plan-pt is agreeable to ST SNF rehab.    Follow Up Recommendations  SNF     Equipment Recommendations  None recommended by PT    Recommendations for Other Services       Precautions / Restrictions Precautions Precautions: Fall Required Braces or Orthoses: Sling (R UE) Restrictions Weight Bearing Restrictions: No    Mobility  Bed Mobility Overal bed mobility: Needs Assistance       Supine to sit: Mod assist;HOB elevated     General bed mobility comments: Asssit for trunk and to scoot to EOB. Increased time. Cues for safety, technique.  Transfers Overall transfer level: Needs assistance Equipment used: Hemi-walker Transfers: Sit to/from Stand Sit to Stand: Min assist         General transfer comment: Cues for safety, hand placement. Assist to rise, steady, control descent  Ambulation/Gait Ambulation/Gait assistance: Min assist Gait Distance (Feet): 10 Feet Assistive device: Hemi-walker Gait Pattern/deviations: Decreased step  length - right;Decreased step length - left;Decreased stride length;Step-to pattern;Step-through pattern     General Gait Details: Assist to stabilize pt and manage hemiwalker. Cues for safety, technique. Dyspnea 2/4. Pt fatigued easily on today   Stairs             Wheelchair Mobility    Modified Rankin (Stroke Patients Only)       Balance Overall balance assessment: Needs assistance;History of Falls         Standing balance support: Single extremity supported Standing balance-Leahy Scale: Poor                              Cognition Arousal/Alertness: Awake/alert Behavior During Therapy: WFL for tasks assessed/performed Overall Cognitive Status: No family/caregiver present to determine baseline cognitive functioning                                 General Comments: participates well. she goes back and forth regarding understanding of need to wear sling      Exercises      General Comments        Pertinent Vitals/Pain Pain Assessment: Faces Faces Pain Scale: Hurts a little bit Pain Location: R shoulder Pain Descriptors / Indicators: Sore;Discomfort Pain Intervention(s): Limited activity within patient's tolerance;Monitored during session;Repositioned    Home Living                      Prior  Function            PT Goals (current goals can now be found in the care plan section) Progress towards PT goals: Progressing toward goals    Frequency    Min 3X/week      PT Plan Current plan remains appropriate    Co-evaluation              AM-PAC PT "6 Clicks" Mobility   Outcome Measure  Help needed turning from your back to your side while in a flat bed without using bedrails?: A Lot Help needed moving from lying on your back to sitting on the side of a flat bed without using bedrails?: A Lot Help needed moving to and from a bed to a chair (including a wheelchair)?: A Little Help needed standing up from a  chair using your arms (e.g., wheelchair or bedside chair)?: A Little Help needed to walk in hospital room?: A Little Help needed climbing 3-5 steps with a railing? : A Lot 6 Click Score: 15    End of Session Equipment Utilized During Treatment: Gait belt Activity Tolerance: Patient limited by fatigue;Patient tolerated treatment well Patient left: in chair;with call bell/phone within reach;with chair alarm set   PT Visit Diagnosis: Muscle weakness (generalized) (M62.81);Other abnormalities of gait and mobility (R26.89);Unsteadiness on feet (R26.81);Difficulty in walking, not elsewhere classified (R26.2)     Time: 4801-6553 PT Time Calculation (min) (ACUTE ONLY): 38 min  Charges:  $Gait Training: 23-37 mins $Therapeutic Activity: 8-22 mins                        Doreatha Massed, PT Acute Rehabilitation  Office: [phone] Pager: [phone]

## 2020-03-11 NOTE — Care Management Important Message (Signed)
Important Message  Patient Details IM Letter given to the Patient Name: Christy Wade MRN: [id number] Date of Birth: 05-03-1936   Medicare Important Message Given:  Yes     Kerin Salen 03/11/2020, 11:27 AM

## 2020-03-12 LAB — BASIC METABOLIC PANEL
Anion gap: 12 (ref 5–15)
BUN: 17 mg/dL (ref 8–23)
CO2: 18 mmol/L — ABNORMAL LOW (ref 22–32)
Calcium: 8.2 mg/dL — ABNORMAL LOW (ref 8.9–10.3)
Chloride: 97 mmol/L — ABNORMAL LOW (ref 98–111)
Creatinine, Ser: 1.01 mg/dL — ABNORMAL HIGH (ref 0.44–1.00)
GFR calc Af Amer: >60 mL/min (ref >60)
GFR calc non Af Amer: 52 mL/min — ABNORMAL LOW (ref >60)
Glucose, Bld: 92 mg/dL (ref 70–99)
Potassium: 3.5 mmol/L (ref 3.5–5.1)
Sodium: 127 mmol/L — ABNORMAL LOW (ref 135–145)

## 2020-03-12 LAB — SODIUM
Sodium: 126 mmol/L — ABNORMAL LOW (ref 135–145)
Sodium: 125 mmol/L — ABNORMAL LOW (ref 135–145)
Sodium: 129 mmol/L — ABNORMAL LOW (ref 135–145)

## 2020-03-12 MED ORDER — CHLORHEXIDINE GLUCONATE CLOTH 2 % EX PADS
6.0000 | MEDICATED_PAD | Freq: Every day | CUTANEOUS | Status: DC
  Administered 2020-03-12 – 2020-03-14 (×3): 6 via TOPICAL

## 2020-03-12 MED ORDER — HYDROXYZINE HCL 10 MG PO TABS
10.0000 mg | ORAL_TABLET | Freq: Three times a day (TID) | ORAL | Status: DC | PRN
  Administered 2020-03-12 – 2020-03-14 (×3): 10 mg via ORAL
  Filled 2020-03-12 (×4): qty 1

## 2020-03-12 MED ORDER — POTASSIUM CHLORIDE CRYS ER 20 MEQ PO TBCR
40.0000 meq | EXTENDED_RELEASE_TABLET | Freq: Two times a day (BID) | ORAL | Status: DC
  Administered 2020-03-12 – 2020-03-14 (×5): 40 meq via ORAL
  Filled 2020-03-12 (×5): qty 2

## 2020-03-12 NOTE — Progress Notes (Signed)
New orders received and carried out for I & O Cath. Emptied 475 ml. Post void residual 0.0 ml. Pt tolerated well. Denies any pain or discomfort.

## 2020-03-12 NOTE — Progress Notes (Signed)
Bladder scanned patient showed 541ml. MD informed. Ordered foley cath. Inserted Foley cath and draining good. Will continue to monitor.

## 2020-03-12 NOTE — Progress Notes (Signed)
Patient c/o shortness of breath. She repeatedly states "I'm so sick". When I ask her what is wrong all she can c/o is SOB. Her O2 saturations are 93% on room air. Her lung sounds are clear bilaterally. Night shift RN reported similar situations overnight. I asked patient if she felt anxious or if she takes anxiety medicine at home. She states she does not.

## 2020-03-12 NOTE — Progress Notes (Signed)
Post IUC removal pt has not voided for 6 hours. Pt denies pain,discomfort or urge. Pt has been placed on bed pan X2 without voiding. Bladder scan performed 367 ml. Will continue to monitor Q1hr X2 per protocol/algorithm. Night MD notified.

## 2020-03-12 NOTE — Progress Notes (Signed)
PROGRESS NOTE    Christy Wade  GOT:[id number] DOB: Sep 29, 1935 DOA: 03/05/2020 PCP: Ronnald Nian, DO  Brief Narrative:  84 year old white female HTN hypothyroid osteoporosis status post 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 (pathology of mass was ulcer-all nodes negative for cancer) Intermittent bilateral tremor, chronic diarrhea, osteopenia  Worsening nausea vomiting around 8/14 with diarrhea went to urgent care prior to coming to the hospital given Zofran ODT sent home status post fall day of admission 8/18 Recently switched from lisinopril to lisinopril HCTZ by primary care physician  On admission sodium 111 (baseline 133), chloride 78, bicarb 18, anion gap 15 Creatinine up from baseline of 1.1-1.26 WBC 16 CXR 7 through ninth ribs showed fractures  Initially Rx normal saline-however sodium dropped to 109-nephrology consulted-transfer to ICU-placed on 3% by nephrology  Eventually patient was transferred back to the floor-sodium has not resolved completely    Assessment & Plan:   Principal Problem:   Hyponatremia Active Problems:   Hypothyroidism   Essential hypertension   Chronic chest wall pain   Diarrhea   Multiple fractures of ribs, right side, subsequent encounter for fracture with routine healing   1. Severe hyponatremia/hypochloremia on admission a. Required initially ICU level care with following sodiums and nephrology were consulted b. Because of frequency of IV draws I have changed her to every 12 hourly for sodium checks c. Continue salt tablets 2 g twice daily d. We will reinforce fluid restriction and hope that her sodium comes close to 130 which would be ideal for possible discharge in the next several days 2. Fall with rib fractures 7 through 9 a. Patient will need skilled placement once sodium stabilizes b. Pain control with OxyIR every 4 as needed moderate pain with IV Dilaudid if needed c. Covid was -8/24 3. ?  Prior viral  gastroenteritis with metabolic acidosis a. Still probably having some reequilibration b. BUNs/creatinine is down c. Monitor trends over the next several days 4. Klebsiella pneumonia UTI  a. Continue Keflex, discontinue on 8/26 b. Blood cultures were not performed 5. HTN a. Continue Norvasc 10 daily, hydralazine 25 3 times daily b. Lisinopril and lisinopril HCTZ held from admission-see HPI 6. DM TY 2 a. Not on home meds? 7. 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 Griffin Basil 8. Acute chest wall pain secondary to rib fractures a. Pain control and follow-up in the outpatient setting with orthopedics b. Do not suspect any operative intervention 9. Hypothyroidism-T4 this admission 1.3 TSH 8.6 a. Unclear how to interpret this in the setting of hospitalization b. Repeat labs in the outpatient setting 10. BMI 24 11. Acute urinary retention 8/20 a. Needed to be in and out catheter overnight 8/24 b. Get bladder scan now  DVT prophylaxis: Lovenox Code Status: Full Family Communication: None at the bedside I called the family daughter on telephone number and updated 8/25 Disposition:   Status is: Inpatient  Remains inpatient appropriate because:Hemodynamically unstable, Persistent severe electrolyte disturbances, Ongoing active pain requiring inpatient pain management and Ongoing diagnostic testing needed not appropriate for outpatient work up   Dispo: The patient is from: Home              Anticipated d/c is to: SNF              Anticipated d/c date is: 2 days              Patient currently is not medically stable to d/c.    Consultants:   NONE CURRENTLY--nep  Procedures: xrays  Antimicrobials: none    Subjective: Awake alert Some anxiety this am but is better now No cp Some Issues with arm and ribs   Objective: Vitals:   03/11/20 1239 03/11/20 1909 03/11/20 2222 03/12/20 0609  BP: (!) 143/58 128/74 133/71 (!) 151/69  Pulse: 92 99 97 95  Resp: 20 20 18 20    Temp: 98 F (36.7 C) 99 F (37.2 C) 99.6 F (37.6 C) 97.8 F (36.6 C)  TempSrc: Oral Oral Oral Oral  SpO2: 94% 93% 93% 94%  Weight:      Height:        Intake/Output Summary (Last 24 hours) at 03/12/2020 0739 Last data filed at 03/12/2020 0300 Gross per 24 hour  Intake 960 ml  Output 800 ml  Net 160 ml   Filed Weights   03/05/20 0848 03/05/20 2018 03/07/20 0349  Weight: 68 kg 68 kg 71.7 kg    Examination:  General exam: Awake alert coherent quite anxious Respiratory system: Chest clear no added sound no rales Cardiovascular system: S1-S2 no murmur rub or gallop on monitors heart rate 130 at times but resolves Gastrointestinal system: Soft nontender no rebound no guarding. Central nervous system: Intact moving all 4 limbs equally Extremities: No lower extremity edema Skin: No rash Psychiatry: Euthymic congruent  Data Reviewed: I have personally reviewed following labs and imaging studies Sodium 127 CO2 18 BUN/creatinine down from 21/0.8-70/1.01  Radiology Studies: No results found.   Scheduled Meds: . acetaminophen  1,000 mg Oral Q8H  . amLODipine  10 mg Oral Daily  . cephALEXin  500 mg Oral Q12H  . Chlorhexidine Gluconate Cloth  6 each Topical Daily  . cycloSPORINE  1 drop Both Eyes BID  . enoxaparin (LOVENOX) injection  40 mg Subcutaneous Q24H  . hydrALAZINE  25 mg Oral Q8H  . levothyroxine  50 mcg Oral Q0600  . sodium chloride  2 g Oral BID WC   Continuous Infusions:   LOS: 7 days    Time spent: [place], MD [facility] To contact the attending provider between 7A-7P or the covering provider during after hours 7P-7A, please log into the web site [email or web address] and access using universal [place] password for that web site. If you do not have the password, please call the hospital operator.  03/12/2020, 7:39 AM

## 2020-03-12 NOTE — TOC Initial Note (Signed)
Transition of Care [facility]) - Initial/Assessment Note    Patient Details  Name: Christy Wade MRN: [id number] Date of Birth: 1935-09-12  Transition of Care [facility]) CM/SW Contact:    Dessa Phi, RN Phone Number: 03/12/2020, 9:40 AM  Clinical Narrative: Once medically stable will d/c to [place] aware-covid 8/24 neg(good for 24-48hrs of d/c).                  Expected Discharge Plan: Skilled Nursing Facility Barriers to Discharge: Continued Medical Work up   Patient Goals and CMS Choice Patient states their goals for this hospitalization and ongoing recovery are:: to go back home CMS Medicare.gov Compare Post Acute Care list provided to:: Patient    Expected Discharge Plan and Services Expected Discharge Plan: [place]   Discharge Planning Services: CM Consult   Living arrangements for the past 2 months: Single Family Home                                      Prior Living Arrangements/Services Living arrangements for the past 2 months: Single Family Home Lives with:: Self                   Activities of Daily Living Home Assistive Devices/Equipment: Bedside commode/3-in-1, Cane (specify quad or straight), Eyeglasses, Other (Comment) (single point cane, tub/shower unit, walk-in shower) ADL Screening (condition at time of admission) Patient's cognitive ability adequate to safely complete daily activities?: Yes Is the patient deaf or have difficulty hearing?: No Does the patient have difficulty seeing, even when wearing glasses/contacts?: No Does the patient have difficulty concentrating, remembering, or making decisions?: No Patient able to express need for assistance with ADLs?: Yes Does the patient have difficulty dressing or bathing?: Yes Independently performs ADLs?: No Communication: Independent Dressing (OT): Needs assistance Is this a change from baseline?: Change from baseline, expected to last >3 days Grooming: Needs  assistance Is this a change from baseline?: Change from baseline, expected to last >3 days Feeding: Needs assistance Is this a change from baseline?: Change from baseline, expected to last >3 days Bathing: Needs assistance Is this a change from baseline?: Change from baseline, expected to last >3 days Toileting: Needs assistance Is this a change from baseline?: Change from baseline, expected to last >3days In/Out Bed: Needs assistance Is this a change from baseline?: Change from baseline, expected to last >3 days Walks in Home: Needs assistance Is this a change from baseline?: Change from baseline, expected to last >3 days Does the patient have difficulty walking or climbing stairs?: Yes (secondary to weakness) Weakness of Legs: Both Weakness of Arms/Hands: None  Permission Sought/Granted                  Emotional Assessment              Admission diagnosis:  Hyponatremia [E87.1] Closed fracture of multiple ribs of right side, initial encounter [S22.41XA] Patient Active Problem List   Diagnosis Date Noted  . Multiple fractures of ribs, right side, subsequent encounter for fracture with routine healing 03/06/2020  . Hyponatremia 03/05/2020  . Diarrhea 07/24/2018  . Vaginitis and vulvovaginitis 05/22/2016  . Well adult exam 10/01/2015  . Chronic chest wall pain 09/09/2014  . Mitral valve regurgitation 06/21/2012  . Essential hypertension 01/03/2008  . Osteopenia 03/28/2007  . Hypothyroidism 02/16/2007   PCP:  Ronnald Nian, DO Pharmacy:  [id number] [address] [place] [address] Phone: [phone] Fax: [phone]     Social Determinants of Health (SDOH) Interventions    Readmission Risk Interventions No flowsheet data found.

## 2020-03-13 LAB — BASIC METABOLIC PANEL
Anion gap: 9 (ref 5–15)
BUN: 18 mg/dL (ref 8–23)
CO2: 18 mmol/L — ABNORMAL LOW (ref 22–32)
Calcium: 8.1 mg/dL — ABNORMAL LOW (ref 8.9–10.3)
Chloride: 101 mmol/L (ref 98–111)
Creatinine, Ser: 0.91 mg/dL (ref 0.44–1.00)
GFR calc Af Amer: >60 mL/min (ref >60)
GFR calc non Af Amer: 59 mL/min — ABNORMAL LOW (ref >60)
Glucose, Bld: 107 mg/dL — ABNORMAL HIGH (ref 70–99)
Potassium: 4.6 mmol/L (ref 3.5–5.1)
Sodium: 128 mmol/L — ABNORMAL LOW (ref 135–145)

## 2020-03-13 NOTE — Progress Notes (Signed)
Occupational Therapy Progress Note  Patient require increased time to get OOB this morning, feels stiff from not moving. Patient also reports feeling weak "I haven't had anything to drink or eat" however pt on 1000 fluid restriction and RN reports assisting patient with ordering breakfast this AM. Patient min A with bed mobility for trunk support. Min A to power up to standing with cues for hand placement and mod A with rolling walker to transfer to recliner with poor eccentric control into chair. Patient set up for g/h, UB bathing and mod A for LB bathing. Patient agreeable to D/C plan "I can't go home like this."    03/13/20 0900  OT Visit Information  Last OT Received On 03/13/20  Assistance Needed +1  History of Present Illness The patient is a 84 yr old woman who presented to [facility] ED on 03/05/2020 after a fall at home. The patient began having n/v/d on 03/01/2020. She was seen at an urgent care and was given zofran and sent home. On 03/05/2020, the patient got out of bed, and fell, denies hitting her head. She was unable to get up on her own due to weakness. Of note, HCTZ was added to this patient's antihypertensive regimen on 02/29/2020. Patient admitted for hypotnatremia and found toh ave Rt 7-9 rib fractures and Rt shoulder AC joint offset with seperated shoudler. PMH significant for HTN, hypothyroidism, osteoporosis, anemia.   Precautions  Precautions Fall  Pain Assessment  Pain Assessment Faces  Faces Pain Scale 2  Pain Location R hip/flank  Pain Descriptors / Indicators Sore  Pain Intervention(s) Monitored during session  Cognition  Arousal/Alertness Awake/alert  Behavior During Therapy WFL for tasks assessed/performed  Overall Cognitive Status No family/caregiver present to determine baseline cognitive functioning  General Comments patient states she's had nothing to eat/drink yet is on 1000 ml fluid restriction and RN reports she helped pt order her breakfast this morning  ADL  Overall  ADL's  Needs assistance/impaired  Grooming Oral care;Wash/dry face;Brushing hair;Set up;Sitting  Grooming Details (indicate cue type and reason) in recliner  Upper Body Bathing Set up;Sitting  Upper Body Bathing Details (indicate cue type and reason) in recliner  Lower Body Bathing Moderate assistance;Sitting/lateral leans  Lower Body Bathing Details (indicate cue type and reason) patient able to wash peri area and upper legs, require assist for lower legs  Upper Body Dressing  Set up;Sitting  Upper Body Dressing Details (indicate cue type and reason) to don clean gown  Toilet Transfer Moderate assistance;Cueing for safety;Cueing for sequencing;Stand-pivot;RW;BSC  Toilet Transfer Details (indicate cue type and reason) to recliner  Functional mobility during ADLs Moderate assistance;Cueing for safety;Cueing for sequencing;Rolling walker  Bed Mobility  Overal bed mobility Needs Assistance  Bed Mobility Supine to Sit  Supine to sit Min assist;HOB elevated  General bed mobility comments increased time, able to mobilize LEs to EOB and min A for trunk mobility  Balance  Overall balance assessment Needs assistance;History of Falls  Sitting-balance support Feet supported  Sitting balance-Leahy Scale Fair  Standing balance support Bilateral upper extremity supported;During functional activity  Standing balance-Leahy Scale Poor  Restrictions  Weight Bearing Restrictions No  RUE Weight Bearing WBAT  Other Position/Activity Restrictions confirmed WBAT R UE  Transfers  Overall transfer level Needs assistance  Equipment used Rolling walker (2 wheeled)  Transfers Sit to/from Stand;Stand Pivot Transfers  Sit to Stand Min assist  Stand pivot transfers Mod assist  General transfer comment mulitmodal cues for hand placement, assist to power up to  standing and steady before performing stand pivot. mod A for stability and eccentric control into chair  General Comments  General comments (skin integrity,  edema, etc.) HR 105 in bed 111-115 with activity  OT - End of Session  Equipment Utilized During Treatment Rolling walker  Activity Tolerance Patient tolerated treatment well  Patient left in chair;with call bell/phone within reach;with chair alarm set  Nurse Communication Mobility status  OT Assessment/Plan  OT Plan Discharge plan remains appropriate  OT Visit Diagnosis Other abnormalities of gait and mobility (R26.89);History of falling (Z91.81);Pain  Pain - Right/Left Right  Pain - part of body Hip (ribs)  OT Frequency (ACUTE ONLY) Min 2X/week  Follow Up Recommendations SNF  OT Equipment 3 in 1 bedside commode  AM-PAC OT "6 Clicks" Daily Activity Outcome Measure (Version 2)  Help from another person eating meals? 3  Help from another person taking care of personal grooming? 3  Help from another person toileting, which includes using toliet, bedpan, or urinal? 2  Help from another person bathing (including washing, rinsing, drying)? 2  Help from another person to put on and taking off regular upper body clothing? 3  Help from another person to put on and taking off regular lower body clothing? 2  6 Click Score 15  OT Goal Progression  Progress towards OT goals Progressing toward goals  Acute Rehab OT Goals  Patient Stated Goal get back home  OT Goal Formulation With patient  Time For Goal Achievement 03/21/20  Potential to Achieve Goals Good  ADL Goals  Pt Will Perform Lower Body Dressing with min guard assist;sit to/from stand;sitting/lateral leans  Pt Will Transfer to Toilet with min guard assist;bedside commode;ambulating (walker)  Pt Will Perform Toileting - Clothing Manipulation and hygiene with min guard assist;sitting/lateral leans;sit to/from stand  Additional ADL Goal #1 patient will be min G assist for bed mobility in preparation for self care tasks.  Additional ADL Goal #2 Patient will be min G assist for dynamic standing balance for 5 minutes in order to participate  in self care tasks.  OT Time Calculation  OT Start Time (ACUTE ONLY) 0725  OT Stop Time (ACUTE ONLY) 0813  OT Time Calculation (min) 48 min  OT General Charges  $OT Visit 1 Visit  OT Treatments  $Self Care/Home Management  38-52 mins   Delbert Phenix OT OT pager: [phone]

## 2020-03-13 NOTE — Progress Notes (Signed)
Patient tearful when informed about the need for COVID swab, also appeared to be a little short of breath. Vital signs were stable. Dr. Verlon Au aware and advised to increase vitals sign to every 4 hours. Tamika, night shift RN made aware.

## 2020-03-13 NOTE — Progress Notes (Signed)
PROGRESS NOTE    Christy Wade  QHU:[id number] DOB: [birth year] DOA: 03/05/2020 PCP: Ronnald Nian, DO  Brief Narrative:  84 year old white female HTN hypothyroid osteoporosis status post 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 (pathology of mass was ulcer-all nodes negative for cancer) Intermittent bilateral tremor, chronic diarrhea, osteopenia  Worsening nausea vomiting around 8/14 with diarrhea went to urgent care prior to coming to the hospital given Zofran ODT sent home status post fall day of admission 8/18 Recently switched from lisinopril to lisinopril HCTZ by primary care physician  On admission sodium 111 (baseline 133), chloride 78, bicarb 18, anion gap 15 Creatinine up from baseline of 1.1-1.26 WBC 16 CXR 7 -9 ribs showed fractures--also has a seperated R shoulder which is deeemd nonoperable  Initially Rx normal saline-however sodium dropped to 109-nephrology consulted-transfer to ICU-placed on 3% by nephrology  Eventually patient was transferred back to the floor-sodium has not resolved completely    Assessment & Plan:   Principal Problem:   Hyponatremia Active Problems:   Hypothyroidism   Essential hypertension   Chronic chest wall pain   Diarrhea   Multiple fractures of ribs, right side, subsequent encounter for fracture with routine healing   1. Severe hyponatremia/hypochloremia on admission a. Transferred out from ICU 8/24 and has stabilized b. Sodium improved with 1000 cc fluid restriction to 129 c. If sustains in the 130 range above over the next day or so can discharge 2. Fall with rib fractures 7 through 9 a. Pain control with OxyIR every 4 as needed--moderate pain with IV Dilaudid if needed b. Covid was -8/24 3. ?  Prior viral gastroenteritis with metabolic acidosis a. Still probably having some reequilibration b. Creatinine is stable c. Labs in am 4. Klebsiella pneumonia UTI  a. Continue Keflex, discontinue on 8/26 b. Blood  cultures were not performed on admission 5. HTN a. Continue Norvasc 10 daily, hydralazine 25 3 times daily, 8/26 add low-dose Coreg given not controlled b. Lisinopril and lisinopril HCTZ held from admission-see HPI 6. DM TY 2 a. Not on home meds? 7. 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 Griffin Basil 8. Acute chest wall pain secondary to rib fractures a. Pain control and follow-up in the outpatient setting with orthopedics b. Do not suspect any operative intervention 9. Hypothyroidism-T4 this admission 1.3 TSH 8.6 a. Unclear how to interpret this in the setting of hospitalization b. Repeat labs in the outpatient setting 10. BMI 24 11. Acute urinary retention 8/20 a. Needed to be in and out catheter overnight 8/24 b. Bladder scan showed retention on 8/25 c. Will need to go home with catheter and outpatient urology follow-up  DVT prophylaxis: Lovenox Code Status: Full Family Communication: Called family and updated on 8/25 in detail Disposition:   Status is: Inpatient  Remains inpatient appropriate because:Hemodynamically unstable, Persistent severe electrolyte disturbances, Ongoing active pain requiring inpatient pain management and Ongoing diagnostic testing needed not appropriate for outpatient work up   Dispo: The patient is from: Home              Anticipated d/c is to: SNF              Anticipated d/c date is: 1 day              Patient currently is not medically stable to d/c.    Consultants:   NONE CURRENTLY--nep  Procedures: xrays  Antimicrobials: none    Subjective: Awake coherent no distress sitting out of the bed without any  issues Happier and less anxious appearing in nad  Objective: Vitals:   03/12/20 1412 03/12/20 2107 03/13/20 0527 03/13/20 0944  BP: (!) 140/59 (!) 145/75 (!) 159/74 (!) 142/68  Pulse: 97 96 (!) 108 96  Resp: (!) 22 18 20    Temp: 98.1 F (36.7 C) 98.8 F (37.1 C) 98 F (36.7 C)   TempSrc: Oral Oral Oral   SpO2: 93%  95% 94%   Weight:      Height:        Intake/Output Summary (Last 24 hours) at 03/13/2020 1126 Last data filed at 03/13/2020 1000 Gross per 24 hour  Intake 810 ml  Output 1400 ml  Net -590 ml   Filed Weights   03/05/20 0848 03/05/20 2018 03/07/20 0349  Weight: 68 kg 68 kg 71.7 kg    Examination:  General exam: Awake alert coherent Respiratory system: Chest clear no added sound no rales Cardiovascular system: S1-S2 no murmur rub or gallop Gastrointestinal system: Soft nontender no rebound no guarding. Central nervous system: Intact moving all 4 limbs equally Extremities: No lower extremity edema Skin: No rash Psychiatry: Euthymic congruent  Data Reviewed: I have personally reviewed following labs and imaging studies Sodium 127-->129  Radiology Studies: No results found.   Scheduled Meds: . acetaminophen  1,000 mg Oral Q8H  . amLODipine  10 mg Oral Daily  . Chlorhexidine Gluconate Cloth  6 each Topical Daily  . cycloSPORINE  1 drop Both Eyes BID  . enoxaparin (LOVENOX) injection  40 mg Subcutaneous Q24H  . hydrALAZINE  25 mg Oral Q8H  . levothyroxine  50 mcg Oral Q0600  . potassium chloride  40 mEq Oral BID  . sodium chloride  2 g Oral BID WC   Continuous Infusions:   LOS: 8 days    Time spent: 40  Nita Sells, MD [facility] To contact the attending provider between 7A-7P or the covering provider during after hours 7P-7A, please log into the web site [email or web address] and access using universal [place] password for that web site. If you do not have the password, please call the hospital operator.  03/13/2020, 11:26 AM

## 2020-03-13 NOTE — Progress Notes (Signed)
Physical Therapy Treatment Patient Details Name: Christy Wade MRN: [id number] DOB: February 14, 1936 Today's Date: 03/13/2020    History of Present Illness The patient is a 84 yr old woman who presented to [facility] ED on 03/05/2020 after a fall at home. The patient began having n/v/d on 03/01/2020. She was seen at an urgent care and was given zofran and sent home. On 03/05/2020, the patient got out of bed, and fell, denies hitting her head. She was unable to get up on her own due to weakness. Of note, HCTZ was added to this patient's antihypertensive regimen on 02/29/2020. Patient admitted for hypotnatremia and found toh ave Rt 7-9 rib fractures and Rt shoulder AC joint offset with seperated shoudler. PMH significant for HTN, hypothyroidism, osteoporosis, anemia.     PT Comments    Pt is slowly progressing with mobility, she ambulated 14' x 2 with RW with seated rest break. Distance limited by fatigue and 3/4 dyspnea.  HR 110 at rest and 127 with ambulation.  Activity tolerance is significantly decreased, SNF recommended.    Follow Up Recommendations  SNF     Equipment Recommendations  None recommended by PT    Recommendations for Other Services       Precautions / Restrictions Precautions Precautions: Fall Restrictions Weight Bearing Restrictions: No Other Position/Activity Restrictions: confirmed WBAT R UE    Mobility  Bed Mobility               General bed mobility comments: up in recliner  Transfers Overall transfer level: Needs assistance Equipment used: Rolling walker (2 wheeled) Transfers: Sit to/from Stand Sit to Stand: Min assist         General transfer comment: min A to power up, VCs hand placement  Ambulation/Gait   Gait Distance (Feet): 14 Feet (14' x 2 with seated rest break) Assistive device: Rolling walker (2 wheeled) Gait Pattern/deviations: Decreased step length - right;Decreased step length - left;Decreased stride length;Step-to pattern;Step-through  pattern Gait velocity: decr   General Gait Details: HR 127 with walking, distance limited by fatigue, 14' x 2 with seated rest break, HR 110 at rest   Stairs             Wheelchair Mobility    Modified Rankin (Stroke Patients Only)       Balance Overall balance assessment: Needs assistance;History of Falls Sitting-balance support: Feet supported Sitting balance-Leahy Scale: Fair     Standing balance support: Bilateral upper extremity supported Standing balance-Leahy Scale: Poor                              Cognition Arousal/Alertness: Awake/alert Behavior During Therapy: WFL for tasks assessed/performed Overall Cognitive Status: No family/caregiver present to determine baseline cognitive functioning                                 General Comments: pt repeats statements multiple times, able to follow commands and is oriented x 4      Exercises General Exercises - Lower Extremity Ankle Circles/Pumps: AROM;Both;10 reps;Supine Quad Sets: AROM;Both;5 reps;Supine Hip Flexion/Marching: AROM;Both;5 reps;Standing Mini-Sqauts: AROM;5 reps;Both    General Comments        Pertinent Vitals/Pain Pain Score: 3  Pain Location: R hip/flank Pain Descriptors / Indicators: Sore Pain Intervention(s): Limited activity within patient's tolerance;Monitored during session;Premedicated before session    Home Living  Prior Function            PT Goals (current goals can now be found in the care plan section) Acute Rehab PT Goals Patient Stated Goal: get back home, get strength back PT Goal Formulation: With patient Time For Goal Achievement: 03/21/20 Potential to Achieve Goals: Good Progress towards PT goals: Progressing toward goals    Frequency    Min 3X/week      PT Plan Current plan remains appropriate    Co-evaluation              AM-PAC PT "6 Clicks" Mobility   Outcome Measure  Help needed  turning from your back to your side while in a flat bed without using bedrails?: A Lot Help needed moving from lying on your back to sitting on the side of a flat bed without using bedrails?: A Lot Help needed moving to and from a bed to a chair (including a wheelchair)?: A Little Help needed standing up from a chair using your arms (e.g., wheelchair or bedside chair)?: A Little Help needed to walk in hospital room?: A Little Help needed climbing 3-5 steps with a railing? : A Lot 6 Click Score: 15    End of Session Equipment Utilized During Treatment: Gait belt Activity Tolerance: Patient limited by fatigue;Patient tolerated treatment well Patient left: in chair;with call bell/phone within reach;with chair alarm set Nurse Communication: Mobility status PT Visit Diagnosis: Muscle weakness (generalized) (M62.81);Other abnormalities of gait and mobility (R26.89);Unsteadiness on feet (R26.81);Difficulty in walking, not elsewhere classified (R26.2)     Time: 8984-2103 PT Time Calculation (min) (ACUTE ONLY): 27 min  Charges:  $Gait Training: 8-22 mins $Therapeutic Exercise: 8-22 mins                    Blondell Reveal Kistler PT 03/13/2020  Acute Rehabilitation Services Pager [phone] Office [phone]

## 2020-03-14 DIAGNOSIS — R0902 Hypoxemia: Secondary | ICD-10-CM | POA: Diagnosis not present

## 2020-03-14 DIAGNOSIS — I340 Nonrheumatic mitral (valve) insufficiency: Secondary | ICD-10-CM | POA: Diagnosis not present

## 2020-03-14 DIAGNOSIS — E871 Hypo-osmolality and hyponatremia: Secondary | ICD-10-CM | POA: Diagnosis not present

## 2020-03-14 DIAGNOSIS — I5033 Acute on chronic diastolic (congestive) heart failure: Secondary | ICD-10-CM | POA: Diagnosis not present

## 2020-03-14 DIAGNOSIS — E119 Type 2 diabetes mellitus without complications: Secondary | ICD-10-CM | POA: Diagnosis not present

## 2020-03-14 DIAGNOSIS — Z66 Do not resuscitate: Secondary | ICD-10-CM | POA: Diagnosis not present

## 2020-03-14 DIAGNOSIS — S2241XA Multiple fractures of ribs, right side, initial encounter for closed fracture: Secondary | ICD-10-CM | POA: Diagnosis not present

## 2020-03-14 DIAGNOSIS — E8770 Fluid overload, unspecified: Secondary | ICD-10-CM | POA: Diagnosis not present

## 2020-03-14 DIAGNOSIS — S43101A Unspecified dislocation of right acromioclavicular joint, initial encounter: Secondary | ICD-10-CM | POA: Diagnosis not present

## 2020-03-14 DIAGNOSIS — Z823 Family history of stroke: Secondary | ICD-10-CM | POA: Diagnosis not present

## 2020-03-14 DIAGNOSIS — R52 Pain, unspecified: Secondary | ICD-10-CM | POA: Diagnosis not present

## 2020-03-14 DIAGNOSIS — Z7401 Bed confinement status: Secondary | ICD-10-CM | POA: Diagnosis not present

## 2020-03-14 DIAGNOSIS — E039 Hypothyroidism, unspecified: Secondary | ICD-10-CM | POA: Diagnosis not present

## 2020-03-14 DIAGNOSIS — I509 Heart failure, unspecified: Secondary | ICD-10-CM | POA: Diagnosis present

## 2020-03-14 DIAGNOSIS — R2689 Other abnormalities of gait and mobility: Secondary | ICD-10-CM | POA: Diagnosis not present

## 2020-03-14 DIAGNOSIS — Z803 Family history of malignant neoplasm of breast: Secondary | ICD-10-CM | POA: Diagnosis not present

## 2020-03-14 DIAGNOSIS — M2550 Pain in unspecified joint: Secondary | ICD-10-CM | POA: Diagnosis not present

## 2020-03-14 DIAGNOSIS — Z9181 History of falling: Secondary | ICD-10-CM | POA: Diagnosis not present

## 2020-03-14 DIAGNOSIS — Z833 Family history of diabetes mellitus: Secondary | ICD-10-CM | POA: Diagnosis not present

## 2020-03-14 DIAGNOSIS — R0602 Shortness of breath: Secondary | ICD-10-CM | POA: Diagnosis not present

## 2020-03-14 DIAGNOSIS — R41841 Cognitive communication deficit: Secondary | ICD-10-CM | POA: Diagnosis not present

## 2020-03-14 DIAGNOSIS — S43004D Unspecified dislocation of right shoulder joint, subsequent encounter: Secondary | ICD-10-CM | POA: Diagnosis not present

## 2020-03-14 DIAGNOSIS — S2249XA Multiple fractures of ribs, unspecified side, initial encounter for closed fracture: Secondary | ICD-10-CM | POA: Diagnosis not present

## 2020-03-14 DIAGNOSIS — Z789 Other specified health status: Secondary | ICD-10-CM | POA: Diagnosis not present

## 2020-03-14 DIAGNOSIS — B370 Candidal stomatitis: Secondary | ICD-10-CM | POA: Diagnosis not present

## 2020-03-14 DIAGNOSIS — S2231XA Fracture of one rib, right side, initial encounter for closed fracture: Secondary | ICD-10-CM | POA: Diagnosis not present

## 2020-03-14 DIAGNOSIS — I351 Nonrheumatic aortic (valve) insufficiency: Secondary | ICD-10-CM | POA: Diagnosis not present

## 2020-03-14 DIAGNOSIS — J9601 Acute respiratory failure with hypoxia: Secondary | ICD-10-CM | POA: Diagnosis not present

## 2020-03-14 DIAGNOSIS — I361 Nonrheumatic tricuspid (valve) insufficiency: Secondary | ICD-10-CM | POA: Diagnosis not present

## 2020-03-14 DIAGNOSIS — S2241XD Multiple fractures of ribs, right side, subsequent encounter for fracture with routine healing: Secondary | ICD-10-CM | POA: Diagnosis not present

## 2020-03-14 DIAGNOSIS — M810 Age-related osteoporosis without current pathological fracture: Secondary | ICD-10-CM | POA: Diagnosis present

## 2020-03-14 DIAGNOSIS — J90 Pleural effusion, not elsewhere classified: Secondary | ICD-10-CM | POA: Diagnosis not present

## 2020-03-14 DIAGNOSIS — R2681 Unsteadiness on feet: Secondary | ICD-10-CM | POA: Diagnosis not present

## 2020-03-14 DIAGNOSIS — R569 Unspecified convulsions: Secondary | ICD-10-CM | POA: Diagnosis not present

## 2020-03-14 DIAGNOSIS — M6281 Muscle weakness (generalized): Secondary | ICD-10-CM | POA: Diagnosis not present

## 2020-03-14 DIAGNOSIS — J439 Emphysema, unspecified: Secondary | ICD-10-CM | POA: Diagnosis not present

## 2020-03-14 DIAGNOSIS — R7401 Elevation of levels of liver transaminase levels: Secondary | ICD-10-CM | POA: Diagnosis present

## 2020-03-14 DIAGNOSIS — J810 Acute pulmonary edema: Secondary | ICD-10-CM | POA: Diagnosis not present

## 2020-03-14 DIAGNOSIS — J9811 Atelectasis: Secondary | ICD-10-CM | POA: Diagnosis not present

## 2020-03-14 DIAGNOSIS — E876 Hypokalemia: Secondary | ICD-10-CM | POA: Diagnosis not present

## 2020-03-14 DIAGNOSIS — J069 Acute upper respiratory infection, unspecified: Secondary | ICD-10-CM | POA: Diagnosis not present

## 2020-03-14 DIAGNOSIS — S2249XS Multiple fractures of ribs, unspecified side, sequela: Secondary | ICD-10-CM | POA: Diagnosis not present

## 2020-03-14 DIAGNOSIS — R197 Diarrhea, unspecified: Secondary | ICD-10-CM | POA: Diagnosis not present

## 2020-03-14 DIAGNOSIS — Z20822 Contact with and (suspected) exposure to covid-19: Secondary | ICD-10-CM | POA: Diagnosis not present

## 2020-03-14 DIAGNOSIS — W19XXXD Unspecified fall, subsequent encounter: Secondary | ICD-10-CM | POA: Diagnosis present

## 2020-03-14 DIAGNOSIS — R0789 Other chest pain: Secondary | ICD-10-CM | POA: Diagnosis not present

## 2020-03-14 DIAGNOSIS — I10 Essential (primary) hypertension: Secondary | ICD-10-CM | POA: Diagnosis not present

## 2020-03-14 DIAGNOSIS — Z79899 Other long term (current) drug therapy: Secondary | ICD-10-CM | POA: Diagnosis not present

## 2020-03-14 DIAGNOSIS — G8929 Other chronic pain: Secondary | ICD-10-CM | POA: Diagnosis not present

## 2020-03-14 DIAGNOSIS — R5381 Other malaise: Secondary | ICD-10-CM | POA: Diagnosis not present

## 2020-03-14 DIAGNOSIS — Z79890 Hormone replacement therapy (postmenopausal): Secondary | ICD-10-CM | POA: Diagnosis not present

## 2020-03-14 LAB — BASIC METABOLIC PANEL
Anion gap: 11 (ref 5–15)
BUN: 18 mg/dL (ref 8–23)
CO2: 18 mmol/L — ABNORMAL LOW (ref 22–32)
Calcium: 8.2 mg/dL — ABNORMAL LOW (ref 8.9–10.3)
Chloride: 99 mmol/L (ref 98–111)
Creatinine, Ser: 0.86 mg/dL (ref 0.44–1.00)
GFR calc Af Amer: >60 mL/min (ref >60)
GFR calc non Af Amer: >60 mL/min (ref >60)
Glucose, Bld: 90 mg/dL (ref 70–99)
Potassium: 4.7 mmol/L (ref 3.5–5.1)
Sodium: 128 mmol/L — ABNORMAL LOW (ref 135–145)

## 2020-03-14 LAB — SARS CORONAVIRUS 2 (TAT 6-24 HRS): SARS Coronavirus 2: NEGATIVE

## 2020-03-14 MED ORDER — OXYCODONE HCL 5 MG PO TABS
5.0000 mg | ORAL_TABLET | ORAL | 0 refills | Status: DC | PRN
Start: 2020-03-14 — End: 2020-04-14

## 2020-03-14 MED ORDER — HYDRALAZINE HCL 25 MG PO TABS
25.0000 mg | ORAL_TABLET | Freq: Three times a day (TID) | ORAL | Status: DC
Start: 2020-03-14 — End: 2020-04-24

## 2020-03-14 MED ORDER — ACETAMINOPHEN 500 MG PO TABS
1000.0000 mg | ORAL_TABLET | Freq: Three times a day (TID) | ORAL | 0 refills | Status: DC
Start: 2020-03-14 — End: 2020-03-28

## 2020-03-14 MED ORDER — HYDROXYZINE HCL 10 MG PO TABS: 10.0000 mg | ORAL_TABLET | Freq: Three times a day (TID) | ORAL | 0 refills | Status: DC | PRN

## 2020-03-14 MED ORDER — AMLODIPINE BESYLATE 10 MG PO TABS
10.0000 mg | ORAL_TABLET | Freq: Every day | ORAL | Status: DC
Start: 2020-03-15 — End: 2020-04-04

## 2020-03-14 MED ORDER — POTASSIUM CHLORIDE CRYS ER 20 MEQ PO TBCR
40.0000 meq | EXTENDED_RELEASE_TABLET | Freq: Two times a day (BID) | ORAL | Status: DC
Start: 2020-03-14 — End: 2020-03-28

## 2020-03-14 MED ORDER — TRAMADOL HCL 50 MG PO TABS
50.0000 mg | ORAL_TABLET | Freq: Three times a day (TID) | ORAL | 0 refills | Status: DC | PRN
Start: 2020-03-14 — End: 2020-03-31

## 2020-03-14 MED ORDER — SODIUM CHLORIDE 1 G PO TABS
2.0000 g | ORAL_TABLET | Freq: Two times a day (BID) | ORAL | Status: DC
Start: 2020-03-14 — End: 2020-03-28

## 2020-03-14 NOTE — Progress Notes (Signed)
Report given to Olu at [facility]. Awaiting transport.

## 2020-03-14 NOTE — Discharge Summary (Signed)
Physician Discharge Summary  Christy Wade CWC:[id number] DOB: 27-Sep-1935 DOA: 03/05/2020  PCP: Ronnald Nian, DO  Admit date: 03/05/2020 Discharge date: 03/14/2020  Time spent: 6minutes  Recommendations for Outpatient Follow-up:  1. Will need sodium levels checked every 2 days for the next week and then can space out if sodium becomes normalized to the 1 30-1 35 range 2. Requires outpatient management possibly with nephrology-we will need sodium tablets until sodium normalizes and strict fluid restriction of 1000 cc a day until sodium is above 130 consistently 3. Requires use of sling and swath on right arm at all times when awake because of shoulder dislocation and should have follow-up set up by skilled facility physician with Dr. Griffin Basil of orthopedics 4. Please promote incentive spirometer every 2 hours at facility to prevent splinting or pain from rib fractures 5. Please work-up bilateral essential tremor further as per primary care physician-may initiate low-dose nonselective beta-blocker in the outpatient setting 6. Recheck TSH in about 3 weeks at facility or PCP office when patient is within normal limits 7. Pain control Tylenol then tramadol and oxycodone-please encourage patient to take medications as needed as she is resistant to taking opiates  8. Please trial voiding trial in about 1 week early in September at facility-if unsuccessful will need [facility] referral for urinary retention 9. We will be discharging with Foley catheter  Discharge Diagnoses:  Principal Problem:   Hyponatremia Active Problems:   Hypothyroidism   Essential hypertension   Chronic chest wall pain   Diarrhea   Multiple fractures of ribs, right side, subsequent encounter for fracture with routine healing   Discharge Condition: Improved  Diet recommendation: Regular full salt diet and supplement extra salt in the diet because of hyponatremia  Filed Weights   03/05/20 0848  03/05/20 2018 03/07/20 0349  Weight: 68 kg 68 kg 71.7 kg    History of present illness:  84 year old white female HTN hypothyroid osteoporosis status post 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 (pathology of mass was ulcer-all nodes negative for cancer) Intermittent bilateral tremor, chronic diarrhea, osteopenia  Worsening nausea vomiting around 8/14 with diarrhea went to urgent care prior to coming to the hospital given Zofran ODT sent home status post fall day of admission 8/18 Recently switched from lisinopril to lisinopril HCTZ by primary care physician  On admission sodium 111 (baseline 133), chloride 78, bicarb 18, anion gap 15 Creatinine up from baseline of 1.1-1.26 WBC 16 CXR 7 -9 ribs showed fractures--also has a seperated R shoulder which is deeemd nonoperable  Initially Rx normal saline-however sodium dropped to 109-nephrology consulted-transfer to ICU-placed on 3% by nephrology  Eventually patient was transferred back to the floor-sodium has not resolved completely   Hospital Course:  1. Severe hyponatremia/hypochloremia on admission a. Transferred out from ICU 8/24 and has stabilized b. Sodium improved with 1000 cc fluid restriction to 128 consistently and will need to continue salt tablets and 1000 cc fluid restriction c. Will need labs as above every 2 days until consistently about 120 9-1 32 and then can discontinue or cut back on salt tablets d. If questions please contact nephrologist Dr. Madelon Lips who saw the patient initially in consult or Dr. Roney Jaffe 2. Fall with rib fractures 7 through 9 a. Pain control with OxyIR every 4 as needed--can use tramadol instead b. Will need to also use I-S as as needed 3. ?  Prior viral gastroenteritis with metabolic acidosis a. Still probably having some reequilibration b. Creatinine is stable  c. Continue to check labs in the outpatient setting 4. Klebsiella pneumonia UTI  a. Continue Keflex,  discontinue on 8/26 and completed treatment b. Blood cultures were not performed on admission 5. HTN a. Continue Norvasc 10 daily, hydralazine 25 3 times daily, 8/26 add low-dose Coreg given not controlled b. Lisinopril and lisinopril HCTZ held from admission-see HPI 6. DM TY 2 a. Not on home meds?  Therefore do not work-up any further 7. 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 Griffin Basil 8. Acute chest wall pain secondary to rib fractures a. Pain control and follow-up in the outpatient setting with orthopedics b. Do not suspect any operative intervention 9. Hypothyroidism-T4 this admission 1.3 TSH 8.6 a. Unclear how to interpret this in the setting of hospitalization b. Repeat labs in the outpatient setting 10. BMI 24 11. Acute urinary retention 8/20 a. Needed to be in and out catheter overnight 8/24 b. Bladder scan showed retention on 8/25 c. Will need to go home with catheter and outpatient urology follow-up  Procedures:    Consultations:  Nephrologist  Discharge Exam: Vitals:   03/14/20 0253 03/14/20 0625  BP: (!) 158/72 (!) 156/76  Pulse: 100 98  Resp: 20 (!) 22  Temp: 98.2 F (36.8 C) 98.2 F (36.8 C)  SpO2: 92% 96%    General: Awake alert coherent no distress EOMI NCAT quite anxious but no other issues at this time Cardiovascular: S1-S2 no murmur rub or gallop  Respiratory: Clear no added sound No rales rhonchi, some bruising on the back of chest in addition to limited ROM to right shoulder Neurologically intact no focal deficit  Discharge Instructions   Discharge Instructions    Diet - low sodium heart healthy   Complete by: As directed    Increase activity slowly   Complete by: As directed      Allergies as of 03/14/2020      Reactions   Codeine Nausea Only   REACTION: nausea   Sulfamethoxazole Rash   REACTION: rash      Medication List    STOP taking these medications   CALCIUM-VITAMIN D PO   lisinopril 20 MG tablet Commonly  known as: ZESTRIL   multivitamin tablet     TAKE these medications   acetaminophen 500 MG tablet Commonly known as: TYLENOL Take 2 tablets (1,000 mg total) by mouth every 8 (eight) hours.   amLODipine 10 MG tablet Commonly known as: NORVASC Take 1 tablet (10 mg total) by mouth daily. Start taking on: March 15, 2020   cycloSPORINE 0.05 % ophthalmic emulsion Commonly known as: RESTASIS Place 1 drop into both eyes 2 (two) times daily.   hydrALAZINE 25 MG tablet Commonly known as: APRESOLINE Take 1 tablet (25 mg total) by mouth every 8 (eight) hours.   hydrOXYzine 10 MG tablet Commonly known as: ATARAX/VISTARIL Take 1 tablet (10 mg total) by mouth 3 (three) times daily as needed for anxiety.   levothyroxine 50 MCG tablet Commonly known as: SYNTHROID TAKE 1 TABLET BY MOUTH EVERY MORNING What changed:   how much to take  how to take this  when to take this  additional instructions   ondansetron 4 MG disintegrating tablet Commonly known as: ZOFRAN-ODT Take 4 mg by mouth daily as needed for nausea.   oxyCODONE 5 MG immediate release tablet Commonly known as: Oxy IR/ROXICODONE Take 1 tablet (5 mg total) by mouth every 4 (four) hours as needed for moderate pain.   potassium chloride SA 20 MEQ tablet  Commonly known as: KLOR-CON Take 2 tablets (40 mEq total) by mouth 2 (two) times daily.   sodium chloride 1 g tablet Take 2 tablets (2 g total) by mouth 2 (two) times daily with a meal.   traMADol 50 MG tablet Commonly known as: ULTRAM Take 1 tablet (50 mg total) by mouth every 8 (eight) hours as needed for moderate pain. What changed: See the new instructions.      Allergies  Allergen Reactions  . Codeine Nausea Only    REACTION: nausea  . Sulfamethoxazole Rash    REACTION: rash      The results of significant diagnostics from this hospitalization (including imaging, microbiology, ancillary and laboratory) are listed below for reference.    Significant  Diagnostic Studies: DG Shoulder Right  Result Date: 03/05/2020 CLINICAL DATA:  Fall with shoulder pain EXAM: RIGHT SHOULDER - 2+ VIEW COMPARISON:  None. FINDINGS: Posterior right seventh and eighth rib fractures. No visible pneumothorax but there is soft tissue gas on the lateral view and marked displacement of 1 of the fractures. Corticated defect in the acromion. Offset at the acromioclavicular joint by at least 50%. No fracture deformity. No coracoclavicular interval widening IMPRESSION: 1. AC joint offset suggesting separated shoulder. 2. Right rib fractures involving the seventh and eighth ribs at least. One of these fractures is markedly displaced and there is adjacent chest wall emphysema. No visible pneumothorax. 3. Os acromiale. Electronically Signed   By: Monte Fantasia M.D.   On: 03/05/2020 09:48   DG Chest Portable 1 View  Result Date: 03/05/2020 CLINICAL DATA:  Fall with rib fractures on shoulder radiograph EXAM: PORTABLE CHEST 1 VIEW COMPARISON:  11/10/2005 FINDINGS: Right seventh, eighth, and ninth rib fractures with prominent displacement. Streaky opacity at the right base. No visible effusion or pneumothorax. Mild cardiomegaly. Aortic tortuosity. Shoulder findings as on dedicated study IMPRESSION: 1. Right seventh through ninth rib fractures with prominent displacement. 2. Atelectasis at the right base.  No visible pneumothorax. Electronically Signed   By: Monte Fantasia M.D.   On: 03/05/2020 11:54    Microbiology: Recent Results (from the past 240 hour(s))  SARS Coronavirus 2 by RT PCR (hospital order, performed in [facility] hospital lab) Nasopharyngeal Nasopharyngeal Swab     Status: None   Collection Time: 03/05/20 12:06 PM   Specimen: Nasopharyngeal Swab  Result Value Ref Range Status   SARS Coronavirus 2 NEGATIVE NEGATIVE Final    Comment: (NOTE) SARS-CoV-2 target nucleic acids are NOT DETECTED.  The SARS-CoV-2 RNA is generally detectable in upper and lower respiratory  specimens during the acute phase of infection. The lowest concentration of SARS-CoV-2 viral copies this assay can detect is 250 copies / mL. A negative result does not preclude SARS-CoV-2 infection and should not be used as the sole basis for treatment or other patient management decisions.  A negative result may occur with improper specimen collection / handling, submission of specimen other than nasopharyngeal swab, presence of viral mutation(s) within the areas targeted by this assay, and inadequate number of viral copies (<250 copies / mL). A negative result must be combined with clinical observations, patient history, and epidemiological information.  Fact Sheet for Patients:   StrictlyIdeas.no  Fact Sheet for Healthcare Providers: BankingDealers.co.za  This test is not yet approved or  cleared by the [place] FDA and has been authorized for detection and/or diagnosis of SARS-CoV-2 by FDA under an Emergency Use Authorization (EUA).  This EUA will remain in effect (meaning this test can be  used) for the duration of the COVID-19 declaration under Section 564(b)(1) of the Act, 21 U.S.C. section 360bbb-3(b)(1), unless the authorization is terminated or revoked sooner.  Performed at [facility], [place] [address], [place], [place] [address]   MRSA PCR Screening     Status: None   Collection Time: 03/06/20 10:38 AM   Specimen: Nasal Mucosa; Nasopharyngeal  Result Value Ref Range Status   MRSA by PCR NEGATIVE NEGATIVE Final    Comment:        The GeneXpert MRSA Assay (FDA approved for NASAL specimens only), is one component of a comprehensive MRSA colonization surveillance program. It is not intended to diagnose MRSA infection nor to guide or monitor treatment for MRSA infections. Performed at [facility], [place] [address] [place], [place] [address]   Culture, Urine     Status: Abnormal    Collection Time: 03/06/20  2:37 PM   Specimen: Urine, Catheterized  Result Value Ref Range Status   Specimen Description   Final    URINE, CATHETERIZED Performed at [place] [address], [place], [place] [address]    Special Requests   Final    NONE Performed at [facility], [place] [address] [place], [place] [address]    Culture >=100,000 COLONIES/mL KLEBSIELLA PNEUMONIAE (A)  Final   Report Status 03/09/2020 FINAL  Final   Organism ID, Bacteria KLEBSIELLA PNEUMONIAE (A)  Final      Susceptibility   Klebsiella pneumoniae - MIC*    AMPICILLIN >=32 RESISTANT Resistant     CEFAZOLIN <=4 SENSITIVE Sensitive     CEFTRIAXONE <=0.25 SENSITIVE Sensitive     CIPROFLOXACIN <=0.25 SENSITIVE Sensitive     GENTAMICIN <=1 SENSITIVE Sensitive     IMIPENEM <=0.25 SENSITIVE Sensitive     NITROFURANTOIN 64 INTERMEDIATE Intermediate     TRIMETH/SULFA <=20 SENSITIVE Sensitive     AMPICILLIN/SULBACTAM 8 SENSITIVE Sensitive     PIP/TAZO 16 SENSITIVE Sensitive     * >=100,000 COLONIES/mL KLEBSIELLA PNEUMONIAE  SARS Coronavirus 2 by RT PCR (hospital order, performed in [place] hospital lab) Nasopharyngeal Nasopharyngeal Swab     Status: None   Collection Time: 03/11/20  5:51 PM   Specimen: Nasopharyngeal Swab  Result Value Ref Range Status   SARS Coronavirus 2 NEGATIVE NEGATIVE Final    Comment: (NOTE) SARS-CoV-2 target nucleic acids are NOT DETECTED.  The SARS-CoV-2 RNA is generally detectable in upper and lower respiratory specimens during the acute phase of infection. The lowest concentration of SARS-CoV-2 viral copies this assay can detect is 250 copies / mL. A negative result does not preclude SARS-CoV-2 infection and should not be used as the sole basis for treatment or other patient management decisions.  A negative result may occur with improper specimen collection / handling, submission of specimen other than nasopharyngeal swab,  presence of viral mutation(s) within the areas targeted by this assay, and inadequate number of viral copies (<250 copies / mL). A negative result must be combined with clinical observations, patient history, and epidemiological information.  Fact Sheet for Patients:   StrictlyIdeas.no  Fact Sheet for Healthcare Providers: BankingDealers.co.za  This test is not yet approved or  cleared by the [place] FDA and has been authorized for detection and/or diagnosis of SARS-CoV-2 by FDA under an Emergency Use Authorization (EUA).  This EUA will remain in effect (meaning this test can be used) for the duration of the COVID-19 declaration under Section 564(b)(1) of the Act, 21  U.S.C. section 360bbb-3(b)(1), unless the authorization is terminated or revoked sooner.  Performed at [facility], [place] [address], [place], [place] [address]   SARS CORONAVIRUS 2 (TAT 6-24 HRS) Nasopharyngeal Nasopharyngeal Swab     Status: None   Collection Time: 03/13/20 10:00 PM   Specimen: Nasopharyngeal Swab  Result Value Ref Range Status   SARS Coronavirus 2 NEGATIVE NEGATIVE Final    Comment: (NOTE) SARS-CoV-2 target nucleic acids are NOT DETECTED.  The SARS-CoV-2 RNA is generally detectable in upper and lower respiratory specimens during the acute phase of infection. Negative results do not preclude SARS-CoV-2 infection, do not rule out co-infections with other pathogens, and should not be used as the sole basis for treatment or other patient management decisions. Negative results must be combined with clinical observations, patient history, and epidemiological information. The expected result is Negative.  Fact Sheet for Patients: SugarRoll.be  Fact Sheet for Healthcare Providers: https://www.woods-mathews.com/  This test is not yet approved or cleared by the [place] FDA and  has  been authorized for detection and/or diagnosis of SARS-CoV-2 by FDA under an Emergency Use Authorization (EUA). This EUA will remain  in effect (meaning this test can be used) for the duration of the COVID-19 declaration under Se ction 564(b)(1) of the Act, 21 U.S.C. section 360bbb-3(b)(1), unless the authorization is terminated or revoked sooner.  Performed at [facility], [place] [address] [place], [place] [address]      Labs: Basic Metabolic Panel: Recent Labs  Lab 03/10/20 [phone] 03/10/20 0738 03/10/20 1143 03/10/20 1801 03/11/20 1040 03/11/20 1405 03/12/20 0519 03/12/20 0940 03/12/20 2201 03/13/20 1832 03/14/20 0748  NA 127*  128*   < > 127*   < > 130*   < > 127* 125* 129* 128* 128*  K 3.8  --   --   --  3.6  --  3.5  --   --  4.6 4.7  CL 101  --   --   --  100  --  97*  --   --  101 99  CO2 19*  --   --   --  20*  --  18*  --   --  18* 18*  GLUCOSE 96  --   --   --  138*  --  92  --   --  107* 90  BUN 36*  --   --   --  21  --  17  --   --  18 18  CREATININE 0.73  --   --   --  0.85  --  1.01*  --   --  0.91 0.86  CALCIUM 8.0*  --   --   --  8.4*  --  8.2*  --   --  8.1* 8.2*  MG  --   --  1.8  --   --   --   --   --   --   --   --    < > = values in this interval not displayed.   Liver Function Tests: No results for input(s): AST, ALT, ALKPHOS, BILITOT, PROT, ALBUMIN in the last 168 hours. No results for input(s): LIPASE, AMYLASE in the last 168 hours. No results for input(s): AMMONIA in the last 168 hours. CBC: Recent Labs  Lab 03/08/20 0520 03/09/20 0628 03/10/20 0738  WBC 13.0* 10.5 7.7  NEUTROABS 10.8* 7.5 4.7  HGB 11.5* 10.5* 10.4*  HCT 33.2* 29.8* 29.3*  MCV  87.6 87.1 87.5  PLT 146* 188 214   Cardiac Enzymes: No results for input(s): CKTOTAL, CKMB, CKMBINDEX, TROPONINI in the last 168 hours. BNP: BNP (last 3 results) No results for input(s): BNP in the last 8760 hours.  ProBNP (last 3 results) No results for input(s): PROBNP in the last  8760 hours.  CBG: No results for input(s): GLUCAP in the last 168 hours.     Signed:  Nita Sells MD   [facility] 03/14/2020, 9:37 AM

## 2020-03-14 NOTE — Progress Notes (Signed)
Off unit via with [facility] transport and sister. No s/s of acute distress noted

## 2020-03-14 NOTE — TOC Transition Note (Signed)
Transition of Care [facility]) - CM/SW Discharge Note   Patient Details  Name: Christy Wade MRN: [id number] Date of Birth: [birth year]  Transition of Care [facility] CM/SW Contact:  Servando Snare, LCSW Phone Number: 03/14/2020, 10:30 AM   Clinical Narrative:   Patient dc'ing to [facility] for rehab. Patient will dc by EMS. RN report #  [phone]. Patient to report to room 504.   RN please call patient's daughter Christy Wade [phone]) when EMS picks up patient.     Final next level of care: Skilled Nursing Facility Barriers to Discharge: No Barriers Identified   Patient Goals and CMS Choice Patient states their goals for this hospitalization and ongoing recovery are:: to go back home CMS Medicare.gov Compare Post Acute Care list provided to:: Patient Choice offered to / list presented to : Patient  Discharge Placement              Patient chooses bed at: [facility] Patient to be transferred to facility by: EMS Name of family member notified: Christy Wade (daughter) Patient and family notified of of transfer: 03/14/20  Discharge Plan and Services   Discharge Planning Services: CM Consult            DME Arranged: N/A DME Agency: NA       HH Arranged: NA [place] Agency: NA        Social Determinants of Health ([place]) Interventions     Readmission Risk Interventions No flowsheet data found.

## 2020-03-14 NOTE — Discharge Instructions (Signed)
Rib Fracture  A rib fracture is a break or crack in one of the bones of the ribs. The ribs are like a cage that goes around your upper chest. A broken or cracked rib is often painful, but most do not cause other problems. Most rib fractures usually heal on their own in 1-3 months. Follow these instructions at home: Managing pain, stiffness, and swelling  If directed, apply ice to the injured area. ? Put ice in a plastic bag. ? Place a towel between your skin and the bag. ? Leave the ice on for 20 minutes, 2-3 times a day.  Take over-the-counter and prescription medicines only as told by your doctor. Activity  Avoid activities that cause pain to the injured area. Protect your injured area.  Slowly increase activity as told by your doctor. General instructions  Do deep breathing as told by your doctor. You may be told to: ? Take deep breaths many times a day. ? Cough many times a day while hugging a pillow. ? Use a device (incentive spirometer) to do deep breathing many times a day.  Drink enough fluid to keep your pee (urine) clear or pale yellow.  Do not wear a rib belt or binder. These do not allow you to breathe deeply.  Keep all follow-up visits as told by your doctor. This is important. Contact a doctor if:  You have a fever. Get help right away if:  You have trouble breathing.  You are short of breath.  You cannot stop coughing.  You cough up thick or bloody spit (sputum).  You feel sick to your stomach (nauseous), throw up (vomit), or have belly (abdominal) pain.  Your pain gets worse and medicine does not help. Summary  A rib fracture is a break or crack in one of the bones of the ribs.  Apply ice to the injured area and take medicines for pain as told by your doctor.  Take deep breaths and cough many times a day. Hug a pillow every time you cough. This information is not intended to replace advice given to you by your health care provider. Make sure you  discuss any questions you have with your health care provider. Document Revised: 06/17/2017 Document Reviewed: 10/05/2016 Elsevier Patient Education  2020 [place] intoxication is a condition that can result when you drink water faster than your body can remove it. This can happen when you drink a lot of water very quickly over a short period of time. Water intoxication can cause fluid buildup that affects your brain and lungs. Excess water can also cause the amount of salt in your blood to become too low (hyponatremia). This condition can range from mild to severe. What are the causes? This condition is caused by drinking water faster than your body can remove it. What increases the risk? You are more likely to develop this condition if you:  Drink water often during an endurance event that lasts more than 4 hours.  Drink more than 17-34 oz (500-1,000 mL) of water or sports drinks per hour while exercising.  Have a health condition that makes it hard for your body to get rid of excess water, such as sickle cell anemia.  Have other health conditions that increase the risk for water intoxication, such as cystic fibrosis.  Have been losing fluids, sodium, and nutrients through heavy sweating, vomiting, or diarrhea.  Have a health condition that causes excessive thirst and cravings for water. What  are the signs or symptoms? Symptoms can appear up to 24 hours after drinking too much water. Moderate symptoms of this condition include:  Headache.  Nausea or vomiting.  Loss of appetite.  Cramps in the abdomen.  Gaining weight several hours after an endurance activity. Severe symptoms of this condition include:  Changed mental state, such as confusion or irritability.  Trouble breathing.  Irregular heartbeat.  Convulsions.  Fluttering eyelids.  Unconsciousness or coma. How is this diagnosed? This condition is diagnosed based on your medical  history, symptoms, and a physical exam. You may also have tests done, such as:  Blood tests.  Urine tests. How is this treated? This condition may be treated by:  Limiting how much water and other fluids you drink.  Taking oral sodium tablets.  Drinking beverages that have a high sodium content.  Receiving medicines through an IV to control your symptoms. Severe symptoms may need to be monitored and managed in a hospital setting. Follow these instructions at home:   Limit how much water or other fluids you drink as told by your health care provider.  Take over-the-counter and prescription medicines only as told by your health care provider. This includes any sodium tablets.  Keep all follow-up visits as told by your health care provider. This is important. How is this prevented?  Educate ultra-endurance athletes, such as runners, to drink mostly when thirsty and to avoid drinking large amounts of water in a short time.  Correct underlying conditions that cause increased thirst and excessive intake of water. Contact a health care provider if:  You continue to have symptoms of water intoxication after treatment. Get help right away if:  You have a seizure.  You faint.  You become confused or disoriented.  You have changes in your mood or behavior. Summary  Water intoxication is a condition that can result when you drink water faster than your body can remove it.  Symptoms can appear up to 24 hours after drinking too much water.  Educate ultra-endurance athletes, such as runners, to drink mostly when thirsty and to avoid drinking larger amounts of free water in a short time.  Contact a health care provider if you continue to have symptoms of water intoxication after treatment.  Keep all follow-up visits as told by your health care provider. This information is not intended to replace advice given to you by your health care provider. Make sure you discuss any questions  you have with your health care provider. Document Revised: 02/09/2018 Document Reviewed: 02/09/2018 Elsevier Patient Education  [place].   Hyponatremia Hyponatremia is when the amount of salt (sodium) in your blood is too low. When sodium levels are low, your cells absorb extra water, which causes them to swell. The swelling happens throughout the body, but it mostly affects the brain. What are the causes? This condition may be caused by:  Certain medical conditions, such as: ? Heart, kidney, or liver problems. ? Thyroid problems. ? Adrenal gland problems. ? Metabolic conditions, such as Addison disease or syndrome of inappropriate antidiuretic hormone (SIADH).  Severe vomiting or diarrhea.  Certain medicines or illegal drugs.  Dehydration.  Drinking too much water.  Eating a diet that is low in sodium.  Large burns on your body.  Excessive sweating. What increases the risk? You are more likely to develop this condition if you:  Have long-term (chronic) kidney disease.  Have heart failure.  Have a medical condition that causes frequent or excessive diarrhea.  Participate in intense physical activities, such as marathon running.  Take certain medicines that affect the sodium and fluid balance in the blood. Some of these medicine types include: ? Diuretics. ? NSAIDs. ? Some opioid pain medicines. ? Some antidepressants. ? Some seizure prevention medicines. What are the signs or symptoms? Symptoms of this condition include:  Headache.  Nausea and vomiting.  Being very tired (lethargic).  Muscle weakness and cramping.  Loss of appetite.  Feeling weak or light-headed. Severe symptoms of this condition include:  Confusion.  Agitation.  Having a rapid heart rate.  Passing out (fainting).  Seizures.  Coma. How is this diagnosed? This condition is diagnosed based on:  A physical exam.  Your medical history.  Tests, including: ? Blood  tests. ? Urine tests. How is this treated? Treatment for this condition depends on the cause. Treatment may include:  Getting fluids through an IV that is inserted into one of your veins.  Medicines to correct the sodium imbalance. If medicines are causing the condition, the medicines will need to be adjusted.  Limiting your water or fluid intake to get the correct sodium balance.  Monitoring in the hospital setting to closely watch your symptoms for improvement. Follow these instructions at home:   Take over-the-counter and prescription medicines only as told by your health care provider. Many medicines can make this condition worse. Talk with your health care provider about any medicines that you are currently taking.  Carefully follow a recommended diet as told by your health care provider.  Carefully follow instructions from your health care provider about fluid restrictions.  Do not drink alcohol.  Keep all follow-up visits as told by your health care provider. This is important. Contact a health care provider if:  You develop worsening nausea, fatigue, headache, confusion, or weakness.  Your symptoms go away and then return.  You have problems following the recommended diet. Get help right away if:  You have a seizure.  You pass out.  You have ongoing diarrhea or vomiting. Summary  Hyponatremia is when the amount of salt (sodium) in your blood is too low.  When sodium levels are low, your cells absorb extra water, which causes them to swell.  The swelling happens throughout the body, but it mostly affects the brain.  Treatment for this condition depends on the cause. It may include IV fluids, medicines, and limiting your fluid intake. This information is not intended to replace advice given to you by your health care provider. Make sure you discuss any questions you have with your health care provider. Document Revised: 05/19/2018 Document Reviewed:  05/19/2018 Elsevier Patient Education  2020 [employer].

## 2020-03-17 LAB — BASIC METABOLIC PANEL
BUN: 10 (ref 4–21)
CO2: 19 (ref 13–22)
Chloride: 96 — AB (ref 99–108)
Creatinine: 0.7 (ref 0.5–1.1)
Glucose: 90
Potassium: 4.6 (ref 3.4–5.3)
Sodium: 127 — AB (ref 137–147)

## 2020-03-17 LAB — COMPREHENSIVE METABOLIC PANEL: Calcium: 8.2 — AB (ref 8.7–10.7)

## 2020-03-18 ENCOUNTER — Encounter: Payer: Self-pay | Admitting: Internal Medicine

## 2020-03-18 ENCOUNTER — Non-Acute Institutional Stay (SKILLED_NURSING_FACILITY): Payer: Medicare PPO | Admitting: Internal Medicine

## 2020-03-18 DIAGNOSIS — S2241XA Multiple fractures of ribs, right side, initial encounter for closed fracture: Secondary | ICD-10-CM

## 2020-03-18 DIAGNOSIS — R197 Diarrhea, unspecified: Secondary | ICD-10-CM

## 2020-03-18 DIAGNOSIS — S43101A Unspecified dislocation of right acromioclavicular joint, initial encounter: Secondary | ICD-10-CM

## 2020-03-18 DIAGNOSIS — I10 Essential (primary) hypertension: Secondary | ICD-10-CM

## 2020-03-18 DIAGNOSIS — E039 Hypothyroidism, unspecified: Secondary | ICD-10-CM | POA: Diagnosis not present

## 2020-03-18 DIAGNOSIS — E871 Hypo-osmolality and hyponatremia: Secondary | ICD-10-CM | POA: Diagnosis not present

## 2020-03-18 DIAGNOSIS — B370 Candidal stomatitis: Secondary | ICD-10-CM

## 2020-03-18 DIAGNOSIS — Z789 Other specified health status: Secondary | ICD-10-CM

## 2020-03-18 NOTE — Progress Notes (Signed)
Provider:  Rexene Edison. Mariea Clonts, D.O., C.M.D. Location:  [place] Room Number: 756 p Place of Service:  SNF (31)  PCP: Ronnald Nian, DO Patient Care Team: Ronnald Nian, DO as PCP - General (Family Medicine) Michael Boston, MD as Consulting Physician (General Surgery) Milus Banister, MD as Attending Physician (Gastroenterology)  Extended Emergency Contact Information Primary Emergency Contact: Irving Burton [place] of [place] Phone: [phone] Relation: Sister Secondary Emergency Contact: [facility] Mobile Phone: [phone] Relation: Daughter  Code Status: FULL code Goals of Care: Advanced Directive information Advanced Directives 03/18/2020  Does Patient Have a Medical Advance Directive? Yes  Type of Advance Directive -  Does patient want to make changes to medical advance directive? No - Patient declined  Copy of [place] in Chart? -   Chief Complaint  Patient presents with  . New Admit To SNF    New Admit    HPI: Patient is a 84 y.o. 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  She had n/v/d for 4 days and had gone to the urgent care the day before admission where she was given zofran and sent home.  When she got up early the am of admission, she fell on her right side possibly against a table.  She did not hit her head of lose consciousness.  She also struck her right shoulder and right chest and was having pain in them.    She was newly on lisinopril/hctz from 5 days prior to admission.   In the ED, her BP was elevated, Na 111 down from 133 on labs 3 mos prior, cr 1.26 up  from 0.1-1.1 at baseline.  WBC 16.6.  She had R7-9th rib fxs on CXR with displacement of the 9th.  She was given fentanyl, IV morphine, zofran, phenergan, and NS bolus prior to hospitalist admission.    Shoulder xrays then showed AC joint separation (was deemed inoperable), right rib fxs of the 7th and 8th with some chest wall air, but no pneumothorax  She had her pfizer covid vaccines in [place] and feb.    She was gradually rehydrated with frequent sodium rechecks. She initially had her sodium drop to 109 and actually required ICU mgt.  She was put on incentive spirometry, pain mgt and evaluated by PT. She did not have further diarrhea and abdominal exam was benign during stay.  She was continued on antiemetics.    For her bp, she was started on amlodipine 10mg , hydralazine 25mg  po tid and 8/26 coreg was added.  She remains off lisinopril and hctz.  AT discharge, goal was to get sodium level up to 130-135 by maintaining fluid restriction of 1000cc/day and use of sodium tablets.  She also was sent on a regular diet with ability to add extra salt.  Na was 128 at hospital d/c.    She came to [place] in a sling and swath for her right arm and to f/u with Dr. Griffin Basil from ortho.  Pain mgt has been with tylenol for mild, tramadol for moderate and oxycodone for severe pain.  She was resistant to using the opiates and chart has indicated codeine allergy.  IS  is to be continued q2h for rib fxs.  She was noted to have bilateral essential tremor and outpatient workup recommended.  If severe enough, may need nonselective beta blocker outpatient.  Lab Results  Component Value Date   TSH 8.611 (H) 03/05/2020  was felt to be due to her illness so recheck in 3 wks recommended.  Free T4 was 1.3  She had urinary retention 8/24 and a foley was placed 8/25. Voiding trial has been recommended in one week and and if unsuccessful, she'll need outpatient urology f/u.    She was, of course, incidentally found to have a  klebsiella UTI which was treated with keflex thru 8/26.    BMP drawn yesterday here shows Na down one point to 127.  Dietitian had indicated that fluid restriction had to 1200 not 1000cc here.  She is on the 2g sodium bid.    When seen, she expressed frustration about how she went from independent to her current status of weakness and needing assistance.  She reports not really feeling too bad.  Has pain in her right shoulder and right ribs when she moves or takes big breaths.  She was very concerned about her sodium levels and we reviewed that she is down to 127.  She is having soreness of her mouth and tongue with an ulcer on her tongue.    Past Medical History:  Diagnosis Date  . Anemia    on iron now  . Cataract   . Hypertension   . Hypothyroidism   . Osteoporosis   . PONV (postoperative nausea and vomiting)   . Thyroid disease   . Varicose vein    Past Surgical History:  Procedure Laterality Date  . ABDOMINAL HYSTERECTOMY  1975   partial  . APPENDECTOMY  1975   with hysterectomy  . HEMORROIDECTOMY     [phone]  . LAPAROSCOPIC SMALL BOWEL RESECTION N/A 11/29/2014   Procedure: LAPAROSCOPIC SMALL INTESTINE RESECTION;  Surgeon: Michael Boston, MD;  Location: [facility];  Service: General;  Laterality: N/A;  . VARICOSE VEIN SURGERY Left 1971    Social History   Socioeconomic History  . Marital status: Widowed    Spouse name: Not on file  . Number of children: Not on file  . Years of education: Not on file  . Highest education level: Not on file  Occupational History  . Occupation: Retired  Tobacco Use  . Smoking status: Never Smoker  . Smokeless tobacco: Never Used  Vaping Use  . Vaping Use: Never used  Substance and Sexual Activity  . Alcohol use: No    Alcohol/week: 0.0 standard drinks  . Drug use: No  . Sexual activity: Not Currently  Other Topics Concern  . Not on file  Social History Narrative  . Not on file   Social Determinants of Health   Financial  Resource Strain: Low Risk   . Difficulty of Paying Living Expenses: Not hard at all  Food Insecurity: No Food Insecurity  . Worried About Charity fundraiser in the Last Year: Never true  . Ran Out of Food in the Last Year: Never true  Transportation Needs: No Transportation Needs  . Lack of Transportation (Medical): No  . Lack of Transportation (Non-Medical): No  Physical Activity: Inactive  . Days of Exercise per Week: 0 days  . Minutes of Exercise per Session: 0 min  Stress: No Stress Concern Present  . Feeling of Stress : Not at all  Social Connections: Moderately Integrated  .  Frequency of Communication with Friends and Family: More than three times a week  . Frequency of Social Gatherings with Friends and Family: More than three times a week  . Attends Religious Services: More than 4 times per year  . Active Member of Clubs or Organizations: Yes  . Attends Archivist Meetings: More than 4 times per year  . Marital Status: Widowed    reports that she has never smoked. She has never used smokeless tobacco. She reports that she does not drink alcohol and does not use drugs.  Functional Status Survey:    Family History  Problem Relation Age of Onset  . Bone cancer Mother   . Diabetes Father   . Stroke Father   . Breast cancer Sister   . Stomach cancer Neg Hx   . Colon cancer Neg Hx     Health Maintenance  Topic Date Due  . INFLUENZA VACCINE  02/17/2020  . TETANUS/TDAP  05/30/2028  . DEXA SCAN  Completed  . COVID-19 Vaccine  Completed  . PNA vac Low Risk Adult  Completed    Allergies  Allergen Reactions  . Codeine Nausea Only    REACTION: nausea  . Sulfamethoxazole Rash    REACTION: rash    Outpatient Encounter Medications as of 03/18/2020  Medication Sig  . acetaminophen (TYLENOL) 500 MG tablet Take 2 tablets (1,000 mg total) by mouth every 8 (eight) hours.  Marland Kitchen amLODipine (NORVASC) 10 MG tablet Take 1 tablet (10 mg total) by mouth daily.  .  cycloSPORINE (RESTASIS) 0.05 % ophthalmic emulsion Place 1 drop into both eyes 2 (two) times daily.   . hydrALAZINE (APRESOLINE) 25 MG tablet Take 1 tablet (25 mg total) by mouth every 8 (eight) hours.  . hydrOXYzine (ATARAX/VISTARIL) 10 MG tablet Take 1 tablet (10 mg total) by mouth 3 (three) times daily as needed for anxiety.  Marland Kitchen levothyroxine (SYNTHROID) 50 MCG tablet TAKE 1 TABLET BY MOUTH EVERY MORNING  . ondansetron (ZOFRAN-ODT) 4 MG disintegrating tablet Take 4 mg by mouth daily as needed for nausea.  Marland Kitchen oxyCODONE (OXY IR/ROXICODONE) 5 MG immediate release tablet Take 1 tablet (5 mg total) by mouth every 4 (four) hours as needed for moderate pain.  . potassium chloride SA (KLOR-CON) 20 MEQ tablet Take 2 tablets (40 mEq total) by mouth 2 (two) times daily.  . sodium chloride 1 g tablet Take 2 tablets (2 g total) by mouth 2 (two) times daily with a meal.  . traMADol (ULTRAM) 50 MG tablet Take 1 tablet (50 mg total) by mouth every 8 (eight) hours as needed for moderate pain.   No facility-administered encounter medications on file as of 03/18/2020.    Review of Systems  Constitutional: Positive for malaise/fatigue. Negative for chills and fever.  HENT: Negative for congestion and sore throat.   Eyes: Negative for blurred vision.  Respiratory: Negative for cough and shortness of breath.   Cardiovascular: Negative for chest pain, palpitations and leg swelling.  Gastrointestinal: Negative for abdominal pain, blood in stool, constipation, diarrhea, melena, nausea and vomiting.       Has some loose stool at baseline  Genitourinary: Negative for dysuria.       Foley in place with medium yellow urine  Musculoskeletal: Positive for falls, joint pain and myalgias.  Skin: Negative for itching and rash.  Neurological: Positive for weakness. Negative for dizziness and loss of consciousness.       Reports being told she had a seizure but that's not in  d/c summary  Psychiatric/Behavioral: Negative for  depression and memory loss. The patient is not nervous/anxious and does not have insomnia.     Vitals:   03/18/20 0904  BP: (!) 147/67  Pulse: 83  Temp: (!) 97.2 F (36.2 C)  Weight: 158 lb 1.1 oz (71.7 kg)  Height: 5\' 7"  (1.702 m)   Body mass index is 24.76 kg/m. Physical Exam Vitals reviewed.  Constitutional:      General: She is not in acute distress.    Appearance: Normal appearance. She is not toxic-appearing.  HENT:     Head: Normocephalic and atraumatic.     Right Ear: External ear normal.     Left Ear: External ear normal.     Nose: Nose normal.     Mouth/Throat:     Mouth: Mucous membranes are dry.     Pharynx: Oropharyngeal exudate and posterior oropharyngeal erythema present.     Comments: Some whiteness of tongue with ulceration on left underside Eyes:     Extraocular Movements: Extraocular movements intact.     Conjunctiva/sclera: Conjunctivae normal.     Pupils: Pupils are equal, round, and reactive to light.  Cardiovascular:     Rate and Rhythm: Normal rate and regular rhythm.     Pulses: Normal pulses.     Heart sounds: Normal heart sounds.  Pulmonary:     Effort: Pulmonary effort is normal.     Breath sounds: Normal breath sounds.  Abdominal:     General: Bowel sounds are normal.     Palpations: Abdomen is soft.     Tenderness: There is no abdominal tenderness. There is no guarding or rebound.  Musculoskeletal:     Cervical back: Neck supple.     Right lower leg: Edema present.     Left lower leg: Edema present.     Comments: Right arm in sling due to shoulder separation that could not be repaired  Skin:    General: Skin is warm and dry.     Capillary Refill: Capillary refill takes less than 2 seconds.     Comments: Ecchymoses of right chest/shoulder  Neurological:     General: No focal deficit present.     Mental Status: She is alert and oriented to person, place, and time.     Cranial Nerves: No cranial nerve deficit.     Sensory: No sensory  deficit.     Motor: Weakness present.     Coordination: Coordination normal.     Gait: Gait normal.     Deep Tendon Reflexes: Reflexes normal.  Psychiatric:        Mood and Affect: Mood normal.        Behavior: Behavior normal.        Thought Content: Thought content normal.        Judgment: Judgment normal.     Labs reviewed: Basic Metabolic Panel: Recent Labs    04/16/19 0912 04/16/19 0912 11/14/19 1547 03/05/20 0913 03/07/20 0719 03/07/20 1407 03/10/20 1143 03/10/20 1801 03/12/20 0519 03/12/20 0940 03/12/20 2201 03/13/20 1832 03/14/20 0748  NA 133*   < > 133*   < > 121*  120*   < > 127*   < > 127*   < > 129* 128* 128*  K 4.0   < > 4.5   < > 3.8   < >  --    < > 3.5  --   --  4.6 4.7  CL 102   < > 101   < >  92*   < >  --    < > 97*  --   --  101 99  CO2 21   < > 24   < > 18*   < >  --    < > 18*  --   --  18* 18*  GLUCOSE 94   < > 97   < > 107*   < >  --    < > 92  --   --  107* 90  BUN 11   < > 17   < > 11   < >  --    < > 17  --   --  18 18  CREATININE 1.01*  --  1.12   < > 0.90   < >  --    < > 1.01*  --   --  0.91 0.86  CALCIUM 8.9   < > 9.1   < > 7.9*   < >  --    < > 8.2*  --   --  8.1* 8.2*  MG  --   --  2.0  --   --   --  1.8  --   --   --   --   --   --   PHOS 3.7  --   --   --  2.2*  --   --   --   --   --   --   --   --    < > = values in this interval not displayed.   Liver Function Tests: Recent Labs    03/05/20 0913 03/07/20 0719  AST 74*  --   ALT 35  --   ALKPHOS 64  --   BILITOT 1.3*  --   PROT 8.3*  --   ALBUMIN 4.3 3.2*   Recent Labs    03/05/20 0913  LIPASE 31   No results for input(s): AMMONIA in the last 8760 hours. CBC: Recent Labs    03/08/20 0520 03/09/20 0628 03/10/20 0738  WBC 13.0* 10.5 7.7  NEUTROABS 10.8* 7.5 4.7  HGB 11.5* 10.5* 10.4*  HCT 33.2* 29.8* 29.3*  MCV 87.6 87.1 87.5  PLT 146* 188 214   Cardiac Enzymes: No results for input(s): CKTOTAL, CKMB, CKMBINDEX, TROPONINI in the last 8760  hours. BNP: Invalid input(s): POCBNP No results found for: HGBA1C Lab Results  Component Value Date   TSH 8.611 (H) 03/05/2020   Lab Results  Component Value Date   [id number] 009 04/16/2019   Lab Results  Component Value Date   FOLATE 14.3 01/22/2019   Lab Results  Component Value Date   FERRITIN 59.4 04/16/2019    Imaging and Procedures obtained prior to SNF admission: DG Shoulder Right  Result Date: 03/05/2020 CLINICAL DATA:  Fall with shoulder pain EXAM: RIGHT SHOULDER - 2+ VIEW COMPARISON:  None. FINDINGS: Posterior right seventh and eighth rib fractures. No visible pneumothorax but there is soft tissue gas on the lateral view and marked displacement of 1 of the fractures. Corticated defect in the acromion. Offset at the acromioclavicular joint by at least 50%. No fracture deformity. No coracoclavicular interval widening IMPRESSION: 1. AC joint offset suggesting separated shoulder. 2. Right rib fractures involving the seventh and eighth ribs at least. One of these fractures is markedly displaced and there is adjacent chest wall emphysema. No visible pneumothorax. 3. Os acromiale. Electronically Signed   By: Monte Fantasia M.D.   On: 03/05/2020 09:48  DG Chest Portable 1 View  Result Date: 03/05/2020 CLINICAL DATA:  Fall with rib fractures on shoulder radiograph EXAM: PORTABLE CHEST 1 VIEW COMPARISON:  11/10/2005 FINDINGS: Right seventh, eighth, and ninth rib fractures with prominent displacement. Streaky opacity at the right base. No visible effusion or pneumothorax. Mild cardiomegaly. Aortic tortuosity. Shoulder findings as on dedicated study IMPRESSION: 1. Right seventh through ninth rib fractures with prominent displacement. 2. Atelectasis at the right base.  No visible pneumothorax. Electronically Signed   By: Monte Fantasia M.D.   On: 03/05/2020 11:54    Assessment/Plan 1. Hyponatremia -Na down to 127 on recheck, was to be repeated again 9/1, if remaining 127 or trending  down, will need to increase sodium tablets to tid b/c cannot restrict fluids more with her already terribly dry mouth  2. Closed fracture of multiple ribs of right side, initial encounter -remains painful, continue IS and PT, OT  3. AC separation, right, initial encounter -continue sling, deemed that it could not be repaired at hospital, likely will need outpatient f/u on this  4. 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 Endocrine tumor  5. Essential hypertension -bp at upper limit of normal (with auto cuff), cont same regimen and monitor, if elevated, will need manual recheck  6. Hypothyroidism, unspecified type -cont current levothyroxine and recheck as above Lab Results  Component Value Date   TSH 8.611 (H) 03/05/2020   7. Oral thrush -will add nystatin swish and spit until ulceration resolves -may also be having dry mouth and need to use biotene due to the sodium tabs and fluid restriction  8. Full code status - Full code  Family/ staff Communication: discussed with snf nurse  Labs/tests ordered:  BMP mon, wed, fri  untl over 130, then weekly  Tyton Abdallah L. Dalary Hollar, D.O. [facility] [address], [place] [address] Cell Phone (Mon-Fri 8am-5pm):  [phone] On Call:  [phone] & follow prompts after 5pm & weekends Office Phone:  [phone] Office Fax:  [phone]

## 2020-03-25 ENCOUNTER — Emergency Department (HOSPITAL_COMMUNITY): Payer: Medicare PPO

## 2020-03-25 ENCOUNTER — Encounter: Payer: Self-pay | Admitting: Family Medicine

## 2020-03-25 ENCOUNTER — Other Ambulatory Visit: Payer: Self-pay

## 2020-03-25 ENCOUNTER — Inpatient Hospital Stay (HOSPITAL_COMMUNITY): Payer: Medicare PPO

## 2020-03-25 ENCOUNTER — Inpatient Hospital Stay (HOSPITAL_COMMUNITY)
Admission: EM | Admit: 2020-03-25 | Discharge: 2020-03-28 | DRG: 189 | Disposition: A | Discharge status: Skilled Nursing Facility | Payer: Medicare PPO | Source: Skilled Nursing Facility | Attending: Internal Medicine | Admitting: Internal Medicine

## 2020-03-25 ENCOUNTER — Encounter (HOSPITAL_COMMUNITY): Payer: Self-pay | Admitting: Emergency Medicine

## 2020-03-25 DIAGNOSIS — E039 Hypothyroidism, unspecified: Secondary | ICD-10-CM

## 2020-03-25 DIAGNOSIS — R0602 Shortness of breath: Secondary | ICD-10-CM | POA: Diagnosis not present

## 2020-03-25 DIAGNOSIS — J9601 Acute respiratory failure with hypoxia: Secondary | ICD-10-CM | POA: Diagnosis present

## 2020-03-25 DIAGNOSIS — Z66 Do not resuscitate: Secondary | ICD-10-CM | POA: Diagnosis not present

## 2020-03-25 DIAGNOSIS — Z20822 Contact with and (suspected) exposure to covid-19: Secondary | ICD-10-CM | POA: Diagnosis not present

## 2020-03-25 DIAGNOSIS — Z7401 Bed confinement status: Secondary | ICD-10-CM | POA: Diagnosis not present

## 2020-03-25 DIAGNOSIS — E8770 Fluid overload, unspecified: Secondary | ICD-10-CM | POA: Diagnosis not present

## 2020-03-25 DIAGNOSIS — I10 Essential (primary) hypertension: Secondary | ICD-10-CM | POA: Diagnosis not present

## 2020-03-25 DIAGNOSIS — Z79890 Hormone replacement therapy (postmenopausal): Secondary | ICD-10-CM | POA: Diagnosis not present

## 2020-03-25 DIAGNOSIS — S43004D Unspecified dislocation of right shoulder joint, subsequent encounter: Secondary | ICD-10-CM

## 2020-03-25 DIAGNOSIS — R0902 Hypoxemia: Secondary | ICD-10-CM | POA: Diagnosis not present

## 2020-03-25 DIAGNOSIS — J90 Pleural effusion, not elsewhere classified: Secondary | ICD-10-CM | POA: Diagnosis not present

## 2020-03-25 DIAGNOSIS — M810 Age-related osteoporosis without current pathological fracture: Secondary | ICD-10-CM | POA: Diagnosis present

## 2020-03-25 DIAGNOSIS — I340 Nonrheumatic mitral (valve) insufficiency: Secondary | ICD-10-CM

## 2020-03-25 DIAGNOSIS — J810 Acute pulmonary edema: Secondary | ICD-10-CM | POA: Diagnosis not present

## 2020-03-25 DIAGNOSIS — Z803 Family history of malignant neoplasm of breast: Secondary | ICD-10-CM | POA: Diagnosis not present

## 2020-03-25 DIAGNOSIS — S2231XA Fracture of one rib, right side, initial encounter for closed fracture: Secondary | ICD-10-CM | POA: Diagnosis not present

## 2020-03-25 DIAGNOSIS — Z823 Family history of stroke: Secondary | ICD-10-CM

## 2020-03-25 DIAGNOSIS — S2249XA Multiple fractures of ribs, unspecified side, initial encounter for closed fracture: Secondary | ICD-10-CM | POA: Diagnosis not present

## 2020-03-25 DIAGNOSIS — S2241XD Multiple fractures of ribs, right side, subsequent encounter for fracture with routine healing: Secondary | ICD-10-CM

## 2020-03-25 DIAGNOSIS — Z833 Family history of diabetes mellitus: Secondary | ICD-10-CM | POA: Diagnosis not present

## 2020-03-25 DIAGNOSIS — I509 Heart failure, unspecified: Secondary | ICD-10-CM

## 2020-03-25 DIAGNOSIS — I351 Nonrheumatic aortic (valve) insufficiency: Secondary | ICD-10-CM

## 2020-03-25 DIAGNOSIS — S2249XS Multiple fractures of ribs, unspecified side, sequela: Secondary | ICD-10-CM

## 2020-03-25 DIAGNOSIS — R2681 Unsteadiness on feet: Secondary | ICD-10-CM | POA: Diagnosis not present

## 2020-03-25 DIAGNOSIS — Z79899 Other long term (current) drug therapy: Secondary | ICD-10-CM

## 2020-03-25 DIAGNOSIS — J9690 Respiratory failure, unspecified, unspecified whether with hypoxia or hypercapnia: Secondary | ICD-10-CM | POA: Diagnosis present

## 2020-03-25 DIAGNOSIS — R2689 Other abnormalities of gait and mobility: Secondary | ICD-10-CM | POA: Diagnosis not present

## 2020-03-25 DIAGNOSIS — R7401 Elevation of levels of liver transaminase levels: Secondary | ICD-10-CM | POA: Diagnosis present

## 2020-03-25 DIAGNOSIS — M6281 Muscle weakness (generalized): Secondary | ICD-10-CM | POA: Diagnosis not present

## 2020-03-25 DIAGNOSIS — J9811 Atelectasis: Secondary | ICD-10-CM | POA: Diagnosis present

## 2020-03-25 DIAGNOSIS — M2550 Pain in unspecified joint: Secondary | ICD-10-CM | POA: Diagnosis not present

## 2020-03-25 DIAGNOSIS — E876 Hypokalemia: Secondary | ICD-10-CM | POA: Diagnosis not present

## 2020-03-25 DIAGNOSIS — R1312 Dysphagia, oropharyngeal phase: Secondary | ICD-10-CM | POA: Diagnosis not present

## 2020-03-25 DIAGNOSIS — I5033 Acute on chronic diastolic (congestive) heart failure: Secondary | ICD-10-CM | POA: Diagnosis not present

## 2020-03-25 DIAGNOSIS — E871 Hypo-osmolality and hyponatremia: Secondary | ICD-10-CM | POA: Diagnosis present

## 2020-03-25 DIAGNOSIS — Z9181 History of falling: Secondary | ICD-10-CM | POA: Diagnosis not present

## 2020-03-25 DIAGNOSIS — R0789 Other chest pain: Secondary | ICD-10-CM | POA: Diagnosis not present

## 2020-03-25 DIAGNOSIS — I361 Nonrheumatic tricuspid (valve) insufficiency: Secondary | ICD-10-CM | POA: Diagnosis not present

## 2020-03-25 DIAGNOSIS — R41841 Cognitive communication deficit: Secondary | ICD-10-CM | POA: Diagnosis not present

## 2020-03-25 DIAGNOSIS — J439 Emphysema, unspecified: Secondary | ICD-10-CM | POA: Diagnosis not present

## 2020-03-25 DIAGNOSIS — W19XXXD Unspecified fall, subsequent encounter: Secondary | ICD-10-CM | POA: Diagnosis present

## 2020-03-25 DIAGNOSIS — J9600 Acute respiratory failure, unspecified whether with hypoxia or hypercapnia: Secondary | ICD-10-CM | POA: Diagnosis not present

## 2020-03-25 LAB — ECHOCARDIOGRAM COMPLETE
Height: 67 in
S' Lateral: 3.5 cm
Weight: 2560 oz

## 2020-03-25 LAB — CBC
HCT: 30.5 % — ABNORMAL LOW (ref 36.0–46.0)
Hemoglobin: 10.4 g/dL — ABNORMAL LOW (ref 12.0–15.0)
MCH: 31.5 pg (ref 26.0–34.0)
MCHC: 34.1 g/dL (ref 30.0–36.0)
MCV: 92.4 fL (ref 80.0–100.0)
Platelets: 309 10*3/uL (ref 150–400)
RBC: 3.3 MIL/uL — ABNORMAL LOW (ref 3.87–5.11)
RDW: 16.8 % — ABNORMAL HIGH (ref 11.5–15.5)
WBC: 6 10*3/uL (ref 4.0–10.5)
nRBC: 0 % (ref 0.0–0.2)

## 2020-03-25 LAB — CBC WITH DIFFERENTIAL/PLATELET
Abs Immature Granulocytes: 0.04 10*3/uL (ref 0.00–0.07)
Basophils Absolute: 0 10*3/uL (ref 0.0–0.1)
Basophils Relative: 0 %
Eosinophils Absolute: 0 10*3/uL (ref 0.0–0.5)
Eosinophils Relative: 0 %
HCT: 31.4 % — ABNORMAL LOW (ref 36.0–46.0)
Hemoglobin: 10.4 g/dL — ABNORMAL LOW (ref 12.0–15.0)
Immature Granulocytes: 1 %
Lymphocytes Relative: 12 %
Lymphs Abs: 0.7 10*3/uL (ref 0.7–4.0)
MCH: 30.7 pg (ref 26.0–34.0)
MCHC: 33.1 g/dL (ref 30.0–36.0)
MCV: 92.6 fL (ref 80.0–100.0)
Monocytes Absolute: 0.7 10*3/uL (ref 0.1–1.0)
Monocytes Relative: 11 %
Neutro Abs: 4.5 10*3/uL (ref 1.7–7.7)
Neutrophils Relative %: 76 %
Platelets: 326 10*3/uL (ref 150–400)
RBC: 3.39 MIL/uL — ABNORMAL LOW (ref 3.87–5.11)
RDW: 16.6 % — ABNORMAL HIGH (ref 11.5–15.5)
WBC: 6 10*3/uL (ref 4.0–10.5)
nRBC: 0 % (ref 0.0–0.2)

## 2020-03-25 LAB — CREATININE, SERUM
Creatinine, Ser: 0.75 mg/dL (ref 0.44–1.00)
GFR calc Af Amer: >60 mL/min (ref >60)
GFR calc non Af Amer: >60 mL/min (ref >60)

## 2020-03-25 LAB — I-STAT CHEM 8, ED
BUN: 9 mg/dL (ref 8–23)
Calcium, Ion: 1.12 mmol/L — ABNORMAL LOW (ref 1.15–1.40)
Chloride: 102 mmol/L (ref 98–111)
Creatinine, Ser: 0.7 mg/dL (ref 0.44–1.00)
Glucose, Bld: 108 mg/dL — ABNORMAL HIGH (ref 70–99)
HCT: 32 % — ABNORMAL LOW (ref 36.0–46.0)
Hemoglobin: 10.9 g/dL — ABNORMAL LOW (ref 12.0–15.0)
Potassium: 4.8 mmol/L (ref 3.5–5.1)
Sodium: 131 mmol/L — ABNORMAL LOW (ref 135–145)
TCO2: 20 mmol/L — ABNORMAL LOW (ref 22–32)

## 2020-03-25 LAB — SARS CORONAVIRUS 2 BY RT PCR (HOSPITAL ORDER, PERFORMED IN ~~LOC~~ HOSPITAL LAB): SARS Coronavirus 2: NEGATIVE

## 2020-03-25 LAB — BRAIN NATRIURETIC PEPTIDE: B Natriuretic Peptide: 308.2 pg/mL — ABNORMAL HIGH (ref 0.0–100.0)

## 2020-03-25 LAB — TROPONIN I (HIGH SENSITIVITY)
Troponin I (High Sensitivity): 16 ng/L (ref <18)
Troponin I (High Sensitivity): 15 ng/L (ref <18)

## 2020-03-25 MED ORDER — SODIUM CHLORIDE 1 G PO TABS
2.0000 g | ORAL_TABLET | Freq: Two times a day (BID) | ORAL | Status: DC
  Administered 2020-03-25 – 2020-03-26 (×2): 2 g via ORAL
  Filled 2020-03-25 (×4): qty 2

## 2020-03-25 MED ORDER — IOHEXOL 350 MG/ML SOLN
100.0000 mL | Freq: Once | INTRAVENOUS | Status: AC | PRN
  Administered 2020-03-25: 100 mL via INTRAVENOUS

## 2020-03-25 MED ORDER — HYDRALAZINE HCL 25 MG PO TABS
25.0000 mg | ORAL_TABLET | Freq: Three times a day (TID) | ORAL | Status: DC
  Administered 2020-03-25 – 2020-03-28 (×9): 25 mg via ORAL
  Filled 2020-03-25 (×9): qty 1

## 2020-03-25 MED ORDER — SODIUM CHLORIDE 0.9% FLUSH
3.0000 mL | Freq: Two times a day (BID) | INTRAVENOUS | Status: DC
  Administered 2020-03-25 – 2020-03-28 (×7): 3 mL via INTRAVENOUS

## 2020-03-25 MED ORDER — VANCOMYCIN HCL IN DEXTROSE 1-5 GM/200ML-% IV SOLN
1000.0000 mg | Freq: Once | INTRAVENOUS | Status: DC
  Filled 2020-03-25: qty 200

## 2020-03-25 MED ORDER — METOPROLOL TARTRATE 5 MG/5ML IV SOLN: 5.0000 mg | Freq: Four times a day (QID) | INTRAVENOUS | Status: DC | PRN

## 2020-03-25 MED ORDER — LIP MEDEX EX OINT
1.0000 "application " | TOPICAL_OINTMENT | CUTANEOUS | Status: DC | PRN
  Filled 2020-03-25 (×2): qty 7

## 2020-03-25 MED ORDER — POTASSIUM CHLORIDE CRYS ER 20 MEQ PO TBCR
40.0000 meq | EXTENDED_RELEASE_TABLET | Freq: Two times a day (BID) | ORAL | Status: DC
  Administered 2020-03-25 – 2020-03-26 (×2): 40 meq via ORAL
  Filled 2020-03-25 (×2): qty 2

## 2020-03-25 MED ORDER — ONDANSETRON 4 MG PO TBDP
4.0000 mg | ORAL_TABLET | Freq: Every day | ORAL | Status: DC | PRN
  Administered 2020-03-27: 4 mg via ORAL
  Filled 2020-03-25: qty 1

## 2020-03-25 MED ORDER — ENOXAPARIN SODIUM 40 MG/0.4ML ~~LOC~~ SOLN
40.0000 mg | SUBCUTANEOUS | Status: DC
  Administered 2020-03-25 – 2020-03-28 (×4): 40 mg via SUBCUTANEOUS
  Filled 2020-03-25 (×4): qty 0.4

## 2020-03-25 MED ORDER — TRAMADOL HCL 50 MG PO TABS
50.0000 mg | ORAL_TABLET | Freq: Three times a day (TID) | ORAL | Status: DC | PRN
  Administered 2020-03-25: 50 mg via ORAL
  Filled 2020-03-25: qty 1

## 2020-03-25 MED ORDER — POLYETHYLENE GLYCOL 3350 17 G PO PACK: 17.0000 g | PACK | Freq: Every day | ORAL | Status: DC | PRN

## 2020-03-25 MED ORDER — LEVOTHYROXINE SODIUM 50 MCG PO TABS
50.0000 ug | ORAL_TABLET | Freq: Every day | ORAL | Status: DC
  Administered 2020-03-25 – 2020-03-28 (×4): 50 ug via ORAL
  Filled 2020-03-25 (×5): qty 1

## 2020-03-25 MED ORDER — OXYCODONE HCL 5 MG PO TABS
5.0000 mg | ORAL_TABLET | ORAL | Status: DC | PRN
  Administered 2020-03-25: 5 mg via ORAL
  Filled 2020-03-25 (×2): qty 1

## 2020-03-25 MED ORDER — CYCLOSPORINE 0.05 % OP EMUL
1.0000 [drp] | Freq: Two times a day (BID) | OPHTHALMIC | Status: DC
  Administered 2020-03-25 – 2020-03-28 (×6): 1 [drp] via OPHTHALMIC
  Filled 2020-03-25 (×8): qty 1

## 2020-03-25 MED ORDER — SODIUM CHLORIDE 0.9 % IV SOLN
2.0000 g | INTRAVENOUS | Status: AC
  Administered 2020-03-25: 2 g via INTRAVENOUS
  Filled 2020-03-25: qty 2

## 2020-03-25 MED ORDER — FUROSEMIDE 10 MG/ML IJ SOLN
40.0000 mg | Freq: Once | INTRAMUSCULAR | Status: AC
  Administered 2020-03-25: 40 mg via INTRAVENOUS
  Filled 2020-03-25: qty 4

## 2020-03-25 MED ORDER — HYDROXYZINE HCL 10 MG PO TABS
10.0000 mg | ORAL_TABLET | Freq: Three times a day (TID) | ORAL | Status: DC | PRN
  Administered 2020-03-25: 10 mg via ORAL
  Filled 2020-03-25: qty 1

## 2020-03-25 MED ORDER — ACETAMINOPHEN 500 MG PO TABS
1000.0000 mg | ORAL_TABLET | Freq: Three times a day (TID) | ORAL | Status: DC
  Administered 2020-03-25 – 2020-03-27 (×8): 1000 mg via ORAL
  Filled 2020-03-25 (×8): qty 2

## 2020-03-25 MED ORDER — POLYVINYL ALCOHOL 1.4 % OP SOLN
1.0000 [drp] | OPHTHALMIC | Status: DC | PRN
  Filled 2020-03-25: qty 15

## 2020-03-25 MED ORDER — AMLODIPINE BESYLATE 10 MG PO TABS
10.0000 mg | ORAL_TABLET | Freq: Every day | ORAL | Status: DC
  Administered 2020-03-25 – 2020-03-28 (×4): 10 mg via ORAL
  Filled 2020-03-25: qty 1
  Filled 2020-03-25: qty 2
  Filled 2020-03-25 (×2): qty 1

## 2020-03-25 NOTE — Progress Notes (Signed)
  Echocardiogram 2D Echocardiogram has been performed.  Christy Wade 03/25/2020, 4:06 PM

## 2020-03-25 NOTE — ED Provider Notes (Signed)
[facility] DEPT Provider Note   CSN: [id number] Arrival date & time: 03/25/20  3716     History No chief complaint on file.   Christy Wade is a 84 y.o. female.  The history is provided by the patient and the EMS personnel.  Shortness of Breath Severity:  Severe Onset quality:  Gradual Timing:  Constant Progression:  Worsening Chronicity:  New Context: not activity   Context comment:  Known rib fractures  Relieved by:  Nothing Worsened by:  Nothing Ineffective treatments:  None tried Associated symptoms: no abdominal pain, no chest pain, no cough, no diaphoresis, no fever, no rash and no vomiting   Risk factors: no recent alcohol use        Past Medical History:  Diagnosis Date  . Anemia    on iron now  . Cataract   . Hypertension   . Hypothyroidism   . Osteoporosis   . PONV (postoperative nausea and vomiting)   . Thyroid disease   . Varicose vein     Patient Active Problem List   Diagnosis Date Noted  . Multiple fractures of ribs, right side, subsequent encounter for fracture with routine healing 03/06/2020  . Hyponatremia 03/05/2020  . Diarrhea 07/24/2018  . Vaginitis and vulvovaginitis 05/22/2016  . Well adult exam 10/01/2015  . Chronic chest wall pain 09/09/2014  . Mitral valve regurgitation 06/21/2012  . Essential hypertension 01/03/2008  . Osteopenia 03/28/2007  . Hypothyroidism 02/16/2007    Past Surgical History:  Procedure Laterality Date  . ABDOMINAL HYSTERECTOMY  1975   partial  . APPENDECTOMY  1975   with hysterectomy  . HEMORROIDECTOMY     [phone]  . LAPAROSCOPIC SMALL BOWEL RESECTION N/A 11/29/2014   Procedure: LAPAROSCOPIC SMALL INTESTINE RESECTION;  Surgeon: Michael Boston, MD;  Location: [facility] ORS;  Service: General;  Laterality: N/A;  . VARICOSE VEIN SURGERY Left 1971     OB History   No obstetric history on file.     Family History  Problem Relation Age of Onset  . Bone cancer Mother     . Diabetes Father   . Stroke Father   . Breast cancer Sister   . Stomach cancer Neg Hx   . Colon cancer Neg Hx     Social History   Tobacco Use  . Smoking status: Never Smoker  . Smokeless tobacco: Never Used  Vaping Use  . Vaping Use: Never used  Substance Use Topics  . Alcohol use: No    Alcohol/week: 0.0 standard drinks  . Drug use: No    Home Medications Prior to Admission medications   Medication Sig Start Date End Date Taking? Authorizing Provider  lisinopril-hydrochlorothiazide (ZESTORETIC) 20-25 MG tablet Take 1 tablet by mouth daily.   Yes [provider]  acetaminophen (TYLENOL) 500 MG tablet Take 2 tablets (1,000 mg total) by mouth every 8 (eight) hours. 03/14/20   Nita Sells, MD  amLODipine (NORVASC) 10 MG tablet Take 1 tablet (10 mg total) by mouth daily. 03/15/20   Nita Sells, MD  cycloSPORINE (RESTASIS) 0.05 % ophthalmic emulsion Place 1 drop into both eyes 2 (two) times daily.     [provider]  hydrALAZINE (APRESOLINE) 25 MG tablet Take 1 tablet (25 mg total) by mouth every 8 (eight) hours. 03/14/20   Nita Sells, MD  hydrOXYzine (ATARAX/VISTARIL) 10 MG tablet Take 1 tablet (10 mg total) by mouth 3 (three) times daily as needed for anxiety. 03/14/20   Nita Sells, MD  levothyroxine (  SYNTHROID) 50 MCG tablet TAKE 1 TABLET BY MOUTH EVERY MORNING 08/13/19   Luetta Nutting, DO  ondansetron (ZOFRAN-ODT) 4 MG disintegrating tablet Take 4 mg by mouth daily as needed for nausea. 03/04/20   [provider]  oxyCODONE (OXY IR/ROXICODONE) 5 MG immediate release tablet Take 1 tablet (5 mg total) by mouth every 4 (four) hours as needed for moderate pain. 03/14/20   Nita Sells, MD  potassium chloride SA (KLOR-CON) 20 MEQ tablet Take 2 tablets (40 mEq total) by mouth 2 (two) times daily. 03/14/20   Nita Sells, MD  sodium chloride 1 g tablet Take 2 tablets (2 g total) by mouth 2 (two) times daily with a  meal. 03/14/20   Nita Sells, MD  traMADol (ULTRAM) 50 MG tablet Take 1 tablet (50 mg total) by mouth every 8 (eight) hours as needed for moderate pain. 03/14/20   Nita Sells, MD    Allergies    Codeine and Sulfamethoxazole  Review of Systems   Review of Systems  Constitutional: Negative for diaphoresis and fever.  HENT: Negative for congestion.   Eyes: Negative for visual disturbance.  Respiratory: Positive for shortness of breath. Negative for cough.   Cardiovascular: Negative for chest pain.  Gastrointestinal: Negative for abdominal pain and vomiting.  Genitourinary: Negative for difficulty urinating.  Musculoskeletal: Negative for arthralgias.  Skin: Negative for rash.  Neurological: Negative for dizziness.  Psychiatric/Behavioral: Negative for agitation.  All other systems reviewed and are negative.   Physical Exam Updated Vital Signs BP 140/74   Pulse 96   Temp 98 F (36.7 C) (Oral)   Resp 15   Ht 5\' 7"  (1.702 m)   Wt 72.6 kg   SpO2 91%   BMI 25.06 kg/m   Physical Exam Vitals and nursing note reviewed.  Constitutional:      General: She is not in acute distress.    Appearance: Normal appearance.  HENT:     Head: Normocephalic and atraumatic.     Nose: Nose normal.  Eyes:     Conjunctiva/sclera: Conjunctivae normal.     Pupils: Pupils are equal, round, and reactive to light.  Cardiovascular:     Rate and Rhythm: Normal rate and regular rhythm.     Pulses: Normal pulses.     Heart sounds: Normal heart sounds.  Pulmonary:     Effort: Tachypnea present.     Breath sounds: Normal breath sounds.  Abdominal:     General: Abdomen is flat. Bowel sounds are normal.     Palpations: Abdomen is soft.     Tenderness: There is no abdominal tenderness. There is no guarding or rebound.  Musculoskeletal:        General: Normal range of motion.     Cervical back: Normal range of motion and neck supple.  Skin:    General: Skin is warm and dry.      Capillary Refill: Capillary refill takes less than 2 seconds.  Neurological:     Mental Status: She is alert.     Deep Tendon Reflexes: Reflexes normal.  Psychiatric:        Mood and Affect: Mood normal.        Behavior: Behavior normal.     ED Results / Procedures / Treatments   Labs (all labs ordered are listed, but only abnormal results are displayed) Results for orders placed or performed during the hospital encounter of 03/25/20  CBC with Differential/Platelet  Result Value Ref Range   WBC 6.0 4.0 - 10.5  K/uL   RBC 3.39 (L) 3.87 - 5.11 MIL/uL   Hemoglobin 10.4 (L) 12.0 - 15.0 g/dL   HCT 31.4 (L) 36 - 46 %   MCV 92.6 80.0 - 100.0 fL   MCH 30.7 26.0 - 34.0 pg   MCHC 33.1 30.0 - 36.0 g/dL   RDW 16.6 (H) 11.5 - 15.5 %   Platelets 326 150 - 400 K/uL   nRBC 0.0 0.0 - 0.2 %   Neutrophils Relative % 76 %   Neutro Abs 4.5 1.7 - 7.7 K/uL   Lymphocytes Relative 12 %   Lymphs Abs 0.7 0.7 - 4.0 K/uL   Monocytes Relative 11 %   Monocytes Absolute 0.7 0 - 1 K/uL   Eosinophils Relative 0 %   Eosinophils Absolute 0.0 0 - 0 K/uL   Basophils Relative 0 %   Basophils Absolute 0.0 0 - 0 K/uL   Immature Granulocytes 1 %   Abs Immature Granulocytes 0.04 0.00 - 0.07 K/uL  Brain natriuretic peptide  Result Value Ref Range   B Natriuretic Peptide 308.2 (H) 0.0 - 100.0 pg/mL  I-stat chem 8, ED (not at [facility] or [facility])  Result Value Ref Range   Sodium 131 (L) 135 - 145 mmol/L   Potassium 4.8 3.5 - 5.1 mmol/L   Chloride 102 98 - 111 mmol/L   BUN 9 8 - 23 mg/dL   Creatinine, Ser 0.70 0.44 - 1.00 mg/dL   Glucose, Bld 108 (H) 70 - 99 mg/dL   Calcium, Ion 1.12 (L) 1.15 - 1.40 mmol/L   TCO2 20 (L) 22 - 32 mmol/L   Hemoglobin 10.9 (L) 12.0 - 15.0 g/dL   HCT 32.0 (L) 36 - 46 %   DG Chest 2 View  Result Date: 03/25/2020 CLINICAL DATA:  Shortness of breath. Fell with fractured ribs on the right on 03/05/2020. Increasing shortness of breath and low oxygen saturation tonight. EXAM: CHEST - 2 VIEW  COMPARISON:  03/05/2020 FINDINGS: Cardiac enlargement. Probable emphysematous changes in the lungs. Since the previous study, there are increasing infiltrates or atelectasis in both lung bases with developing moderate pleural effusions bilaterally. Multiple displaced right rib fractures as seen previously. No obvious pneumothorax. IMPRESSION: 1. Cardiac enlargement. 2. Developing moderate bilateral pleural effusions with developing basilar atelectasis or infiltration. 3. Multiple displaced right rib fractures as seen previously. Electronically Signed   By: Lucienne Capers M.D.   On: 03/25/2020 03:14   DG Shoulder Right  Result Date: 03/05/2020 CLINICAL DATA:  Fall with shoulder pain EXAM: RIGHT SHOULDER - 2+ VIEW COMPARISON:  None. FINDINGS: Posterior right seventh and eighth rib fractures. No visible pneumothorax but there is soft tissue gas on the lateral view and marked displacement of 1 of the fractures. Corticated defect in the acromion. Offset at the acromioclavicular joint by at least 50%. No fracture deformity. No coracoclavicular interval widening IMPRESSION: 1. AC joint offset suggesting separated shoulder. 2. Right rib fractures involving the seventh and eighth ribs at least. One of these fractures is markedly displaced and there is adjacent chest wall emphysema. No visible pneumothorax. 3. Os acromiale. Electronically Signed   By: Monte Fantasia M.D.   On: 03/05/2020 09:48   CT Angio Chest PE W and/or Wo Contrast  Result Date: 03/25/2020 CLINICAL DATA:  Chest pain. Shortness of breath. Hypoxia. Recent fall with rib fractures. EXAM: CT ANGIOGRAPHY CHEST WITH CONTRAST TECHNIQUE: Multidetector CT imaging of the chest was performed using the standard protocol during bolus administration of intravenous contrast. Multiplanar CT image  reconstructions and MIPs were obtained to evaluate the vascular anatomy. CONTRAST:  148mL OMNIPAQUE IOHEXOL 350 MG/ML SOLN COMPARISON:  Two-view chest x-ray FINDINGS:  Cardiovascular: The heart size is normal. Aortic arch and great vessel origins are within normal limits. Pulmonary artery opacification is satisfactory. Pulmonary artery size is normal. No focal filling defects are present to suggest pulmonary embolus. Mediastinum/Nodes: No significant mediastinal, hilar, or axillary adenopathy is present. Lungs/Pleura: Moderate size right pleural effusion is present. Right lower lobe atelectasis is associated. Smaller left pleural effusion is present. More mild volume loss is present on the left. Fluid is present along the minor fissure with mild associated atelectasis. Minimal ground-glass attenuation is present otherwise. Upper Abdomen: . water density 2 cm cyst is present at the posterior right kidney. Visualized upper abdomen is otherwise unremarkable. Musculoskeletal: Right posterior 6-11 rib fractures are noted. No pneumothorax is present. Exaggerated thoracic kyphosis is present. Vertebral body heights are maintained. No focal lytic or blastic lesions are present. Review of the MIP images confirms the above findings. IMPRESSION: 1. No pulmonary embolus. 2. Moderate size right pleural effusion and small left pleural effusion. 3. Right lower lobe volume loss/atelectasis. 4. Minimal volume loss at the left base. 5. Minimal scattered ground-glass attenuation likely represents edema or atelectasis. Infection is considered less likely. 6. Right posterior 6-11 rib fractures without pneumothorax. 7. Exaggerated thoracic kyphosis. Electronically Signed   By: San Morelle M.D.   On: 03/25/2020 05:03   DG Chest Portable 1 View  Result Date: 03/05/2020 CLINICAL DATA:  Fall with rib fractures on shoulder radiograph EXAM: PORTABLE CHEST 1 VIEW COMPARISON:  11/10/2005 FINDINGS: Right seventh, eighth, and ninth rib fractures with prominent displacement. Streaky opacity at the right base. No visible effusion or pneumothorax. Mild cardiomegaly. Aortic tortuosity. Shoulder findings  as on dedicated study IMPRESSION: 1. Right seventh through ninth rib fractures with prominent displacement. 2. Atelectasis at the right base.  No visible pneumothorax. Electronically Signed   By: Monte Fantasia M.D.   On: 03/05/2020 11:54    EKG EKG Interpretation  Date/Time:  Tuesday March 25 2020 02:56:29 EDT Ventricular Rate:  93 PR Interval:  142 QRS Duration: 136 QT Interval:  396 QTC Calculation: 492 R Axis:     Text Interpretation: Sinus rhythm with Premature supraventricular complexes Confirmed by Dory Horn) on 03/25/2020 3:48:53 AM   Radiology DG Chest 2 View  Result Date: 03/25/2020 CLINICAL DATA:  Shortness of breath. Fell with fractured ribs on the right on 03/05/2020. Increasing shortness of breath and low oxygen saturation tonight. EXAM: CHEST - 2 VIEW COMPARISON:  03/05/2020 FINDINGS: Cardiac enlargement. Probable emphysematous changes in the lungs. Since the previous study, there are increasing infiltrates or atelectasis in both lung bases with developing moderate pleural effusions bilaterally. Multiple displaced right rib fractures as seen previously. No obvious pneumothorax. IMPRESSION: 1. Cardiac enlargement. 2. Developing moderate bilateral pleural effusions with developing basilar atelectasis or infiltration. 3. Multiple displaced right rib fractures as seen previously. Electronically Signed   By: Lucienne Capers M.D.   On: 03/25/2020 03:14   CT Angio Chest PE W and/or Wo Contrast  Result Date: 03/25/2020 CLINICAL DATA:  Chest pain. Shortness of breath. Hypoxia. Recent fall with rib fractures. EXAM: CT ANGIOGRAPHY CHEST WITH CONTRAST TECHNIQUE: Multidetector CT imaging of the chest was performed using the standard protocol during bolus administration of intravenous contrast. Multiplanar CT image reconstructions and MIPs were obtained to evaluate the vascular anatomy. CONTRAST:  169mL OMNIPAQUE IOHEXOL 350 MG/ML SOLN COMPARISON:  Two-view  chest x-ray FINDINGS:  Cardiovascular: The heart size is normal. Aortic arch and great vessel origins are within normal limits. Pulmonary artery opacification is satisfactory. Pulmonary artery size is normal. No focal filling defects are present to suggest pulmonary embolus. Mediastinum/Nodes: No significant mediastinal, hilar, or axillary adenopathy is present. Lungs/Pleura: Moderate size right pleural effusion is present. Right lower lobe atelectasis is associated. Smaller left pleural effusion is present. More mild volume loss is present on the left. Fluid is present along the minor fissure with mild associated atelectasis. Minimal ground-glass attenuation is present otherwise. Upper Abdomen: . water density 2 cm cyst is present at the posterior right kidney. Visualized upper abdomen is otherwise unremarkable. Musculoskeletal: Right posterior 6-11 rib fractures are noted. No pneumothorax is present. Exaggerated thoracic kyphosis is present. Vertebral body heights are maintained. No focal lytic or blastic lesions are present. Review of the MIP images confirms the above findings. IMPRESSION: 1. No pulmonary embolus. 2. Moderate size right pleural effusion and small left pleural effusion. 3. Right lower lobe volume loss/atelectasis. 4. Minimal volume loss at the left base. 5. Minimal scattered ground-glass attenuation likely represents edema or atelectasis. Infection is considered less likely. 6. Right posterior 6-11 rib fractures without pneumothorax. 7. Exaggerated thoracic kyphosis. Electronically Signed   By: San Morelle M.D.   On: 03/25/2020 05:03    Procedures Procedures (including critical care time)  Medications Ordered in ED Medications  vancomycin (VANCOCIN) IVPB 1000 mg/200 mL premix (has no administration in time range)  ceFEPIme (MAXIPIME) 1 g in sodium chloride 0.9 % 100 mL IVPB (has no administration in time range)  furosemide (LASIX) injection 40 mg (has no administration in time range)  iohexol  (OMNIPAQUE) 350 MG/ML injection 100 mL (100 mLs Intravenous Contrast Given 03/25/20 0436)    ED Course  I have reviewed the triage vital signs and the nursing notes.  Pertinent labs & imaging results that were available during my care of the patient were reviewed by me and considered in my medical decision making (see chart for details).   This is likely pulmonary edema but given there is a question with the rib fractures I have cultured the patient and started a dose of IV antibiotics.    Final Clinical Impression(s) / ED Diagnoses Final diagnoses:  Pleural effusion  Acute pulmonary edema (HCC)  Closed fracture of multiple ribs, unspecified laterality, sequela   Admit to medicine    Jb Dulworth, MD 03/25/20 5916

## 2020-03-25 NOTE — ED Triage Notes (Addendum)
Pt BIB EMS from rehab facility. EMS reports facility could not get pts o2sat above 88%. When EMS arrived, pt was 92% on 3L [place]. Pt not on home o2. Hx of broken ribs from a fall. LS clear bilaterally. Denies pain. Pt c/o shob upon arrival to ED.

## 2020-03-25 NOTE — Evaluation (Signed)
Physical Therapy Evaluation Patient Details Name: Christy Wade MRN: [id number] DOB: 1936-03-22 Today's Date: 03/25/2020   History of Present Illness  84 yo female admitted with acute respiratory failure. Recent hx of R AC shoulder separation, R rib fxs.  Clinical Impression  On eval in ED, pt required Mod assist for mobility. She was able to perform a stand pivot x 2 with RW. Posterior bias with LOB without external support/assistance. Pt presents with general weakness, decreased activity tolerance, and impaired gait and balance. O2 90% on 3L [place] O2 with activity. Will continue to follow and progress activity as tolerated. Recommend return to SNF for continued rehab.     Follow Up Recommendations SNF    Equipment Recommendations  None recommended by PT    Recommendations for Other Services       Precautions / Restrictions Precautions Precautions: Fall Precaution Comments: R shoulder sling 2 weeks ago? for [facility] separation??? Restrictions Weight Bearing Restrictions: No RUE Weight Bearing: Weight bearing as tolerated Other Position/Activity Restrictions: confirmed WBAT R UE (last admission)      Mobility  Bed Mobility Overal bed mobility: Needs Assistance Bed Mobility: Supine to Sit;Sit to Supine     Supine to sit: Mod assist;HOB elevated Sit to supine: Mod assist;HOB elevated   General bed mobility comments: Assist for trunk and bil LEs. Increased time.  Transfers Overall transfer level: Needs assistance Equipment used: Rolling walker (2 wheeled) Transfers: Sit to/from Stand Sit to Stand: Mod assist Stand pivot transfers: Mod assist       General transfer comment: Assist to power up, stabilize, control descent. Posterior bias. Very unsteady. High fall risk. Stand pivot x 2, bed<>, bsc with RW. O2 90% on 3L [place].  Ambulation/Gait                Stairs            Wheelchair Mobility    Modified Rankin (Stroke Patients Only)       Balance Overall  balance assessment: Needs assistance;History of Falls         Standing balance support: Bilateral upper extremity supported Standing balance-Leahy Scale: Poor                               Pertinent Vitals/Pain Pain Assessment: Faces Faces Pain Scale: Hurts a little bit Pain Location: R shoulder/ribs Pain Descriptors / Indicators: Sore Pain Intervention(s): Monitored during session;Repositioned    Home Living Family/patient expects to be discharged to:: Skilled nursing facility                      Prior Function           Comments: working with PT-has been ambulating     Hand Dominance        Extremity/Trunk Assessment   Upper Extremity Assessment Upper Extremity Assessment: Defer to OT evaluation    Lower Extremity Assessment Lower Extremity Assessment: Generalized weakness    Cervical / Trunk Assessment Cervical / Trunk Assessment: Kyphotic  Communication   Communication: No difficulties  Cognition Arousal/Alertness: Awake/alert Behavior During Therapy: WFL for tasks assessed/performed Overall Cognitive Status: Within Functional Limits for tasks assessed                                        General Comments      Exercises  Assessment/Plan    PT Assessment Patient needs continued PT services  PT Problem List Decreased strength;Decreased mobility;Decreased activity tolerance;Decreased balance;Decreased knowledge of use of DME       PT Treatment Interventions DME instruction;Gait training;Therapeutic activities;Therapeutic exercise;Patient/family education;Balance training;Functional mobility training    PT Goals (Current goals can be found in the Care Plan section)  Acute Rehab PT Goals Patient Stated Goal: get back home, get strength back PT Goal Formulation: With patient Time For Goal Achievement: 04/08/20 Potential to Achieve Goals: Good    Frequency Min 3X/week   Barriers to discharge         Co-evaluation               AM-PAC PT "6 Clicks" Mobility  Outcome Measure Help needed turning from your back to your side while in a flat bed without using bedrails?: A Lot Help needed moving from lying on your back to sitting on the side of a flat bed without using bedrails?: A Lot Help needed moving to and from a bed to a chair (including a wheelchair)?: A Lot Help needed standing up from a chair using your arms (e.g., wheelchair or bedside chair)?: A Lot Help needed to walk in hospital room?: A Lot Help needed climbing 3-5 steps with a railing? : Total 6 Click Score: 11    End of Session Equipment Utilized During Treatment: Gait belt;Oxygen Activity Tolerance: Patient tolerated treatment well Patient left: in bed;with call bell/phone within reach   PT Visit Diagnosis: Muscle weakness (generalized) (M62.81);Difficulty in walking, not elsewhere classified (R26.2);History of falling (Z91.81);Unsteadiness on feet (R26.81)    Time: 1173-5670 PT Time Calculation (min) (ACUTE ONLY): 29 min   Charges:   PT Evaluation $PT Eval Low Complexity: 1 Low PT Treatments $Gait Training: 8-22 mins          Doreatha Massed, PT Acute Rehabilitation  Office: [phone] Pager: [phone]

## 2020-03-25 NOTE — ED Notes (Signed)
Lunch tray delivered.

## 2020-03-25 NOTE — H&P (Addendum)
History and Physical        Hospital Admission Note Date: 03/25/2020  Patient name: Christy Wade Medical record number: [id number] Date of birth: 1935-07-26 Age: 84 y.o. Gender: female  PCP: Ronnald Nian, DO  Patient coming from: SNF   Chief Complaint    No chief complaint on file.     HPI:   This is an 84 year old female with past medical history of hypertension, hypothyroidism, osteoporosis, s/p laparoscopic intestinal resection 12/02/2014, chronic diarrhea who was recently hospitalized from 8/18-8/27 after she had a fall with subsequent right-sided rib fractures 7-9, Klebsiella pneumonia UTI and severe symptomatic hyponatremia (109) treated in the ICU by nephrology and was eventually discharged in stable condition to SNF who was brought in today by EMS from her rehab facility as her facility could not get her O2 sat above 88% and was placed on 3 L nasal cannula by EMS and complained of shortness of breath upon arrival to the ED.  ED Course: Vitals: T 90 25F, HR 95, RR 16, BP 158/79, SPO2 84% on RA-> 91% on 3 L nasal cannula.  Notable labs: Sodium 131 (previous 127 on 8/30), ionized calcium 1.12, BNP 308, troponin 16, Hb 10.4.  Over 19 pending.  CXR: Moderate bilateral pleural effusions with basilar atelectasis and multiple displaced right rib fractures previously seen.  CTA chest: Moderate right pleural effusion, small left pleural effusion, RLL volume loss/atelectasis groundglass representing edema or atelectasis (less likely infection), right posterior 6-11 rib fractures without pneumothorax, exaggerated thoracic kyphosis and no PE found.  Blood culture drawn and given a dose of IV vancomycin/cefepime.   Review of Systems:  Review of Systems  Constitutional: Negative for chills and fever.  Respiratory: Positive for shortness of breath. Negative for wheezing.     Cardiovascular: Positive for leg swelling. Negative for chest pain and palpitations.  Gastrointestinal: Negative for nausea and vomiting.  Genitourinary: Negative for dysuria.  Musculoskeletal: Negative for back pain and falls.       Does not endorse rib pain but also avoiding sitting upright to avoid pain  Neurological: Negative for focal weakness.  All other systems reviewed and are negative.   Medical/Social/Family History   Past Medical History: Past Medical History:  Diagnosis Date  . Anemia    on iron now  . Cataract   . Hypertension   . Hypothyroidism   . Osteoporosis   . PONV (postoperative nausea and vomiting)   . Thyroid disease   . Varicose vein     Past Surgical History:  Procedure Laterality Date  . ABDOMINAL HYSTERECTOMY  1975   partial  . APPENDECTOMY  1975   with hysterectomy  . HEMORROIDECTOMY     [phone]  . LAPAROSCOPIC SMALL BOWEL RESECTION N/A 11/29/2014   Procedure: LAPAROSCOPIC SMALL INTESTINE RESECTION;  Surgeon: Michael Boston, MD;  Location: [facility];  Service: General;  Laterality: N/A;  . VARICOSE VEIN SURGERY Left 1971    Medications: Prior to Admission medications   Medication Sig Start Date End Date Taking? Authorizing Provider  lisinopril-hydrochlorothiazide (ZESTORETIC) 20-25 MG tablet Take 1 tablet by mouth daily.   Yes [provider]  acetaminophen (TYLENOL) 500 MG tablet Take 2 tablets (1,000 mg total) by  mouth every 8 (eight) hours. 03/14/20   Nita Sells, MD  amLODipine (NORVASC) 10 MG tablet Take 1 tablet (10 mg total) by mouth daily. 03/15/20   Nita Sells, MD  cycloSPORINE (RESTASIS) 0.05 % ophthalmic emulsion Place 1 drop into both eyes 2 (two) times daily.     [provider]  hydrALAZINE (APRESOLINE) 25 MG tablet Take 1 tablet (25 mg total) by mouth every 8 (eight) hours. 03/14/20   Nita Sells, MD  hydrOXYzine (ATARAX/VISTARIL) 10 MG tablet Take 1 tablet (10 mg total) by mouth 3  (three) times daily as needed for anxiety. 03/14/20   Nita Sells, MD  levothyroxine (SYNTHROID) 50 MCG tablet TAKE 1 TABLET BY MOUTH EVERY MORNING 08/13/19   Luetta Nutting, DO  ondansetron (ZOFRAN-ODT) 4 MG disintegrating tablet Take 4 mg by mouth daily as needed for nausea. 03/04/20   [provider]  oxyCODONE (OXY IR/ROXICODONE) 5 MG immediate release tablet Take 1 tablet (5 mg total) by mouth every 4 (four) hours as needed for moderate pain. 03/14/20   Nita Sells, MD  potassium chloride SA (KLOR-CON) 20 MEQ tablet Take 2 tablets (40 mEq total) by mouth 2 (two) times daily. 03/14/20   Nita Sells, MD  sodium chloride 1 g tablet Take 2 tablets (2 g total) by mouth 2 (two) times daily with a meal. 03/14/20   Nita Sells, MD  traMADol (ULTRAM) 50 MG tablet Take 1 tablet (50 mg total) by mouth every 8 (eight) hours as needed for moderate pain. 03/14/20   Nita Sells, MD    Allergies:   Allergies  Allergen Reactions  . Codeine Nausea Only    REACTION: nausea  . Sulfamethoxazole Rash    REACTION: rash    Social History:  reports that she has never smoked. She has never used smokeless tobacco. She reports that she does not drink alcohol and does not use drugs.  Family History: Family History  Problem Relation Age of Onset  . Bone cancer Mother   . Diabetes Father   . Stroke Father   . Breast cancer Sister   . Stomach cancer Neg Hx   . Colon cancer Neg Hx      Objective   Physical Exam: Blood pressure (!) 147/72, pulse 94, temperature 98.3 F (36.8 C), temperature source Oral, resp. rate 18, height 5\' 7"  (1.702 m), weight 72.6 kg, SpO2 96 %.  Physical Exam Vitals and nursing note reviewed.  Constitutional:      Appearance: Normal appearance.     Comments: Leaning to left, does not want to sit upright to avoid causing pain to ribs  HENT:     Head: Normocephalic and atraumatic.  Eyes:     Conjunctiva/sclera: Conjunctivae  normal.  Cardiovascular:     Rate and Rhythm: Normal rate and regular rhythm.  Pulmonary:     Comments: Conversational dyspnea  Abdominal:     General: Abdomen is flat.     Palpations: Abdomen is soft.  Musculoskeletal:     Comments: Trace-1+ bilateral lower extremity edema  Skin:    Coloration: Skin is not jaundiced or pale.  Neurological:     Mental Status: She is alert. Mental status is at baseline.  Psychiatric:        Mood and Affect: Mood normal.        Behavior: Behavior normal.     LABS on Admission: I have personally reviewed all the labs and imaging below    Basic Metabolic Panel: Recent Labs  Lab 03/25/20  0418  NA 131*  K 4.8  CL 102  GLUCOSE 108*  BUN 9  CREATININE 0.70   Liver Function Tests: No results for input(s): AST, ALT, ALKPHOS, BILITOT, PROT, ALBUMIN in the last 168 hours. No results for input(s): LIPASE, AMYLASE in the last 168 hours. No results for input(s): AMMONIA in the last 168 hours. CBC: Recent Labs  Lab 03/25/20 0415 03/25/20 0418  WBC 6.0  --   NEUTROABS 4.5  --   HGB 10.4* 10.9*  HCT 31.4* 32.0*  MCV 92.6  --   PLT 326  --    Cardiac Enzymes: No results for input(s): CKTOTAL, CKMB, CKMBINDEX, TROPONINI in the last 168 hours. BNP: Invalid input(s): POCBNP CBG: No results for input(s): GLUCAP in the last 168 hours.  Radiological Exams on Admission:  DG Chest 2 View  Result Date: 03/25/2020 CLINICAL DATA:  Shortness of breath. Fell with fractured ribs on the right on 03/05/2020. Increasing shortness of breath and low oxygen saturation tonight. EXAM: CHEST - 2 VIEW COMPARISON:  03/05/2020 FINDINGS: Cardiac enlargement. Probable emphysematous changes in the lungs. Since the previous study, there are increasing infiltrates or atelectasis in both lung bases with developing moderate pleural effusions bilaterally. Multiple displaced right rib fractures as seen previously. No obvious pneumothorax. IMPRESSION: 1. Cardiac enlargement. 2.  Developing moderate bilateral pleural effusions with developing basilar atelectasis or infiltration. 3. Multiple displaced right rib fractures as seen previously. Electronically Signed   By: Lucienne Capers M.D.   On: 03/25/2020 03:14   CT Angio Chest PE W and/or Wo Contrast  Result Date: 03/25/2020 CLINICAL DATA:  Chest pain. Shortness of breath. Hypoxia. Recent fall with rib fractures. EXAM: CT ANGIOGRAPHY CHEST WITH CONTRAST TECHNIQUE: Multidetector CT imaging of the chest was performed using the standard protocol during bolus administration of intravenous contrast. Multiplanar CT image reconstructions and MIPs were obtained to evaluate the vascular anatomy. CONTRAST:  148mL OMNIPAQUE IOHEXOL 350 MG/ML SOLN COMPARISON:  Two-view chest x-ray FINDINGS: Cardiovascular: The heart size is normal. Aortic arch and great vessel origins are within normal limits. Pulmonary artery opacification is satisfactory. Pulmonary artery size is normal. No focal filling defects are present to suggest pulmonary embolus. Mediastinum/Nodes: No significant mediastinal, hilar, or axillary adenopathy is present. Lungs/Pleura: Moderate size right pleural effusion is present. Right lower lobe atelectasis is associated. Smaller left pleural effusion is present. More mild volume loss is present on the left. Fluid is present along the minor fissure with mild associated atelectasis. Minimal ground-glass attenuation is present otherwise. Upper Abdomen: . water density 2 cm cyst is present at the posterior right kidney. Visualized upper abdomen is otherwise unremarkable. Musculoskeletal: Right posterior 6-11 rib fractures are noted. No pneumothorax is present. Exaggerated thoracic kyphosis is present. Vertebral body heights are maintained. No focal lytic or blastic lesions are present. Review of the MIP images confirms the above findings. IMPRESSION: 1. No pulmonary embolus. 2. Moderate size right pleural effusion and small left pleural  effusion. 3. Right lower lobe volume loss/atelectasis. 4. Minimal volume loss at the left base. 5. Minimal scattered ground-glass attenuation likely represents edema or atelectasis. Infection is considered less likely. 6. Right posterior 6-11 rib fractures without pneumothorax. 7. Exaggerated thoracic kyphosis. Electronically Signed   By: San Morelle M.D.   On: 03/25/2020 05:03      EKG: Independently reviewed.    A & P   Principal Problem:   Respiratory failure ([place]) Active Problems:   Hypothyroidism   Essential hypertension   Hyponatremia  Multiple fractures of ribs, right side, subsequent encounter for fracture with routine healing   1. Acute hypoxic respiratory failure, multifactorial: atelectasis s/p prior rib fractures, moderate right-small left pleural effusion a. SPO2 84% on RA, satting well on 3 L/min b. Incentive spirometry c. Lasix 40 mg IV x1 d. Out of bed as tolerated e. PT f. Afebrile, no leukocytosis - discontinue antibiotics  2. Right posterior 6-11 rib fractures s/p prior fall (recently hospitalized from 8/18-8/27) a. Continue pain management with bowel regimen b. PT c. Out of bed as tolerated  3. Volume overloaded a. BNP 308 b. No echo on file c. Lasix 40 mg IV x1 d. will check an echo e. Daily weights and intake/output  4. Hyponatremia a. Recently hospitalized for hyponatremia 109 treated with hypertonic saline, salt tabs and fluid restriction  b. Sodium currently 131 c. Continue salt tabs and fluid restriction d. Holding HCTZ and lisinopril  5. Hypertension a. Hold home lisinopril-HCTZ b. Continue hydralazine and amlodipine, as needed metoprolol  6. Hypothyroidism a. Continue levothyroxine   DVT prophylaxis: Lovenox   Code Status: DNR  Diet: Heart healthy with fluid restriction Family Communication: Admission, patients condition and plan of care including tests being ordered have been discussed with the patient who indicates  understanding and agrees with the plan and Code Status. Patient's daughter was updated  Disposition Plan: The appropriate patient status for this patient is INPATIENT. Inpatient status is judged to be reasonable and necessary in order to provide the required intensity of service to ensure the patient's safety. The patient's presenting symptoms, physical exam findings, and initial radiographic and laboratory data in the context of their chronic comorbidities is felt to place them at high risk for further clinical deterioration. Furthermore, it is not anticipated that the patient will be medically stable for discharge from the hospital within 2 midnights of admission. The following factors support the patient status of inpatient.   " The patient's presenting symptoms include shortness of breath. " The worrisome physical exam findings include volume overload. " The initial radiographic and laboratory data are worrisome because of pleural effusion. " The chronic co-morbidities include rib fractures, hypertension, hyponatremia.   * I certify that at the point of admission it is my clinical judgment that the patient will require inpatient hospital care spanning beyond 2 midnights from the point of admission due to high intensity of service, high risk for further deterioration and high frequency of surveillance required.*   Status is: Inpatient  Remains inpatient appropriate because:Inpatient level of care appropriate due to severity of illness   Dispo: The patient is from: SNF              Anticipated d/c is to: SNF              Anticipated d/c date is: 2 days              Patient currently is not medically stable to d/c.     Consultants  . None  Procedures  . None  Time Spent on Admission: 64 minutes    Harold Hedge, DO [facility] Pager [phone] 03/25/2020, 9:31 AM

## 2020-03-25 NOTE — ED Notes (Signed)
Unable to draw 2nd culture

## 2020-03-26 DIAGNOSIS — S2249XS Multiple fractures of ribs, unspecified side, sequela: Secondary | ICD-10-CM

## 2020-03-26 DIAGNOSIS — J90 Pleural effusion, not elsewhere classified: Secondary | ICD-10-CM

## 2020-03-26 DIAGNOSIS — J810 Acute pulmonary edema: Secondary | ICD-10-CM

## 2020-03-26 LAB — BASIC METABOLIC PANEL
Anion gap: 12 (ref 5–15)
BUN: 13 mg/dL (ref 8–23)
CO2: 21 mmol/L — ABNORMAL LOW (ref 22–32)
Calcium: 8.5 mg/dL — ABNORMAL LOW (ref 8.9–10.3)
Chloride: 101 mmol/L (ref 98–111)
Creatinine, Ser: 0.84 mg/dL (ref 0.44–1.00)
GFR calc Af Amer: >60 mL/min (ref >60)
GFR calc non Af Amer: >60 mL/min (ref >60)
Glucose, Bld: 105 mg/dL — ABNORMAL HIGH (ref 70–99)
Potassium: 3.9 mmol/L (ref 3.5–5.1)
Sodium: 134 mmol/L — ABNORMAL LOW (ref 135–145)

## 2020-03-26 LAB — CBC
HCT: 31.5 % — ABNORMAL LOW (ref 36.0–46.0)
Hemoglobin: 10.1 g/dL — ABNORMAL LOW (ref 12.0–15.0)
MCH: 30.1 pg (ref 26.0–34.0)
MCHC: 32.1 g/dL (ref 30.0–36.0)
MCV: 93.8 fL (ref 80.0–100.0)
Platelets: 303 10*3/uL (ref 150–400)
RBC: 3.36 MIL/uL — ABNORMAL LOW (ref 3.87–5.11)
RDW: 16.7 % — ABNORMAL HIGH (ref 11.5–15.5)
WBC: 6.2 10*3/uL (ref 4.0–10.5)
nRBC: 0 % (ref 0.0–0.2)

## 2020-03-26 LAB — MRSA PCR SCREENING: MRSA by PCR: NEGATIVE

## 2020-03-26 MED ORDER — CHLORHEXIDINE GLUCONATE CLOTH 2 % EX PADS
6.0000 | MEDICATED_PAD | Freq: Every day | CUTANEOUS | Status: DC
  Administered 2020-03-26: 6 via TOPICAL

## 2020-03-26 MED ORDER — POTASSIUM CHLORIDE CRYS ER 20 MEQ PO TBCR
40.0000 meq | EXTENDED_RELEASE_TABLET | Freq: Every day | ORAL | Status: DC
  Administered 2020-03-27 – 2020-03-28 (×2): 40 meq via ORAL
  Filled 2020-03-26 (×2): qty 2

## 2020-03-26 MED ORDER — MAGIC MOUTHWASH
10.0000 mL | Freq: Four times a day (QID) | ORAL | Status: DC | PRN
  Administered 2020-03-26: 10 mL via ORAL
  Filled 2020-03-26 (×2): qty 10

## 2020-03-26 NOTE — Evaluation (Signed)
Occupational Therapy Evaluation Patient Details Name: Christy Wade MRN: [id number] DOB: [birth year] Today's Date: 03/26/2020    History of Present Illness 84 yo female admitted with acute respiratory failure. Recent hx of R AC shoulder separation, R rib fxs.   Clinical Impression   Christy Wade is an 84 year old woman admitted to hospital with acute respiratory failure and currently on 3 liters of oxygen. On evaluation patient demonstrates decreased ROM and strength of RUE, decreased activity tolerance, reports of pain in right arm and decreased balance resulting in impaired ability to perform independent ADLs and functional mobility. Patient transferred to [facility] for toileting and then transferred to recliner with min guard. Patient required frequent and long rest breaks to recover reporting shortness of breath but with sats WFL. With initial transfer to [facility] patient's o2 sat dropped to 88%. Patient will benefit from skilled OT services to improve deficits and independence. Therapist recommends return to SNF for rehab.    Follow Up Recommendations  SNF    Equipment Recommendations  Other (comment) (defer to next venue)    Recommendations for Other Services       Precautions / Restrictions Precautions Precautions: Fall Precaution Comments: R shoulder sling 2 weeks ago? for [facility] separation, R rib fx Required Braces or Orthoses: Sling Restrictions Weight Bearing Restrictions: No RUE Weight Bearing: Weight bearing as tolerated Other Position/Activity Restrictions: confirmed WBAT R UE (last admission)      Mobility Bed Mobility Overal bed mobility: Needs Assistance Bed Mobility: Supine to Sit     Supine to sit: Supervision        Transfers Overall transfer level: Needs assistance Equipment used: None Transfers: Sit to/from Stand Sit to Stand: Min guard Stand pivot transfers: Min guard       General transfer comment: Min guard for standing and transferring to [facility].  patinet using bed rail and chair arms as hand holds. Kyphotic posture.    Balance Overall balance assessment: Needs assistance Sitting-balance support: No upper extremity supported;Feet supported Sitting balance-Leahy Scale: Good     Standing balance support: Single extremity supported Standing balance-Leahy Scale: Poor                             ADL either performed or assessed with clinical judgement   ADL Overall ADL's : Needs assistance/impaired Eating/Feeding: Set up;Sitting   Grooming: Set up;Sitting   Upper Body Bathing: Set up;Sitting   Lower Body Bathing: Set up;Sit to/from stand;Minimal assistance   Upper Body Dressing : Set up;Sitting;Minimal assistance   Lower Body Dressing: Moderate assistance;Sitting/lateral leans;Sit to/from stand   Toilet Transfer: BSC;Stand-pivot;Min guard   Toileting- Water quality scientist and Hygiene: Set up;Sitting/lateral lean Toileting - Clothing Manipulation Details (indicate cue type and reason): Patient able to wipe in seated position.             Vision Patient Visual Report: No change from baseline       Perception     Praxis      Pertinent Vitals/Pain Pain Assessment: Faces Faces Pain Scale: Hurts a little bit Pain Location: R shoulder/ribs Pain Descriptors / Indicators: Sore Pain Intervention(s): Monitored during session     Hand Dominance Right   Extremity/Trunk Assessment Upper Extremity Assessment Upper Extremity Assessment: RUE deficits/detail;LUE deficits/detail RUE Deficits / Details: recent AC shoulder separation RUE: Unable to fully assess due to pain;Unable to fully assess due to immobilization LUE Deficits / Details: [facility]   Lower Extremity Assessment Lower  Extremity Assessment: Defer to PT evaluation   Cervical / Trunk Assessment Cervical / Trunk Assessment: Kyphotic   Communication Communication Communication: No difficulties   Cognition Arousal/Alertness: Awake/alert Behavior  During Therapy: WFL for tasks assessed/performed Overall Cognitive Status: Within Functional Limits for tasks assessed                                 General Comments: [id number]   General Comments       Exercises     Shoulder Instructions      Home Living Family/patient expects to be discharged to:: Skilled nursing facility Living Arrangements: Alone Available Help at Discharge: Available PRN/intermittently;Family Type of Home: House Home Access: Stairs to enter [employer] of Steps: 2 steps carport   Home Layout: One level     Bathroom Shower/Tub: Walk-in shower;Tub/shower unit   Bathroom Toilet: Handicapped height Bathroom Accessibility: Yes How Accessible: Accessible via walker Home Equipment: Shower seat;Walker - 2 wheels;Cane - single point;Cane - quad;Grab bars - tub/shower   Additional Comments: sister retired Therapist, sports across street, Daughter lives in [place]. Reports family in and out all of the time.      Prior Functioning/Environment Level of Independence: Independent        Comments: Went to SNF after prior hospitilization. Prior to that independent.        OT Problem List: Decreased activity tolerance;Impaired balance (sitting and/or standing);Decreased safety awareness;Pain;Cardiopulmonary status limiting activity      OT Treatment/Interventions: Self-care/ADL training;Energy conservation;DME and/or AE instruction;Therapeutic activities;Patient/family education;Balance training;Therapeutic exercise    OT Goals(Current goals can be found in the care plan section) Acute Rehab OT Goals Patient Stated Goal: To finish rehab OT Goal Formulation: With patient Time For Goal Achievement: 04/09/20 Potential to Achieve Goals: Good  OT Frequency: Min 2X/week   Barriers to D/C:            Co-evaluation              AM-PAC OT "6 Clicks" Daily Activity     Outcome Measure Help from another person eating meals?: A Little Help from  another person taking care of personal grooming?: A Little Help from another person toileting, which includes using toliet, bedpan, or urinal?: A Little Help from another person bathing (including washing, rinsing, drying)?: A Little Help from another person to put on and taking off regular upper body clothing?: A Little Help from another person to put on and taking off regular lower body clothing?: A Lot 6 Click Score: 17   End of Session Equipment Utilized During Treatment: Oxygen Nurse Communication: Mobility status  Activity Tolerance: Patient tolerated treatment well Patient left: in chair;with call bell/phone within reach;with chair alarm set  OT Visit Diagnosis: Other abnormalities of gait and mobility (R26.89);History of falling (Z91.81);Pain Pain - Right/Left: Right Pain - part of body: Shoulder                Time: 3546-5681 OT Time Calculation (min): 37 min Charges:  OT General Charges $OT Visit: 1 Visit OT Evaluation $OT Eval Moderate Complexity: 1 Mod OT Treatments $Self Care/Home Management : 8-22 mins  Christy Wade, OTR/L [place] [phone] Pager: [place] 03/26/2020, 12:50 PM

## 2020-03-26 NOTE — TOC Initial Note (Signed)
Transition of Care [facility]) - Initial/Assessment Note    Patient Details  Name: Christy Wade MRN: [id number] Date of Birth: 18-Apr-1936  Transition of Care [facility]) CM/SW Contact:    Ross Ludwig, LCSW Phone Number: 03/26/2020, 9:59 AM  Clinical Narrative:                  Patient is a 84 year old female who is alert and oriented x4.  Patient has been at [facility] for short term rehab.  Patient was just recently discharged to SNF from hospital.  Patient plans to return, she will need to have a new authorization before she is able to return back home.  CSW to continue to follow patient's progress throughout discharge planning.  Expected Discharge Plan: Skilled Nursing Facility Barriers to Discharge: Continued Medical Work up   Patient Goals and CMS Choice Patient states their goals for this hospitalization and ongoing recovery are:: To return to SNF to continue with therapy. CMS Medicare.gov Compare Post Acute Care list provided to:: Patient Choice offered to / list presented to : Patient (Chart review)  Expected Discharge Plan and Services Expected Discharge Plan: [place] arrangements for the past 2 months: [place], [place] Expected Discharge Date:  (unknown)                                    Prior Living Arrangements/Services Living arrangements for the past 2 months: [place], [place]   Patient language and need for interpreter reviewed:: Yes Do you feel safe going back to the place where you live?: No   Patient feels she needs to continue with her therapy.  Need for Family Participation in Patient Care: No (Comment) Care giver support system in place?: No (comment)   Criminal Activity/Legal Involvement Pertinent to Current Situation/Hospitalization: No - Comment as needed  Activities of Daily Living Home Assistive Devices/Equipment: Bedside commode/3-in-1, Cane (specify  quad or straight), Walker (specify type), Shower chair without back, Other (Comment), Eyeglasses, Grab bars in shower (At home patient has front wheeled walker, single point cane, bsc, walk-in shower, tub/shower unit, handicap toilet anf grab bars) ADL Screening (condition at time of admission) Patient's cognitive ability adequate to safely complete daily activities?: Yes Is the patient deaf or have difficulty hearing?: No Does the patient have difficulty seeing, even when wearing glasses/contacts?: No Does the patient have difficulty concentrating, remembering, or making decisions?: No Patient able to express need for assistance with ADLs?: Yes Does the patient have difficulty dressing or bathing?: Yes Independently performs ADLs?: No Communication: Independent Dressing (OT): Needs assistance Is this a change from baseline?: Pre-admission baseline Grooming: Needs assistance Is this a change from baseline?: Pre-admission baseline Feeding: Needs assistance Is this a change from baseline?: Pre-admission baseline Bathing: Needs assistance Is this a change from baseline?: Pre-admission baseline Toileting: Needs assistance Is this a change from baseline?: Pre-admission baseline In/Out Bed: Needs assistance Is this a change from baseline?: Pre-admission baseline Walks in Home: Needs assistance Is this a change from baseline?: Pre-admission baseline Does the patient have difficulty walking or climbing stairs?: Yes (secondary to shortness of breath and weakness) Weakness of Legs: Both Weakness of Arms/Hands: Both  Permission Sought/Granted Permission sought to share information with : Facility Sport and exercise psychologist, Family Supports Permission granted to share information with : Yes, Release of Information Signed  Share Information with NAMEElvina Sidle Daughter   [phone] or Audry Pili [phone]  Permission granted to share info w AGENCY: SNF admissions        Emotional  Assessment Appearance:: Appears stated age   Affect (typically observed): Accepting, Appropriate, Calm Orientation: : Oriented to  Time, Oriented to Situation, Oriented to Place, Oriented to Self Alcohol / Substance Use: Not Applicable Psych Involvement: No (comment)  Admission diagnosis:  CHF (congestive heart failure) (HCC) [I50.9] Acute pulmonary edema (HCC) [J81.0] Pleural effusion [J90] Closed fracture of multiple ribs, unspecified laterality, sequela [S22.49XS] Patient Active Problem List   Diagnosis Date Noted  . CHF (congestive heart failure) ([place]) 03/25/2020  . Respiratory failure ([place]) 03/25/2020  . Multiple fractures of ribs, right side, subsequent encounter for fracture with routine healing 03/06/2020  . Hyponatremia 03/05/2020  . Diarrhea 07/24/2018  . Vaginitis and vulvovaginitis 05/22/2016  . Well adult exam 10/01/2015  . Chronic chest wall pain 09/09/2014  . Mitral valve regurgitation 06/21/2012  . Essential hypertension 01/03/2008  . Osteopenia 03/28/2007  . Hypothyroidism 02/16/2007   PCP:  Ronnald Nian, DO Pharmacy:   [facility] [place] [place] [address] Phone: [phone] Fax: [phone]     Social Determinants of Health (SDOH) Interventions    Readmission Risk Interventions No flowsheet data found.

## 2020-03-26 NOTE — Progress Notes (Addendum)
Christy Wade  ZOX:[id number] DOB: [birth year] DOA: 03/25/2020 PCP: Ronnald Nian, DO    Brief Narrative:  84 year old with a history of HTN, hypothyroidism, osteoporosis, and chronic diarrhea status post intestinal resection 2016 who was hospitalized 8/18 > 8/27 following a fall with resultant right-sided rib fractures, Klebsiella UTI, and severe symptomatic hyponatremia at 109.  She was discharged from that admission in stable condition to a SNF.  She was transported from her SNF to the ED via EMS due to persistent hypoxia with sats at 88% or lower despite 3 L nasal cannula support.  In the ED the patient was noted to have a sodium of 131.  Chest x-ray noted moderate bilateral pleural effusions with bibasilar atelectasis.  CTa chest noted a moderate right pleural effusion and small left pleural effusion with right lower lobe volume loss/atelectasis and right posterior 6-11 rib fractures but without evidence of pneumothorax.  Covid PCR was negative in the ED.  Significant Events:  9/7 admit via ED  Subjective: Resting comfortably in bed at the time visit.  Denies nausea vomiting or abdominal pain.  States that she feels weak in general.  Antimicrobials:  Cefepime 9/7 >  DVT prophylaxis: Lovenox  Assessment & Plan:  Acute hypoxic respiratory failure -multifactorial etiologies Rib fractures plus atelectasis plus moderate right pleural effusion -doing well on 3 L nasal cannula support only -diuresing in attempt to correct effusion -mobilize - f/u CXR in AM   Right posterior 6-11 rib fractures status post prior fall Pain management -PT/OT  Volume overload Gently diuresing -TTE 9/7 notes preserved EF of 55-60% with no WMA but inability to comment on diastolic function and no evidence of a AoS  Hyponatremia Recently found to be severely hyponatremic at 109 and required treatment with hypertonic saline -sodium much more stable at presentation at 131 -on fluid restriction as well as a  diuretic -sodium slowly improving  HTN Blood pressure reasonably controlled at present -follow without change  Hypothyroidism Continue usual home medical therapy  Code Status: NO CODE BLUE Family Communication:  Status is: Inpatient  Remains inpatient appropriate because:Inpatient level of care appropriate due to severity of illness   Dispo: The patient is from: SNF              Anticipated d/c is to: SNF              Anticipated d/c date is: 2 days              Patient currently is not medically stable to d/c.   Consultants:  none  Objective: Blood pressure (!) 166/71, pulse 94, temperature 97.9 F (36.6 C), temperature source Oral, resp. rate 15, height 5\' 7"  (1.702 m), weight 72.6 kg, SpO2 90 %.  Intake/Output Summary (Last 24 hours) at 03/26/2020 0931 Last data filed at 03/26/2020 0824 Gross per 24 hour  Intake --  Output 650 ml  Net -650 ml   Filed Weights   03/25/20 0242  Weight: 72.6 kg    Examination: General: No acute respiratory distress Lungs: Poor air movement in bilateral bases with no wheezing Cardiovascular: Regular rate and rhythm without murmur gallop or rub normal S1 and S2 Abdomen: Nontender, nondistended, soft, bowel sounds positive, no rebound, no ascites, no appreciable mass Extremities: No significant cyanosis, clubbing, or edema bilateral lower extremities  CBC: Recent Labs  Lab 03/25/20 0415 03/25/20 0415 03/25/20 0418 03/25/20 1058 03/26/20 0502  WBC 6.0  --   --  6.0 6.2  NEUTROABS 4.5  --   --   --   --  HGB 10.4*   < > 10.9* 10.4* 10.1*  HCT 31.4*   < > 32.0* 30.5* 31.5*  MCV 92.6  --   --  92.4 93.8  PLT 326  --   --  309 303   < > = values in this interval not displayed.   Basic Metabolic Panel: Recent Labs  Lab 03/25/20 0418 03/25/20 1058 03/26/20 0502  NA 131*  --  134*  K 4.8  --  3.9  CL 102  --  101  CO2  --   --  21*  GLUCOSE 108*  --  105*  BUN 9  --  13  CREATININE 0.70 0.75 0.84  CALCIUM  --   --  8.5*    GFR: Estimated Creatinine Clearance: 51.1 mL/min (by C-G formula based on SCr of 0.84 mg/dL).  Liver Function Tests: No results for input(s): AST, ALT, ALKPHOS, BILITOT, PROT, ALBUMIN in the last 168 hours. No results for input(s): LIPASE, AMYLASE in the last 168 hours. No results for input(s): AMMONIA in the last 168 hours.  Coagulation Profile: No results for input(s): INR, PROTIME in the last 168 hours.  Cardiac Enzymes: No results for input(s): CKTOTAL, CKMB, CKMBINDEX, TROPONINI in the last 168 hours.  HbA1C: No results found for: HGBA1C  CBG: No results for input(s): GLUCAP in the last 168 hours.  Recent Results (from the past 240 hour(s))  Blood culture (routine x 2)     Status: None (Preliminary result)   Collection Time: 03/25/20  6:27 AM   Specimen: BLOOD  Result Value Ref Range Status   Specimen Description   Final    BLOOD RIGHT ARM Performed at [facility] [address] [place], [place] [address]    Special Requests   Final    BOTTLES DRAWN AEROBIC AND ANAEROBIC Blood Culture adequate volume Performed at [facility] [address], [place], [place] [address]    Culture   Final    NO GROWTH 1 DAY Performed at [facility], [place] [address], [place], [place] [address]    Report Status PENDING  Incomplete  SARS Coronavirus 2 by RT PCR (hospital order, performed in [place] hospital lab)     Status: None   Collection Time: 03/25/20  6:28 AM  Result Value Ref Range Status   SARS Coronavirus 2 NEGATIVE NEGATIVE Final    Comment: (NOTE) SARS-CoV-2 target nucleic acids are NOT DETECTED.  The SARS-CoV-2 RNA is generally detectable in upper and lower respiratory specimens during the acute phase of infection. The lowest concentration of SARS-CoV-2 viral copies this assay can detect is 250 copies / mL. A negative result does not preclude SARS-CoV-2 infection and should not be used as the sole basis for treatment  or other patient management decisions.  A negative result may occur with improper specimen collection / handling, submission of specimen other than nasopharyngeal swab, presence of viral mutation(s) within the areas targeted by this assay, and inadequate number of viral copies (<250 copies / mL). A negative result must be combined with clinical observations, patient history, and epidemiological information.  Fact Sheet for Patients:   StrictlyIdeas.no  Fact Sheet for Healthcare Providers: BankingDealers.co.za  This test is not yet approved or  cleared by the [place] FDA and has been authorized for detection and/or diagnosis of SARS-CoV-2 by FDA under an Emergency Use Authorization (EUA).  This EUA will remain in effect (meaning this test can be used) for the duration of  the COVID-19 declaration under Section 564(b)(1) of the Act, 21 U.S.C. section 360bbb-3(b)(1), unless the authorization is terminated or revoked sooner.  Performed at [facility], [place] [address], [place], [place] [address]   Blood culture (routine x 2)     Status: None (Preliminary result)   Collection Time: 03/25/20 10:58 AM   Specimen: BLOOD  Result Value Ref Range Status   Specimen Description   Final    BLOOD BLOOD LEFT FOREARM Performed at [facility] [address], [place], [place] [address]    Special Requests   Final    BOTTLES DRAWN AEROBIC AND ANAEROBIC Blood Culture adequate volume Performed at [facility] [address], [place], [place] [address]    Culture   Final    NO GROWTH < 24 HOURS Performed at [facility] [address], [place], [place] [address]    Report Status PENDING  Incomplete  MRSA PCR Screening     Status: None   Collection Time: 03/26/20  2:31 AM   Specimen: Nasal Mucosa; Nasopharyngeal  Result Value Ref Range Status   MRSA by PCR NEGATIVE NEGATIVE Final     Comment:        The GeneXpert MRSA Assay (FDA approved for NASAL specimens only), is one component of a comprehensive MRSA colonization surveillance program. It is not intended to diagnose MRSA infection nor to guide or monitor treatment for MRSA infections. Performed at [facility], [place] [address], [place], [place] [address]      Scheduled Meds: . acetaminophen  1,000 mg Oral Q8H  . amLODipine  10 mg Oral Daily  . Chlorhexidine Gluconate Cloth  6 each Topical Daily  . cycloSPORINE  1 drop Both Eyes BID  . enoxaparin (LOVENOX) injection  40 mg Subcutaneous Q24H  . hydrALAZINE  25 mg Oral Q8H  . levothyroxine  50 mcg Oral Q0600  . potassium chloride SA  40 mEq Oral BID  . sodium chloride flush  3 mL Intravenous Q12H  . sodium chloride  2 g Oral BID WC     LOS: 1 day   Cherene Altes, MD [facility] Office  [phone] Pager - Text Page per Shea Evans  If 7PM-7AM, please contact night-coverage per Amion 03/26/2020, 9:31 AM

## 2020-03-26 NOTE — Progress Notes (Addendum)
   03/26/20 0217  Vitals  Temp 98 F (36.7 C)  Temp Source Oral  BP (!) 143/81  MAP (mmHg) 101  BP Location Left Arm  BP Method Automatic  Patient Position (if appropriate) Sitting  Pulse Rate 89  Pulse Rate Source Monitor  Resp 16  MEWS COLOR  MEWS Score Color Green  Oxygen Therapy  SpO2 91 %  O2 Device Nasal Cannula  O2 Flow Rate (L/min) 3 L/min  MEWS Score  MEWS Temp 0  MEWS Systolic 0  MEWS Pulse 0  MEWS RR 0  MEWS LOC 0  MEWS Score 0   The patient is admitted to [address] with the diagnosis of respiratory failure . A & O x 4. Denied any acte pain. Patient oriented to her call bell/ascom and staff. Full assessment to epic completed. Will continue to moniotr.

## 2020-03-26 NOTE — NC FL2 (Addendum)
[place] LEVEL OF CARE SCREENING TOOL     IDENTIFICATION  Patient Name: Christy Wade Birthdate: [birth year] Sex: female Admission Date (Current Location): 03/25/2020  [facility] and [place] Number:  Herbalist and Address:  [facility],  [place], [place]      Provider Number: [id number]  Attending Physician Name and Address:  Cherene Altes, MD  Relative Name and Phone Number:  [facility] Daughter   [phone] or Audry Pili [phone]    Current Level of Care: Hospital Recommended Level of Care: [place] Prior Approval Number:    Date Approved/Denied:   PASRR Number: [id number]  Discharge Plan: SNF    Current Diagnoses: Patient Active Problem List   Diagnosis Date Noted  . CHF (congestive heart failure) ([place]) 03/25/2020  . Respiratory failure ([place]) 03/25/2020  . Multiple fractures of ribs, right side, subsequent encounter for fracture with routine healing 03/06/2020  . Hyponatremia 03/05/2020  . Diarrhea 07/24/2018  . Vaginitis and vulvovaginitis 05/22/2016  . Well adult exam 10/01/2015  . Chronic chest wall pain 09/09/2014  . Mitral valve regurgitation 06/21/2012  . Essential hypertension 01/03/2008  . Osteopenia 03/28/2007  . Hypothyroidism 02/16/2007    Orientation RESPIRATION BLADDER Height & Weight     Self, Time, Place, Situation  O2 (3L) Incontinent Weight: 160 lb (72.6 kg) Height:  5\' 7"  (170.2 cm)  BEHAVIORAL SYMPTOMS/MOOD NEUROLOGICAL BOWEL NUTRITION STATUS      Continent Diet  AMBULATORY STATUS COMMUNICATION OF NEEDS Skin   Limited Assist Verbally Surgical wounds                       Personal Care Assistance Level of Assistance  Bathing, Feeding, Dressing Bathing Assistance: Limited assistance Feeding assistance: Independent Dressing Assistance: Limited assistance     Functional Limitations Info  Sight, Hearing, Speech Sight Info:  Adequate Hearing Info: Adequate Speech Info: Adequate    SPECIAL CARE FACTORS FREQUENCY  PT (By licensed PT)     PT Frequency: [place] 5x a weekl OT Frequency: Mm 5x a week            Contractures Contractures Info: Not present    Additional Factors Info  Code Status, Allergies Code Status Info: DNR Allergies Info: Codeine Sulfamethoxazole           Current Medications (03/26/2020):  This is the current hospital active medication list Current Facility-Administered Medications  Medication Dose Route Frequency Provider Last Rate Last Admin  . acetaminophen (TYLENOL) tablet 1,000 mg  1,000 mg Oral Q8H Harold Hedge, MD   1,000 mg at 03/26/20 0540  . amLODipine (NORVASC) tablet 10 mg  10 mg Oral Daily Harold Hedge, MD   10 mg at 03/26/20 0925  . cycloSPORINE (RESTASIS) 0.05 % ophthalmic emulsion 1 drop  1 drop Both Eyes BID Harold Hedge, MD   1 drop at 03/25/20 2204  . enoxaparin (LOVENOX) injection 40 mg  40 mg Subcutaneous Q24H Harold Hedge, MD   40 mg at 03/26/20 3016  . hydrALAZINE (APRESOLINE) tablet 25 mg  25 mg Oral Q8H Harold Hedge, MD   25 mg at 03/26/20 0541  . hydrOXYzine (ATARAX/VISTARIL) tablet 10 mg  10 mg Oral TID PRN Harold Hedge, MD   10 mg at 03/25/20 2203  . levothyroxine (SYNTHROID) tablet 50 mcg  50 mcg Oral Q0600 Harold Hedge, MD   50 mcg at 03/26/20 0541  . lip balm (  CARMEX) ointment 1 application  1 application Topical PRN Harold Hedge, MD      . metoprolol tartrate (LOPRESSOR) injection 5 mg  5 mg Intravenous Q6H PRN Harold Hedge, MD      . ondansetron (ZOFRAN-ODT) disintegrating tablet 4 mg  4 mg Oral Daily PRN Harold Hedge, MD      . oxyCODONE (Oxy IR/ROXICODONE) immediate release tablet 5 mg  5 mg Oral Q4H PRN Harold Hedge, MD   5 mg at 03/25/20 1622  . polyethylene glycol (MIRALAX / GLYCOLAX) packet 17 g  17 g Oral Daily PRN Harold Hedge, MD      . polyvinyl alcohol (LIQUIFILM TEARS) 1.4 % ophthalmic solution 1 drop  1 drop Both  Eyes PRN Harold Hedge, MD      . potassium chloride SA (KLOR-CON) CR tablet 40 mEq  40 mEq Oral BID Harold Hedge, MD   40 mEq at 03/26/20 0926  . sodium chloride flush (NS) 0.9 % injection 3 mL  3 mL Intravenous Q12H Harold Hedge, MD   3 mL at 03/26/20 0926  . sodium chloride tablet 2 g  2 g Oral BID WC Harold Hedge, MD   2 g at 03/26/20 0925  . traMADol (ULTRAM) tablet 50 mg  50 mg Oral Q8H PRN Harold Hedge, MD   50 mg at 03/25/20 2203     Discharge Medications: Please see discharge summary for a list of discharge medications.  Relevant Imaging Results:  Relevant Lab Results:   Additional Information SSN [id number]  Ross Ludwig, LCSW

## 2020-03-27 ENCOUNTER — Inpatient Hospital Stay (HOSPITAL_COMMUNITY): Payer: Medicare PPO

## 2020-03-27 LAB — COMPREHENSIVE METABOLIC PANEL
ALT: 47 U/L — ABNORMAL HIGH (ref 0–44)
AST: 57 U/L — ABNORMAL HIGH (ref 15–41)
Albumin: 3.3 g/dL — ABNORMAL LOW (ref 3.5–5.0)
Alkaline Phosphatase: 99 U/L (ref 38–126)
Anion gap: 9 (ref 5–15)
BUN: 13 mg/dL (ref 8–23)
CO2: 21 mmol/L — ABNORMAL LOW (ref 22–32)
Calcium: 8.3 mg/dL — ABNORMAL LOW (ref 8.9–10.3)
Chloride: 100 mmol/L (ref 98–111)
Creatinine, Ser: 0.73 mg/dL (ref 0.44–1.00)
GFR calc Af Amer: >60 mL/min (ref >60)
GFR calc non Af Amer: >60 mL/min (ref >60)
Glucose, Bld: 101 mg/dL — ABNORMAL HIGH (ref 70–99)
Potassium: 3.7 mmol/L (ref 3.5–5.1)
Sodium: 130 mmol/L — ABNORMAL LOW (ref 135–145)
Total Bilirubin: 1 mg/dL (ref 0.3–1.2)
Total Protein: 6.5 g/dL (ref 6.5–8.1)

## 2020-03-27 LAB — CBC
HCT: 33.1 % — ABNORMAL LOW (ref 36.0–46.0)
Hemoglobin: 10.7 g/dL — ABNORMAL LOW (ref 12.0–15.0)
MCH: 30.6 pg (ref 26.0–34.0)
MCHC: 32.3 g/dL (ref 30.0–36.0)
MCV: 94.6 fL (ref 80.0–100.0)
Platelets: 341 10*3/uL (ref 150–400)
RBC: 3.5 MIL/uL — ABNORMAL LOW (ref 3.87–5.11)
RDW: 17 % — ABNORMAL HIGH (ref 11.5–15.5)
WBC: 6.5 10*3/uL (ref 4.0–10.5)
nRBC: 0 % (ref 0.0–0.2)

## 2020-03-27 LAB — MAGNESIUM: Magnesium: 2 mg/dL (ref 1.7–2.4)

## 2020-03-27 MED ORDER — LIDOCAINE VISCOUS HCL 2 % MT SOLN
15.0000 mL | OROMUCOSAL | Status: DC | PRN
  Administered 2020-03-27: 15 mL via OROMUCOSAL
  Filled 2020-03-27: qty 15

## 2020-03-27 MED ORDER — ACETAMINOPHEN 325 MG PO TABS: 650.0000 mg | ORAL_TABLET | Freq: Four times a day (QID) | ORAL | Status: DC | PRN

## 2020-03-27 MED ORDER — FUROSEMIDE 10 MG/ML IJ SOLN
40.0000 mg | Freq: Two times a day (BID) | INTRAMUSCULAR | Status: DC
  Administered 2020-03-27 – 2020-03-28 (×3): 40 mg via INTRAVENOUS
  Filled 2020-03-27 (×3): qty 4

## 2020-03-27 NOTE — Plan of Care (Signed)
  Problem: Education: Goal: Knowledge of General Education information will improve Description Including pain rating scale, medication(s)/side effects and non-pharmacologic comfort measures Outcome: Progressing   

## 2020-03-27 NOTE — Progress Notes (Signed)
Christy Wade  ZYS:[id number] DOB: 1936/02/23 DOA: 03/25/2020 PCP: Ronnald Nian, DO    Brief Narrative:  84yo with a history of HTN, hypothyroidism, osteoporosis, and chronic diarrhea status post intestinal resection 2016 who was hospitalized 8/18 > 8/27 following a fall with resultant right-sided rib fractures, Klebsiella UTI, and severe symptomatic hyponatremia at 109.  She was discharged from that admission in stable condition to a SNF.  She was transported from her SNF to the ED via EMS due to persistent hypoxia with sats at 88% or lower despite 3 L nasal cannula support.  In the ED the patient was noted to have a sodium of 131.  Chest x-ray noted moderate bilateral pleural effusions with bibasilar atelectasis.  CTa chest noted a moderate right pleural effusion and small left pleural effusion with right lower lobe volume loss/atelectasis and right posterior 6-11 rib fractures but without evidence of pneumothorax.  Covid PCR was negative in the ED.  Significant Events:  8/1/ > 8/27 hospitalized s/p fall w/ rib fx, Kleb UTI, hyponatremia  9/7 admit via ED  Subjective: Afebrile. Saturations dipping into mid-80s at times. Na declining. Persistent B pleural effusions (R >L) noted on f/u CXR.   Antimicrobials:  Cefepime 9/7 >  DVT prophylaxis: Lovenox  Assessment & Plan:  Acute hypoxic respiratory failure - multifactorial etiologies Rib fractures plus atelectasis plus moderate right pleural effusion - diuresing in attempt to correct effusion -mobilize - f/u CXR notes no signif change today   Right posterior 6-11 rib fractures status post prior fall Pain management - PT/OT   Separated right shoulder Due to fall at time of prior admit - previously in sling - under care of Dr. Griffin Basil  Volume overload Gently diuresing -TTE 9/7 notes preserved EF of 55-60% with no WMA but inability to comment on diastolic function and no evidence of a AoS - follow Is/Os   Recent Acute urinary  retention  Was d/c from hospital previously w/ an indwelling foley cath for outpt Urology f/u - retention has recurred this afternoon - follow protocol w/ I/O for now, but low threshold to replace foley as needed   Hyponatremia - acute on chronic  Recently found to be severely hyponatremic at 109 and required treatment with hypertonic saline - Neph felt this was due to HCTZ, diarrhea, hypovolemia, and SIADH during prior admit - sodium much more stable at presentation at 131 -on fluid restriction as well as a diuretic - cont to follow daily   Mild transaminitis  Monitor trend   HTN Blood pressure reasonably controlled at present -follow without change  Hypothyroidism Continue usual home medical therapy  Code Status: NO CODE BLUE Family Communication:  Status is: Inpatient  Remains inpatient appropriate because:Inpatient level of care appropriate due to severity of illness   Dispo: The patient is from: SNF              Anticipated d/c is to: SNF              Anticipated d/c date is: 2 days              Patient currently is not medically stable to d/c.   Consultants:  none  Objective: Blood pressure (!) 159/83, pulse 94, temperature (!) 97.5 F (36.4 C), temperature source Oral, resp. rate 16, height 5\' 7"  (1.702 m), weight 72.6 kg, SpO2 (!) 85 %.  Intake/Output Summary (Last 24 hours) at 03/27/2020 0914 Last data filed at 03/27/2020 0501 Gross per 24 hour  Intake 300 ml  Output 650 ml  Net -350 ml   Filed Weights   03/25/20 0242  Weight: 72.6 kg    Examination: General: No acute respiratory distress -alert and conversant Lungs: Poor air movement in both bases with no wheezing Cardiovascular: RRR without murmur or rub Abdomen: NT/ND, soft, BS positive, no rebound Extremities: 2+ bilateral lower extremity edema without cyanosis  CBC: Recent Labs  Lab 03/25/20 0415 03/25/20 0418 03/25/20 1058 03/26/20 0502 03/27/20 0522  WBC 6.0   < > 6.0 6.2 6.5  NEUTROABS 4.5   --   --   --   --   HGB 10.4*   < > 10.4* 10.1* 10.7*  HCT 31.4*   < > 30.5* 31.5* 33.1*  MCV 92.6   < > 92.4 93.8 94.6  PLT 326   < > 309 303 341   < > = values in this interval not displayed.   Basic Metabolic Panel: Recent Labs  Lab 03/25/20 0418 03/25/20 0418 03/25/20 1058 03/26/20 0502 03/27/20 0522  NA 131*  --   --  134* 130*  K 4.8  --   --  3.9 3.7  CL 102  --   --  101 100  CO2  --   --   --  21* 21*  GLUCOSE 108*  --   --  105* 101*  BUN 9  --   --  13 13  CREATININE 0.70   < > 0.75 0.84 0.73  CALCIUM  --   --   --  8.5* 8.3*  MG  --   --   --   --  2.0   < > = values in this interval not displayed.   GFR: Estimated Creatinine Clearance: 53.6 mL/min (by C-G formula based on SCr of 0.73 mg/dL).  Liver Function Tests: Recent Labs  Lab 03/27/20 0522  AST 57*  ALT 47*  ALKPHOS 99  BILITOT 1.0  PROT 6.5  ALBUMIN 3.3*    Recent Results (from the past 240 hour(s))  Blood culture (routine x 2)     Status: None (Preliminary result)   Collection Time: 03/25/20  6:27 AM   Specimen: BLOOD  Result Value Ref Range Status   Specimen Description   Final    BLOOD RIGHT ARM Performed at [facility] [address] [place], [place] [address]    Special Requests   Final    BOTTLES DRAWN AEROBIC AND ANAEROBIC Blood Culture adequate volume Performed at [facility] [address] [place], [place] [address]    Culture   Final    NO GROWTH 2 DAYS Performed at [facility] [address], [place], [place] [address]    Report Status PENDING  Incomplete  SARS Coronavirus 2 by RT PCR (hospital order, performed in [facility] hospital lab)     Status: None   Collection Time: 03/25/20  6:28 AM  Result Value Ref Range Status   SARS Coronavirus 2 NEGATIVE NEGATIVE Final    Comment: (NOTE) SARS-CoV-2 target nucleic acids are NOT DETECTED.  The SARS-CoV-2 RNA is generally detectable in upper and lower respiratory specimens  during the acute phase of infection. The lowest concentration of SARS-CoV-2 viral copies this assay can detect is 250 copies / mL. A negative result does not preclude SARS-CoV-2 infection and should not be used as the sole basis for treatment or other patient management decisions.  A negative result may occur with improper specimen  collection / handling, submission of specimen other than nasopharyngeal swab, presence of viral mutation(s) within the areas targeted by this assay, and inadequate number of viral copies (<250 copies / mL). A negative result must be combined with clinical observations, patient history, and epidemiological information.  Fact Sheet for Patients:   StrictlyIdeas.no  Fact Sheet for Healthcare Providers: BankingDealers.co.za  This test is not yet approved or  cleared by the [place] FDA and has been authorized for detection and/or diagnosis of SARS-CoV-2 by FDA under an Emergency Use Authorization (EUA).  This EUA will remain in effect (meaning this test can be used) for the duration of the COVID-19 declaration under Section 564(b)(1) of the Act, 21 U.S.C. section 360bbb-3(b)(1), unless the authorization is terminated or revoked sooner.  Performed at [facility], [place] [address] [place], [place] [address]   Blood culture (routine x 2)     Status: None (Preliminary result)   Collection Time: 03/25/20 10:58 AM   Specimen: BLOOD  Result Value Ref Range Status   Specimen Description   Final    BLOOD BLOOD LEFT FOREARM Performed at [facility] [address], [place], [place] [address]    Special Requests   Final    BOTTLES DRAWN AEROBIC AND ANAEROBIC Blood Culture adequate volume Performed at [facility] [address] [place], [place] [address]    Culture   Final    NO GROWTH 2 DAYS Performed at [facility] [address]  [place], [place] [address]    Report Status PENDING  Incomplete  MRSA PCR Screening     Status: None   Collection Time: 03/26/20  2:31 AM   Specimen: Nasal Mucosa; Nasopharyngeal  Result Value Ref Range Status   MRSA by PCR NEGATIVE NEGATIVE Final    Comment:        The GeneXpert MRSA Assay (FDA approved for NASAL specimens only), is one component of a comprehensive MRSA colonization surveillance program. It is not intended to diagnose MRSA infection nor to guide or monitor treatment for MRSA infections. Performed at [facility], [place] [address], [place], [place] [address]      Scheduled Meds: . acetaminophen  1,000 mg Oral Q8H  . amLODipine  10 mg Oral Daily  . cycloSPORINE  1 drop Both Eyes BID  . enoxaparin (LOVENOX) injection  40 mg Subcutaneous Q24H  . hydrALAZINE  25 mg Oral Q8H  . levothyroxine  50 mcg Oral Q0600  . potassium chloride SA  40 mEq Oral Daily  . sodium chloride flush  3 mL Intravenous Q12H  . sodium chloride  2 g Oral BID WC     LOS: 2 days   Cherene Altes, MD [facility] Office  [phone] Pager - Text Page per Shea Evans  If 7PM-7AM, please contact night-coverage per Amion 03/27/2020, 9:14 AM

## 2020-03-27 NOTE — Progress Notes (Signed)
Physical Therapy Treatment Patient Details Name: Christy Wade MRN: [id number] DOB: Apr 19, 1936 Today's Date: 03/27/2020    History of Present Illness 84 yo female admitted with acute respiratory failure. Recent hx of R AC shoulder separation, R rib fxs.    PT Comments    Pt declined OOB today however agreeable to LE exercises.  Pt performed exercises with rest breaks and breathing.  SPO2 dropped to 85% on 3L O2 [place] during exercises.  Spo2 93% on 3L O2 at rest.  Continue to recommend SNF upon d/c.    Follow Up Recommendations  SNF     Equipment Recommendations  None recommended by PT    Recommendations for Other Services       Precautions / Restrictions Precautions Precautions: Fall Precaution Comments: R shoulder sling 2 weeks ago? for [employer] separation, R rib fx Restrictions RUE Weight Bearing: Weight bearing as tolerated    Mobility  Bed Mobility               General bed mobility comments: pt declined OOB today, too weak due to not eating since her tongue bleeding and sores in her mouth (MD aware) - MD into room during session  Transfers                    Ambulation/Gait                 Stairs             Wheelchair Mobility    Modified Rankin (Stroke Patients Only)       Balance                                            Cognition Arousal/Alertness: Awake/alert Behavior During Therapy: WFL for tasks assessed/performed Overall Cognitive Status: Within Functional Limits for tasks assessed                                 General Comments: makes repetitive statements      Exercises General Exercises - Lower Extremity Ankle Circles/Pumps: AROM;Both;10 reps;Supine Quad Sets: AROM;Both;10 reps Short Arc Quad: AROM;Both;10 reps Heel Slides: AAROM;Both;10 reps Hip ABduction/ADduction: AROM;Both;10 reps    General Comments        Pertinent Vitals/Pain Pain Assessment: No/denies pain     Home Living                      Prior Function            PT Goals (current goals can now be found in the care plan section) Progress towards PT goals: Progressing toward goals    Frequency    Min 2X/week      PT Plan Current plan remains appropriate    Co-evaluation              AM-PAC PT "6 Clicks" Mobility   Outcome Measure  Help needed turning from your back to your side while in a flat bed without using bedrails?: A Lot Help needed moving from lying on your back to sitting on the side of a flat bed without using bedrails?: A Lot Help needed moving to and from a bed to a chair (including a wheelchair)?: A Lot Help needed standing up from a chair using your arms (e.g., wheelchair or bedside  chair)?: A Lot Help needed to walk in hospital room?: A Lot Help needed climbing 3-5 steps with a railing? : Total 6 Click Score: 11    End of Session Equipment Utilized During Treatment: Oxygen Activity Tolerance: Patient tolerated treatment well Patient left: in bed;with call bell/phone within reach;with bed alarm set Nurse Communication: Mobility status PT Visit Diagnosis: Muscle weakness (generalized) (M62.81)     Time: 4944-7395 PT Time Calculation (min) (ACUTE ONLY): 23 min  Charges:  $Therapeutic Exercise: 8-22 mins                     Christy Wade, DPT Acute Rehabilitation Services Pager: [phone] Office: [phone]    Christy Wade E 03/27/2020, 4:29 PM

## 2020-03-27 NOTE — Progress Notes (Signed)
Pt had Lasix earlier in shift, voided total of 200 cc, notable swelling in lower legs with 2 + pitting, pt had inct episode, pt c/o of pressure in lower abd, bladder scanned 386 ml noted then rechecked 566 ml noted. MD made aware will I/O cath per nursing protocol. SRP, RN

## 2020-03-28 DIAGNOSIS — S2249XS Multiple fractures of ribs, unspecified side, sequela: Secondary | ICD-10-CM | POA: Diagnosis not present

## 2020-03-28 DIAGNOSIS — K219 Gastro-esophageal reflux disease without esophagitis: Secondary | ICD-10-CM | POA: Diagnosis not present

## 2020-03-28 DIAGNOSIS — I5033 Acute on chronic diastolic (congestive) heart failure: Secondary | ICD-10-CM

## 2020-03-28 DIAGNOSIS — Z9181 History of falling: Secondary | ICD-10-CM | POA: Diagnosis not present

## 2020-03-28 DIAGNOSIS — R2681 Unsteadiness on feet: Secondary | ICD-10-CM | POA: Diagnosis not present

## 2020-03-28 DIAGNOSIS — R0789 Other chest pain: Secondary | ICD-10-CM | POA: Diagnosis not present

## 2020-03-28 DIAGNOSIS — E876 Hypokalemia: Secondary | ICD-10-CM | POA: Diagnosis not present

## 2020-03-28 DIAGNOSIS — S2241XA Multiple fractures of ribs, right side, initial encounter for closed fracture: Secondary | ICD-10-CM | POA: Diagnosis not present

## 2020-03-28 DIAGNOSIS — R001 Bradycardia, unspecified: Secondary | ICD-10-CM | POA: Diagnosis not present

## 2020-03-28 DIAGNOSIS — J069 Acute upper respiratory infection, unspecified: Secondary | ICD-10-CM | POA: Diagnosis not present

## 2020-03-28 DIAGNOSIS — J9600 Acute respiratory failure, unspecified whether with hypoxia or hypercapnia: Secondary | ICD-10-CM | POA: Diagnosis not present

## 2020-03-28 DIAGNOSIS — M2550 Pain in unspecified joint: Secondary | ICD-10-CM | POA: Diagnosis not present

## 2020-03-28 DIAGNOSIS — I10 Essential (primary) hypertension: Secondary | ICD-10-CM | POA: Diagnosis not present

## 2020-03-28 DIAGNOSIS — E039 Hypothyroidism, unspecified: Secondary | ICD-10-CM | POA: Diagnosis not present

## 2020-03-28 DIAGNOSIS — Z66 Do not resuscitate: Secondary | ICD-10-CM | POA: Diagnosis not present

## 2020-03-28 DIAGNOSIS — M6281 Muscle weakness (generalized): Secondary | ICD-10-CM | POA: Diagnosis not present

## 2020-03-28 DIAGNOSIS — R0689 Other abnormalities of breathing: Secondary | ICD-10-CM | POA: Diagnosis not present

## 2020-03-28 DIAGNOSIS — Z79899 Other long term (current) drug therapy: Secondary | ICD-10-CM | POA: Diagnosis not present

## 2020-03-28 DIAGNOSIS — R2689 Other abnormalities of gait and mobility: Secondary | ICD-10-CM | POA: Diagnosis not present

## 2020-03-28 DIAGNOSIS — I110 Hypertensive heart disease with heart failure: Secondary | ICD-10-CM | POA: Diagnosis not present

## 2020-03-28 DIAGNOSIS — E8770 Fluid overload, unspecified: Secondary | ICD-10-CM | POA: Diagnosis not present

## 2020-03-28 DIAGNOSIS — J90 Pleural effusion, not elsewhere classified: Secondary | ICD-10-CM | POA: Diagnosis not present

## 2020-03-28 DIAGNOSIS — R1312 Dysphagia, oropharyngeal phase: Secondary | ICD-10-CM | POA: Diagnosis not present

## 2020-03-28 DIAGNOSIS — J810 Acute pulmonary edema: Secondary | ICD-10-CM | POA: Diagnosis not present

## 2020-03-28 DIAGNOSIS — R0602 Shortness of breath: Secondary | ICD-10-CM | POA: Diagnosis present

## 2020-03-28 DIAGNOSIS — R197 Diarrhea, unspecified: Secondary | ICD-10-CM | POA: Diagnosis not present

## 2020-03-28 DIAGNOSIS — J9811 Atelectasis: Secondary | ICD-10-CM | POA: Diagnosis not present

## 2020-03-28 DIAGNOSIS — J9601 Acute respiratory failure with hypoxia: Secondary | ICD-10-CM | POA: Diagnosis not present

## 2020-03-28 DIAGNOSIS — E871 Hypo-osmolality and hyponatremia: Secondary | ICD-10-CM | POA: Diagnosis not present

## 2020-03-28 DIAGNOSIS — S43101A Unspecified dislocation of right acromioclavicular joint, initial encounter: Secondary | ICD-10-CM | POA: Diagnosis not present

## 2020-03-28 DIAGNOSIS — S2241XD Multiple fractures of ribs, right side, subsequent encounter for fracture with routine healing: Secondary | ICD-10-CM | POA: Diagnosis not present

## 2020-03-28 DIAGNOSIS — Z7401 Bed confinement status: Secondary | ICD-10-CM | POA: Diagnosis not present

## 2020-03-28 DIAGNOSIS — I509 Heart failure, unspecified: Secondary | ICD-10-CM | POA: Diagnosis not present

## 2020-03-28 DIAGNOSIS — M810 Age-related osteoporosis without current pathological fracture: Secondary | ICD-10-CM | POA: Diagnosis not present

## 2020-03-28 DIAGNOSIS — E119 Type 2 diabetes mellitus without complications: Secondary | ICD-10-CM | POA: Diagnosis not present

## 2020-03-28 DIAGNOSIS — R41841 Cognitive communication deficit: Secondary | ICD-10-CM | POA: Diagnosis not present

## 2020-03-28 DIAGNOSIS — Z79890 Hormone replacement therapy (postmenopausal): Secondary | ICD-10-CM | POA: Diagnosis not present

## 2020-03-28 DIAGNOSIS — S43004D Unspecified dislocation of right shoulder joint, subsequent encounter: Secondary | ICD-10-CM | POA: Diagnosis not present

## 2020-03-28 DIAGNOSIS — R531 Weakness: Secondary | ICD-10-CM | POA: Diagnosis not present

## 2020-03-28 DIAGNOSIS — Z20822 Contact with and (suspected) exposure to covid-19: Secondary | ICD-10-CM | POA: Diagnosis not present

## 2020-03-28 DIAGNOSIS — F411 Generalized anxiety disorder: Secondary | ICD-10-CM | POA: Diagnosis not present

## 2020-03-28 DIAGNOSIS — S2231XA Fracture of one rib, right side, initial encounter for closed fracture: Secondary | ICD-10-CM | POA: Diagnosis not present

## 2020-03-28 LAB — BASIC METABOLIC PANEL
Anion gap: 10 (ref 5–15)
BUN: 15 mg/dL (ref 8–23)
CO2: 25 mmol/L (ref 22–32)
Calcium: 8.2 mg/dL — ABNORMAL LOW (ref 8.9–10.3)
Chloride: 97 mmol/L — ABNORMAL LOW (ref 98–111)
Creatinine, Ser: 0.88 mg/dL (ref 0.44–1.00)
GFR calc Af Amer: >60 mL/min (ref >60)
GFR calc non Af Amer: >60 mL/min (ref >60)
Glucose, Bld: 87 mg/dL (ref 70–99)
Potassium: 3.3 mmol/L — ABNORMAL LOW (ref 3.5–5.1)
Sodium: 132 mmol/L — ABNORMAL LOW (ref 135–145)

## 2020-03-28 LAB — MAGNESIUM: Magnesium: 2 mg/dL (ref 1.7–2.4)

## 2020-03-28 LAB — SARS CORONAVIRUS 2 BY RT PCR (HOSPITAL ORDER, PERFORMED IN ~~LOC~~ HOSPITAL LAB): SARS Coronavirus 2: NEGATIVE

## 2020-03-28 MED ORDER — POLYVINYL ALCOHOL 1.4 % OP SOLN: 1.0000 [drp] | OPHTHALMIC | 0 refills | Status: DC | PRN

## 2020-03-28 MED ORDER — FUROSEMIDE 40 MG PO TABS: 40.0000 mg | ORAL_TABLET | Freq: Every day | ORAL | Status: DC

## 2020-03-28 MED ORDER — FUROSEMIDE 40 MG PO TABS: 40.0000 mg | ORAL_TABLET | Freq: Two times a day (BID) | ORAL | Status: DC

## 2020-03-28 MED ORDER — METOPROLOL TARTRATE 25 MG PO TABS: 12.5000 mg | ORAL_TABLET | Freq: Two times a day (BID) | ORAL | Status: DC

## 2020-03-28 MED ORDER — POTASSIUM CHLORIDE CRYS ER 20 MEQ PO TBCR: 40.0000 meq | EXTENDED_RELEASE_TABLET | Freq: Every day | ORAL | Status: DC

## 2020-03-28 MED ORDER — LIDOCAINE VISCOUS HCL 2 % MT SOLN: 15.0000 mL | OROMUCOSAL | 0 refills | Status: DC | PRN

## 2020-03-28 MED ORDER — POTASSIUM CHLORIDE CRYS ER 20 MEQ PO TBCR
40.0000 meq | EXTENDED_RELEASE_TABLET | Freq: Two times a day (BID) | ORAL | Status: DC
  Administered 2020-03-28: 40 meq via ORAL
  Filled 2020-03-28: qty 2

## 2020-03-28 MED ORDER — POLYETHYLENE GLYCOL 3350 17 G PO PACK: 17.0000 g | PACK | Freq: Every day | ORAL | 0 refills | Status: DC | PRN

## 2020-03-28 MED ORDER — ACETAMINOPHEN 325 MG PO TABS: 650.0000 mg | ORAL_TABLET | Freq: Four times a day (QID) | ORAL | Status: DC | PRN

## 2020-03-28 MED ORDER — METOPROLOL TARTRATE 25 MG PO TABS
12.5000 mg | ORAL_TABLET | Freq: Two times a day (BID) | ORAL | Status: DC
  Administered 2020-03-28: 12.5 mg via ORAL
  Filled 2020-03-28: qty 1

## 2020-03-28 MED ORDER — POTASSIUM CHLORIDE CRYS ER 20 MEQ PO TBCR: 40.0000 meq | EXTENDED_RELEASE_TABLET | Freq: Two times a day (BID) | ORAL | 0 refills | Status: DC

## 2020-03-28 MED ORDER — METOPROLOL TARTRATE 25 MG PO TABS: 25.0000 mg | ORAL_TABLET | Freq: Two times a day (BID) | ORAL | Status: DC

## 2020-03-28 NOTE — TOC Progression Note (Signed)
Transition of Care [facility]) - Progression Note    Patient Details  Name: Christy Wade MRN: [id number] Date of Birth: 1936-06-09  [facility] CM/SW Contact  Purcell Mouton, RN Phone Number: 03/28/2020, 11:33 AM  Clinical Narrative:    Pt's daughter Christy Wade was called to make her aware that pt would discharge to [facility] today.    Expected Discharge Plan: [place] Barriers to Discharge: Continued Medical Work up  Expected Discharge Plan and Services Expected Discharge Plan: Chance arrangements for the past 2 months: [place], Single Family Home Expected Discharge Date:  (unknown)                                     Social Determinants of Health (SDOH) Interventions    Readmission Risk Interventions No flowsheet data found.

## 2020-03-28 NOTE — Progress Notes (Signed)
Pt had 11 beats svt/WIDE qrs at 10;30 am, pt resting in bed, 02 94% 3l. Asymptomatic. Will cont to monitor. MD updated. SRP, RN

## 2020-03-28 NOTE — Discharge Summary (Addendum)
DISCHARGE SUMMARY  Christy Wade  MR#: [id number]  DOB:[birth year]  Date of Admission: 03/25/2020 Date of Discharge: 03/28/2020  Attending Physician:Kaci Dillie Hennie Duos, MD  Patient's YFV:[id number], Garvin Fila, DO  Consults: none  Disposition: D/C to SNF   Follow-up Appts:  Follow-up Information    Ronnald Nian, DO Follow up in 5 day(s).   Specialty: Family Medicine Contact information: [place] [address] [phone]               Tests Needing Follow-up: -recheck LFTs in 5-7 days -follow K+ and Na+ intermittently until proven stable  -follow volume status - will require titration of diuretic dose  -wean O2 support as able w/ ongoing diuresis  -mobilize w/ PT/OT  -monitor for recurrent urinary retention   Discharge Diagnoses: Acute hypoxic respiratory failure - multifactorial etiologies Right posterior 6-11 rib fractures status post prior fall Separated right shoulder Volume overload Recent Acute urinary retention  Hyponatremia - acute on chronic  Mild transaminitis  HTN Hypothyroidism Hypokalemia NO CODE BLUE - DNR   Initial presentation: 84yo with a history of HTN, hypothyroidism, osteoporosis, and chronic diarrhea status post intestinal resection 2016 who was hospitalized 8/18 > 8/27 following a fall with resultant right-sided rib fractures, Klebsiella UTI, and severe symptomatic hyponatremia at 109.  She was discharged from that admission in stable condition to a SNF.  She was transported from her SNF to the ED via EMS due to persistent hypoxia with sats at 88% or lower despite 3 L nasal cannula support.  In the ED the patient was noted to have a sodium of 131.  Chest x-ray noted moderate bilateral pleural effusions with bibasilar atelectasis.  CTa chest noted a moderate right pleural effusion and small left pleural effusion with right lower lobe volume loss/atelectasis and right posterior 6-11 rib fractures but without  evidence of pneumothorax.  Covid PCR was negative in the ED.  Hospital Course:  Acute hypoxic respiratory failure - multifactorial etiologies Rib fractures plus atelectasis plus moderate right pleural effusion -oxygen requirements improved with diuresis -clinically stabilized - cont diuretic for effusion management - effusion likely simply related to recent chest trauma / fractured ribs   Right posterior 6-11 rib fractures status post prior fall Pain management - cont PT/OT - stable from this standpoint   Separated right shoulder Due to fall at time of prior admit - previously in sling - under care of Dr. Griffin Basil - f/u as outpt will be indicated   Volume overload - possible diastolic CHF  Gently diuresing - net negative approximately 600 cc since admission - TTE 9/7 notes preserved EF of 55-60% with no WMA but inability to comment on diastolic function and no evidence of a AoS - has tolerated gentle diuresis well - monitor Is/Os and weights - lasix will likely need to be further titrated as outpt  Intermittent recurrent Acute urinary retention (03/27/20) Was d/c from hospital previously w/ an indwelling foley cath for outpt Urology f/u - at time of this admission pt had no foley - retention recurred 03/27/20 afternoon - I/O performed w/ success 9/9 w/ no further retention during admission - follow for recurrent retention in SNF   Hyponatremia - acute on chronic  During prior admission was found to be severely hyponatremic at 109 and required treatment with hypertonic saline - Neph felt this was due to HCTZ, diarrhea, hypovolemia, and SIADH during prior admit - sodium much more stable at presentation at 131 -on fluid restriction as well as  a diuretic -sodium appears to have stabilized - has done well without sodium tablets this admit   Hypokalemia Due to use of diuretic - supplement and follow as an outpt   Mild transaminitis  Monitor trend intermittently  HTN Blood pressure reasonably  controlled at present -follow without change  Hypothyroidism Continue usual home medical therapy  Allergies as of 03/28/2020      Reactions   Codeine Nausea Only   REACTION: nausea   Sulfamethoxazole Rash   REACTION: rash      Medication List    STOP taking these medications   amoxicillin-clavulanate 875-125 MG tablet Commonly known as: AUGMENTIN   lisinopril-hydrochlorothiazide 20-25 MG tablet Commonly known as: ZESTORETIC   sodium chloride 1 g tablet     TAKE these medications   acetaminophen 325 MG tablet Commonly known as: TYLENOL Take 2 tablets (650 mg total) by mouth every 6 (six) hours as needed for mild pain, fever or headache. What changed:   medication strength  how much to take  when to take this  reasons to take this   amLODipine 10 MG tablet Commonly known as: NORVASC Take 1 tablet (10 mg total) by mouth daily.   cycloSPORINE 0.05 % ophthalmic emulsion Commonly known as: RESTASIS Place 1 drop into both eyes 2 (two) times daily.   furosemide 40 MG tablet Commonly known as: LASIX Take 1 tablet (40 mg total) by mouth 2 (two) times daily for 3 days.   furosemide 40 MG tablet Commonly known as: LASIX Take 1 tablet (40 mg total) by mouth daily. Start taking on: March 31, 2020   hydrALAZINE 25 MG tablet Commonly known as: APRESOLINE Take 1 tablet (25 mg total) by mouth every 8 (eight) hours.   hydrOXYzine 10 MG tablet Commonly known as: ATARAX/VISTARIL Take 1 tablet (10 mg total) by mouth 3 (three) times daily as needed for anxiety.   levothyroxine 50 MCG tablet Commonly known as: SYNTHROID TAKE 1 TABLET BY MOUTH EVERY MORNING   lidocaine 2 % solution Commonly known as: XYLOCAINE Use as directed 15 mLs in the mouth or throat every 4 (four) hours as needed for mouth pain.   metoprolol tartrate 25 MG tablet Commonly known as: LOPRESSOR Take 0.5 tablets (12.5 mg total) by mouth 2 (two) times daily.   ondansetron 4 MG disintegrating  tablet Commonly known as: ZOFRAN-ODT Take 4 mg by mouth daily as needed for nausea.   oxyCODONE 5 MG immediate release tablet Commonly known as: Oxy IR/ROXICODONE Take 1 tablet (5 mg total) by mouth every 4 (four) hours as needed for moderate pain.   polyethylene glycol 17 g packet Commonly known as: MIRALAX / GLYCOLAX Take 17 g by mouth daily as needed for mild constipation.   polyvinyl alcohol 1.4 % ophthalmic solution Commonly known as: LIQUIFILM TEARS Place 1 drop into both eyes as needed for dry eyes.   potassium chloride SA 20 MEQ tablet Commonly known as: KLOR-CON Take 2 tablets (40 mEq total) by mouth 2 (two) times daily for 3 days. What changed: Another medication with the same name was added. Make sure you understand how and when to take each.   potassium chloride SA 20 MEQ tablet Commonly known as: KLOR-CON Take 2 tablets (40 mEq total) by mouth daily. Start taking on: March 31, 2020 What changed: You were already taking a medication with the same name, and this prescription was added. Make sure you understand how and when to take each.   traMADol 50 MG tablet Commonly  known as: ULTRAM Take 1 tablet (50 mg total) by mouth every 8 (eight) hours as needed for moderate pain.       Day of Discharge BP 128/72 (BP Location: Right Arm)   Pulse 91   Temp 98 F (36.7 C) (Oral)   Resp (!) 22   Ht 5\' 7"  (1.702 m)   Wt 72.6 kg   SpO2 93%   BMI 25.06 kg/m   Physical Exam: General: No acute respiratory distress Lungs: Clear to auscultation bilaterally without wheezes or crackles Cardiovascular: Regular rate and rhythm without murmur gallop or rub normal S1 and S2 Abdomen: Nontender, nondistended, soft, bowel sounds positive, no rebound, no ascites, no appreciable mass Extremities: No significant cyanosis, clubbing, or edema bilateral lower extremities  Basic Metabolic Panel: Recent Labs  Lab 03/25/20 0418 03/25/20 1058 03/26/20 0502 03/27/20 0522  03/28/20 0504  NA 131*  --  134* 130* 132*  K 4.8  --  3.9 3.7 3.3*  CL 102  --  101 100 97*  CO2  --   --  21* 21* 25  GLUCOSE 108*  --  105* 101* 87  BUN 9  --  13 13 15   CREATININE 0.70 0.75 0.84 0.73 0.88  CALCIUM  --   --  8.5* 8.3* 8.2*  MG  --   --   --  2.0 2.0    Liver Function Tests: Recent Labs  Lab 03/27/20 0522  AST 57*  ALT 47*  ALKPHOS 99  BILITOT 1.0  PROT 6.5  ALBUMIN 3.3*    CBC: Recent Labs  Lab 03/25/20 0415 03/25/20 0418 03/25/20 1058 03/26/20 0502 03/27/20 0522  WBC 6.0  --  6.0 6.2 6.5  NEUTROABS 4.5  --   --   --   --   HGB 10.4* 10.9* 10.4* 10.1* 10.7*  HCT 31.4* 32.0* 30.5* 31.5* 33.1*  MCV 92.6  --  92.4 93.8 94.6  PLT 326  --  309 303 341    BNP (last 3 results) Recent Labs    03/25/20 0415  BNP 308.2*    Recent Results (from the past 240 hour(s))  Blood culture (routine x 2)     Status: None (Preliminary result)   Collection Time: 03/25/20  6:27 AM   Specimen: BLOOD  Result Value Ref Range Status   Specimen Description   Final    BLOOD RIGHT ARM Performed at [facility] [address], [place], [place] [address]    Special Requests   Final    BOTTLES DRAWN AEROBIC AND ANAEROBIC Blood Culture adequate volume Performed at [facility] [address], [place], [place] [address]    Culture   Final    NO GROWTH 3 DAYS Performed at [facility], [place] [address] [place], [place] [address]    Report Status PENDING  Incomplete  SARS Coronavirus 2 by RT PCR (hospital order, performed in [facility] hospital lab)     Status: None   Collection Time: 03/25/20  6:28 AM  Result Value Ref Range Status   SARS Coronavirus 2 NEGATIVE NEGATIVE Final    Comment: (NOTE) SARS-CoV-2 target nucleic acids are NOT DETECTED.  The SARS-CoV-2 RNA is generally detectable in upper and lower respiratory specimens during the acute phase of infection. The lowest concentration of SARS-CoV-2 viral copies  this assay can detect is 250 copies / mL. A negative result does not preclude SARS-CoV-2 infection and should not be used as the sole basis  for treatment or other patient management decisions.  A negative result may occur with improper specimen collection / handling, submission of specimen other than nasopharyngeal swab, presence of viral mutation(s) within the areas targeted by this assay, and inadequate number of viral copies (<250 copies / mL). A negative result must be combined with clinical observations, patient history, and epidemiological information.  Fact Sheet for Patients:   StrictlyIdeas.no  Fact Sheet for Healthcare Providers: BankingDealers.co.za  This test is not yet approved or  cleared by the [place] FDA and has been authorized for detection and/or diagnosis of SARS-CoV-2 by FDA under an Emergency Use Authorization (EUA).  This EUA will remain in effect (meaning this test can be used) for the duration of the COVID-19 declaration under Section 564(b)(1) of the Act, 21 U.S.C. section 360bbb-3(b)(1), unless the authorization is terminated or revoked sooner.  Performed at [facility], [place] [address] [place], [place] [address]   Blood culture (routine x 2)     Status: None (Preliminary result)   Collection Time: 03/25/20 10:58 AM   Specimen: BLOOD  Result Value Ref Range Status   Specimen Description   Final    BLOOD BLOOD LEFT FOREARM Performed at [facility] [address] [place], [place] [address]    Special Requests   Final    BOTTLES DRAWN AEROBIC AND ANAEROBIC Blood Culture adequate volume Performed at [facility] [address], [place], [place] [address]    Culture   Final    NO GROWTH 3 DAYS Performed at [facility], [place] [address], [place], [place] [address]    Report Status PENDING  Incomplete  MRSA PCR Screening     Status:  None   Collection Time: 03/26/20  2:31 AM   Specimen: Nasal Mucosa; Nasopharyngeal  Result Value Ref Range Status   MRSA by PCR NEGATIVE NEGATIVE Final    Comment:        The GeneXpert MRSA Assay (FDA approved for NASAL specimens only), is one component of a comprehensive MRSA colonization surveillance program. It is not intended to diagnose MRSA infection nor to guide or monitor treatment for MRSA infections. Performed at [facility], [place] [address], [place], [place] [address]   SARS Coronavirus 2 by RT PCR (hospital order, performed in [facility] hospital lab) Nasopharyngeal Nasopharyngeal Swab     Status: None   Collection Time: 03/28/20  9:40 AM   Specimen: Nasopharyngeal Swab  Result Value Ref Range Status   SARS Coronavirus 2 NEGATIVE NEGATIVE Final    Comment: (NOTE) SARS-CoV-2 target nucleic acids are NOT DETECTED.  The SARS-CoV-2 RNA is generally detectable in upper and lower respiratory specimens during the acute phase of infection. The lowest concentration of SARS-CoV-2 viral copies this assay can detect is 250 copies / mL. A negative result does not preclude SARS-CoV-2 infection and should not be used as the sole basis for treatment or other patient management decisions.  A negative result may occur with improper specimen collection / handling, submission of specimen other than nasopharyngeal swab, presence of viral mutation(s) within the areas targeted by this assay, and inadequate number of viral copies (<250 copies / mL). A negative result must be combined with clinical observations, patient history, and epidemiological information.  Fact Sheet for Patients:   StrictlyIdeas.no  Fact Sheet for Healthcare Providers: BankingDealers.co.za  This test is not yet approved or  cleared by the [place] FDA and has been authorized for  detection and/or diagnosis of SARS-CoV-2 by FDA under an  Emergency Use Authorization (EUA).  This EUA will remain in effect (meaning this test can be used) for the duration of the COVID-19 declaration under Section 564(b)(1) of the Act, 21 U.S.C. section 360bbb-3(b)(1), unless the authorization is terminated or revoked sooner.  Performed at [facility], [place] [address], [place], [place] [address]       Time spent in discharge (includes decision making & examination of pt): 35 minutes  03/28/2020, 2:08 PM   Cherene Altes, MD [facility] Office  [phone]

## 2020-03-28 NOTE — Care Management Important Message (Signed)
Important Message  Patient Details IM Letter given to the Patient Name: Christy Wade MRN: [id number] Date of Birth: 12/31/1935   Medicare Important Message Given:  Yes     Kerin Salen 03/28/2020, 11:46 AM

## 2020-03-28 NOTE — Discharge Instructions (Signed)
Pleural Effusion Pleural effusion is an abnormal buildup of fluid in the layers of tissue between the lungs and the inside of the chest (pleural space) The two layers of tissue that line the lungs and the inside of the chest are called pleura. Usually, there is no air in the space between the pleura, only a thin layer of fluid. Some conditions can cause a large amount of fluid to build up, which can cause the lung to collapse if untreated. A pleural effusion is usually caused by another disease that requires treatment. What are the causes? Pleural effusion can be caused by:  Heart failure.  Certain infections, such as pneumonia or tuberculosis.  Cancer.  A blood clot in the lung (pulmonary embolism).  Complications from surgery, such as from open heart surgery.  Liver disease (cirrhosis).  Kidney disease. What are the signs or symptoms? In some cases, pleural effusion may cause no symptoms. If symptoms are present, they may include:  Shortness of breath, especially when lying down.  Chest pain. This may get worse when taking a deep breath.  Fever.  Dry, long-lasting (chronic) cough.  Hiccups.  Rapid breathing. An underlying condition that is causing the pleural effusion (such as heart failure, pneumonia, blood clots, tuberculosis, or cancer) may also cause other symptoms. How is this diagnosed? This condition may be diagnosed based on:  Your symptoms and medical history.  A physical exam.  A chest X-ray.  A procedure to use a needle to remove fluid from the pleural space (thoracentesis). This fluid is tested.  Other imaging studies of the chest, such as ultrasound or CT scan. How is this treated? Depending on the cause of your condition, treatment may include:  Treating the underlying condition that is causing the effusion. When that condition improves, the effusion will also improve. Examples of treatment for underlying conditions include: ? Antibiotic medicines to  treat an infection. ? Diuretics or other heart medicines to treat heart failure.  Thoracentesis.  Placing a thin flexible tube under your skin and into your chest to continuously drain the effusion (indwelling pleural catheter).  Surgery to remove the outer layer of tissue from the pleural space (decortication).  A procedure to put medicine into the chest cavity to seal the pleural space and prevent fluid buildup (pleurodesis).  Chemotherapy and radiation therapy, if you have cancerous (malignant) pleural effusion. These treatments are typically used to treat cancer. They kill certain cells in the body. Follow these instructions at home:  Take over-the-counter and prescription medicines only as told by your health care provider.  Ask your health care provider what activities are safe for you.  Keep track of how long you are able to do mild exercise (such as walking) before you get short of breath. Write down this information to share with your health care provider. Your ability to exercise should improve over time.  Do not use any products that contain nicotine or tobacco, such as cigarettes and e-cigarettes. If you need help quitting, ask your health care provider.  Keep all follow-up visits as told by your health care provider. This is important. Contact a health care provider if:  The amount of time that you are able to do mild exercise: ? Decreases. ? Does not improve with time.  You have a fever. Get help right away if:  You are short of breath.  You develop chest pain.  You develop a new cough. Summary  Pleural effusion is an abnormal buildup of fluid in the layers   of tissue between the lungs and the inside of the chest.  Pleural effusion can have many causes, including heart failure, pulmonary embolism, infections, or cancer.  Symptoms of pleural effusion can include shortness of breath, chest pain, fever, long-lasting (chronic) cough, hiccups, or rapid  breathing.  Diagnosis often involves making images of the chest (such as with ultrasound or X-ray) and removing fluid (thoracentesis) to send for testing.  Treatment for pleural effusion depends on what underlying condition is causing it. This information is not intended to replace advice given to you by your health care provider. Make sure you discuss any questions you have with your health care provider. Document Revised: 06/17/2017 Document Reviewed: 03/10/2017 Elsevier Patient Education  2020 Elsevier Inc.  

## 2020-03-28 NOTE — Progress Notes (Signed)
Chaplain provided support for patient at request of nurse after huddle.She  Had been anxious this morning about being transferred back to [facility] but seemed brighter in the afternoon. She was on phone but welcomed me into room when she saw me. She has much family support and requested prayer.Chaplain offered prayer.  Rev. Tamsen Snider Pager [phone]

## 2020-03-28 NOTE — Progress Notes (Signed)
Central Telemetry called to notify nurse that pt had 19 beats of SVT with HR up to 140.  Pt asymptomatic, HR 81 now and BP 124/66.  Christy Wade notified awaiting any new orders.

## 2020-03-28 NOTE — Progress Notes (Signed)
Nurse notified again by Central Telemetry monitor that pt had 14 beats of VT at [place].  Pt remains asymtomatic.  Jeannette Corpus notified.  Awaiting any new orders.

## 2020-03-30 LAB — CULTURE, BLOOD (ROUTINE X 2)
Culture: NO GROWTH
Culture: NO GROWTH
Special Requests: ADEQUATE
Special Requests: ADEQUATE

## 2020-03-31 ENCOUNTER — Encounter: Payer: Self-pay | Admitting: Family

## 2020-03-31 ENCOUNTER — Telehealth: Payer: Self-pay

## 2020-03-31 ENCOUNTER — Non-Acute Institutional Stay (SKILLED_NURSING_FACILITY): Payer: Medicare PPO | Admitting: Family

## 2020-03-31 DIAGNOSIS — F411 Generalized anxiety disorder: Secondary | ICD-10-CM | POA: Diagnosis not present

## 2020-03-31 DIAGNOSIS — K219 Gastro-esophageal reflux disease without esophagitis: Secondary | ICD-10-CM | POA: Diagnosis not present

## 2020-03-31 LAB — CBC AND DIFFERENTIAL
HCT: 33 — AB (ref 36–46)
Hemoglobin: 11.3 — AB (ref 12.0–16.0)
Platelets: 256 (ref 150–399)
WBC: 5

## 2020-03-31 LAB — COMPREHENSIVE METABOLIC PANEL
Calcium: 8.8 (ref 8.7–10.7)
GFR calc Af Amer: 64.15
GFR calc non Af Amer: 55.35

## 2020-03-31 LAB — CBC: RBC: 3.65 — AB (ref 3.87–5.11)

## 2020-03-31 LAB — BASIC METABOLIC PANEL
BUN: 12 (ref 4–21)
CO2: 29 — AB (ref 13–22)
Chloride: 91 — AB (ref 99–108)
Creatinine: 1 (ref 0.5–1.1)
Glucose: 88
Potassium: 3.9 (ref 3.4–5.3)
Sodium: 131 — AB (ref 137–147)

## 2020-03-31 NOTE — Progress Notes (Signed)
Location:    [facility]   Nursing Home Room Number: 101-B Place of Service:  SNF (31) Provider:  Marlowe Sax, NP    Patient Care Team: Ronnald Nian, DO as PCP - General (Family Medicine) Michael Boston, MD as Consulting Physician (General Surgery) Milus Banister, MD as Attending Physician (Gastroenterology)  Extended Emergency Contact Information Primary Emergency Contact: Irving Burton [place] Phone: [phone] Relation: Sister Secondary Emergency Contact: [facility] Mobile Phone: [phone] Relation: Daughter  Code Status:  Full Code  Goals of care: Advanced Directive information Advanced Directives 03/31/2020  Does Patient Have a Medical Advance Directive? No  Type of Advance Directive -  Does patient want to make changes to medical advance directive? No - Patient declined  Copy of [place] in Chart? -     Chief Complaint  Patient presents with  . Acute Visit    Chocking/Coughing with swallowing.    HPI:  Pt is a 84 y.o. female seen today for an acute visit for evaluation of chocking feeling and coughing with swallowing.Facility Nurse states patient coughing and spits out every time she swallows.states has a choking feeling on mid-chest.she denies any acid reflux.she denies any fever or chills.she is status post hospital admission 03/05/2020 - 03/14/2020 for fall on the right side at home against a table.she struck her right shoulder and right chest.X-ray showed AC joint separation deemed inoperable.Also showed right ribs fractures of the 7th and 8 th with some chest wall air  but no pneumothorax.  Past Medical History:  Diagnosis Date  . Anemia    on iron now  . Cataract   . Hypertension   . Hypothyroidism   . Osteoporosis   . PONV (postoperative nausea and vomiting)   . Thyroid disease   . Varicose vein    Past Surgical History:  Procedure Laterality Date  . ABDOMINAL HYSTERECTOMY  1975     partial  . APPENDECTOMY  1975   with hysterectomy  . HEMORROIDECTOMY     [phone]  . LAPAROSCOPIC SMALL BOWEL RESECTION N/A 11/29/2014   Procedure: LAPAROSCOPIC SMALL INTESTINE RESECTION;  Surgeon: Michael Boston, MD;  Location: [facility];  Service: General;  Laterality: N/A;  . VARICOSE VEIN SURGERY Left 1971    Allergies  Allergen Reactions  . Codeine Nausea Only    REACTION: nausea  . Sulfamethoxazole Rash    REACTION: rash    Allergies as of 03/31/2020      Reactions   Codeine Nausea Only   REACTION: nausea   Sulfamethoxazole Rash   REACTION: rash      Medication List       Accurate as of March 31, 2020  3:41 PM. If you have any questions, ask your nurse or doctor.        STOP taking these medications   metoprolol tartrate 25 MG tablet Commonly known as: LOPRESSOR Stopped by: Sandrea Hughs, NP   traMADol 50 MG tablet Commonly known as: ULTRAM Stopped by: Sandrea Hughs, NP     TAKE these medications   acetaminophen 325 MG tablet Commonly known as: TYLENOL Take 2 tablets (650 mg total) by mouth every 6 (six) hours as needed for mild pain, fever or headache.   amLODipine 10 MG tablet Commonly known as: NORVASC Take 1 tablet (10 mg total) by mouth daily.   bisacodyl 10 MG suppository Commonly known as: DULCOLAX Place 10 mg rectally as needed for moderate constipation.   cycloSPORINE  0.05 % ophthalmic emulsion Commonly known as: RESTASIS Place 1 drop into both eyes 2 (two) times daily.   furosemide 40 MG tablet Commonly known as: LASIX Take 1 tablet (40 mg total) by mouth daily. What changed: Another medication with the same name was removed. Continue taking this medication, and follow the directions you see here. Changed by: Sandrea Hughs, NP   hydrALAZINE 25 MG tablet Commonly known as: APRESOLINE Take 1 tablet (25 mg total) by mouth every 8 (eight) hours.   hydrOXYzine 10 MG tablet Commonly known as: ATARAX/VISTARIL Take 1 tablet  (10 mg total) by mouth 3 (three) times daily as needed for anxiety.   levothyroxine 50 MCG tablet Commonly known as: SYNTHROID TAKE 1 TABLET BY MOUTH EVERY MORNING   lidocaine 2 % solution Commonly known as: XYLOCAINE Use as directed 15 mLs in the mouth or throat every 4 (four) hours as needed for mouth pain.   MILK OF MAGNESIA PO Take 30 mLs by mouth.   ondansetron 4 MG disintegrating tablet Commonly known as: ZOFRAN-ODT Take 4 mg by mouth daily as needed for nausea.   oxyCODONE 5 MG immediate release tablet Commonly known as: Oxy IR/ROXICODONE Take 1 tablet (5 mg total) by mouth every 4 (four) hours as needed for moderate pain.   polyethylene glycol 17 g packet Commonly known as: MIRALAX / GLYCOLAX Take 17 g by mouth daily as needed for mild constipation.   polyvinyl alcohol 1.4 % ophthalmic solution Commonly known as: LIQUIFILM TEARS Place 1 drop into both eyes as needed for dry eyes.   potassium chloride SA 20 MEQ tablet Commonly known as: KLOR-CON Take 2 tablets (40 mEq total) by mouth daily. What changed: Another medication with the same name was removed. Continue taking this medication, and follow the directions you see here. Changed by: Sandrea Hughs, NP   RA SALINE ENEMA RE Place rectally as needed.       Review of Systems  Constitutional: Negative for appetite change, chills, fatigue and fever.  HENT: Negative for congestion, rhinorrhea, sinus pressure, sinus pain, sneezing and sore throat.   Respiratory: Negative for chest tightness, shortness of breath and wheezing.        Coughing and choking feeling with swallowing   Cardiovascular: Negative for chest pain, palpitations and leg swelling.  Gastrointestinal: Negative for abdominal distention, abdominal pain, constipation, diarrhea, nausea and vomiting.  Genitourinary: Negative for difficulty urinating, dysuria, flank pain, frequency and urgency.  Musculoskeletal: Positive for gait problem. Negative for  arthralgias and joint swelling.  Skin: Negative for color change, pallor and rash.  Neurological: Negative for dizziness, speech difficulty, light-headedness and headaches.  Psychiatric/Behavioral: Negative for agitation, behavioral problems, confusion and sleep disturbance. The patient is nervous/anxious.     Immunization History  Administered Date(s) Administered  . Influenza Whole 07/19/2005, 04/26/2008, 04/18/2009  . Influenza, High Dose Seasonal PF 04/11/2013, 04/03/2015  . Influenza-Unspecified 04/18/2014, 04/13/2015, 03/19/2018  . Moderna SARS-COVID-2 Vaccination 08/05/2019, 08/10/2019  . Pneumococcal Conjugate-13 09/20/2013  . Pneumococcal Polysaccharide-23 04/18/2006, 06/30/2011  . Td 12/06/2005, 05/15/2008  . Tdap 05/30/2018  . Zoster 01/03/2008  . Zoster Recombinat (Shingrix) 12/09/2017, 02/18/2018   Pertinent  Health Maintenance Due  Topic Date Due  . INFLUENZA VACCINE  02/17/2020  . DEXA SCAN  Completed  . PNA vac Low Risk Adult  Completed   Fall Risk  01/02/2020 01/15/2019 05/29/2018 10/31/2017 10/13/2016  Falls in the past year? 0 0 0 No No  Number falls in past yr: 0 0 - - -  Injury with Fall? 0 0 - - -    Vitals:   03/31/20 1455  BP: (!) 144/90  Pulse: 98  Resp: 18  Temp: (!) 97.5 F (36.4 C)  Weight: 140 lb (63.5 kg)  Height: 5\' 7"  (1.702 m)   Body mass index is 21.93 kg/m. Physical Exam Vitals and nursing note reviewed.  Constitutional:      General: She is not in acute distress.    Appearance: She is normal weight. She is not ill-appearing.  HENT:     Head: Normocephalic.     Nose: Nose normal. No congestion or rhinorrhea.     Mouth/Throat:     Mouth: Mucous membranes are moist.     Pharynx: Oropharynx is clear. No oropharyngeal exudate or posterior oropharyngeal erythema.  Eyes:     General: No scleral icterus.       Right eye: No discharge.        Left eye: No discharge.     Extraocular Movements: Extraocular movements intact.      Conjunctiva/sclera: Conjunctivae normal.     Pupils: Pupils are equal, round, and reactive to light.  Neurological:     Mental Status: She is alert.    Labs reviewed: Recent Labs    04/16/19 0912 11/14/19 1547 03/07/20 0719 03/07/20 1407 03/10/20 1143 03/10/20 1801 03/26/20 0502 03/27/20 0522 03/28/20 0504  NA 133*   < > 121*  120*   < > 127*   < > 134* 130* 132*  K 4.0   < > 3.8   < >  --    < > 3.9 3.7 3.3*  CL 102   < > 92*   < >  --    < > 101 100 97*  CO2 21   < > 18*   < >  --    < > 21* 21* 25  GLUCOSE 94   < > 107*   < >  --    < > 105* 101* 87  BUN 11   < > 11   < >  --    < > 13 13 15   CREATININE 1.01*   < > 0.90   < >  --    < > 0.84 0.73 0.88  CALCIUM 8.9   < > 7.9*   < >  --    < > 8.5* 8.3* 8.2*  MG  --    < >  --   --  1.8  --   --  2.0 2.0  PHOS 3.7  --  2.2*  --   --   --   --   --   --    < > = values in this interval not displayed.   Recent Labs    03/05/20 0913 03/07/20 0719 03/27/20 0522  AST 74*  --  57*  ALT 35  --  47*  ALKPHOS 64  --  99  BILITOT 1.3*  --  1.0  PROT 8.3*  --  6.5  ALBUMIN 4.3 3.2* 3.3*   Recent Labs    03/09/20 0628 03/09/20 0628 03/10/20 0738 03/10/20 0738 03/25/20 0415 03/25/20 0418 03/25/20 1058 03/26/20 0502 03/27/20 0522  WBC 10.5   < > 7.7   < > 6.0   < > 6.0 6.2 6.5  NEUTROABS 7.5  --  4.7  --  4.5  --   --   --   --   HGB 10.5*   < > 10.4*   < >  10.4*   < > 10.4* 10.1* 10.7*  HCT 29.8*   < > 29.3*   < > 31.4*   < > 30.5* 31.5* 33.1*  MCV 87.1   < > 87.5   < > 92.6   < > 92.4 93.8 94.6  PLT 188   < > 214   < > 326   < > 309 303 341   < > = values in this interval not displayed.   Lab Results  Component Value Date   TSH 8.611 (H) 03/05/2020   No results found for: HGBA1C Lab Results  Component Value Date   CHOL 152 01/15/2019   HDL 78.90 01/15/2019   LDLCALC 59 01/15/2019   TRIG 71.0 01/15/2019   CHOLHDL 2 01/15/2019    Significant Diagnostic Results in last 30 days:  DG Chest 2 View  Result  Date: 03/25/2020 CLINICAL DATA:  Shortness of breath. Fell with fractured ribs on the right on 03/05/2020. Increasing shortness of breath and low oxygen saturation tonight. EXAM: CHEST - 2 VIEW COMPARISON:  03/05/2020 FINDINGS: Cardiac enlargement. Probable emphysematous changes in the lungs. Since the previous study, there are increasing infiltrates or atelectasis in both lung bases with developing moderate pleural effusions bilaterally. Multiple displaced right rib fractures as seen previously. No obvious pneumothorax. IMPRESSION: 1. Cardiac enlargement. 2. Developing moderate bilateral pleural effusions with developing basilar atelectasis or infiltration. 3. Multiple displaced right rib fractures as seen previously. Electronically Signed   By: Lucienne Capers M.D.   On: 03/25/2020 03:14   DG Shoulder Right  Result Date: 03/05/2020 CLINICAL DATA:  Fall with shoulder pain EXAM: RIGHT SHOULDER - 2+ VIEW COMPARISON:  None. FINDINGS: Posterior right seventh and eighth rib fractures. No visible pneumothorax but there is soft tissue gas on the lateral view and marked displacement of 1 of the fractures. Corticated defect in the acromion. Offset at the acromioclavicular joint by at least 50%. No fracture deformity. No coracoclavicular interval widening IMPRESSION: 1. AC joint offset suggesting separated shoulder. 2. Right rib fractures involving the seventh and eighth ribs at least. One of these fractures is markedly displaced and there is adjacent chest wall emphysema. No visible pneumothorax. 3. Os acromiale. Electronically Signed   By: Monte Fantasia M.D.   On: 03/05/2020 09:48   CT Angio Chest PE W and/or Wo Contrast  Result Date: 03/25/2020 CLINICAL DATA:  Chest pain. Shortness of breath. Hypoxia. Recent fall with rib fractures. EXAM: CT ANGIOGRAPHY CHEST WITH CONTRAST TECHNIQUE: Multidetector CT imaging of the chest was performed using the standard protocol during bolus administration of intravenous  contrast. Multiplanar CT image reconstructions and MIPs were obtained to evaluate the vascular anatomy. CONTRAST:  128mL OMNIPAQUE IOHEXOL 350 MG/ML SOLN COMPARISON:  Two-view chest x-ray FINDINGS: Cardiovascular: The heart size is normal. Aortic arch and great vessel origins are within normal limits. Pulmonary artery opacification is satisfactory. Pulmonary artery size is normal. No focal filling defects are present to suggest pulmonary embolus. Mediastinum/Nodes: No significant mediastinal, hilar, or axillary adenopathy is present. Lungs/Pleura: Moderate size right pleural effusion is present. Right lower lobe atelectasis is associated. Smaller left pleural effusion is present. More mild volume loss is present on the left. Fluid is present along the minor fissure with mild associated atelectasis. Minimal ground-glass attenuation is present otherwise. Upper Abdomen: . water density 2 cm cyst is present at the posterior right kidney. Visualized upper abdomen is otherwise unremarkable. Musculoskeletal: Right posterior 6-11 rib fractures are noted. No pneumothorax is present. Exaggerated thoracic kyphosis  is present. Vertebral body heights are maintained. No focal lytic or blastic lesions are present. Review of the MIP images confirms the above findings. IMPRESSION: 1. No pulmonary embolus. 2. Moderate size right pleural effusion and small left pleural effusion. 3. Right lower lobe volume loss/atelectasis. 4. Minimal volume loss at the left base. 5. Minimal scattered ground-glass attenuation likely represents edema or atelectasis. Infection is considered less likely. 6. Right posterior 6-11 rib fractures without pneumothorax. 7. Exaggerated thoracic kyphosis. Electronically Signed   By: San Morelle M.D.   On: 03/25/2020 05:03   DG Chest Port 1 View  Result Date: 03/27/2020 CLINICAL DATA:  Pleural effusion EXAM: PORTABLE CHEST 1 VIEW COMPARISON:  03/25/2020 FINDINGS: Shallow inspiration. Cardiac  enlargement. Bilateral pleural effusions greater on the right. Bilateral basilar atelectasis or infiltration, also greater on the right. Similar appearance to previous study. IMPRESSION: Bilateral pleural effusions and basilar atelectasis or infiltration, greater on the right. Electronically Signed   By: Lucienne Capers M.D.   On: 03/27/2020 06:17   DG Chest Portable 1 View  Result Date: 03/05/2020 CLINICAL DATA:  Fall with rib fractures on shoulder radiograph EXAM: PORTABLE CHEST 1 VIEW COMPARISON:  11/10/2005 FINDINGS: Right seventh, eighth, and ninth rib fractures with prominent displacement. Streaky opacity at the right base. No visible effusion or pneumothorax. Mild cardiomegaly. Aortic tortuosity. Shoulder findings as on dedicated study IMPRESSION: 1. Right seventh through ninth rib fractures with prominent displacement. 2. Atelectasis at the right base.  No visible pneumothorax. Electronically Signed   By: Monte Fantasia M.D.   On: 03/05/2020 11:54   ECHOCARDIOGRAM COMPLETE  Result Date: 03/25/2020    ECHOCARDIOGRAM REPORT   Patient Name:   Christy Wade Date of Exam: 03/25/2020 Medical Rec #:  [id number]       Height:       67.0 in Accession #:    [id number]      Weight:       160.0 lb Date of Birth:  05/20/1936      BSA:          1.839 m Patient Age:    84 years        BP:           145/90 mmHg Patient Gender: F               HR:           93 bpm. Exam Location:  Inpatient Procedure: 2D Echo Indications:    786.09 dyspnea  History:        Patient has no prior history of Echocardiogram examinations.                 Risk Factors:Hypertension.  Sonographer:    Jannett Celestine RDCS (AE) Referring Phys: [id number] JARED E SEGAL IMPRESSIONS  1. Left ventricular ejection fraction, by estimation, is 55 to 60%. The left ventricle has normal function. The left ventricle has no regional wall motion abnormalities. Left ventricular diastolic function could not be evaluated.  2. Right ventricular systolic function is  normal. The right ventricular size is mildly enlarged.  3. The mitral valve is degernerative. There is moderate thickening of the mitral valve leaflet(s). Normal mobility of the mitral valve leaflets. Moderate mitral annular calcification.  4. The aortic valve is normal in structure. Aortic valve regurgitation is mild. No aortic stenosis is present.  5. The inferior vena cava is normal in size with greater than 50% respiratory variability, suggesting right atrial pressure of 3 mmHg. FINDINGS  Left Ventricle: Left ventricular ejection fraction, by estimation, is 55 to 60%. The left ventricle has normal function. The left ventricle has no regional wall motion abnormalities. The left ventricular internal cavity size was normal in size. There is  no left ventricular hypertrophy. Left ventricular diastolic function could not be evaluated. Right Ventricle: The right ventricular size is mildly enlarged. No increase in right ventricular wall thickness. Right ventricular systolic function is normal. Left Atrium: Left atrial size was normal in size. Right Atrium: Right atrial size was normal in size. Pericardium: There is no evidence of pericardial effusion. Mitral Valve: The mitral valve is degenerative in appearance. There is moderate thickening of the mitral valve leaflet(s). Normal mobility of the mitral valve leaflets. Moderate mitral annular calcification. Mild to moderate mitral valve regurgitation. No evidence of mitral valve stenosis. Tricuspid Valve: The tricuspid valve is normal in structure. Tricuspid valve regurgitation is mild . No evidence of tricuspid stenosis. Aortic Valve: The aortic valve is normal in structure.. There is moderate thickening and moderate calcification of the aortic valve. Aortic valve regurgitation is mild. No aortic stenosis is present. There is moderate thickening of the aortic valve. There is moderate calcification of the aortic valve. Pulmonic Valve: The pulmonic valve was normal in  structure. Pulmonic valve regurgitation is mild. No evidence of pulmonic stenosis. Aorta: The aortic root is normal in size and structure. Venous: The inferior vena cava is normal in size with greater than 50% respiratory variability, suggesting right atrial pressure of 3 mmHg. IAS/Shunts: No atrial level shunt detected by color flow Doppler.  LEFT VENTRICLE PLAX 2D LVIDd:         5.00 cm LVIDs:         3.50 cm LV PW:         1.00 cm LV IVS:        0.90 cm LVOT diam:     1.90 cm LV SV:         57 LV SV Index:   31 LVOT Area:     2.84 cm  RIGHT VENTRICLE TAPSE (M-mode): 2.7 cm LEFT ATRIUM             Index       RIGHT ATRIUM           Index LA diam:        3.30 cm 1.79 cm/m  RA Area:     11.20 cm LA Vol (A2C):   65.6 ml 35.67 ml/m RA Volume:   21.50 ml  11.69 ml/m LA Vol (A4C):   39.5 ml 21.48 ml/m LA Biplane Vol: 56.5 ml 30.72 ml/m  AORTIC VALVE LVOT Vmax:   116.00 cm/s LVOT Vmean:  79.300 cm/s LVOT VTI:    0.200 m  AORTA Ao Root diam: 2.90 cm  SHUNTS Systemic VTI:  0.20 m Systemic Diam: 1.90 cm Fransico Him MD Electronically signed by Fransico Him MD Signature Date/Time: 03/25/2020/4:23:22 PM    Final     Assessment/Plan 1. Generalized anxiety disorder Start on Alprazolam 0.25 mg tablet one by mouth daily as needed for anxiety. Continue to monitor.   2. Gastroesophageal reflux disease without esophagitis Spitting each time she attempts to swallow with feeling of mid chest choking. Will start Protonix 40 mg tablet one by mouth daily Continue to monitor will refer to GI is symptoms persist .  Family/ staff Communication: Reviewed plan of care with patient and facility Nurse   Labs/tests ordered:  CBC,BMP

## 2020-03-31 NOTE — Telephone Encounter (Signed)
Patient was recently discharged from the hospital on 03/28/20.   No TCM completed, patient does not qualify for TCM services due to being d/c to a SNF.  Per discharge summary patient needs follow up with PCP. No HFU scheduled at this time- FYI!

## 2020-04-01 ENCOUNTER — Emergency Department (HOSPITAL_COMMUNITY): Payer: Medicare PPO

## 2020-04-01 ENCOUNTER — Encounter (HOSPITAL_COMMUNITY): Payer: Self-pay

## 2020-04-01 ENCOUNTER — Emergency Department (HOSPITAL_COMMUNITY)
Admission: EM | Admit: 2020-04-01 | Discharge: 2020-04-01 | Disposition: A | Discharge status: Home / Self Care | Payer: Medicare PPO | Attending: Emergency Medicine | Admitting: Emergency Medicine

## 2020-04-01 DIAGNOSIS — R0602 Shortness of breath: Secondary | ICD-10-CM | POA: Insufficient documentation

## 2020-04-01 DIAGNOSIS — Z20822 Contact with and (suspected) exposure to covid-19: Secondary | ICD-10-CM | POA: Insufficient documentation

## 2020-04-01 DIAGNOSIS — Z79890 Hormone replacement therapy (postmenopausal): Secondary | ICD-10-CM | POA: Diagnosis not present

## 2020-04-01 DIAGNOSIS — J90 Pleural effusion, not elsewhere classified: Secondary | ICD-10-CM | POA: Insufficient documentation

## 2020-04-01 DIAGNOSIS — E039 Hypothyroidism, unspecified: Secondary | ICD-10-CM | POA: Insufficient documentation

## 2020-04-01 DIAGNOSIS — Z79899 Other long term (current) drug therapy: Secondary | ICD-10-CM | POA: Diagnosis not present

## 2020-04-01 DIAGNOSIS — E871 Hypo-osmolality and hyponatremia: Secondary | ICD-10-CM | POA: Insufficient documentation

## 2020-04-01 DIAGNOSIS — I110 Hypertensive heart disease with heart failure: Secondary | ICD-10-CM | POA: Diagnosis not present

## 2020-04-01 DIAGNOSIS — J9811 Atelectasis: Secondary | ICD-10-CM | POA: Diagnosis not present

## 2020-04-01 DIAGNOSIS — R531 Weakness: Secondary | ICD-10-CM | POA: Insufficient documentation

## 2020-04-01 DIAGNOSIS — I509 Heart failure, unspecified: Secondary | ICD-10-CM | POA: Diagnosis not present

## 2020-04-01 DIAGNOSIS — R197 Diarrhea, unspecified: Secondary | ICD-10-CM | POA: Diagnosis not present

## 2020-04-01 DIAGNOSIS — S2231XA Fracture of one rib, right side, initial encounter for closed fracture: Secondary | ICD-10-CM | POA: Diagnosis not present

## 2020-04-01 LAB — BASIC METABOLIC PANEL
Anion gap: 14 (ref 5–15)
BUN: 14 mg/dL (ref 8–23)
CO2: 26 mmol/L (ref 22–32)
Calcium: 8.9 mg/dL (ref 8.9–10.3)
Chloride: 91 mmol/L — ABNORMAL LOW (ref 98–111)
Creatinine, Ser: 1.08 mg/dL — ABNORMAL HIGH (ref 0.44–1.00)
GFR calc Af Amer: 56 mL/min — ABNORMAL LOW (ref >60)
GFR calc non Af Amer: 48 mL/min — ABNORMAL LOW (ref >60)
Glucose, Bld: 98 mg/dL (ref 70–99)
Potassium: 4.4 mmol/L (ref 3.5–5.1)
Sodium: 131 mmol/L — ABNORMAL LOW (ref 135–145)

## 2020-04-01 LAB — MAGNESIUM: Magnesium: 1.7 mg/dL (ref 1.7–2.4)

## 2020-04-01 LAB — HEPATIC FUNCTION PANEL
ALT: 30 U/L (ref 0–44)
AST: 37 U/L (ref 15–41)
Albumin: 3.3 g/dL — ABNORMAL LOW (ref 3.5–5.0)
Alkaline Phosphatase: 97 U/L (ref 38–126)
Bilirubin, Direct: 0.3 mg/dL — ABNORMAL HIGH (ref 0.0–0.2)
Indirect Bilirubin: 0.8 mg/dL (ref 0.3–0.9)
Total Bilirubin: 1.1 mg/dL (ref 0.3–1.2)
Total Protein: 7 g/dL (ref 6.5–8.1)

## 2020-04-01 LAB — CBC
HCT: 36.9 % (ref 36.0–46.0)
Hemoglobin: 11.8 g/dL — ABNORMAL LOW (ref 12.0–15.0)
MCH: 29.9 pg (ref 26.0–34.0)
MCHC: 32 g/dL (ref 30.0–36.0)
MCV: 93.7 fL (ref 80.0–100.0)
Platelets: 333 10*3/uL (ref 150–400)
RBC: 3.94 MIL/uL (ref 3.87–5.11)
RDW: 16.1 % — ABNORMAL HIGH (ref 11.5–15.5)
WBC: 10.3 10*3/uL (ref 4.0–10.5)
nRBC: 0 % (ref 0.0–0.2)

## 2020-04-01 LAB — SARS CORONAVIRUS 2 BY RT PCR (HOSPITAL ORDER, PERFORMED IN ~~LOC~~ HOSPITAL LAB): SARS Coronavirus 2: NEGATIVE

## 2020-04-01 LAB — TROPONIN I (HIGH SENSITIVITY): Troponin I (High Sensitivity): 17 ng/L (ref <18)

## 2020-04-01 LAB — BRAIN NATRIURETIC PEPTIDE: B Natriuretic Peptide: 104.4 pg/mL — ABNORMAL HIGH (ref 0.0–100.0)

## 2020-04-01 MED ORDER — LIDOCAINE VISCOUS HCL 2 % MT SOLN
15.0000 mL | Freq: Once | OROMUCOSAL | Status: AC
  Administered 2020-04-01: 15 mL via ORAL
  Filled 2020-04-01: qty 15

## 2020-04-01 MED ORDER — ALUM & MAG HYDROXIDE-SIMETH 200-200-20 MG/5ML PO SUSP
30.0000 mL | Freq: Once | ORAL | Status: AC
  Administered 2020-04-01: 30 mL via ORAL
  Filled 2020-04-01: qty 30

## 2020-04-01 MED ORDER — PANTOPRAZOLE SODIUM 40 MG PO TBEC
40.0000 mg | DELAYED_RELEASE_TABLET | Freq: Once | ORAL | Status: AC
  Administered 2020-04-01: 40 mg via ORAL
  Filled 2020-04-01: qty 1

## 2020-04-01 MED ORDER — FUROSEMIDE 10 MG/ML IJ SOLN
40.0000 mg | Freq: Once | INTRAMUSCULAR | Status: AC
  Administered 2020-04-01: 40 mg via INTRAMUSCULAR
  Filled 2020-04-01: qty 4

## 2020-04-01 MED ORDER — FUROSEMIDE 20 MG PO TABS: 20.0000 mg | ORAL_TABLET | Freq: Every evening | ORAL | 0 refills | Status: DC

## 2020-04-01 MED ORDER — FUROSEMIDE 10 MG/ML IJ SOLN: 40.0000 mg | Freq: Once | INTRAMUSCULAR | Status: DC

## 2020-04-01 NOTE — ED Provider Notes (Signed)
[facility] Provider Note   CSN: [id number] Arrival date & time: 04/01/20  1155     History No chief complaint on file.   [facility] is a 84 y.o. female.  HPI  Patient is an 84 year old female with a past medical history significant for CHF with EF within normal limits on echo within the last month, MVR, hyponatremia secondary to chronic diarrhea secondary to intestinal surgery worsened by recent trial of HCTZ which patient is no longer on, hypothyroidism.  Patient is presented today with chief complaint of shortness of breath.  This is been ongoing for several weeks.  This is in the context of a fall that occurred just over a month ago where she fell and fractured several ribs on the right side.  Her weakness and fall was attributed to hyponatremia secondary to HCTZ and chronic diarrhea.  She was taken off this and put on sodium tablets which she has been taking.  Her sodium levels seem to improve her last hospital stay.  She also has been taking furosemide 40 mg daily in the morning.  He states she is compliant with his and all other medications.  She was placed on 2 L of oxygen during her last hospital stay which improved after diuresis but appears that she has continued to stay on this chronically for comfort.  Patient has complained of choking when she swallows but states that she has not had any difficulty managing her secretions or swallowing liquids only occasionally does this coughing occurs she states she feels very anxious about it.  Patient's daughter is at bedside and states the patient is a supremely anxious at baseline.    Past Medical History:  Diagnosis Date  . Anemia    on iron now  . Cataract   . Hypertension   . Hypothyroidism   . Osteoporosis   . PONV (postoperative nausea and vomiting)   . Thyroid disease   . Varicose vein     Patient Active Problem List   Diagnosis Date Noted  . CHF (congestive heart failure)  ([facility]) 03/25/2020  . Respiratory failure ([place]) 03/25/2020  . Multiple fractures of ribs, right side, subsequent encounter for fracture with routine healing 03/06/2020  . Hyponatremia 03/05/2020  . Diarrhea 07/24/2018  . Vaginitis and vulvovaginitis 05/22/2016  . Well adult exam 10/01/2015  . Chronic chest wall pain 09/09/2014  . Mitral valve regurgitation 06/21/2012  . Essential hypertension 01/03/2008  . Osteopenia 03/28/2007  . Hypothyroidism 02/16/2007    Past Surgical History:  Procedure Laterality Date  . ABDOMINAL HYSTERECTOMY  1975   partial  . APPENDECTOMY  1975   with hysterectomy  . HEMORROIDECTOMY     [phone]  . LAPAROSCOPIC SMALL BOWEL RESECTION N/A 11/29/2014   Procedure: LAPAROSCOPIC SMALL INTESTINE RESECTION;  Surgeon: Michael Boston, MD;  Location: [facility];  Service: General;  Laterality: N/A;  . VARICOSE VEIN SURGERY Left 1971     OB History   No obstetric history on file.     Family History  Problem Relation Age of Onset  . Bone cancer Mother   . Diabetes Father   . Stroke Father   . Breast cancer Sister   . Stomach cancer Neg Hx   . Colon cancer Neg Hx     Social History   Tobacco Use  . Smoking status: Never Smoker  . Smokeless tobacco: Never Used  Vaping Use  . Vaping Use: Never used  Substance Use Topics  . Alcohol  use: No    Alcohol/week: 0.0 standard drinks  . Drug use: No    Home Medications Prior to Admission medications   Medication Sig Start Date End Date Taking? Authorizing Provider  acetaminophen (TYLENOL) 500 MG tablet Take 500 mg by mouth every 6 (six) hours as needed for moderate pain.   Yes [provider]  amLODipine (NORVASC) 10 MG tablet Take 1 tablet (10 mg total) by mouth daily. 03/15/20  Yes Nita Sells, MD  bisacodyl (DULCOLAX) 10 MG suppository Place 10 mg rectally as needed for moderate constipation.   Yes [provider]  cycloSPORINE (RESTASIS) 0.05 % ophthalmic emulsion Place 1 drop  into both eyes 2 (two) times daily.    Yes [provider]  furosemide (LASIX) 40 MG tablet Take 1 tablet (40 mg total) by mouth daily. 03/31/20  Yes Cherene Altes, MD  hydrALAZINE (APRESOLINE) 25 MG tablet Take 1 tablet (25 mg total) by mouth every 8 (eight) hours. 03/14/20  Yes Nita Sells, MD  hydrOXYzine (ATARAX/VISTARIL) 10 MG tablet Take 1 tablet (10 mg total) by mouth 3 (three) times daily as needed for anxiety. 03/14/20  Yes Nita Sells, MD  levothyroxine (SYNTHROID) 50 MCG tablet TAKE 1 TABLET BY MOUTH EVERY MORNING 08/13/19  Yes Luetta Nutting, DO  lidocaine (XYLOCAINE) 2 % solution Use as directed 15 mLs in the mouth or throat every 4 (four) hours as needed for mouth pain. 03/28/20  Yes Cherene Altes, MD  ondansetron (ZOFRAN-ODT) 4 MG disintegrating tablet Take 4 mg by mouth daily as needed for nausea. 03/04/20  Yes [provider]  oxyCODONE (OXY IR/ROXICODONE) 5 MG immediate release tablet Take 1 tablet (5 mg total) by mouth every 4 (four) hours as needed for moderate pain. 03/14/20  Yes Nita Sells, MD  pantoprazole (PROTONIX) 40 MG tablet Take 40 mg by mouth daily.   Yes [provider]  polyethylene glycol (MIRALAX / GLYCOLAX) 17 g packet Take 17 g by mouth daily as needed for mild constipation. 03/28/20  Yes Cherene Altes, MD  potassium chloride SA (KLOR-CON) 20 MEQ tablet Take 2 tablets (40 mEq total) by mouth daily. 03/31/20  Yes Cherene Altes, MD  sodium chloride 1 g tablet Take 2 g by mouth 2 (two) times daily with a meal.   Yes [provider]  traMADol (ULTRAM) 50 MG tablet Take 50 mg by mouth every 6 (six) hours as needed for moderate pain.   Yes [provider]  acetaminophen (TYLENOL) 325 MG tablet Take 2 tablets (650 mg total) by mouth every 6 (six) hours as needed for mild pain, fever or headache. Patient not taking: Reported on 04/01/2020 03/28/20   Cherene Altes, MD  furosemide (LASIX)  20 MG tablet Take 1 tablet (20 mg total) by mouth at bedtime for 14 days. 04/01/20 04/15/20  Tedd Sias, PA  polyvinyl alcohol (LIQUIFILM TEARS) 1.4 % ophthalmic solution Place 1 drop into both eyes as needed for dry eyes. Patient not taking: Reported on 04/01/2020 03/28/20   Cherene Altes, MD    Allergies    Codeine and Sulfamethoxazole  Review of Systems   Review of Systems  Physical Exam Updated Vital Signs BP (!) 150/77 (BP Location: Left Arm)   Pulse 90   Temp 99.4 F (37.4 C) (Oral)   Resp (!) 21   SpO2 96%   Physical Exam Vitals and nursing note reviewed.  Constitutional:      General: She is not in acute  distress.    Comments: Frail 84 year old female somewhat anxious appearing.  Pleasant, able answer questions appropriately follow commands.  Speaking full sentences.  HENT:     Head: Normocephalic and atraumatic.     Nose: Nose normal.     Mouth/Throat:     Mouth: Mucous membranes are dry.     Comments: Few small circular ulcerations of the tongue and buccal mucosa Eyes:     General: No scleral icterus. Cardiovascular:     Rate and Rhythm: Normal rate and regular rhythm.     Pulses: Normal pulses.     Heart sounds: Normal heart sounds.  Pulmonary:     Effort: Pulmonary effort is normal. No respiratory distress.     Breath sounds: Normal breath sounds. No wheezing.     Comments: Some tenderness to palpation of the right lateral ribs.  Lungs are clear to auscultation all fields.  Respiratory rate is approximately 20-22.  Speaking in full sentences and no increased work of breathing.  Does not desaturate on room air. Chest:     Chest wall: Tenderness present.  Abdominal:     Palpations: Abdomen is soft.     Tenderness: There is no abdominal tenderness. There is no guarding or rebound.  Musculoskeletal:     Cervical back: Normal range of motion.     Right lower leg: No edema.     Left lower leg: No edema.  Skin:    General: Skin is warm and dry.      Capillary Refill: Capillary refill takes less than 2 seconds.  Neurological:     Mental Status: She is alert and oriented to person, place, and time. Mental status is at baseline.     Comments: Moves all 4 extremities spontaneously.  Strength is appropriate.  No cranial nerve deficits.  Psychiatric:        Mood and Affect: Mood normal.        Behavior: Behavior normal.     ED Results / Procedures / Treatments   Labs (all labs ordered are listed, but only abnormal results are displayed) Labs Reviewed  BASIC METABOLIC PANEL - Abnormal; Notable for the following components:      Result Value   Sodium 131 (*)    Chloride 91 (*)    Creatinine, Ser 1.08 (*)    GFR calc non Af Amer 48 (*)    GFR calc Af Amer 56 (*)    All other components within normal limits  CBC - Abnormal; Notable for the following components:   Hemoglobin 11.8 (*)    RDW 16.1 (*)    All other components within normal limits  BRAIN NATRIURETIC PEPTIDE - Abnormal; Notable for the following components:   B Natriuretic Peptide 104.4 (*)    All other components within normal limits  HEPATIC FUNCTION PANEL - Abnormal; Notable for the following components:   Albumin 3.3 (*)    Bilirubin, Direct 0.3 (*)    All other components within normal limits  SARS CORONAVIRUS 2 BY RT PCR (HOSPITAL ORDER, [place] [facility])  MAGNESIUM  TROPONIN I (HIGH SENSITIVITY)    EKG EKG Interpretation  Date/Time:  Tuesday April 01 2020 11:56:34 EDT Ventricular Rate:  108 PR Interval:  118 QRS Duration: 130 QT Interval:  366 QTC Calculation: 490 R Axis:   112 Text Interpretation: Sinus tachycardia with Premature atrial complexes Right axis deviation Left ventricular hypertrophy with QRS widening and repolarization abnormality ( Cornell product ) Cannot rule out Anteroseptal  infarct , age undetermined Abnormal ECG Since last tracing rate faster Confirmed by Noemi Chapel [phone]) on 04/01/2020 4:09:40  PM   Radiology DG Chest 2 View  Result Date: 04/01/2020 CLINICAL DATA:  Shortness of breath. EXAM: CHEST - 2 VIEW COMPARISON:  March 27, 2020. FINDINGS: Stable cardiomediastinal silhouette. No pneumothorax is noted. Bibasilar subsegmental atelectasis is noted with small pleural effusions. Stable right rib fractures are noted. IMPRESSION: Bibasilar subsegmental atelectasis with small pleural effusions. Electronically Signed   By: Marijo Conception M.D.   On: 04/01/2020 12:45    Procedures Procedures (including critical care time)  Medications Ordered in ED Medications  furosemide (LASIX) injection 40 mg (40 mg Intramuscular Given 04/01/20 1748)  alum & mag hydroxide-simeth (MAALOX/MYLANTA) 200-200-20 MG/5ML suspension 30 mL (30 mLs Oral Given 04/01/20 1748)    And  lidocaine (XYLOCAINE) 2 % viscous mouth solution 15 mL (15 mLs Oral Given 04/01/20 1748)  pantoprazole (PROTONIX) EC tablet 40 mg (40 mg Oral Given 04/01/20 2136)    ED Course  I have reviewed the triage vital signs and the nursing notes.  Pertinent labs & imaging results that were available during my care of the patient were reviewed by me and considered in my medical decision making (see chart for details).  Patient is 84 year old female with past medical history detailed above presented today for shortness of breath.  This is been an ongoing issue for her she has had several rib fractures after a fall that was thought to be likely secondary to hyponatremia she was treated for this during hospital stay recently.  Reviewed all patient's recent imaging which includes a CT PE study which was negative which was done within the past month.  She does have stable/slightly improved pleural effusions from prior imaging.  I reviewed x-ray images with my attending physician Dr. Sabra Heck and we agree that patient's oxygen saturations, chest x-ray and overall appearance is improved from prior visits.  Physical exam patient is notably anxious  but appears in no acute distress.  She was mildly tachycardic at times but this seems to have resolved after GI cocktail.  Also given 40 mg of Lasix IM.   BMP is only mildly elevated at 104 improved from prior.  BMP with sodium of 131 which is also improved from prior.  Kidney function stable.  Troponin next to stable.  No acute EKG changes.  Magnesium within normal this.  Covid test is negative.  LFTs are within normal limits and CBC without leukocytosis or significant new anemia.  Patient daughter aware of plan to discharge home with increased dose of Lasix.  She will take her 40 mg morning dose as well as an additional 20 mg dose in the evenings.  This was reviewed and teach back was given successfully.  Clinical Course as of Apr 02 954  Tue Apr 01, 2020  1549 IMPRESSION: Bibasilar subsegmental atelectasis with small pleural effusions.     [WF]    Clinical Course User Index [WF] Tedd Sias, PA   This was a shared patient encounter with Dr. Sabra Heck.  MDM Rules/Calculators/A&P                          The medical records were personally reviewed by myself. I personally reviewed all lab results and interpreted all imaging studies and either concurred with their official read or contacted radiology for clarification. Additional history obtained from old records and family member.  This patient appears reasonably  screened and I doubt any other medical condition requiring further workup, evaluation, or treatment in the ED at this time prior to discharge.   Patient's vitals are WNL apart from vital sign abnormalities discussed above, patient is in NAD, and able to ambulate in the ED at their baseline and able to tolerate PO.  Pain has been managed or a plan has been made for home management and has no complaints prior to discharge. Patient is comfortable with above plan and for discharge at this time. All questions were answered prior to disposition. Results from the ER workup discussed  with the patient face to face and all questions answered to the best of my ability. The patient is safe for discharge with strict return precautions. Patient appears safe for discharge with appropriate follow-up. Conveyed my impression with the patient and they voiced understanding and are agreeable to plan.   An After Visit Summary was printed and given to the patient.  Portions of this note were generated with [employer]. Dictation errors may occur despite best attempts at proofreading.   Final Clinical Impression(s) / ED Diagnoses Final diagnoses:  Pleural effusion  Shortness of breath  Hyponatremia    Rx / DC Orders ED Discharge Orders         Ordered    furosemide (LASIX) 20 MG tablet  Nightly        04/01/20 1732           Pati Gallo [place], [place] 04/02/20 0370    Noemi Chapel, MD 04/03/20 1456

## 2020-04-01 NOTE — ED Notes (Signed)
Pt discharged to [facility] with [place].

## 2020-04-01 NOTE — ED Triage Notes (Signed)
Pt bib [facility] from rehab for hypoxia and SOB. Pt always on 3L [place]. Pt had recent rib fractures.   Pt a.o, denies pain. RR30 99% on 3L

## 2020-04-01 NOTE — Discharge Instructions (Signed)
Your chest x-ray has shown some gradual improvement today from prior.  Please continue to use either salt tablets or increase her dietary sodium with salty foods.  Please follow the recommendations of your nephrologist/kidney doctor and your primary care doctor. We are increasing your fluid pill/Lasix today.  You are already taking 40 mg every morning please use the additional Lasix that I prescribed in the evening.  This will be a 20 mg dose.  Therefore every morning you will be taking one 40 mg tablet and every evening you will be taking one 20 mg tablet  Please continue to monitor symptoms.  You may always return to the emergency department for reevaluation however these symptoms should gradually improve over time.  It is highly important that you have your electrolytes including your sodium and potassium rechecked in 1 week when you have your follow-up appointment with your primary care doctor at which time you should also have a chest x-ray done.

## 2020-04-01 NOTE — ED Provider Notes (Signed)
Medical screening examination/treatment/procedure(s) were conducted as a shared visit with non-physician practitioner(s) and myself.  I personally evaluated the patient during the encounter.  Clinical Impression:   Final diagnoses:  Pleural effusion  Shortness of breath  Hyponatremia    This patient is a pleasant 84 year old female who unfortunately had suffered some hyponatremia that led to a fall causing some rib fractures about a month ago, she had been admitted to the hospital, treated for the hyponatremia, she has been on fluid restriction and had some Lasix added as well.  She was found to have pleural effusions about a week ago when she was seen for shortness of breath, she has been taking her medications including the furosemide but come back today still feeling like she has some shortness of breath.  She has been placed on 2 L at her nursing facility.  On exam her lungs are clear, she is mildly tachypneic but is able to speak in generally full sentences.  She has scant pitting edema around the ankles and had a recent echocardiogram which showed a normal ejection fraction which was overall unremarkable.  X-ray today shows slightly improved effusions compared to prior according to my interpretation.  The rib fractures are visible, there is no obvious pneumothorax, she is not tachycardic or hypotensive or febrile and has not been having any fevers or significant coughing at home.  She is in a nursing facility where she has adequate oversight.  I will recommend slightly increasing the fluids as the patient states she has a severely dry mouth, she will need to push Ensure shakes or other substitute of meals as she has not had a very good appetite.  She will need a repeat x-ray in 1 week with repeat sodium levels.  I spoke to the patient and the daughter, they are in agreement with the plan.  I do not think the patient needs to be admitted to the hospital at this time.  She likely will take some  time to heal and get rid of these pleural effusions which are likely not hemothorax nor empyema.   Noemi Chapel, MD 04/03/20 [phone]

## 2020-04-01 NOTE — ED Notes (Signed)
Pt spo2 91-94% on RA

## 2020-04-04 ENCOUNTER — Non-Acute Institutional Stay (SKILLED_NURSING_FACILITY): Payer: Medicare PPO | Admitting: Internal Medicine

## 2020-04-04 ENCOUNTER — Encounter: Payer: Self-pay | Admitting: Internal Medicine

## 2020-04-04 DIAGNOSIS — S43101A Unspecified dislocation of right acromioclavicular joint, initial encounter: Secondary | ICD-10-CM

## 2020-04-04 DIAGNOSIS — E871 Hypo-osmolality and hyponatremia: Secondary | ICD-10-CM

## 2020-04-04 DIAGNOSIS — F411 Generalized anxiety disorder: Secondary | ICD-10-CM

## 2020-04-04 DIAGNOSIS — Z66 Do not resuscitate: Secondary | ICD-10-CM | POA: Diagnosis not present

## 2020-04-04 DIAGNOSIS — J90 Pleural effusion, not elsewhere classified: Secondary | ICD-10-CM

## 2020-04-04 DIAGNOSIS — S2241XA Multiple fractures of ribs, right side, initial encounter for closed fracture: Secondary | ICD-10-CM

## 2020-04-04 DIAGNOSIS — J9601 Acute respiratory failure with hypoxia: Secondary | ICD-10-CM | POA: Diagnosis not present

## 2020-04-04 LAB — BASIC METABOLIC PANEL
BUN: 12 (ref 4–21)
CO2: 30 — AB (ref 13–22)
Chloride: 88 — AB (ref 99–108)
Creatinine: 0.9 (ref 0.5–1.1)
Glucose: 82
Potassium: 3.6 (ref 3.4–5.3)
Sodium: 131 — AB (ref 137–147)

## 2020-04-04 LAB — COMPREHENSIVE METABOLIC PANEL
Calcium: 8.4 — AB (ref 8.7–10.7)
GFR calc Af Amer: 71.26
GFR calc non Af Amer: 61.49

## 2020-04-04 MED ORDER — ALPRAZOLAM 0.25 MG PO TABS: 0.2500 mg | ORAL_TABLET | Freq: Two times a day (BID) | ORAL | 0 refills | Status: DC

## 2020-04-04 NOTE — Progress Notes (Signed)
Provider:  Rexene Edison. Mariea Clonts, D.O., C.M.D. Location:  [place] Room Number: [place] of Service:  SNF ([phone])  PCP: Ronnald Nian, DO Patient Care Team: Ronnald Nian, DO as PCP - General (Family Medicine) Michael Boston, MD as Consulting Physician (General Surgery) Milus Banister, MD as Attending Physician (Gastroenterology)  Extended Emergency Contact Information Primary Emergency Contact: Irving [place] Phone: [phone] Relation: Sister Secondary Emergency Contact: [facility] Mobile Phone: [phone] Relation: Daughter  Code Status: DNR when discussed with patient today Goals of Care: Advanced Directive information Advanced Directives 03/31/2020  Does Patient Have a Medical Advance Directive? No  Type of Advance Directive -  Does patient want to make changes to medical advance directive? No - Patient declined  Copy of [place] in Chart? -      Chief Complaint  Patient presents with  . Readmit To SNF    Readmit     HPI: Patient is a 84 y.o. female seen today for readmission to [facility] status post hospitalization for hypoxia.  She has a medical history significant for hypertension, hypothyroidism, senile osteoporosis, laparoscopic intestinal resection in 2016, chronic diarrhea and prior admission here for right-sided rib fractures 7 through 9 Klebsiella UTI and severe symptomatic hyponatremia to 109.  She had been completing her rehab here and her sodium had improved considerably up to up over 130 on her labs here.  Unfortunately on September 7 nursing staff was unable to get her oxygen saturation above 88% despite 3 L by nasal cannula.  She complained of shortness of breath and was transported to the emergency department.  There her sats were 84% on room air and 91% on 3 L via nasal cannula.  Sodium was 131.  Her chest x-ray revealed moderate bilateral pleural  effusions with basilar atelectasis and multiple displaced right rib fractures as previously seen.  Her CTA of the chest revealed moderate right pleural effusion, small left pleural effusion, right lower lobe volume loss, and groundglass representing edema versus atelectasis.  She also continued to have right posterior ribs 6-11 rib fractures without pneumothorax, exaggerated thoracic kyphosis.  No pulmonary embolism was found.  Blood cultures were drawn and she was given a dose of IV vancomycin and cefepime.  She was then admitted and placed on incentive spirometry given a dose of Lasix 40 mg IV, and therapy was continued.  It appears she had a course of Augmentin.  An echocardiogram was also ordered.  She was continued on salt tabs and fluid restriction for her hyponatremia with hydrochlorothiazide and lisinopril remaining on hold as it was a here at the facility.  CODE STATUS at the hospital was DO NOT RESUSCITATE.  Her acute respiratory failure was determined to be multifactorial including her rib fractures pleural effusion (with diuretic on hold).  She was gently diuresed.  Echocardiogram from 9 7 showed preserved ejection fraction of 55 to 60% no wall motion abnormalities, could not comment on diastolic dysfunction, and no evidence of aortic stenosis.  INO's and weights were monitored.  Further Lasix titration was recommended outpatient.  Of note her BNP on September 7 was only 308.  Unfortunately her stay was complicated by intermittent recurrent acute urinary retention on the ninth.  She is to follow-up with urology outpatient.  Regarding her hyponatremia she was continued on her fluid restriction but also diuresed and her sodium appeared to be stabilized and sodium tablets were discontinued.  Discharge sodium on September 10 was 132.  She also had mild transaminitis which is to be monitored.  Discharge AST on the ninth was 57, ALT 47, alk phos 99.  Her Covid test was negative on September 7 as well  as September 10 and she has been vaccinated with the [place] vaccine January 21 and February 21.  When seen, she remained anxious about her breathing but did not appear short of breath.  She continues with pin in her right shoulder and ribs.    Fortunately, Na here remains stable at 131.    I discussed with her the discrepancy with her code status b/w her admission at the hospital where she had DNR status and here where she was back to being full code.  At first, she said she wants to live, but when I explained what CPR would mean:  Compressions on her chest (when she already has broken ribs and shortness of breath), shocks and possibly being put on a breathing machine, she said she did not want all of that.    Past Medical History:  Diagnosis Date  . Anemia    on iron now  . Cataract   . Hypertension   . Hypothyroidism   . Osteoporosis   . PONV (postoperative nausea and vomiting)   . Thyroid disease   . Varicose vein    Past Surgical History:  Procedure Laterality Date  . ABDOMINAL HYSTERECTOMY  1975   partial  . APPENDECTOMY  1975   with hysterectomy  . HEMORROIDECTOMY     [phone]  . LAPAROSCOPIC SMALL BOWEL RESECTION N/A 11/29/2014   Procedure: LAPAROSCOPIC SMALL INTESTINE RESECTION;  Surgeon: Michael Boston, MD;  Location: [facility];  Service: General;  Laterality: N/A;  . VARICOSE VEIN SURGERY Left 1971    Social History   Socioeconomic History  . Marital status: Widowed    Spouse name: Not on file  . Number of children: Not on file  . Years of education: Not on file  . Highest education level: Not on file  Occupational History  . Occupation: Retired  Tobacco Use  . Smoking status: Never Smoker  . Smokeless tobacco: Never Used  Vaping Use  . Vaping Use: Never used  Substance and Sexual Activity  . Alcohol use: No    Alcohol/week: 0.0 standard drinks  . Drug use: No  . Sexual activity: Not Currently  Other Topics Concern  . Not on file  Social History  Narrative  . Not on file   Social Determinants of Health   Financial Resource Strain: Low Risk   . Difficulty of Paying Living Expenses: Not hard at all  Food Insecurity: No Food Insecurity  . Worried About Charity fundraiser in the Last Year: Never true  . Ran Out of Food in the Last Year: Never true  Transportation Needs: No Transportation Needs  . Lack of Transportation (Medical): No  . Lack of Transportation (Non-Medical): No  Physical Activity: Inactive  . Days of Exercise per Week: 0 days  . Minutes of Exercise per Session: 0 min  Stress: No Stress Concern Present  . Feeling of Stress : Not at all  Social Connections: Moderately Integrated  . Frequency of Communication with Friends and Family: More than three times a week  . Frequency of Social Gatherings with Friends and Family: More than three times a week  . Attends Religious Services: More than 4 times per year  . Active Member of Clubs or Organizations: Yes  .  Attends Archivist Meetings: More than 4 times per year  . Marital Status: Widowed    reports that she has never smoked. She has never used smokeless tobacco. She reports that she does not drink alcohol and does not use drugs.  Functional Status Survey:    Family History  Problem Relation Age of Onset  . Bone cancer Mother   . Diabetes Father   . Stroke Father   . Breast cancer Sister   . Stomach cancer Neg Hx   . Colon cancer Neg Hx     Health Maintenance  Topic Date Due  . INFLUENZA VACCINE  02/17/2020  . TETANUS/TDAP  05/30/2028  . DEXA SCAN  Completed  . COVID-19 Vaccine  Completed  . PNA vac Low Risk Adult  Completed    Allergies  Allergen Reactions  . Codeine Nausea Only    REACTION: nausea  . Sulfamethoxazole Rash    REACTION: rash    Outpatient Encounter Medications as of 04/04/2020  Medication Sig  . acetaminophen (TYLENOL) 325 MG tablet Take 2 tablets (650 mg total) by mouth every 6 (six) hours as needed for mild pain,  fever or headache. (Patient not taking: Reported on 04/01/2020)  . acetaminophen (TYLENOL) 500 MG tablet Take 500 mg by mouth every 6 (six) hours as needed for moderate pain.  Marland Kitchen ALPRAZolam (XANAX) 0.25 MG tablet Take 1 tablet (0.25 mg total) by mouth 2 (two) times daily.  Marland Kitchen amLODipine (NORVASC) 10 MG tablet Take 1 tablet (10 mg total) by mouth daily.  . bisacodyl (DULCOLAX) 10 MG suppository Place 10 mg rectally as needed for moderate constipation.  . cycloSPORINE (RESTASIS) 0.05 % ophthalmic emulsion Place 1 drop into both eyes 2 (two) times daily.   . furosemide (LASIX) 20 MG tablet Take 1 tablet (20 mg total) by mouth at bedtime for 14 days.  . furosemide (LASIX) 40 MG tablet Take 1 tablet (40 mg total) by mouth daily.  . hydrALAZINE (APRESOLINE) 25 MG tablet Take 1 tablet (25 mg total) by mouth every 8 (eight) hours.  Marland Kitchen levothyroxine (SYNTHROID) 50 MCG tablet TAKE 1 TABLET BY MOUTH EVERY MORNING  . lidocaine (XYLOCAINE) 2 % solution Use as directed 15 mLs in the mouth or throat every 4 (four) hours as needed for mouth pain.  Marland Kitchen ondansetron (ZOFRAN-ODT) 4 MG disintegrating tablet Take 4 mg by mouth daily as needed for nausea.  Marland Kitchen oxyCODONE (OXY IR/ROXICODONE) 5 MG immediate release tablet Take 1 tablet (5 mg total) by mouth every 4 (four) hours as needed for moderate pain.  . pantoprazole (PROTONIX) 40 MG tablet Take 40 mg by mouth daily.  . polyethylene glycol (MIRALAX / GLYCOLAX) 17 g packet Take 17 g by mouth daily as needed for mild constipation.  . polyvinyl alcohol (LIQUIFILM TEARS) 1.4 % ophthalmic solution Place 1 drop into both eyes as needed for dry eyes. (Patient not taking: Reported on 04/01/2020)  . potassium chloride SA (KLOR-CON) 20 MEQ tablet Take 2 tablets (40 mEq total) by mouth daily.  . sodium chloride 1 g tablet Take 2 g by mouth 2 (two) times daily with a meal.  . traMADol (ULTRAM) 50 MG tablet Take 50 mg by mouth every 6 (six) hours as needed for moderate pain.  .  [DISCONTINUED] hydrOXYzine (ATARAX/VISTARIL) 10 MG tablet Take 1 tablet (10 mg total) by mouth 3 (three) times daily as needed for anxiety.   No facility-administered encounter medications on file as of 04/04/2020.    Review of Systems  Constitutional: Positive for malaise/fatigue. Negative for chills and fever.  HENT: Negative for congestion and sore throat.   Eyes: Negative for blurred vision.  Respiratory: Positive for shortness of breath. Negative for cough and sputum production.   Cardiovascular: Positive for orthopnea. Negative for chest pain, palpitations, leg swelling and PND.  Gastrointestinal: Negative for abdominal pain and constipation.  Genitourinary: Negative for dysuria.  Musculoskeletal: Positive for joint pain. Negative for falls.       Rib pain  Skin: Negative for itching and rash.  Neurological: Positive for weakness. Negative for dizziness and loss of consciousness.  Psychiatric/Behavioral: The patient is nervous/anxious. The patient does not have insomnia.     Vitals:   04/04/20 1627  BP: 127/79  Pulse: 93  Weight: 140 lb (63.5 kg)  Height: _0  (1.702 m)   Body mass index is 21.93 kg/m. Physical Exam Vitals reviewed.  Constitutional:      General: She is not in acute distress.    Appearance: Normal appearance. She is ill-appearing. She is not toxic-appearing.  HENT:     Head: Normocephalic and atraumatic.     Right Ear: External ear normal.     Left Ear: External ear normal.     Nose: Nose normal.     Mouth/Throat:     Pharynx: Oropharynx is clear.  Eyes:     Conjunctiva/sclera: Conjunctivae normal.     Pupils: Pupils are equal, round, and reactive to light.  Cardiovascular:     Rate and Rhythm: Normal rate and regular rhythm.     Pulses: Normal pulses.     Heart sounds: Normal heart sounds. No murmur heard.   Pulmonary:     Effort: Pulmonary effort is normal.     Comments: diminished breath sounds at bases Abdominal:     General: Bowel  sounds are normal. There is no distension.     Palpations: Abdomen is soft.     Tenderness: There is no abdominal tenderness. There is no guarding or rebound.  Musculoskeletal:     Cervical back: Neck supple.     Right lower leg: No edema.     Left lower leg: No edema.     Comments: Limited shoulder ROM  Skin:    Coloration: Skin is pale.  Neurological:     General: No focal deficit present.     Mental Status: She is alert. Mental status is at baseline.     Motor: Weakness present.     Gait: Gait abnormal.  Psychiatric:        Mood and Affect: Mood normal.     Comments: anxious     Labs reviewed: Basic Metabolic Panel: Recent Labs    04/16/19 0912 11/14/19 1547 03/07/20 0719 03/07/20 1407 03/27/20 0522 03/27/20 0522 03/28/20 0504 03/31/20 0000 04/01/20 1213  NA 133*   < > 121*  120*   < > 130*   < > 132* 131* 131*  K 4.0   < > 3.8   < > 3.7   < > 3.3* 3.9 4.4  CL 102   < > 92*   < > 100   < > 97* 91* 91*  CO2 21   < > 18*   < > 21*   < > 25 29* 26  GLUCOSE 94   < > 107*   < > 101*  --  87  --  98  BUN 11   < > 11   < > 13   < > 15  12 14  CREATININE 1.01*   < > 0.90   < > 0.73   < > 0.88 1.0 1.08*  CALCIUM 8.9   < > 7.9*   < > 8.3*   < > 8.2* 8.8 8.9  MG  --    < >  --    < > 2.0  --  2.0  --  1.7  PHOS 3.7  --  2.2*  --   --   --   --   --   --    < > = values in this interval not displayed.   Liver Function Tests: Recent Labs    03/05/20 0913 03/05/20 0913 03/07/20 0719 03/27/20 0522 04/01/20 1213  AST 74*  --   --  57* 37  ALT 35  --   --  47* 30  ALKPHOS 64  --   --  99 97  BILITOT 1.3*  --   --  1.0 1.1  PROT 8.3*  --   --  6.5 7.0  ALBUMIN 4.3   < > 3.2* 3.3* 3.3*   < > = values in this interval not displayed.   Recent Labs    03/05/20 0913  LIPASE 31   No results for input(s): AMMONIA in the last 8760 hours. CBC: Recent Labs    03/09/20 0628 03/09/20 0628 03/10/20 0738 03/10/20 0738 03/25/20 0415 03/25/20 0418 03/26/20 0502  03/26/20 0502 03/27/20 0522 03/31/20 0000 04/01/20 1213  WBC 10.5   < > 7.7   < > 6.0   < > 6.2   < > 6.5 5.0 10.3  NEUTROABS 7.5  --  4.7  --  4.5  --   --   --   --   --   --   HGB 10.5*   < > 10.4*   < > 10.4*   < > 10.1*   < > 10.7* 11.3* 11.8*  HCT 29.8*   < > 29.3*   < > 31.4*   < > 31.5*   < > 33.1* 33* 36.9  MCV 87.1   < > 87.5   < > 92.6   < > 93.8  --  94.6  --  93.7  PLT 188   < > 214   < > 326   < > 303   < > 341 256 333   < > = values in this interval not displayed.   Cardiac Enzymes: No results for input(s): CKTOTAL, CKMB, CKMBINDEX, TROPONINI in the last 8760 hours. BNP: Invalid input(s): POCBNP No results found for: HGBA1C Lab Results  Component Value Date   TSH 8.611 (H) 03/05/2020   Lab Results  Component Value Date   GUYQIHKV42 595 04/16/2019   Lab Results  Component Value Date   FOLATE 14.3 01/22/2019   Lab Results  Component Value Date   FERRITIN 59.4 04/16/2019    Imaging and Procedures obtained prior to SNF admission: DG Chest 2 View  Result Date: 04/01/2020 CLINICAL DATA:  Shortness of breath. EXAM: CHEST - 2 VIEW COMPARISON:  March 27, 2020. FINDINGS: Stable cardiomediastinal silhouette. No pneumothorax is noted. Bibasilar subsegmental atelectasis is noted with small pleural effusions. Stable right rib fractures are noted. IMPRESSION: Bibasilar subsegmental atelectasis with small pleural effusions. Electronically Signed   By: Marijo Conception M.D.   On: 04/01/2020 12:45    Assessment/Plan 1. Acute respiratory failure with hypoxia (HCC) -continue oxygen via [place] to keep sats over 90%  2. Pleural effusion -  improved with diuresis at hospital--now on lasix 73m daily, f/u bmp in a week  3. Generalized anxiety disorder -will schedule her xanax 0.272mpo bid (before therapy and at hs)  4. Hyponatremia -Na 131 when checked since readmit--stable, repeat in one week  5. DNR (do not resuscitate) -when I met with her today, she requested NOT to  have CPR  6. AC separation, right, initial encounter -was to use sling, ROM limited  7. Closed fracture of multiple ribs of right side, initial encounter -recovering slowly, had some atelectasis despite IS use -still has percocet for pain which needs to be weaned off. -cont tylenol  Family/ staff Communication: discussed with snf nurse  Labs/tests ordered:  Cbc, bmp already done from readmission  Draycen Leichter L. Julia Kulzer, D.O. [facility] [address] [place] [address] Phone (Mon-Fri 8am-5pm):  [phone] Call:  [phone] follow prompts after 5pm & weekends Office Phone:  [phone] Fax:  [phone]

## 2020-04-11 LAB — COMPREHENSIVE METABOLIC PANEL
Calcium: 8.5 — AB (ref 8.7–10.7)
GFR calc Af Amer: 75.38
GFR calc non Af Amer: 65.04

## 2020-04-11 LAB — BASIC METABOLIC PANEL
BUN: 11 (ref 4–21)
CO2: 36 — AB (ref 13–22)
Chloride: 86 — AB (ref 99–108)
Creatinine: 0.8 (ref 0.5–1.1)
Glucose: 90
Potassium: 2.9 — AB (ref 3.4–5.3)
Sodium: 131 — AB (ref 137–147)

## 2020-04-11 LAB — TSH: TSH: 6.73 — AB (ref 0.41–5.90)

## 2020-04-13 DIAGNOSIS — R2681 Unsteadiness on feet: Secondary | ICD-10-CM | POA: Diagnosis not present

## 2020-04-13 DIAGNOSIS — M6281 Muscle weakness (generalized): Secondary | ICD-10-CM | POA: Diagnosis not present

## 2020-04-13 DIAGNOSIS — S2241XD Multiple fractures of ribs, right side, subsequent encounter for fracture with routine healing: Secondary | ICD-10-CM | POA: Diagnosis not present

## 2020-04-13 DIAGNOSIS — R2689 Other abnormalities of gait and mobility: Secondary | ICD-10-CM | POA: Diagnosis not present

## 2020-04-14 ENCOUNTER — Non-Acute Institutional Stay (SKILLED_NURSING_FACILITY): Payer: Medicare PPO | Admitting: Family

## 2020-04-14 ENCOUNTER — Encounter: Payer: Self-pay | Admitting: Family

## 2020-04-14 DIAGNOSIS — E871 Hypo-osmolality and hyponatremia: Secondary | ICD-10-CM

## 2020-04-14 DIAGNOSIS — E876 Hypokalemia: Secondary | ICD-10-CM

## 2020-04-14 DIAGNOSIS — M6281 Muscle weakness (generalized): Secondary | ICD-10-CM | POA: Diagnosis not present

## 2020-04-14 DIAGNOSIS — E039 Hypothyroidism, unspecified: Secondary | ICD-10-CM

## 2020-04-14 DIAGNOSIS — S2241XD Multiple fractures of ribs, right side, subsequent encounter for fracture with routine healing: Secondary | ICD-10-CM | POA: Diagnosis not present

## 2020-04-14 DIAGNOSIS — R2689 Other abnormalities of gait and mobility: Secondary | ICD-10-CM | POA: Diagnosis not present

## 2020-04-14 DIAGNOSIS — R2681 Unsteadiness on feet: Secondary | ICD-10-CM | POA: Diagnosis not present

## 2020-04-14 NOTE — Progress Notes (Signed)
Location:    [place].   Nursing Home Room Number: 568-L Place of Service:  SNF (31) Provider:  Marlowe Sax, NP    Patient Care Team: Ronnald Nian, DO as PCP - General (Family Medicine) Michael Boston, MD as Consulting Physician (General Surgery) Milus Banister, MD as Attending Physician (Gastroenterology)  Extended Emergency Contact Information Primary Emergency Contact: Irving Burton [place] Phone: [phone] Relation: Sister Secondary Emergency Contact: [facility] Mobile Phone: [phone] Relation: Daughter  Code Status: Full Code  Goals of care: Advanced Directive information Advanced Directives 04/14/2020  Does Patient Have a Medical Advance Directive? Yes  Type of Advance Directive Out of facility DNR (pink MOST or yellow form)  Does patient want to make changes to medical advance directive? No - Patient declined  Copy of [place] in Chart? -     Chief Complaint  Patient presents with  . Acute Visit    Abnormal Labs.    HPI:  Pt is a 84 y.o. female seen today for an acute visit for evaluation of abnormal lab results.she is seen in her room sitting on her wheelchair.she states was moved to another room due to insurance coverage for rehab.States likes her new room but needs someone to orient her to the room and needs a clock.Facility Nurse notified. Her lab results showed Na + 131,K+ 2.9 and TSH level 6.73 previous potassium was within normal range.she is on Furosemide 40 mg tablet daily with potassium chloride 40 meq daily.Previous Na+ 131 was on sodium chloride 2 Gm twice daily with meals. She denies any acute issue.   Past Medical History:  Diagnosis Date  . Anemia    on iron now  . Cataract   . Hypertension   . Hypothyroidism   . Osteoporosis   . PONV (postoperative nausea and vomiting)   . Thyroid disease   . Varicose vein    Past Surgical History:  Procedure Laterality Date  .  ABDOMINAL HYSTERECTOMY  1975   partial  . APPENDECTOMY  1975   with hysterectomy  . HEMORROIDECTOMY     [phone]  . LAPAROSCOPIC SMALL BOWEL RESECTION N/A 11/29/2014   Procedure: LAPAROSCOPIC SMALL INTESTINE RESECTION;  Surgeon: Michael Boston, MD;  Location: [facility];  Service: General;  Laterality: N/A;  . VARICOSE VEIN SURGERY Left 1971    Allergies  Allergen Reactions  . Codeine Nausea Only    REACTION: nausea  . Sulfamethoxazole Rash    REACTION: rash    Allergies as of 04/14/2020      Reactions   Codeine Nausea Only   REACTION: nausea   Sulfamethoxazole Rash   REACTION: rash      Medication List       Accurate as of April 14, 2020  3:41 PM. If you have any questions, ask your nurse or doctor.        STOP taking these medications   oxyCODONE 5 MG immediate release tablet Commonly known as: Oxy IR/ROXICODONE Stopped by: Nelda Bucks Lachrista Heslin, NP   sodium chloride 1 g tablet Stopped by: Sandrea Hughs, NP     TAKE these medications   acetaminophen 325 MG tablet Commonly known as: TYLENOL Take 2 tablets (650 mg total) by mouth every 6 (six) hours as needed for mild pain, fever or headache.   ALPRAZolam 0.25 MG tablet Commonly known as: XANAX Take 0.25 mg by mouth at bedtime. What changed: Another medication with the same name was removed. Continue  taking this medication, and follow the directions you see here. Changed by: Sandrea Hughs, NP   bisacodyl 10 MG suppository Commonly known as: DULCOLAX Place 10 mg rectally as needed for moderate constipation.   cycloSPORINE 0.05 % ophthalmic emulsion Commonly known as: RESTASIS Place 1 drop into both eyes 2 (two) times daily.   furosemide 40 MG tablet Commonly known as: LASIX Take 1 tablet (40 mg total) by mouth daily.   hydrALAZINE 25 MG tablet Commonly known as: APRESOLINE Take 1 tablet (25 mg total) by mouth every 8 (eight) hours.   levothyroxine 50 MCG tablet Commonly known as: SYNTHROID TAKE  1 TABLET BY MOUTH EVERY MORNING   lidocaine 2 % solution Commonly known as: XYLOCAINE Use as directed 15 mLs in the mouth or throat every 4 (four) hours as needed for mouth pain.   MILK OF MAGNESIA PO Take 30 mLs by mouth as needed.   MiraLax 17 g packet Generic drug: polyethylene glycol Take 17 g by mouth as needed.   Norvasc 10 MG tablet Generic drug: amLODipine Take 10 mg by mouth daily.   ondansetron 4 MG disintegrating tablet Commonly known as: ZOFRAN-ODT Take 4 mg by mouth daily as needed for nausea.   pantoprazole 40 MG tablet Commonly known as: PROTONIX Take 40 mg by mouth daily.   polyvinyl alcohol 1.4 % ophthalmic solution Commonly known as: LIQUIFILM TEARS Place 1 drop into both eyes as needed for dry eyes.   potassium chloride SA 20 MEQ tablet Commonly known as: KLOR-CON Take 2 tablets (40 mEq total) by mouth daily.   RA SALINE ENEMA RE Place rectally as needed.       Review of Systems  Constitutional: Negative for appetite change, chills, fatigue and fever.  Respiratory: Negative for cough, chest tightness, shortness of breath and wheezing.   Cardiovascular: Positive for leg swelling. Negative for chest pain and palpitations.  Gastrointestinal: Negative for abdominal distention, abdominal pain, constipation, diarrhea, nausea and vomiting.  Genitourinary: Negative for difficulty urinating, flank pain, frequency and urgency.  Musculoskeletal: Positive for gait problem. Negative for joint swelling and myalgias.  Skin: Negative for color change, pallor and rash.  Neurological: Negative for dizziness, speech difficulty, light-headedness, numbness and headaches.  Hematological: Does not bruise/bleed easily.  Psychiatric/Behavioral: Negative for agitation, behavioral problems and sleep disturbance. The patient is not nervous/anxious.     Immunization History  Administered Date(s) Administered  . Influenza Whole 07/19/2005, 04/26/2008, 04/18/2009  .  Influenza, High Dose Seasonal PF 04/11/2013, 04/03/2015  . Influenza-Unspecified 04/18/2014, 04/13/2015, 03/19/2018  . Moderna SARS-COVID-2 Vaccination 08/05/2019, 08/10/2019  . Pneumococcal Conjugate-13 09/20/2013  . Pneumococcal Polysaccharide-23 04/18/2006, 06/30/2011  . Td 12/06/2005, 05/15/2008  . Tdap 05/30/2018  . Zoster 01/03/2008  . Zoster Recombinat (Shingrix) 12/09/2017, 02/18/2018   Pertinent  Health Maintenance Due  Topic Date Due  . INFLUENZA VACCINE  02/17/2020  . DEXA SCAN  Completed  . PNA vac Low Risk Adult  Completed   Fall Risk  01/02/2020 01/15/2019 05/29/2018 10/31/2017 10/13/2016  Falls in the past year? 0 0 0 No No  Number falls in past yr: 0 0 - - -  Injury with Fall? 0 0 - - -    Vitals:   04/14/20 1507  BP: 119/62  Pulse: 85  Resp: 18  Temp: 98.2 F (36.8 C)  Weight: 140 lb (63.5 kg)  Height: 5\' 7"  (1.702 m)   Body mass index is 21.93 kg/m. Physical Exam Vitals and nursing note reviewed.  Constitutional:  General: She is not in acute distress.    Appearance: She is normal weight. She is not ill-appearing.  HENT:     Head: Normocephalic.     Mouth/Throat:     Mouth: Mucous membranes are moist.     Pharynx: Oropharynx is clear. No oropharyngeal exudate or posterior oropharyngeal erythema.  Eyes:     General: No scleral icterus.       Right eye: No discharge.        Left eye: No discharge.     Conjunctiva/sclera: Conjunctivae normal.     Pupils: Pupils are equal, round, and reactive to light.  Cardiovascular:     Rate and Rhythm: Normal rate and regular rhythm.     Pulses: Normal pulses.     Heart sounds: Normal heart sounds. No murmur heard.  No friction rub. No gallop.   Pulmonary:     Effort: Pulmonary effort is normal. No respiratory distress.     Breath sounds: Normal breath sounds. No wheezing, rhonchi or rales.  Chest:     Chest wall: No tenderness.  Abdominal:     General: Bowel sounds are normal. There is no distension.      Palpations: Abdomen is soft. There is no mass.     Tenderness: There is no abdominal tenderness. There is no right CVA tenderness, left CVA tenderness, guarding or rebound.  Musculoskeletal:        General: No swelling or tenderness.     Comments: Unsteady gait.bilateral lower extremities trace - 1+ edema   Skin:    General: Skin is warm.     Coloration: Skin is not pale.     Findings: No bruising, erythema or rash.  Neurological:     Mental Status: She is alert. Mental status is at baseline.     Cranial Nerves: No cranial nerve deficit.     Sensory: No sensory deficit.     Coordination: Coordination normal.     Gait: Gait abnormal.  Psychiatric:        Mood and Affect: Mood normal.        Speech: Speech normal.        Behavior: Behavior normal.        Cognition and Memory: Memory is impaired.     Labs reviewed: Recent Labs    04/16/19 0912 11/14/19 1547 03/07/20 0719 03/07/20 1407 03/27/20 0522 03/27/20 0522 03/28/20 0504 03/31/20 0000 04/01/20 1213 04/04/20 0000 04/11/20 0000  NA 133*   < > 121*  120*   < > 130*   < > 132*   < > 131* 131* 131*  K 4.0   < > 3.8   < > 3.7   < > 3.3*   < > 4.4 3.6 2.9*  CL 102   < > 92*   < > 100   < > 97*   < > 91* 88* 86*  CO2 21   < > 18*   < > 21*   < > 25   < > 26 30* 36*  GLUCOSE 94   < > 107*   < > 101*  --  87  --  98  --   --   BUN 11   < > 11   < > 13   < > 15   < > 14 12 11   CREATININE 1.01*   < > 0.90   < > 0.73   < > 0.88   < > 1.08* 0.9 0.8  CALCIUM  8.9   < > 7.9*   < > 8.3*   < > 8.2*   < > 8.9 8.4* 8.5*  MG  --    < >  --    < > 2.0  --  2.0  --  1.7  --   --   PHOS 3.7  --  2.2*  --   --   --   --   --   --   --   --    < > = values in this interval not displayed.   Recent Labs    03/05/20 0913 03/05/20 0913 03/07/20 0719 03/27/20 0522 04/01/20 1213  AST 74*  --   --  57* 37  ALT 35  --   --  47* 30  ALKPHOS 64  --   --  99 97  BILITOT 1.3*  --   --  1.0 1.1  PROT 8.3*  --   --  6.5 7.0  ALBUMIN 4.3   < >  3.2* 3.3* 3.3*   < > = values in this interval not displayed.   Recent Labs    03/09/20 0628 03/09/20 0628 03/10/20 0738 03/10/20 0738 03/25/20 0415 03/25/20 0418 03/26/20 0502 03/26/20 0502 03/27/20 0522 03/31/20 0000 04/01/20 1213  WBC 10.5   < > 7.7   < > 6.0   < > 6.2   < > 6.5 5.0 10.3  NEUTROABS 7.5  --  4.7  --  4.5  --   --   --   --   --   --   HGB 10.5*   < > 10.4*   < > 10.4*   < > 10.1*   < > 10.7* 11.3* 11.8*  HCT 29.8*   < > 29.3*   < > 31.4*   < > 31.5*   < > 33.1* 33* 36.9  MCV 87.1   < > 87.5   < > 92.6   < > 93.8  --  94.6  --  93.7  PLT 188   < > 214   < > 326   < > 303   < > 341 256 333   < > = values in this interval not displayed.   Lab Results  Component Value Date   TSH 6.73 (A) 04/11/2020   No results found for: HGBA1C Lab Results  Component Value Date   CHOL 152 01/15/2019   HDL 78.90 01/15/2019   LDLCALC 59 01/15/2019   TRIG 71.0 01/15/2019   CHOLHDL 2 01/15/2019    Significant Diagnostic Results in last 30 days:  DG Chest 2 View  Result Date: 04/01/2020 CLINICAL DATA:  Shortness of breath. EXAM: CHEST - 2 VIEW COMPARISON:  March 27, 2020. FINDINGS: Stable cardiomediastinal silhouette. No pneumothorax is noted. Bibasilar subsegmental atelectasis is noted with small pleural effusions. Stable right rib fractures are noted. IMPRESSION: Bibasilar subsegmental atelectasis with small pleural effusions. Electronically Signed   By: Marijo Conception M.D.   On: 04/01/2020 12:45   DG Chest 2 View  Result Date: 03/25/2020 CLINICAL DATA:  Shortness of breath. Fell with fractured ribs on the right on 03/05/2020. Increasing shortness of breath and low oxygen saturation tonight. EXAM: CHEST - 2 VIEW COMPARISON:  03/05/2020 FINDINGS: Cardiac enlargement. Probable emphysematous changes in the lungs. Since the previous study, there are increasing infiltrates or atelectasis in both lung bases with developing moderate pleural effusions bilaterally. Multiple  displaced right rib fractures as seen previously. No obvious  pneumothorax. IMPRESSION: 1. Cardiac enlargement. 2. Developing moderate bilateral pleural effusions with developing basilar atelectasis or infiltration. 3. Multiple displaced right rib fractures as seen previously. Electronically Signed   By: Lucienne Capers M.D.   On: 03/25/2020 03:14   CT Angio Chest PE W and/or Wo Contrast  Result Date: 03/25/2020 CLINICAL DATA:  Chest pain. Shortness of breath. Hypoxia. Recent fall with rib fractures. EXAM: CT ANGIOGRAPHY CHEST WITH CONTRAST TECHNIQUE: Multidetector CT imaging of the chest was performed using the standard protocol during bolus administration of intravenous contrast. Multiplanar CT image reconstructions and MIPs were obtained to evaluate the vascular anatomy. CONTRAST:  1101mL OMNIPAQUE IOHEXOL 350 MG/ML SOLN COMPARISON:  Two-view chest x-ray FINDINGS: Cardiovascular: The heart size is normal. Aortic arch and great vessel origins are within normal limits. Pulmonary artery opacification is satisfactory. Pulmonary artery size is normal. No focal filling defects are present to suggest pulmonary embolus. Mediastinum/Nodes: No significant mediastinal, hilar, or axillary adenopathy is present. Lungs/Pleura: Moderate size right pleural effusion is present. Right lower lobe atelectasis is associated. Smaller left pleural effusion is present. More mild volume loss is present on the left. Fluid is present along the minor fissure with mild associated atelectasis. Minimal ground-glass attenuation is present otherwise. Upper Abdomen: . water density 2 cm cyst is present at the posterior right kidney. Visualized upper abdomen is otherwise unremarkable. Musculoskeletal: Right posterior 6-11 rib fractures are noted. No pneumothorax is present. Exaggerated thoracic kyphosis is present. Vertebral body heights are maintained. No focal lytic or blastic lesions are present. Review of the MIP images confirms the above  findings. IMPRESSION: 1. No pulmonary embolus. 2. Moderate size right pleural effusion and small left pleural effusion. 3. Right lower lobe volume loss/atelectasis. 4. Minimal volume loss at the left base. 5. Minimal scattered ground-glass attenuation likely represents edema or atelectasis. Infection is considered less likely. 6. Right posterior 6-11 rib fractures without pneumothorax. 7. Exaggerated thoracic kyphosis. Electronically Signed   By: San Morelle M.D.   On: 03/25/2020 05:03   DG Chest Port 1 View  Result Date: 03/27/2020 CLINICAL DATA:  Pleural effusion EXAM: PORTABLE CHEST 1 VIEW COMPARISON:  03/25/2020 FINDINGS: Shallow inspiration. Cardiac enlargement. Bilateral pleural effusions greater on the right. Bilateral basilar atelectasis or infiltration, also greater on the right. Similar appearance to previous study. IMPRESSION: Bilateral pleural effusions and basilar atelectasis or infiltration, greater on the right. Electronically Signed   By: Lucienne Capers M.D.   On: 03/27/2020 06:17   ECHOCARDIOGRAM COMPLETE  Result Date: 03/25/2020    ECHOCARDIOGRAM REPORT   Patient Name:   STEPFANIE YOTT Date of Exam: 03/25/2020 Medical Rec #:  [id number]       Height:       67.0 in Accession #:    [id number]      Weight:       160.0 lb Date of Birth:  04-08-1936      BSA:          1.839 m Patient Age:    51 years        BP:           145/90 mmHg Patient Gender: F               HR:           93 bpm. Exam Location:  Inpatient Procedure: 2D Echo Indications:    786.09 dyspnea  History:        Patient has no prior history of Echocardiogram examinations.  Risk Factors:Hypertension.  Sonographer:    Jannett Celestine RDCS (AE) Referring Phys: [id number] JARED E SEGAL IMPRESSIONS  1. Left ventricular ejection fraction, by estimation, is 55 to 60%. The left ventricle has normal function. The left ventricle has no regional wall motion abnormalities. Left ventricular diastolic function could not be  evaluated.  2. Right ventricular systolic function is normal. The right ventricular size is mildly enlarged.  3. The mitral valve is degernerative. There is moderate thickening of the mitral valve leaflet(s). Normal mobility of the mitral valve leaflets. Moderate mitral annular calcification.  4. The aortic valve is normal in structure. Aortic valve regurgitation is mild. No aortic stenosis is present.  5. The inferior vena cava is normal in size with greater than 50% respiratory variability, suggesting right atrial pressure of 3 mmHg. FINDINGS  Left Ventricle: Left ventricular ejection fraction, by estimation, is 55 to 60%. The left ventricle has normal function. The left ventricle has no regional wall motion abnormalities. The left ventricular internal cavity size was normal in size. There is  no left ventricular hypertrophy. Left ventricular diastolic function could not be evaluated. Right Ventricle: The right ventricular size is mildly enlarged. No increase in right ventricular wall thickness. Right ventricular systolic function is normal. Left Atrium: Left atrial size was normal in size. Right Atrium: Right atrial size was normal in size. Pericardium: There is no evidence of pericardial effusion. Mitral Valve: The mitral valve is degenerative in appearance. There is moderate thickening of the mitral valve leaflet(s). Normal mobility of the mitral valve leaflets. Moderate mitral annular calcification. Mild to moderate mitral valve regurgitation. No evidence of mitral valve stenosis. Tricuspid Valve: The tricuspid valve is normal in structure. Tricuspid valve regurgitation is mild . No evidence of tricuspid stenosis. Aortic Valve: The aortic valve is normal in structure.. There is moderate thickening and moderate calcification of the aortic valve. Aortic valve regurgitation is mild. No aortic stenosis is present. There is moderate thickening of the aortic valve. There is moderate calcification of the aortic  valve. Pulmonic Valve: The pulmonic valve was normal in structure. Pulmonic valve regurgitation is mild. No evidence of pulmonic stenosis. Aorta: The aortic root is normal in size and structure. Venous: The inferior vena cava is normal in size with greater than 50% respiratory variability, suggesting right atrial pressure of 3 mmHg. IAS/Shunts: No atrial level shunt detected by color flow Doppler.  LEFT VENTRICLE PLAX 2D LVIDd:         5.00 cm LVIDs:         3.50 cm LV PW:         1.00 cm LV IVS:        0.90 cm LVOT diam:     1.90 cm LV SV:         57 LV SV Index:   31 LVOT Area:     2.84 cm  RIGHT VENTRICLE TAPSE (M-mode): 2.7 cm LEFT ATRIUM             Index       RIGHT ATRIUM           Index LA diam:        3.30 cm 1.79 cm/m  RA Area:     11.20 cm LA Vol (A2C):   65.6 ml 35.67 ml/m RA Volume:   21.50 ml  11.69 ml/m LA Vol (A4C):   39.5 ml 21.48 ml/m LA Biplane Vol: 56.5 ml 30.72 ml/m  AORTIC VALVE LVOT Vmax:   116.00 cm/s LVOT Vmean:  79.300 cm/s LVOT VTI:    0.200 m  AORTA Ao Root diam: 2.90 cm  SHUNTS Systemic VTI:  0.20 m Systemic Diam: 1.90 cm Fransico Him MD Electronically signed by Fransico Him MD Signature Date/Time: 03/25/2020/4:23:22 PM    Final     Assessment/Plan 1. Hypokalemia K+ 2.9  - Administer Potassium chloride 40 meg tablet x 1 dose now  - continue with Potassium chloride 40 meq tablet daily  - Recheck BMP 04/15/2020   2. Hyponatremia Na+ 131 level stable compared to previous. - BMP as above.  3. Hypothyroidism, unspecified type TSH level 6.73  - will increase levothyroxine from 50 mcg tablet to 75 mcg Tablet daily on empty stomach before breakfast. - TSH level in 8 weeks   Family/ staff Communication:  Reviewed plan of care with the patient and facility   Labs/tests ordered:  BMP 04/15/2020 TSH level in 8 weeks

## 2020-04-15 DIAGNOSIS — R2689 Other abnormalities of gait and mobility: Secondary | ICD-10-CM | POA: Diagnosis not present

## 2020-04-15 DIAGNOSIS — S2241XD Multiple fractures of ribs, right side, subsequent encounter for fracture with routine healing: Secondary | ICD-10-CM | POA: Diagnosis not present

## 2020-04-15 DIAGNOSIS — M6281 Muscle weakness (generalized): Secondary | ICD-10-CM | POA: Diagnosis not present

## 2020-04-15 DIAGNOSIS — I10 Essential (primary) hypertension: Secondary | ICD-10-CM | POA: Diagnosis not present

## 2020-04-15 DIAGNOSIS — R2681 Unsteadiness on feet: Secondary | ICD-10-CM | POA: Diagnosis not present

## 2020-04-15 LAB — BASIC METABOLIC PANEL
BUN: 16 (ref 4–21)
CO2: 27 — AB (ref 13–22)
Chloride: 88 — AB (ref 99–108)
Creatinine: 1 (ref 0.5–1.1)
Glucose: 93
Potassium: 4.4 (ref 3.4–5.3)
Sodium: 129 — AB (ref 137–147)

## 2020-04-15 LAB — COMPREHENSIVE METABOLIC PANEL
Calcium: 8.7 (ref 8.7–10.7)
GFR calc Af Amer: 59.6
GFR calc non Af Amer: 51.43

## 2020-04-16 DIAGNOSIS — R2689 Other abnormalities of gait and mobility: Secondary | ICD-10-CM | POA: Diagnosis not present

## 2020-04-16 DIAGNOSIS — M6281 Muscle weakness (generalized): Secondary | ICD-10-CM | POA: Diagnosis not present

## 2020-04-16 DIAGNOSIS — R2681 Unsteadiness on feet: Secondary | ICD-10-CM | POA: Diagnosis not present

## 2020-04-16 DIAGNOSIS — S2241XD Multiple fractures of ribs, right side, subsequent encounter for fracture with routine healing: Secondary | ICD-10-CM | POA: Diagnosis not present

## 2020-04-17 DIAGNOSIS — M6281 Muscle weakness (generalized): Secondary | ICD-10-CM | POA: Diagnosis not present

## 2020-04-17 DIAGNOSIS — R2689 Other abnormalities of gait and mobility: Secondary | ICD-10-CM | POA: Diagnosis not present

## 2020-04-17 DIAGNOSIS — S2241XD Multiple fractures of ribs, right side, subsequent encounter for fracture with routine healing: Secondary | ICD-10-CM | POA: Diagnosis not present

## 2020-04-17 DIAGNOSIS — R2681 Unsteadiness on feet: Secondary | ICD-10-CM | POA: Diagnosis not present

## 2020-04-18 DIAGNOSIS — R2689 Other abnormalities of gait and mobility: Secondary | ICD-10-CM | POA: Diagnosis not present

## 2020-04-18 DIAGNOSIS — R2681 Unsteadiness on feet: Secondary | ICD-10-CM | POA: Diagnosis not present

## 2020-04-18 DIAGNOSIS — M6281 Muscle weakness (generalized): Secondary | ICD-10-CM | POA: Diagnosis not present

## 2020-04-18 DIAGNOSIS — S2241XD Multiple fractures of ribs, right side, subsequent encounter for fracture with routine healing: Secondary | ICD-10-CM | POA: Diagnosis not present

## 2020-04-19 DIAGNOSIS — R2681 Unsteadiness on feet: Secondary | ICD-10-CM | POA: Diagnosis not present

## 2020-04-19 DIAGNOSIS — M6281 Muscle weakness (generalized): Secondary | ICD-10-CM | POA: Diagnosis not present

## 2020-04-19 DIAGNOSIS — R2689 Other abnormalities of gait and mobility: Secondary | ICD-10-CM | POA: Diagnosis not present

## 2020-04-19 DIAGNOSIS — S2241XD Multiple fractures of ribs, right side, subsequent encounter for fracture with routine healing: Secondary | ICD-10-CM | POA: Diagnosis not present

## 2020-04-20 DIAGNOSIS — R2689 Other abnormalities of gait and mobility: Secondary | ICD-10-CM | POA: Diagnosis not present

## 2020-04-20 DIAGNOSIS — S2241XD Multiple fractures of ribs, right side, subsequent encounter for fracture with routine healing: Secondary | ICD-10-CM | POA: Diagnosis not present

## 2020-04-20 DIAGNOSIS — R2681 Unsteadiness on feet: Secondary | ICD-10-CM | POA: Diagnosis not present

## 2020-04-20 DIAGNOSIS — M6281 Muscle weakness (generalized): Secondary | ICD-10-CM | POA: Diagnosis not present

## 2020-04-21 DIAGNOSIS — S2241XD Multiple fractures of ribs, right side, subsequent encounter for fracture with routine healing: Secondary | ICD-10-CM | POA: Diagnosis not present

## 2020-04-21 DIAGNOSIS — R2681 Unsteadiness on feet: Secondary | ICD-10-CM | POA: Diagnosis not present

## 2020-04-21 DIAGNOSIS — R2689 Other abnormalities of gait and mobility: Secondary | ICD-10-CM | POA: Diagnosis not present

## 2020-04-21 DIAGNOSIS — M6281 Muscle weakness (generalized): Secondary | ICD-10-CM | POA: Diagnosis not present

## 2020-04-22 DIAGNOSIS — M6281 Muscle weakness (generalized): Secondary | ICD-10-CM | POA: Diagnosis not present

## 2020-04-22 DIAGNOSIS — R2689 Other abnormalities of gait and mobility: Secondary | ICD-10-CM | POA: Diagnosis not present

## 2020-04-22 DIAGNOSIS — R2681 Unsteadiness on feet: Secondary | ICD-10-CM | POA: Diagnosis not present

## 2020-04-22 DIAGNOSIS — S2241XD Multiple fractures of ribs, right side, subsequent encounter for fracture with routine healing: Secondary | ICD-10-CM | POA: Diagnosis not present

## 2020-04-23 DIAGNOSIS — M6281 Muscle weakness (generalized): Secondary | ICD-10-CM | POA: Diagnosis not present

## 2020-04-23 DIAGNOSIS — S2241XD Multiple fractures of ribs, right side, subsequent encounter for fracture with routine healing: Secondary | ICD-10-CM | POA: Diagnosis not present

## 2020-04-23 DIAGNOSIS — R2689 Other abnormalities of gait and mobility: Secondary | ICD-10-CM | POA: Diagnosis not present

## 2020-04-23 DIAGNOSIS — R2681 Unsteadiness on feet: Secondary | ICD-10-CM | POA: Diagnosis not present

## 2020-04-24 ENCOUNTER — Non-Acute Institutional Stay (SKILLED_NURSING_FACILITY): Payer: Medicare PPO | Admitting: Family

## 2020-04-24 ENCOUNTER — Other Ambulatory Visit: Payer: Self-pay | Admitting: Family

## 2020-04-24 ENCOUNTER — Encounter: Payer: Self-pay | Admitting: Family

## 2020-04-24 DIAGNOSIS — R2689 Other abnormalities of gait and mobility: Secondary | ICD-10-CM | POA: Diagnosis not present

## 2020-04-24 DIAGNOSIS — I10 Essential (primary) hypertension: Secondary | ICD-10-CM | POA: Diagnosis not present

## 2020-04-24 DIAGNOSIS — S2241XD Multiple fractures of ribs, right side, subsequent encounter for fracture with routine healing: Secondary | ICD-10-CM

## 2020-04-24 DIAGNOSIS — F411 Generalized anxiety disorder: Secondary | ICD-10-CM | POA: Diagnosis not present

## 2020-04-24 DIAGNOSIS — R2681 Unsteadiness on feet: Secondary | ICD-10-CM

## 2020-04-24 DIAGNOSIS — M6281 Muscle weakness (generalized): Secondary | ICD-10-CM | POA: Diagnosis not present

## 2020-04-24 DIAGNOSIS — E871 Hypo-osmolality and hyponatremia: Secondary | ICD-10-CM

## 2020-04-24 DIAGNOSIS — K219 Gastro-esophageal reflux disease without esophagitis: Secondary | ICD-10-CM | POA: Diagnosis not present

## 2020-04-24 DIAGNOSIS — I5032 Chronic diastolic (congestive) heart failure: Secondary | ICD-10-CM

## 2020-04-24 DIAGNOSIS — H04123 Dry eye syndrome of bilateral lacrimal glands: Secondary | ICD-10-CM | POA: Diagnosis not present

## 2020-04-24 DIAGNOSIS — J069 Acute upper respiratory infection, unspecified: Secondary | ICD-10-CM | POA: Diagnosis not present

## 2020-04-24 DIAGNOSIS — K1379 Other lesions of oral mucosa: Secondary | ICD-10-CM

## 2020-04-24 DIAGNOSIS — G8929 Other chronic pain: Secondary | ICD-10-CM

## 2020-04-24 DIAGNOSIS — M25511 Pain in right shoulder: Secondary | ICD-10-CM

## 2020-04-24 DIAGNOSIS — E039 Hypothyroidism, unspecified: Secondary | ICD-10-CM | POA: Diagnosis not present

## 2020-04-24 MED ORDER — PANTOPRAZOLE SODIUM 40 MG PO TBEC: 40.0000 mg | DELAYED_RELEASE_TABLET | Freq: Every day | ORAL | 0 refills | Status: DC

## 2020-04-24 MED ORDER — LIDOCAINE VISCOUS HCL 2 % MT SOLN: 15.0000 mL | OROMUCOSAL | 0 refills | Status: DC | PRN

## 2020-04-24 MED ORDER — POTASSIUM CHLORIDE CRYS ER 20 MEQ PO TBCR: 40.0000 meq | EXTENDED_RELEASE_TABLET | Freq: Every day | ORAL | 0 refills | Status: DC

## 2020-04-24 MED ORDER — BIOFREEZE 4 % EX GEL: 3.0000 [oz_av] | Freq: Three times a day (TID) | CUTANEOUS | 0 refills | Status: AC | PRN

## 2020-04-24 MED ORDER — ALPRAZOLAM 0.25 MG PO TABS: 0.2500 mg | ORAL_TABLET | Freq: Every day | ORAL | 0 refills | Status: DC

## 2020-04-24 MED ORDER — LEVOTHYROXINE SODIUM 75 MCG PO TABS: 75.0000 ug | ORAL_TABLET | Freq: Every day | ORAL | 0 refills | Status: DC

## 2020-04-24 MED ORDER — AMLODIPINE BESYLATE 10 MG PO TABS: 10.0000 mg | ORAL_TABLET | Freq: Every day | ORAL | 0 refills | Status: DC

## 2020-04-24 MED ORDER — POLYVINYL ALCOHOL 1.4 % OP SOLN: 1.0000 [drp] | OPHTHALMIC | 0 refills | Status: DC | PRN

## 2020-04-24 MED ORDER — ONDANSETRON 4 MG PO TBDP
4.0000 mg | ORAL_TABLET | Freq: Every day | ORAL | 0 refills | Status: DC | PRN
Start: 2020-04-24 — End: 2020-05-12

## 2020-04-24 MED ORDER — HYDRALAZINE HCL 25 MG PO TABS: 25.0000 mg | ORAL_TABLET | Freq: Three times a day (TID) | ORAL | 0 refills | Status: DC

## 2020-04-24 MED ORDER — CYCLOSPORINE 0.05 % OP EMUL: 1.0000 [drp] | Freq: Two times a day (BID) | OPHTHALMIC | 0 refills | Status: AC

## 2020-04-24 MED ORDER — FUROSEMIDE 40 MG PO TABS: 40.0000 mg | ORAL_TABLET | Freq: Every day | ORAL | 0 refills | Status: DC

## 2020-04-24 NOTE — Progress Notes (Signed)
Location:  [place] Room Number: 937-T Place of Service:  SNF [phone])  Provider: Marlowe Sax FNP-C   PCP: Ronnald Nian, DO Patient Care Team: Ronnald Nian, DO as PCP - General (Family Medicine) Michael Boston, MD as Consulting Physician (General Surgery) Milus Banister, MD as Attending Physician (Gastroenterology)  Extended Emergency Contact Information Primary Emergency Contact: Irving Burton [place] Phone: [phone] Relation: Sister Secondary Emergency Contact: [facility] Mobile Phone: [phone] Relation: Daughter  Code Status: DNR  Goals of care:  Advanced Directive information Advanced Directives 04/24/2020  Does Patient Have a Medical Advance Directive? Yes  Type of Advance Directive Out of facility DNR (pink MOST or yellow form)  Does patient want to make changes to medical advance directive? No - Patient declined  Copy of [place] in Chart? -     Allergies  Allergen Reactions  . Codeine Nausea Only    REACTION: nausea  . Sulfamethoxazole Rash    REACTION: rash    Chief Complaint  Patient presents with  . Discharge Note    Discharge from SNF to Home.    HPI:  84 y.o. female seen today at [facility] for discharge home.She was here for short term rehabilitation for post hospital admission from  03/05/2020 - 03/14/2020 for a fall at home.she fell on her right side possibly against a table.Also struck her right shoulder and right chest.she did not hit her head or had any loss of consciousness.she had had N/V/D for 4 days and had gone to urgent care before admission was treated with Zofran and send home. She was also on new medication lisinopril/HCTZ prescribed 5  days prior.In ED her B/p was elevated,NA 111 baseline 130's,CR 1.26 up from baseline 1.1 WBC 16.6.Fluid restriction 1000 cc /day and sodium tablets given.also regular diet with extra salt.Na  improved to 128 on discharge.   X-rays showed right 7th -9th rib fractures with displacement of the 9th rib.Had some chest wall air but no pneumothorax.she was treated with I.v fentanyl ,morphine,zofran ,phernagan and NS bolus.Her shoulder X-ray showed AC joint separation which was deemed inoperable.Her pain was managed,IS and evaluated by PT.Diarrhea resolved. Right arm sling follow up with Orthopedic Dr.Varkey she had bilateral essential tremors and outpatient work up was recommended. Also had urine retention on 03/11/2020 and foley was placed with voiding trial.she had UTI positive Klebsiella treated with Keflex through 8/26.   She was readmitted on 03/25/2020 for hypoxia oxygen saturation were 88 % despite 3 liters of oxygen via nasal cannula. CXR showed moderate right pleural effusion ,small left pleural effusion,right lower lobe volume loss and ground glass representing edema versus atelectasis.blood Cx.she was treated with IV vancomycin and cefepime.IS and lasix was given.ECHO done EF 55-60 % with no wall motion abnormalities.Marland Kitchencourse of Augmentin and was continued on Salt tablets.Code status changed to DNR. Had intermittent recurrent urinary retention to follow up with outpatient Urology.Negative for COVID-19.Na 132,had mild elevated transaminates which were monitored on D/C AST 57,ALT 47,ALK phos 99. She was discharged to back to [facility] 03/28/2020.Her Na here 131.Her Potassium was 2.9 and was replete which normalized.    Has has worked well with PT/OT now stable for discharge home with support .She requires assistance with transfer.She will be discharged home with Home health PT/OT to continue with ROM, Exercise, Gait stability and muscle strengthening.She will  require DME bedside commode Patient unable to safely and independently perform toileting transfer in home  with unsteady gait and right shoulder pain.She will also require a lightweight Wheelchair to enable her to maintain current level of independence  with ADL's which cannot be achieved with walker or cane.She will need a Home health Nurse for medication management.Home health services will be arranged by facility social worker prior to discharge.Prescription medication will be written x 1 month then patient to follow up with PCP in 1-2 weeks.She denies any acute issues this visit.Facility staff report no new concerns.she is a high risk for falls and readmission.she would benefit from Skilled facility but wants to go home with daughter.    Past Medical History:  Diagnosis Date  . Anemia    on iron now  . Cataract   . Hypertension   . Hypothyroidism   . Osteoporosis   . PONV (postoperative nausea and vomiting)   . Thyroid disease   . Varicose vein     Past Surgical History:  Procedure Laterality Date  . ABDOMINAL HYSTERECTOMY  1975   partial  . APPENDECTOMY  1975   with hysterectomy  . HEMORROIDECTOMY     [phone]  . LAPAROSCOPIC SMALL BOWEL RESECTION N/A 11/29/2014   Procedure: LAPAROSCOPIC SMALL INTESTINE RESECTION;  Surgeon: Michael Boston, MD;  Location: [facility] ORS;  Service: General;  Laterality: N/A;  . [place]      reports that she has never smoked. She has never used smokeless tobacco. She reports that she does not drink alcohol and does not use drugs. Social History   Socioeconomic History  . Marital status: Widowed    Spouse name: Not on file  . Number of children: Not on file  . Years of education: Not on file  . Highest education level: Not on file  Occupational History  . Occupation: Retired  Tobacco Use  . Smoking status: Never Smoker  . Smokeless tobacco: Never Used  Vaping Use  . Vaping Use: Never used  Substance and Sexual Activity  . Alcohol use: No    Alcohol/week: 0.0 standard drinks  . Drug use: No  . Sexual activity: Not Currently  Other Topics Concern  . Not on file  Social History Narrative  . Not on file   Social Determinants of Health   Financial Resource  Strain: Low Risk   . Difficulty of Paying Living Expenses: Not hard at all  Food Insecurity: No Food Insecurity  . Worried About Charity fundraiser in the Last Year: Never true  . Ran Out of Food in the Last Year: Never true  Transportation Needs: No Transportation Needs  . Lack of Transportation (Medical): No  . Lack of Transportation (Non-Medical): No  Physical Activity: Inactive  . Days of Exercise per Week: 0 days  . Minutes of Exercise per Session: 0 min  Stress: No Stress Concern Present  . Feeling of Stress : Not at all  Social Connections: Moderately Integrated  . Frequency of Communication with Friends and Family: More than three times a week  . Frequency of Social Gatherings with Friends and Family: More than three times a week  . Attends Religious Services: More than 4 times per year  . Active Member of Clubs or Organizations: Yes  . Attends Archivist Meetings: More than 4 times per year  . Marital Status: Widowed  Intimate Partner Violence:   . Fear of Current or Ex-Partner: Not on file  . Emotionally Abused: Not on file  . Physically Abused: Not on file  . Sexually Abused: Not  on file    Allergies  Allergen Reactions  . Codeine Nausea Only    REACTION: nausea  . Sulfamethoxazole Rash    REACTION: rash    Pertinent  Health Maintenance Due  Topic Date Due  . INFLUENZA VACCINE  02/17/2020  . DEXA SCAN  Completed  . PNA vac Low Risk Adult  Completed    Medications: Outpatient Encounter Medications as of 04/24/2020  Medication Sig  . acetaminophen (TYLENOL) 325 MG tablet Take 2 tablets (650 mg total) by mouth every 6 (six) hours as needed for mild pain, fever or headache.  . ALPRAZolam (XANAX) 0.25 MG tablet Take 1 tablet (0.25 mg total) by mouth at bedtime.  Marland Kitchen amLODipine (NORVASC) 10 MG tablet Take 1 tablet (10 mg total) by mouth daily.  . bisacodyl (DULCOLAX) 10 MG suppository Place 10 mg rectally as needed for moderate constipation.  .  cycloSPORINE (RESTASIS) 0.05 % ophthalmic emulsion Place 1 drop into both eyes 2 (two) times daily.  . furosemide (LASIX) 40 MG tablet Take 1 tablet (40 mg total) by mouth daily.  . hydrALAZINE (APRESOLINE) 25 MG tablet Take 1 tablet (25 mg total) by mouth every 8 (eight) hours.  Marland Kitchen levothyroxine (SYNTHROID) 75 MCG tablet Take 1 tablet (75 mcg total) by mouth daily before breakfast.  . lidocaine (XYLOCAINE) 2 % solution Use as directed 15 mLs in the mouth or throat every 4 (four) hours as needed for mouth pain.  . Magnesium Hydroxide (MILK OF MAGNESIA PO) Take 30 mLs by mouth as needed.  . ondansetron (ZOFRAN-ODT) 4 MG disintegrating tablet Take 1 tablet (4 mg total) by mouth daily as needed for nausea.  . pantoprazole (PROTONIX) 40 MG tablet Take 1 tablet (40 mg total) by mouth daily.  . polyethylene glycol (MIRALAX) 17 g packet Take 17 g by mouth as needed.  . polyvinyl alcohol (LIQUIFILM TEARS) 1.4 % ophthalmic solution Place 1 drop into both eyes as needed for dry eyes.  . potassium chloride SA (KLOR-CON) 20 MEQ tablet Take 2 tablets (40 mEq total) by mouth daily.  . Sodium Phosphates (RA SALINE ENEMA RE) Place rectally as needed.  . [DISCONTINUED] ALPRAZolam (XANAX) 0.25 MG tablet Take 0.25 mg by mouth at bedtime.  . [DISCONTINUED] amLODipine (NORVASC) 10 MG tablet Take 10 mg by mouth daily.  . [DISCONTINUED] cycloSPORINE (RESTASIS) 0.05 % ophthalmic emulsion Place 1 drop into both eyes 2 (two) times daily.   . [DISCONTINUED] furosemide (LASIX) 40 MG tablet Take 1 tablet (40 mg total) by mouth daily.  . [DISCONTINUED] hydrALAZINE (APRESOLINE) 25 MG tablet Take 1 tablet (25 mg total) by mouth every 8 (eight) hours.  . [DISCONTINUED] levothyroxine (SYNTHROID) 75 MCG tablet Take 75 mcg by mouth daily before breakfast.  . [DISCONTINUED] lidocaine (XYLOCAINE) 2 % solution Use as directed 15 mLs in the mouth or throat every 4 (four) hours as needed for mouth pain.  . [DISCONTINUED] ondansetron  (ZOFRAN-ODT) 4 MG disintegrating tablet Take 4 mg by mouth daily as needed for nausea.  . [DISCONTINUED] pantoprazole (PROTONIX) 40 MG tablet Take 40 mg by mouth daily.  . [DISCONTINUED] polyvinyl alcohol (LIQUIFILM TEARS) 1.4 % ophthalmic solution Place 1 drop into both eyes as needed for dry eyes.  . [DISCONTINUED] potassium chloride SA (KLOR-CON) 20 MEQ tablet Take 2 tablets (40 mEq total) by mouth daily.  . Menthol, Topical Analgesic, (BIOFREEZE) 4 % GEL Apply 3 oz topically 3 (three) times daily as needed.  . [DISCONTINUED] levothyroxine (SYNTHROID) 50 MCG tablet TAKE 1 TABLET  BY MOUTH EVERY MORNING   No facility-administered encounter medications on file as of 04/24/2020.     Review of Systems  Constitutional: Negative for appetite change, chills, fatigue and fever.  HENT: Positive for hearing loss. Negative for congestion, ear discharge, ear pain, postnasal drip, rhinorrhea, sinus pressure, sinus pain, sneezing, sore throat and trouble swallowing.   Eyes: Negative for pain, discharge, redness and itching.       Dry eyes  Respiratory: Negative for cough, chest tightness, shortness of breath and wheezing.   Cardiovascular: Negative for chest pain, palpitations and leg swelling.  Gastrointestinal: Negative for abdominal distention, abdominal pain, constipation, diarrhea, nausea and vomiting.  Endocrine: Negative for cold intolerance, heat intolerance, polydipsia, polyphagia and polyuria.  Genitourinary: Negative for difficulty urinating, dysuria, flank pain, frequency and urgency.  Musculoskeletal: Positive for gait problem. Negative for arthralgias, joint swelling and myalgias.  Skin: Negative for color change, pallor, rash and wound.  Neurological: Negative for dizziness, speech difficulty, light-headedness, numbness and headaches.  Hematological: Does not bruise/bleed easily.  Psychiatric/Behavioral: Negative for agitation, behavioral problems and sleep disturbance. The patient is  nervous/anxious.     Vitals:   04/24/20 1001  BP: 116/68  Pulse: 94  Resp: 20  Temp: 97.7 F (36.5 C)  Weight: 140 lb (63.5 kg)  Height: 5' 7"  (1.702 m)   Body mass index is 21.93 kg/m. @PHYSEXAMB @ Physical Exam Vitals and nursing note reviewed.  Constitutional:      General: She is not in acute distress.    Appearance: She is normal weight.  HENT:     Head: Normocephalic.     Nose: Nose normal. No congestion or rhinorrhea.     Mouth/Throat:     Mouth: Mucous membranes are moist.     Pharynx: Oropharynx is clear. No oropharyngeal exudate or posterior oropharyngeal erythema.  Eyes:     General: No scleral icterus.       Right eye: No discharge.        Left eye: No discharge.     Extraocular Movements: Extraocular movements intact.     Conjunctiva/sclera: Conjunctivae normal.     Pupils: Pupils are equal, round, and reactive to light.  Cardiovascular:     Rate and Rhythm: Normal rate and regular rhythm.     Pulses: Normal pulses.     Heart sounds: Normal heart sounds. No murmur heard.  No friction rub. No gallop.   Pulmonary:     Effort: Pulmonary effort is normal. No respiratory distress.     Breath sounds: Normal breath sounds. No wheezing, rhonchi or rales.  Chest:     Chest wall: No tenderness.  Abdominal:     General: Bowel sounds are normal. There is no distension.     Palpations: Abdomen is soft. There is no mass.     Tenderness: There is no abdominal tenderness. There is no right CVA tenderness, left CVA tenderness, guarding or rebound.  Musculoskeletal:        General: No swelling or tenderness.     Cervical back: Normal range of motion. No rigidity or tenderness.     Right lower leg: No edema.     Left lower leg: No edema.     Comments: Unsteady gait right shoulder limited ROM due to pain   Lymphadenopathy:     Cervical: No cervical adenopathy.  Skin:    General: Skin is warm.     Coloration: Skin is not pale.     Findings: No bruising, erythema or  rash.  Neurological:     Mental Status: She is alert and oriented to person, place, and time.     Cranial Nerves: No cranial nerve deficit.     Sensory: No sensory deficit.     Coordination: Coordination normal.     Gait: Gait abnormal.  Psychiatric:        Mood and Affect: Mood is anxious.        Speech: Speech normal.        Behavior: Behavior normal.        Thought Content: Thought content normal.     Labs reviewed: Basic Metabolic Panel: Recent Labs    03/07/20 0719 03/07/20 1407 03/27/20 0522 03/27/20 0522 03/28/20 0504 03/31/20 0000 04/01/20 1213 04/01/20 1213 04/04/20 0000 04/11/20 0000 04/15/20 0000  NA 121*  120*   < > 130*   < > 132*   < > 131*  --  131* 131* 129*  K 3.8   < > 3.7   < > 3.3*   < > 4.4   < > 3.6 2.9* 4.4  CL 92*   < > 100   < > 97*   < > 91*   < > 88* 86* 88*  CO2 18*   < > 21*   < > 25   < > 26   < > 30* 36* 27*  GLUCOSE 107*   < > 101*  --  87  --  98  --   --   --   --   BUN 11   < > 13   < > 15   < > 14  --  12 11 16   CREATININE 0.90   < > 0.73   < > 0.88   < > 1.08*  --  0.9 0.8 1.0  CALCIUM 7.9*   < > 8.3*   < > 8.2*   < > 8.9   < > 8.4* 8.5* 8.7  MG  --    < > 2.0  --  2.0  --  1.7  --   --   --   --   PHOS 2.2*  --   --   --   --   --   --   --   --   --   --    < > = values in this interval not displayed.   Liver Function Tests: Recent Labs    03/05/20 0913 03/05/20 0913 03/07/20 0719 03/27/20 0522 04/01/20 1213  AST 74*  --   --  57* 37  ALT 35  --   --  47* 30  ALKPHOS 64  --   --  99 97  BILITOT 1.3*  --   --  1.0 1.1  PROT 8.3*  --   --  6.5 7.0  ALBUMIN 4.3   < > 3.2* 3.3* 3.3*   < > = values in this interval not displayed.   Recent Labs    03/05/20 0913  LIPASE 31   CBC: Recent Labs    03/09/20 0628 03/09/20 0628 03/10/20 0738 03/10/20 0738 03/25/20 0415 03/25/20 0418 03/26/20 0502 03/26/20 0502 03/27/20 0522 03/31/20 0000 04/01/20 1213  WBC 10.5   < > 7.7   < > 6.0   < > 6.2   < > 6.5 5.0 10.3    NEUTROABS 7.5  --  4.7  --  4.5  --   --   --   --   --   --  HGB 10.5*   < > 10.4*   < > 10.4*   < > 10.1*   < > 10.7* 11.3* 11.8*  HCT 29.8*   < > 29.3*   < > 31.4*   < > 31.5*   < > 33.1* 33* 36.9  MCV 87.1   < > 87.5   < > 92.6   < > 93.8  --  94.6  --  93.7  PLT 188   < > 214   < > 326   < > 303   < > 341 256 333   < > = values in this interval not displayed.   Procedures and Imaging Studies During Stay: DG Chest 2 View  Result Date: 04/01/2020 CLINICAL DATA:  Shortness of breath. EXAM: CHEST - 2 VIEW COMPARISON:  March 27, 2020. FINDINGS: Stable cardiomediastinal silhouette. No pneumothorax is noted. Bibasilar subsegmental atelectasis is noted with small pleural effusions. Stable right rib fractures are noted. IMPRESSION: Bibasilar subsegmental atelectasis with small pleural effusions. Electronically Signed   By: Marijo Conception M.D.   On: 04/01/2020 12:45   DG Chest Port 1 View  Result Date: 03/27/2020 CLINICAL DATA:  Pleural effusion EXAM: PORTABLE CHEST 1 VIEW COMPARISON:  03/25/2020 FINDINGS: Shallow inspiration. Cardiac enlargement. Bilateral pleural effusions greater on the right. Bilateral basilar atelectasis or infiltration, also greater on the right. Similar appearance to previous study. IMPRESSION: Bilateral pleural effusions and basilar atelectasis or infiltration, greater on the right. Electronically Signed   By: Lucienne Capers M.D.   On: 03/27/2020 06:17    Assessment/Plan:    1. Unsteady gait Has worked well with PT/ OT. will discharge home PT/OT to continue with ROM, Exercise, Gait stability and muscle strengthening. - A bedside commode Patient unable to safely and independently perform toileting transfer in home with unsteady gait and right shoulder pain. - A  lightweight Wheelchair to enable her to maintain current level of independence with ADL's which cannot be achieved with walker or cane. - Fall and safety precautions.  2. Essential hypertension B/p  stable. Continue on Amlodipine ,hydralazine and Furosemide  - amLODipine (NORVASC) 10 MG tablet; Take 1 tablet (10 mg total) by mouth daily.  Dispense: 30 tablet; Refill: 0 - furosemide (LASIX) 40 MG tablet; Take 1 tablet (40 mg total) by mouth daily.  Dispense: 30 tablet; Refill: 0 - hydrALAZINE (APRESOLINE) 25 MG tablet; Take 1 tablet (25 mg total) by mouth every 8 (eight) hours.  Dispense: 90 tablet; Refill: 0  3. Hypothyroidism, unspecified type TSH level 6.73 Levothyroxine adjusted 04/14/2020 to 75 mcg tablet  - levothyroxine (SYNTHROID) 75 MCG tablet; Take 1 tablet (75 mcg total) by mouth daily before breakfast.  Dispense: 30 tablet; Refill: 0 - TSH level in 8 weeks.  4. Gastroesophageal reflux disease without esophagitis Symptoms controlled.continue on Protonix  - pantoprazole (PROTONIX) 40 MG tablet; Take 1 tablet (40 mg total) by mouth daily.  Dispense: 30 tablet; Refill: 0  5. Closed fracture of multiple ribs of right side with routine healing, subsequent encounter Breathing stable. Pain under control - continue on Incentive spirometer   6. Generalized anxiety disorder Continue to require Xanax gets very anxious but this has improved on Xanax.  - ALPRAZolam (XANAX) 0.25 MG tablet; Take 1 tablet (0.25 mg total) by mouth at bedtime.  Dispense: 30 tablet; Refill: 0  7. Dry eye syndrome of both eyes Continue on eye drops as below.  - cycloSPORINE (RESTASIS) 0.05 % ophthalmic emulsion; Place 1 drop into both  eyes 2 (two) times daily.  Dispense: 5.5 mL; Refill: 0 - polyvinyl alcohol (LIQUIFILM TEARS) 1.4 % ophthalmic solution; Place 1 drop into both eyes as needed for dry eyes.  Dispense: 30 mL; Refill: 0  8. Mouth pain Chronic.continue on lidocaine as needed - lidocaine (XYLOCAINE) 2 % solution; Use as directed 15 mLs in the mouth or throat every 4 (four) hours as needed for mouth pain.  Dispense: 20 mL; Refill: 0  9. Chronic diastolic congestive heart failure (HCC) No signs of  fluid overload.continue on Furosemide and potassium supplement.  - furosemide (LASIX) 40 MG tablet; Take 1 tablet (40 mg total) by mouth daily.  Dispense: 30 tablet; Refill: 0 - potassium chloride SA (KLOR-CON) 20 MEQ tablet; Take 2 tablets (40 mEq total) by mouth daily.  Dispense: 60 tablet; Refill: 0  10. Chronic right shoulder pain Request Biofreeze today.  - Menthol, Topical Analgesic, (BIOFREEZE) 4 % GEL; Apply 3 oz topically 3 (three) times daily as needed.  Dispense: 90 g; Refill: 0  11. Hyponatremia  Latest Na + 131 much improvement since hospital discharge. - continue on Fluid restriction 1000 cc per day  - continue on regular diet with extra salt  - continue on sodium tabs   Patient is being discharged with the following home health services:   -PT/OT for ROM, exercise, gait stability and muscle strengthening  -  HH RN for medication management  Patient is being discharged with the following durable medical equipment:    - A bedside commode Patient unable to safely and independently perform toileting transfer in home with unsteady gait and right shoulder pain.  - A  lightweight Wheelchair to enable her to maintain current level of independence with ADL's which cannot be achieved with walker or cane.  Patient has been advised to f/u with their PCP in 1-2 weeks to for a transitions of care visit.Social services at their facility was responsible for arranging this appointment.  Pt was provided with adequate prescriptions of noncontrolled medications to reach the scheduled appointment.For controlled substances, a limited supply was provided as appropriate for the individual patient. If the pt normally receives these medications from a pain clinic or has a contract with another physician, these medications should be received from that clinic or physician only).    Future labs/tests needed:  CBC, BMP in 1-2 weeks PCP TSH in 8 weeks

## 2020-04-24 NOTE — Patient Instructions (Signed)
-   CBC, BMP in 1-2 weeks PCP  - TSH in 8 weeks

## 2020-04-25 DIAGNOSIS — R2681 Unsteadiness on feet: Secondary | ICD-10-CM | POA: Diagnosis not present

## 2020-04-25 DIAGNOSIS — R2689 Other abnormalities of gait and mobility: Secondary | ICD-10-CM | POA: Diagnosis not present

## 2020-04-25 DIAGNOSIS — M6281 Muscle weakness (generalized): Secondary | ICD-10-CM | POA: Diagnosis not present

## 2020-04-25 DIAGNOSIS — S2241XD Multiple fractures of ribs, right side, subsequent encounter for fracture with routine healing: Secondary | ICD-10-CM | POA: Diagnosis not present

## 2020-04-26 DIAGNOSIS — Z9181 History of falling: Secondary | ICD-10-CM | POA: Diagnosis not present

## 2020-04-26 DIAGNOSIS — S43004D Unspecified dislocation of right shoulder joint, subsequent encounter: Secondary | ICD-10-CM | POA: Diagnosis not present

## 2020-04-26 DIAGNOSIS — R2681 Unsteadiness on feet: Secondary | ICD-10-CM | POA: Diagnosis not present

## 2020-04-26 DIAGNOSIS — S2241XD Multiple fractures of ribs, right side, subsequent encounter for fracture with routine healing: Secondary | ICD-10-CM | POA: Diagnosis not present

## 2020-04-26 DIAGNOSIS — R2689 Other abnormalities of gait and mobility: Secondary | ICD-10-CM | POA: Diagnosis not present

## 2020-04-26 DIAGNOSIS — M6281 Muscle weakness (generalized): Secondary | ICD-10-CM | POA: Diagnosis not present

## 2020-04-28 DIAGNOSIS — S2241XD Multiple fractures of ribs, right side, subsequent encounter for fracture with routine healing: Secondary | ICD-10-CM | POA: Diagnosis not present

## 2020-04-28 DIAGNOSIS — E039 Hypothyroidism, unspecified: Secondary | ICD-10-CM | POA: Diagnosis not present

## 2020-04-28 DIAGNOSIS — M810 Age-related osteoporosis without current pathological fracture: Secondary | ICD-10-CM | POA: Diagnosis not present

## 2020-04-28 DIAGNOSIS — E119 Type 2 diabetes mellitus without complications: Secondary | ICD-10-CM | POA: Diagnosis not present

## 2020-04-28 DIAGNOSIS — M8580 Other specified disorders of bone density and structure, unspecified site: Secondary | ICD-10-CM | POA: Diagnosis not present

## 2020-04-28 DIAGNOSIS — S43004D Unspecified dislocation of right shoulder joint, subsequent encounter: Secondary | ICD-10-CM | POA: Diagnosis not present

## 2020-04-28 DIAGNOSIS — I10 Essential (primary) hypertension: Secondary | ICD-10-CM | POA: Diagnosis not present

## 2020-04-28 DIAGNOSIS — D649 Anemia, unspecified: Secondary | ICD-10-CM | POA: Diagnosis not present

## 2020-04-28 DIAGNOSIS — F411 Generalized anxiety disorder: Secondary | ICD-10-CM | POA: Diagnosis not present

## 2020-04-29 ENCOUNTER — Telehealth: Payer: Self-pay | Admitting: General Practice

## 2020-04-29 DIAGNOSIS — S2241XD Multiple fractures of ribs, right side, subsequent encounter for fracture with routine healing: Secondary | ICD-10-CM | POA: Diagnosis not present

## 2020-04-29 DIAGNOSIS — I10 Essential (primary) hypertension: Secondary | ICD-10-CM | POA: Diagnosis not present

## 2020-04-29 DIAGNOSIS — S43004D Unspecified dislocation of right shoulder joint, subsequent encounter: Secondary | ICD-10-CM | POA: Diagnosis not present

## 2020-04-29 DIAGNOSIS — E039 Hypothyroidism, unspecified: Secondary | ICD-10-CM | POA: Diagnosis not present

## 2020-04-29 DIAGNOSIS — F411 Generalized anxiety disorder: Secondary | ICD-10-CM | POA: Diagnosis not present

## 2020-04-29 DIAGNOSIS — M810 Age-related osteoporosis without current pathological fracture: Secondary | ICD-10-CM | POA: Diagnosis not present

## 2020-04-29 DIAGNOSIS — M8580 Other specified disorders of bone density and structure, unspecified site: Secondary | ICD-10-CM | POA: Diagnosis not present

## 2020-04-29 DIAGNOSIS — E119 Type 2 diabetes mellitus without complications: Secondary | ICD-10-CM | POA: Diagnosis not present

## 2020-04-29 DIAGNOSIS — D649 Anemia, unspecified: Secondary | ICD-10-CM | POA: Diagnosis not present

## 2020-04-29 NOTE — Telephone Encounter (Signed)
Verbal order given per Baldo Ash Nche-NP

## 2020-04-29 NOTE — Telephone Encounter (Signed)
Christy Wade is calling and wanted to see if Dr. Loletha Grayer can give orders for [place] for nursing 2 week 1 and 1 week 7 including a PRN. Informed caller that Dr. Loletha Grayer is out of the office and request would be sent to the provider of the day. Patient is going to transfer to Dr. Zigmund Daniel office on 10/25, please advise. CB is [phone]

## 2020-04-29 NOTE — Telephone Encounter (Signed)
ok 

## 2020-04-30 ENCOUNTER — Inpatient Hospital Stay: Payer: Medicare PPO | Admitting: Family Medicine

## 2020-04-30 DIAGNOSIS — M810 Age-related osteoporosis without current pathological fracture: Secondary | ICD-10-CM | POA: Diagnosis not present

## 2020-04-30 DIAGNOSIS — S2241XD Multiple fractures of ribs, right side, subsequent encounter for fracture with routine healing: Secondary | ICD-10-CM | POA: Diagnosis not present

## 2020-04-30 DIAGNOSIS — I10 Essential (primary) hypertension: Secondary | ICD-10-CM | POA: Diagnosis not present

## 2020-04-30 DIAGNOSIS — D649 Anemia, unspecified: Secondary | ICD-10-CM | POA: Diagnosis not present

## 2020-04-30 DIAGNOSIS — F411 Generalized anxiety disorder: Secondary | ICD-10-CM | POA: Diagnosis not present

## 2020-04-30 DIAGNOSIS — S43004D Unspecified dislocation of right shoulder joint, subsequent encounter: Secondary | ICD-10-CM | POA: Diagnosis not present

## 2020-04-30 DIAGNOSIS — E039 Hypothyroidism, unspecified: Secondary | ICD-10-CM | POA: Diagnosis not present

## 2020-04-30 DIAGNOSIS — M8580 Other specified disorders of bone density and structure, unspecified site: Secondary | ICD-10-CM | POA: Diagnosis not present

## 2020-04-30 DIAGNOSIS — E119 Type 2 diabetes mellitus without complications: Secondary | ICD-10-CM | POA: Diagnosis not present

## 2020-05-02 DIAGNOSIS — E119 Type 2 diabetes mellitus without complications: Secondary | ICD-10-CM | POA: Diagnosis not present

## 2020-05-02 DIAGNOSIS — E039 Hypothyroidism, unspecified: Secondary | ICD-10-CM | POA: Diagnosis not present

## 2020-05-02 DIAGNOSIS — F411 Generalized anxiety disorder: Secondary | ICD-10-CM | POA: Diagnosis not present

## 2020-05-02 DIAGNOSIS — D649 Anemia, unspecified: Secondary | ICD-10-CM | POA: Diagnosis not present

## 2020-05-02 DIAGNOSIS — I10 Essential (primary) hypertension: Secondary | ICD-10-CM | POA: Diagnosis not present

## 2020-05-02 DIAGNOSIS — S2241XD Multiple fractures of ribs, right side, subsequent encounter for fracture with routine healing: Secondary | ICD-10-CM | POA: Diagnosis not present

## 2020-05-02 DIAGNOSIS — M8580 Other specified disorders of bone density and structure, unspecified site: Secondary | ICD-10-CM | POA: Diagnosis not present

## 2020-05-02 DIAGNOSIS — M810 Age-related osteoporosis without current pathological fracture: Secondary | ICD-10-CM | POA: Diagnosis not present

## 2020-05-02 DIAGNOSIS — S43004D Unspecified dislocation of right shoulder joint, subsequent encounter: Secondary | ICD-10-CM | POA: Diagnosis not present

## 2020-05-05 ENCOUNTER — Ambulatory Visit (INDEPENDENT_AMBULATORY_CARE_PROVIDER_SITE_OTHER): Payer: Medicare PPO | Admitting: Sports Medicine

## 2020-05-05 ENCOUNTER — Ambulatory Visit (INDEPENDENT_AMBULATORY_CARE_PROVIDER_SITE_OTHER): Payer: Medicare PPO

## 2020-05-05 ENCOUNTER — Encounter: Payer: Self-pay | Admitting: Sports Medicine

## 2020-05-05 ENCOUNTER — Other Ambulatory Visit: Payer: Self-pay

## 2020-05-05 VITALS — BP 128/76 | HR 108 | Temp 97.5°F

## 2020-05-05 DIAGNOSIS — R8290 Unspecified abnormal findings in urine: Secondary | ICD-10-CM

## 2020-05-05 DIAGNOSIS — R062 Wheezing: Secondary | ICD-10-CM | POA: Diagnosis not present

## 2020-05-05 DIAGNOSIS — R5383 Other fatigue: Secondary | ICD-10-CM

## 2020-05-05 LAB — POCT URINALYSIS DIP (CLINITEK)
Bilirubin, UA: NEGATIVE
Blood, UA: NEGATIVE
Glucose, UA: NEGATIVE mg/dL
Ketones, POC UA: NEGATIVE mg/dL
Nitrite, UA: NEGATIVE
Spec Grav, UA: 1.01 (ref 1.010–1.025)
Urobilinogen, UA: 0.2 E.U./dL
pH, UA: 5.5 (ref 5.0–8.0)

## 2020-05-05 MED ORDER — CEFDINIR 300 MG PO CAPS: 300.0000 mg | ORAL_CAPSULE | Freq: Two times a day (BID) | ORAL | 0 refills | Status: DC

## 2020-05-05 MED ORDER — PREDNISONE 50 MG PO TABS: ORAL_TABLET | ORAL | 0 refills | Status: DC

## 2020-05-05 NOTE — Progress Notes (Signed)
    Procedures performed today:    None.  Independent interpretation of notes and tests performed by another provider:   None.  Brief History, Exam, Impression, and Recommendations:    Fatigue This is a frail and debilitated 84 year old female, she has a 3-day history of increasing fatigue. Caretaker has reported foul-smelling urine. No urgency, frequency, dribbling, hesitancy, fevers, chills, flank pain, abdominal pain. The only symptom is increasing fatigue. She also has some coarse sounds in her lungs bilaterally. On review of her history she did have a urine culture several months ago that grew out Klebsiella resistant to several antibiotics, sensitive to third-generation cephalosporins. Because of the course sounds and the foul-smelling urine plus increasing fatigue we will go ahead and diagnosed this as a UTI. I did explain to them that foul-smelling urine alone was not considered a symptom of UTI and did not need to be treated. Urinalysis did show pyuria. Adding chest x-ray, I would like to do a third-generation cephalosporin as this could potentially cover pulmonary bugs as well.  5 days of prednisone.   Return to see Dr. Zigmund Daniel for follow-up.    ___________________________________________ Gwen Her. Dianah Field, M.D., ABFM., CAQSM. Primary Care and [place] Instructor of [place] [facility]

## 2020-05-05 NOTE — Assessment & Plan Note (Signed)
This is a frail and debilitated 84 year old female, she has a 3-day history of increasing fatigue. Caretaker has reported foul-smelling urine. No urgency, frequency, dribbling, hesitancy, fevers, chills, flank pain, abdominal pain. The only symptom is increasing fatigue. She also has some coarse sounds in her lungs bilaterally. On review of her history she did have a urine culture several months ago that grew out Klebsiella resistant to several antibiotics, sensitive to third-generation cephalosporins. Because of the course sounds and the foul-smelling urine plus increasing fatigue we will go ahead and diagnosed this as a UTI. I did explain to them that foul-smelling urine alone was not considered a symptom of UTI and did not need to be treated. Urinalysis did show pyuria. Adding chest x-ray, I would like to do a third-generation cephalosporin as this could potentially cover pulmonary bugs as well.  5 days of prednisone.   Return to see Dr. Zigmund Daniel for follow-up.

## 2020-05-05 NOTE — Addendum Note (Signed)
Addended by: Narda Rutherford on: 05/05/2020 04:38 PM   Modules accepted: Orders

## 2020-05-06 DIAGNOSIS — S2241XD Multiple fractures of ribs, right side, subsequent encounter for fracture with routine healing: Secondary | ICD-10-CM | POA: Diagnosis not present

## 2020-05-06 DIAGNOSIS — M810 Age-related osteoporosis without current pathological fracture: Secondary | ICD-10-CM | POA: Diagnosis not present

## 2020-05-06 DIAGNOSIS — E039 Hypothyroidism, unspecified: Secondary | ICD-10-CM | POA: Diagnosis not present

## 2020-05-06 DIAGNOSIS — F411 Generalized anxiety disorder: Secondary | ICD-10-CM | POA: Diagnosis not present

## 2020-05-06 DIAGNOSIS — S43004D Unspecified dislocation of right shoulder joint, subsequent encounter: Secondary | ICD-10-CM | POA: Diagnosis not present

## 2020-05-06 DIAGNOSIS — I10 Essential (primary) hypertension: Secondary | ICD-10-CM | POA: Diagnosis not present

## 2020-05-06 DIAGNOSIS — D649 Anemia, unspecified: Secondary | ICD-10-CM | POA: Diagnosis not present

## 2020-05-06 DIAGNOSIS — E119 Type 2 diabetes mellitus without complications: Secondary | ICD-10-CM | POA: Diagnosis not present

## 2020-05-06 DIAGNOSIS — M8580 Other specified disorders of bone density and structure, unspecified site: Secondary | ICD-10-CM | POA: Diagnosis not present

## 2020-05-07 DIAGNOSIS — D649 Anemia, unspecified: Secondary | ICD-10-CM | POA: Diagnosis not present

## 2020-05-07 DIAGNOSIS — S2241XD Multiple fractures of ribs, right side, subsequent encounter for fracture with routine healing: Secondary | ICD-10-CM | POA: Diagnosis not present

## 2020-05-07 DIAGNOSIS — M810 Age-related osteoporosis without current pathological fracture: Secondary | ICD-10-CM | POA: Diagnosis not present

## 2020-05-07 DIAGNOSIS — M8580 Other specified disorders of bone density and structure, unspecified site: Secondary | ICD-10-CM | POA: Diagnosis not present

## 2020-05-07 DIAGNOSIS — F411 Generalized anxiety disorder: Secondary | ICD-10-CM | POA: Diagnosis not present

## 2020-05-07 DIAGNOSIS — S43004D Unspecified dislocation of right shoulder joint, subsequent encounter: Secondary | ICD-10-CM | POA: Diagnosis not present

## 2020-05-07 DIAGNOSIS — E119 Type 2 diabetes mellitus without complications: Secondary | ICD-10-CM | POA: Diagnosis not present

## 2020-05-07 DIAGNOSIS — I10 Essential (primary) hypertension: Secondary | ICD-10-CM | POA: Diagnosis not present

## 2020-05-07 DIAGNOSIS — E039 Hypothyroidism, unspecified: Secondary | ICD-10-CM | POA: Diagnosis not present

## 2020-05-07 LAB — URINE CULTURE
MICRO NUMBER:: 11085024
SPECIMEN QUALITY:: ADEQUATE

## 2020-05-08 DIAGNOSIS — E039 Hypothyroidism, unspecified: Secondary | ICD-10-CM | POA: Diagnosis not present

## 2020-05-08 DIAGNOSIS — E119 Type 2 diabetes mellitus without complications: Secondary | ICD-10-CM | POA: Diagnosis not present

## 2020-05-08 DIAGNOSIS — S43004D Unspecified dislocation of right shoulder joint, subsequent encounter: Secondary | ICD-10-CM | POA: Diagnosis not present

## 2020-05-08 DIAGNOSIS — F411 Generalized anxiety disorder: Secondary | ICD-10-CM | POA: Diagnosis not present

## 2020-05-08 DIAGNOSIS — D649 Anemia, unspecified: Secondary | ICD-10-CM | POA: Diagnosis not present

## 2020-05-08 DIAGNOSIS — M8580 Other specified disorders of bone density and structure, unspecified site: Secondary | ICD-10-CM | POA: Diagnosis not present

## 2020-05-08 DIAGNOSIS — I10 Essential (primary) hypertension: Secondary | ICD-10-CM | POA: Diagnosis not present

## 2020-05-08 DIAGNOSIS — S2241XD Multiple fractures of ribs, right side, subsequent encounter for fracture with routine healing: Secondary | ICD-10-CM | POA: Diagnosis not present

## 2020-05-08 DIAGNOSIS — M810 Age-related osteoporosis without current pathological fracture: Secondary | ICD-10-CM | POA: Diagnosis not present

## 2020-05-09 ENCOUNTER — Encounter: Payer: Self-pay | Admitting: Gastroenterology

## 2020-05-09 ENCOUNTER — Ambulatory Visit: Payer: Medicare PPO | Admitting: Gastroenterology

## 2020-05-09 VITALS — BP 110/60 | HR 90 | Ht 67.0 in | Wt 122.0 lb

## 2020-05-09 DIAGNOSIS — R1310 Dysphagia, unspecified: Secondary | ICD-10-CM

## 2020-05-09 DIAGNOSIS — R195 Other fecal abnormalities: Secondary | ICD-10-CM

## 2020-05-09 DIAGNOSIS — K649 Unspecified hemorrhoids: Secondary | ICD-10-CM | POA: Diagnosis not present

## 2020-05-09 MED ORDER — HYDROCORTISONE (PERIANAL) 2.5 % EX CREA: 1.0000 "application " | TOPICAL_CREAM | Freq: Two times a day (BID) | CUTANEOUS | 1 refills | Status: DC

## 2020-05-09 NOTE — Progress Notes (Signed)
05/09/2020 Christy Wade [id number] 11/20/1935   HISTORY OF PRESENT ILLNESS: This is an 84 year old female who is a patient of Dr. Ardis Hughs.  She was last seen here in January 2020 for complaints of intermittent loose stools.  We recommended conservative treatment with Imodium and a stool diary.  She is here today with her daughter and her sister for a couple of different issues.  First, her daughter again reports the loose stools.  The daughter thinks that the loose stools are more of an issue than what her mother leads onto.  The patient says that the diarrhea is intermittent and was doing okay until this week.  She was started on antibiotics for UTI on Monday so diarrhea has been worse.  She does have to wear protective undergarments when she goes in a public for fear of having incontinence, etc.  She complains that her hemorrhoids had been irritated earlier this week from all the diarrhea, but she is Preparation H and it seems to be better.  They also report that she was having some issues with dysphagia.  She has had really had a rough couple of months.  In August she was admitted with an extremely low sodium level/hyponatremia with sodium level as low as 109.  She ended up having a fall and broke some ribs and separated her shoulder.  She ultimately went to rehab.  Has become very weak and has very limited mobility of her right arm/hand due to pain.  She still has been working with rehab, but they say that she has a long way to go.  Nonetheless, during this hyponatremia issue they had given her sodium tablets to take.  Sodium tablets caused sores in her mouth and they believe likely caused irritation in her esophagus.  She was having a lot of issues with spitting up and was not eating because of this.  Those have since been discontinued and she says that she is really not having much problem swallowing at all anymore.  Her appetite has been coming back some as well.   Past Medical History:    Diagnosis Date  . Anemia    on iron now  . Cataract   . Hypertension   . Hypothyroidism   . Osteoporosis   . PONV (postoperative nausea and vomiting)   . Thyroid disease   . Varicose vein    Past Surgical History:  Procedure Laterality Date  . ABDOMINAL HYSTERECTOMY  1975   partial  . APPENDECTOMY  1975   with hysterectomy  . HEMORROIDECTOMY     [phone]  . LAPAROSCOPIC SMALL BOWEL RESECTION N/A 11/29/2014   Procedure: LAPAROSCOPIC SMALL INTESTINE RESECTION;  Surgeon: Michael Boston, MD;  Location: [facility];  Service: General;  Laterality: N/A;  . [place]    reports that she has never smoked. She has never used smokeless tobacco. She reports that she does not drink alcohol and does not use drugs. family history includes Bone cancer in her mother; Breast cancer in her sister; Diabetes in her father; Stroke in her father. Allergies  Allergen Reactions  . Codeine Nausea Only    REACTION: nausea  . Sulfamethoxazole Rash    REACTION: rash      Outpatient Encounter Medications as of 05/09/2020  Medication Sig  . acetaminophen (TYLENOL) 325 MG tablet Take 2 tablets (650 mg total) by mouth every 6 (six) hours as needed for mild pain, fever or headache.  Marland Kitchen amLODipine (NORVASC) 10 MG tablet  Take 1 tablet (10 mg total) by mouth daily.  . cefdinir (OMNICEF) 300 MG capsule Take 1 capsule (300 mg total) by mouth 2 (two) times daily.  . cycloSPORINE (RESTASIS) 0.05 % ophthalmic emulsion Place 1 drop into both eyes 2 (two) times daily.  . furosemide (LASIX) 40 MG tablet Take 1 tablet (40 mg total) by mouth daily.  . hydrALAZINE (APRESOLINE) 25 MG tablet Take 1 tablet (25 mg total) by mouth every 8 (eight) hours.  Marland Kitchen levothyroxine (SYNTHROID) 75 MCG tablet Take 1 tablet (75 mcg total) by mouth daily before breakfast.  . pantoprazole (PROTONIX) 40 MG tablet Take 1 tablet (40 mg total) by mouth daily.  . polyvinyl alcohol (LIQUIFILM TEARS) 1.4 % ophthalmic solution  Place 1 drop into both eyes as needed for dry eyes.  . potassium chloride SA (KLOR-CON) 20 MEQ tablet Take 2 tablets (40 mEq total) by mouth daily.  . predniSONE (DELTASONE) 50 MG tablet One tab PO daily for 5 days.  . ALPRAZolam (XANAX) 0.25 MG tablet Take 1 tablet (0.25 mg total) by mouth at bedtime. (Patient not taking: Reported on 05/09/2020)  . bisacodyl (DULCOLAX) 10 MG suppository Place 10 mg rectally as needed for moderate constipation. (Patient not taking: Reported on 05/05/2020)  . lidocaine (XYLOCAINE) 2 % solution Use as directed 15 mLs in the mouth or throat every 4 (four) hours as needed for mouth pain. (Patient not taking: Reported on 05/05/2020)  . Magnesium Hydroxide (MILK OF MAGNESIA PO) Take 30 mLs by mouth as needed. (Patient not taking: Reported on 05/05/2020)  . Menthol, Topical Analgesic, (BIOFREEZE) 4 % GEL Apply 3 oz topically 3 (three) times daily as needed. (Patient not taking: Reported on 05/05/2020)  . ondansetron (ZOFRAN-ODT) 4 MG disintegrating tablet Take 1 tablet (4 mg total) by mouth daily as needed for nausea. (Patient not taking: Reported on 05/05/2020)  . polyethylene glycol (MIRALAX) 17 g packet Take 17 g by mouth as needed. (Patient not taking: Reported on 05/05/2020)  . Sodium Phosphates (RA SALINE ENEMA RE) Place rectally as needed. (Patient not taking: Reported on 05/09/2020)   No facility-administered encounter medications on file as of 05/09/2020.     REVIEW OF SYSTEMS  : All other systems reviewed and negative except where noted in the History of Present Illness.   PHYSICAL EXAM: BP 110/60   Pulse 90   Ht 5\' 7"  (1.702 m)   Wt 122 lb (55.3 kg)   SpO2 94%   BMI 19.11 kg/m  General: Frail white female in no acute distress; in wheelchair Head: Normocephalic and atraumatic Eyes:  Sclerae anicteric, conjunctiva pink. Ears: Normal auditory acuity Lungs: Clear throughout to auscultation; no W/R/R. Heart: Regular rate and rhythm; no M/R/G. Abdomen:  Soft, non-distended.  BS present.  Non-tender. Musculoskeletal: Symmetrical with no gross deformities  Skin: No lesions on visible extremities Extremities: No edema  Neurological: Alert oriented x 4, grossly non-focal Psychological:  Alert and cooperative. Normal mood and affect  ASSESSMENT AND PLAN: *Dysphagia:  Thought secondary to sodium tablets that she was taking that caused sores in her mouth, etc.  They think that it affected her throat and esophagus as well.  Her swallowing is much improved and appetite is returning. *Loose stools:  This is chronic issue, likely multifactorial secondary to ileal resection, possibly some IBS.  She says that it had been better until starting antibiotics this week for UTI so likely acutely worse due to antibiotic side effects.  I would like her to start probiotic in  the form of Florastor twice daily.  I have asked her daughter to begin keeping a stool diary after she has completed the antibiotic so that we can assess how much/how many diarrhea episodes she is actually having.  They can certainly use Imodium and Pepto-Bismol as needed, which had been discussed in the past.  Question SIBO as a diagnosis as well with her resection/altered anatomy and frequent/recurrent antibiotic use. *Hemorrhoids:  Were irritated this week from the diarrhea.  Improved after using preparation H.  Will prescribe hydrocortisone cream for her to use as needed as well.  Prescription sent to pharmacy.  **I have asked her daughter to call us back in about 3 to 4 weeks with an update on her mother swallowing and her loose stools to see what if any other work-up needs to be performed. **I encouraged er to drink h2-3 Ensure type beverages every day.  **30 minutes were spent with the patient, at least 50% of which was used for discussion of her symptoms, possible diagnoses, treatment options, etc.  CC:  Cirigliano, Garvin Fila, DO

## 2020-05-09 NOTE — Patient Instructions (Addendum)
If you are age 82 or older, your body mass index should be between 23-30. Your Body mass index is 19.11 kg/m. If this is out of the aforementioned range listed, please consider follow up with your Primary Care Provider.  If you are age 5 or younger, your body mass index should be between 19-25. Your Body mass index is 19.11 kg/m. If this is out of the aformentioned range listed, please consider follow up with your Primary Care Provider.   We have sent the following medications to your pharmacy for you to pick up at your convenience: Hydrocortisone 2.5% apply twice daily to rectum area as needed.  Start Florastor probiotic twice daily.   Imodium as needed. Keep a stool diary.  Start drinking 2-3 Ensure daily.  Call with an update in 3-4 weeks and ask for Koren Shiver, RN.

## 2020-05-10 NOTE — Progress Notes (Signed)
I agree with the above note, plan 

## 2020-05-12 ENCOUNTER — Ambulatory Visit (INDEPENDENT_AMBULATORY_CARE_PROVIDER_SITE_OTHER): Payer: Medicare PPO | Admitting: Family Medicine

## 2020-05-12 ENCOUNTER — Other Ambulatory Visit: Payer: Self-pay

## 2020-05-12 ENCOUNTER — Encounter: Payer: Self-pay | Admitting: Family Medicine

## 2020-05-12 VITALS — BP 108/65 | HR 102 | Temp 97.3°F | Wt 122.0 lb

## 2020-05-12 DIAGNOSIS — M8580 Other specified disorders of bone density and structure, unspecified site: Secondary | ICD-10-CM | POA: Diagnosis not present

## 2020-05-12 DIAGNOSIS — I10 Essential (primary) hypertension: Secondary | ICD-10-CM

## 2020-05-12 DIAGNOSIS — K219 Gastro-esophageal reflux disease without esophagitis: Secondary | ICD-10-CM

## 2020-05-12 DIAGNOSIS — D649 Anemia, unspecified: Secondary | ICD-10-CM | POA: Diagnosis not present

## 2020-05-12 DIAGNOSIS — M25511 Pain in right shoulder: Secondary | ICD-10-CM

## 2020-05-12 DIAGNOSIS — E871 Hypo-osmolality and hyponatremia: Secondary | ICD-10-CM

## 2020-05-12 DIAGNOSIS — S43004D Unspecified dislocation of right shoulder joint, subsequent encounter: Secondary | ICD-10-CM | POA: Diagnosis not present

## 2020-05-12 DIAGNOSIS — E039 Hypothyroidism, unspecified: Secondary | ICD-10-CM

## 2020-05-12 DIAGNOSIS — E119 Type 2 diabetes mellitus without complications: Secondary | ICD-10-CM | POA: Diagnosis not present

## 2020-05-12 DIAGNOSIS — F411 Generalized anxiety disorder: Secondary | ICD-10-CM

## 2020-05-12 DIAGNOSIS — E876 Hypokalemia: Secondary | ICD-10-CM

## 2020-05-12 DIAGNOSIS — R197 Diarrhea, unspecified: Secondary | ICD-10-CM

## 2020-05-12 DIAGNOSIS — M810 Age-related osteoporosis without current pathological fracture: Secondary | ICD-10-CM | POA: Diagnosis not present

## 2020-05-12 DIAGNOSIS — S2241XD Multiple fractures of ribs, right side, subsequent encounter for fracture with routine healing: Secondary | ICD-10-CM | POA: Diagnosis not present

## 2020-05-12 MED ORDER — PANTOPRAZOLE SODIUM 40 MG PO TBEC: 40.0000 mg | DELAYED_RELEASE_TABLET | Freq: Every day | ORAL | 1 refills | Status: AC

## 2020-05-12 MED ORDER — MIRTAZAPINE 15 MG PO TABS: ORAL_TABLET | ORAL | 1 refills | Status: DC

## 2020-05-12 MED ORDER — FLUCONAZOLE 150 MG PO TABS: 150.0000 mg | ORAL_TABLET | Freq: Once | ORAL | 1 refills | Status: AC

## 2020-05-12 MED ORDER — POTASSIUM CHLORIDE 20 MEQ/15ML (10%) PO SOLN: 40.0000 meq | Freq: Every day | ORAL | 0 refills | Status: DC

## 2020-05-12 MED ORDER — SACCHAROMYCES BOULARDII 250 MG PO CAPS: 250.0000 mg | ORAL_CAPSULE | Freq: Two times a day (BID) | ORAL | 0 refills | Status: DC

## 2020-05-12 NOTE — Patient Instructions (Addendum)
Great to see you today! Let's change potassium to liquid formulation.  Have labs completed Start florastor twice daily.  Try mirtazapine at bedtime to help with depression and anxiety.  See me again in about 6 weeks.

## 2020-05-12 NOTE — Progress Notes (Signed)
Christy Wade - 84 y.o. female MRN [id number]  Date of birth: 1935/08/21  Subjective No chief complaint on file.   HPI Christy Wade is a 84 y.o. female here today to re-establish care.  She was hospitalized in 02/2020 for hyponatremia.  She had fall and possible seizure.  Sodium 111 upon arrival to hospital.  Started on NS and sodium dropped further.  She was started on salt tabs and sodium repleted slowly.  CXR with multiple rib fractures and separated R shoulder.  He was discharged to rehab facility. Readmitted on 9/7 for hypoxia.  CXR with b/l pleural effusions and b/l atelectasis.  She was diuresed with improvement in O2 requirements.  Discharged back to skilled nursing rehab.  She was seen in clinic last week and diagnosed with UTI.  Urine with pansensitive E. Coli that was treated with omnicef.  She reports that symptoms have improved from this.  She has had some yeast like discharge since completing antibiotic.  She reports feeling anxious and nervous.  She was prescribed alprazolam but reports that this makes her feel very dizzy.   She does not want to try any other medications in this class.     She does continue to have problems with intermittent diarrhea.  This is chronic and she has had evaluation by GI.  She was recommended to try florastor to help with this.  She does have hypokalemia related to her diarrhea and is taking potassium supplement.  They are crushing this daily to give to her.     She has not had her thyroid function checked recently.   ROS:  A comprehensive ROS was completed and negative except as noted per HPI  Allergies  Allergen Reactions  . Codeine Nausea Only    REACTION: nausea  . Sulfamethoxazole Rash    REACTION: rash    Past Medical History:  Diagnosis Date  . Anemia    on iron now  . Cataract   . Hypertension   . Hypothyroidism   . Osteoporosis   . PONV (postoperative nausea and vomiting)   . Thyroid disease   . Varicose vein     Past  Surgical History:  Procedure Laterality Date  . ABDOMINAL HYSTERECTOMY  1975   partial  . APPENDECTOMY  1975   with hysterectomy  . HEMORROIDECTOMY     [phone]  . LAPAROSCOPIC SMALL BOWEL RESECTION N/A 11/29/2014   Procedure: LAPAROSCOPIC SMALL INTESTINE RESECTION;  Surgeon: Michael Boston, MD;  Location: [facility];  Service: General;  Laterality: N/A;  . VARICOSE VEIN SURGERY Left 1971    Social History   Socioeconomic History  . Marital status: Widowed    Spouse name: Not on file  . Number of children: Not on file  . Years of education: Not on file  . Highest education level: Not on file  Occupational History  . Occupation: Retired  Tobacco Use  . Smoking status: Never Smoker  . Smokeless tobacco: Never Used  Vaping Use  . Vaping Use: Never used  Substance and Sexual Activity  . Alcohol use: No    Alcohol/week: 0.0 standard drinks  . Drug use: No  . Sexual activity: Not Currently  Other Topics Concern  . Not on file  Social History Narrative  . Not on file   Social Determinants of Health   Financial Resource Strain: Low Risk   . Difficulty of Paying Living Expenses: Not hard at all  Food Insecurity: No Food Insecurity  . Worried About Running  Out of Food in the Last Year: Never true  . Ran Out of Food in the Last Year: Never true  Transportation Needs: No Transportation Needs  . Lack of Transportation (Medical): No  . Lack of Transportation (Non-Medical): No  Physical Activity: Inactive  . Days of Exercise per Week: 0 days  . Minutes of Exercise per Session: 0 min  Stress: No Stress Concern Present  . Feeling of Stress : Not at all  Social Connections: Moderately Integrated  . Frequency of Communication with Friends and Family: More than three times a week  . Frequency of Social Gatherings with Friends and Family: More than three times a week  . Attends Religious Services: More than 4 times per year  . Active Member of Clubs or Organizations: Yes  .  Attends Archivist Meetings: More than 4 times per year  . Marital Status: Widowed    Family History  Problem Relation Age of Onset  . Bone cancer Mother   . Diabetes Father   . Stroke Father   . Breast cancer Sister   . Stomach cancer Neg Hx   . Colon cancer Neg Hx     Health Maintenance  Topic Date Due  . INFLUENZA VACCINE  02/17/2020  . TETANUS/TDAP  05/30/2028  . DEXA SCAN  Completed  . COVID-19 Vaccine  Completed  . PNA vac Low Risk Adult  Completed     ----------------------------------------------------------------------------------------------------------------------------------------------------------------------------------------------------------------- Physical Exam BP 108/65 (BP Location: Left Arm, Patient Position: Sitting, Cuff Size: Normal)   Pulse (!) 102   Temp (!) 97.3 F (36.3 C)   Wt 122 lb (55.3 kg)   SpO2 98%   BMI 19.11 kg/m   Physical Exam  ------------------------------------------------------------------------------------------------------------------------------------------------------------------------------------------------------------------- Assessment and Plan  Essential hypertension BP is on the low end of normal.  She is having some increased swelling in her legs which may be related to amlodipine and/or hydralazine use.  Will have her decrease amlodipine to 5mg  daily.   Hypothyroidism Due for updated TSH, ordered today.   Diarrhea Recurrent issue. Evaluated by GI without any pathologic cause.  Start florastor as recommended by GI.    Hyponatremia Recheck sodium levels.  These have remained stable recently.   She may need additional f/u with nephrology if these remain low.    GAD (generalized anxiety disorder) She is having side effects with prescribed benzodiazepine.  Discussed alternatives, limited due to SSRI's potential to cause hyponatremia.   Will try mirtazapine as this has lower risk for hyponatremia  compared to other SSRI's  Hypokalemia She is crushing controlled release, potassium, discussed she should not take this way.  She is unable to swallow pills.  Will change to liquid formulation.      Right shoulder pain Separated R shoulder, not a candidate for surgical management of this.   Continue with HH PT.     Meds ordered this encounter  Medications  . potassium chloride 20 MEQ/15ML (10%) SOLN    Sig: Take 30 mLs (40 mEq total) by mouth daily.    Dispense:  2700 mL    Refill:  0  . mirtazapine (REMERON) 15 MG tablet    Sig: Take 1/2 tab x1 week then increase to full tab.    Dispense:  90 tablet    Refill:  1  . saccharomyces boulardii (FLORASTOR) 250 MG capsule    Sig: Take 1 capsule (250 mg total) by mouth 2 (two) times daily.    Dispense:  60 capsule    Refill:  0  . pantoprazole (PROTONIX) 40 MG tablet    Sig: Take 1 tablet (40 mg total) by mouth daily.    Dispense:  90 tablet    Refill:  1  . fluconazole (DIFLUCAN) 150 MG tablet    Sig: Take 1 tablet (150 mg total) by mouth once for 1 dose. May repeat after 72 hours if needed.    Dispense:  1 tablet    Refill:  1    Return in about 6 weeks (around 06/23/2020) for HTN/Fatigue.    This visit occurred during the SARS-CoV-2 public health emergency.  Safety protocols were in place, including screening questions prior to the visit, additional usage of staff PPE, and extensive cleaning of exam room while observing appropriate contact time as indicated for disinfecting solutions.

## 2020-05-13 ENCOUNTER — Other Ambulatory Visit: Payer: Self-pay | Admitting: Family Medicine

## 2020-05-13 ENCOUNTER — Telehealth: Payer: Self-pay

## 2020-05-13 DIAGNOSIS — M8580 Other specified disorders of bone density and structure, unspecified site: Secondary | ICD-10-CM | POA: Diagnosis not present

## 2020-05-13 DIAGNOSIS — F411 Generalized anxiety disorder: Secondary | ICD-10-CM | POA: Diagnosis not present

## 2020-05-13 DIAGNOSIS — E039 Hypothyroidism, unspecified: Secondary | ICD-10-CM | POA: Diagnosis not present

## 2020-05-13 DIAGNOSIS — S43004D Unspecified dislocation of right shoulder joint, subsequent encounter: Secondary | ICD-10-CM | POA: Diagnosis not present

## 2020-05-13 DIAGNOSIS — I10 Essential (primary) hypertension: Secondary | ICD-10-CM | POA: Diagnosis not present

## 2020-05-13 DIAGNOSIS — M810 Age-related osteoporosis without current pathological fracture: Secondary | ICD-10-CM | POA: Diagnosis not present

## 2020-05-13 DIAGNOSIS — E119 Type 2 diabetes mellitus without complications: Secondary | ICD-10-CM | POA: Diagnosis not present

## 2020-05-13 DIAGNOSIS — S2241XD Multiple fractures of ribs, right side, subsequent encounter for fracture with routine healing: Secondary | ICD-10-CM | POA: Diagnosis not present

## 2020-05-13 DIAGNOSIS — D649 Anemia, unspecified: Secondary | ICD-10-CM | POA: Diagnosis not present

## 2020-05-13 LAB — COMPLETE METABOLIC PANEL WITH GFR
AG Ratio: 1 (calc) (ref 1.0–2.5)
ALT: 31 U/L — ABNORMAL HIGH (ref 6–29)
AST: 33 U/L (ref 10–35)
Albumin: 3.5 g/dL — ABNORMAL LOW (ref 3.6–5.1)
Alkaline phosphatase (APISO): 96 U/L (ref 37–153)
BUN/Creatinine Ratio: 15 (calc) (ref 6–22)
BUN: 16 mg/dL (ref 7–25)
CO2: 30 mmol/L (ref 20–32)
Calcium: 8.9 mg/dL (ref 8.6–10.4)
Chloride: 90 mmol/L — ABNORMAL LOW (ref 98–110)
Creat: 1.04 mg/dL — ABNORMAL HIGH (ref 0.60–0.88)
GFR, Est African American: 58 mL/min/{1.73_m2} — ABNORMAL LOW (ref >=60)
GFR, Est Non African American: 50 mL/min/{1.73_m2} — ABNORMAL LOW (ref >=60)
Globulin: 3.5 g/dL (calc) (ref 1.9–3.7)
Glucose, Bld: 84 mg/dL (ref 65–139)
Potassium: 3.7 mmol/L (ref 3.5–5.3)
Sodium: 130 mmol/L — ABNORMAL LOW (ref 135–146)
Total Bilirubin: 0.7 mg/dL (ref 0.2–1.2)
Total Protein: 7 g/dL (ref 6.1–8.1)

## 2020-05-13 LAB — CBC
HCT: 35 % (ref 35.0–45.0)
Hemoglobin: 11.8 g/dL (ref 11.7–15.5)
MCH: 30 pg (ref 27.0–33.0)
MCHC: 33.7 g/dL (ref 32.0–36.0)
MCV: 89.1 fL (ref 80.0–100.0)
MPV: 9.6 fL (ref 7.5–12.5)
Platelets: 277 10*3/uL (ref 140–400)
RBC: 3.93 10*6/uL (ref 3.80–5.10)
RDW: 14.3 % (ref 11.0–15.0)
WBC: 13 10*3/uL — ABNORMAL HIGH (ref 3.8–10.8)

## 2020-05-13 LAB — TSH: TSH: 8.34 mIU/L — ABNORMAL HIGH (ref 0.40–4.50)

## 2020-05-13 MED ORDER — AMLODIPINE BESYLATE 5 MG PO TABS: 10.0000 mg | ORAL_TABLET | Freq: Every day | ORAL | 1 refills | Status: DC

## 2020-05-13 NOTE — Telephone Encounter (Signed)
Left message advising of recommendations.  

## 2020-05-13 NOTE — Telephone Encounter (Signed)
Colletta Maryland with [facility] called to review medication list. I advised of current medication list. She states Christy Wade also picked up amlodipine 5 mg and Lisinopril 20 mg from the pharmacy. I advised Lisinopril 20 mg was not on current medication list. Also that the Amlodipine is 10 mg.

## 2020-05-13 NOTE — Telephone Encounter (Signed)
Lisinopril is not on current medications.  Would advise holding this.   Amlodipine should be 5mg .  Updated in [place].   Thanks!  CM

## 2020-05-14 ENCOUNTER — Other Ambulatory Visit: Payer: Self-pay

## 2020-05-14 ENCOUNTER — Other Ambulatory Visit: Payer: Self-pay | Admitting: Family Medicine

## 2020-05-14 DIAGNOSIS — F411 Generalized anxiety disorder: Secondary | ICD-10-CM | POA: Insufficient documentation

## 2020-05-14 DIAGNOSIS — E039 Hypothyroidism, unspecified: Secondary | ICD-10-CM

## 2020-05-14 DIAGNOSIS — E876 Hypokalemia: Secondary | ICD-10-CM | POA: Insufficient documentation

## 2020-05-14 DIAGNOSIS — M25511 Pain in right shoulder: Secondary | ICD-10-CM | POA: Insufficient documentation

## 2020-05-14 MED ORDER — LEVOTHYROXINE SODIUM 88 MCG PO TABS: 88.0000 ug | ORAL_TABLET | Freq: Every day | ORAL | 1 refills | Status: DC

## 2020-05-14 NOTE — Telephone Encounter (Signed)
She needs a refill on Levothyroxine.

## 2020-05-14 NOTE — Assessment & Plan Note (Signed)
BP is on the low end of normal.  She is having some increased swelling in her legs which may be related to amlodipine and/or hydralazine use.  Will have her decrease amlodipine to 5mg  daily.

## 2020-05-14 NOTE — Telephone Encounter (Signed)
Family advised of recommendations.

## 2020-05-14 NOTE — Assessment & Plan Note (Signed)
Separated R shoulder, not a candidate for surgical management of this.   Continue with [facility] PT.

## 2020-05-14 NOTE — Assessment & Plan Note (Signed)
She is crushing controlled release, potassium, discussed she should not take this way.  She is unable to swallow pills.  Will change to liquid formulation.

## 2020-05-14 NOTE — Assessment & Plan Note (Signed)
Recurrent issue. Evaluated by GI without any pathologic cause.  Start florastor as recommended by GI.

## 2020-05-14 NOTE — Assessment & Plan Note (Signed)
Recheck sodium levels.  These have remained stable recently.   She may need additional f/u with nephrology if these remain low.

## 2020-05-14 NOTE — Assessment & Plan Note (Signed)
She is having side effects with prescribed benzodiazepine.  Discussed alternatives, limited due to SSRI's potential to cause hyponatremia.   Will try mirtazapine as this has lower risk for hyponatremia compared to other [facility]

## 2020-05-14 NOTE — Assessment & Plan Note (Signed)
Due for updated TSH, ordered today.

## 2020-05-16 DIAGNOSIS — D649 Anemia, unspecified: Secondary | ICD-10-CM | POA: Diagnosis not present

## 2020-05-16 DIAGNOSIS — I10 Essential (primary) hypertension: Secondary | ICD-10-CM | POA: Diagnosis not present

## 2020-05-16 DIAGNOSIS — E039 Hypothyroidism, unspecified: Secondary | ICD-10-CM | POA: Diagnosis not present

## 2020-05-16 DIAGNOSIS — F411 Generalized anxiety disorder: Secondary | ICD-10-CM | POA: Diagnosis not present

## 2020-05-16 DIAGNOSIS — E119 Type 2 diabetes mellitus without complications: Secondary | ICD-10-CM | POA: Diagnosis not present

## 2020-05-16 DIAGNOSIS — S2241XD Multiple fractures of ribs, right side, subsequent encounter for fracture with routine healing: Secondary | ICD-10-CM | POA: Diagnosis not present

## 2020-05-16 DIAGNOSIS — M810 Age-related osteoporosis without current pathological fracture: Secondary | ICD-10-CM | POA: Diagnosis not present

## 2020-05-16 DIAGNOSIS — M8580 Other specified disorders of bone density and structure, unspecified site: Secondary | ICD-10-CM | POA: Diagnosis not present

## 2020-05-16 DIAGNOSIS — S43004D Unspecified dislocation of right shoulder joint, subsequent encounter: Secondary | ICD-10-CM | POA: Diagnosis not present

## 2020-05-19 DIAGNOSIS — I10 Essential (primary) hypertension: Secondary | ICD-10-CM | POA: Diagnosis not present

## 2020-05-19 DIAGNOSIS — M810 Age-related osteoporosis without current pathological fracture: Secondary | ICD-10-CM | POA: Diagnosis not present

## 2020-05-19 DIAGNOSIS — S2241XD Multiple fractures of ribs, right side, subsequent encounter for fracture with routine healing: Secondary | ICD-10-CM | POA: Diagnosis not present

## 2020-05-19 DIAGNOSIS — M8580 Other specified disorders of bone density and structure, unspecified site: Secondary | ICD-10-CM | POA: Diagnosis not present

## 2020-05-19 DIAGNOSIS — E119 Type 2 diabetes mellitus without complications: Secondary | ICD-10-CM | POA: Diagnosis not present

## 2020-05-19 DIAGNOSIS — E039 Hypothyroidism, unspecified: Secondary | ICD-10-CM | POA: Diagnosis not present

## 2020-05-19 DIAGNOSIS — D649 Anemia, unspecified: Secondary | ICD-10-CM | POA: Diagnosis not present

## 2020-05-19 DIAGNOSIS — F411 Generalized anxiety disorder: Secondary | ICD-10-CM | POA: Diagnosis not present

## 2020-05-19 DIAGNOSIS — S43004D Unspecified dislocation of right shoulder joint, subsequent encounter: Secondary | ICD-10-CM | POA: Diagnosis not present

## 2020-05-20 DIAGNOSIS — M8580 Other specified disorders of bone density and structure, unspecified site: Secondary | ICD-10-CM | POA: Diagnosis not present

## 2020-05-20 DIAGNOSIS — M810 Age-related osteoporosis without current pathological fracture: Secondary | ICD-10-CM | POA: Diagnosis not present

## 2020-05-20 DIAGNOSIS — S2241XD Multiple fractures of ribs, right side, subsequent encounter for fracture with routine healing: Secondary | ICD-10-CM | POA: Diagnosis not present

## 2020-05-20 DIAGNOSIS — F411 Generalized anxiety disorder: Secondary | ICD-10-CM | POA: Diagnosis not present

## 2020-05-20 DIAGNOSIS — E119 Type 2 diabetes mellitus without complications: Secondary | ICD-10-CM | POA: Diagnosis not present

## 2020-05-20 DIAGNOSIS — D649 Anemia, unspecified: Secondary | ICD-10-CM | POA: Diagnosis not present

## 2020-05-20 DIAGNOSIS — E039 Hypothyroidism, unspecified: Secondary | ICD-10-CM | POA: Diagnosis not present

## 2020-05-20 DIAGNOSIS — S43004D Unspecified dislocation of right shoulder joint, subsequent encounter: Secondary | ICD-10-CM | POA: Diagnosis not present

## 2020-05-20 DIAGNOSIS — I10 Essential (primary) hypertension: Secondary | ICD-10-CM | POA: Diagnosis not present

## 2020-05-21 DIAGNOSIS — E119 Type 2 diabetes mellitus without complications: Secondary | ICD-10-CM | POA: Diagnosis not present

## 2020-05-21 DIAGNOSIS — F411 Generalized anxiety disorder: Secondary | ICD-10-CM | POA: Diagnosis not present

## 2020-05-21 DIAGNOSIS — M8580 Other specified disorders of bone density and structure, unspecified site: Secondary | ICD-10-CM | POA: Diagnosis not present

## 2020-05-21 DIAGNOSIS — S2241XD Multiple fractures of ribs, right side, subsequent encounter for fracture with routine healing: Secondary | ICD-10-CM | POA: Diagnosis not present

## 2020-05-21 DIAGNOSIS — M810 Age-related osteoporosis without current pathological fracture: Secondary | ICD-10-CM | POA: Diagnosis not present

## 2020-05-21 DIAGNOSIS — E039 Hypothyroidism, unspecified: Secondary | ICD-10-CM | POA: Diagnosis not present

## 2020-05-21 DIAGNOSIS — I10 Essential (primary) hypertension: Secondary | ICD-10-CM | POA: Diagnosis not present

## 2020-05-21 DIAGNOSIS — S43004D Unspecified dislocation of right shoulder joint, subsequent encounter: Secondary | ICD-10-CM | POA: Diagnosis not present

## 2020-05-21 DIAGNOSIS — D649 Anemia, unspecified: Secondary | ICD-10-CM | POA: Diagnosis not present

## 2020-05-26 ENCOUNTER — Other Ambulatory Visit: Payer: Self-pay

## 2020-05-26 ENCOUNTER — Telehealth: Payer: Self-pay

## 2020-05-26 DIAGNOSIS — F411 Generalized anxiety disorder: Secondary | ICD-10-CM | POA: Diagnosis not present

## 2020-05-26 DIAGNOSIS — S43004D Unspecified dislocation of right shoulder joint, subsequent encounter: Secondary | ICD-10-CM | POA: Diagnosis not present

## 2020-05-26 DIAGNOSIS — M8580 Other specified disorders of bone density and structure, unspecified site: Secondary | ICD-10-CM | POA: Diagnosis not present

## 2020-05-26 DIAGNOSIS — I10 Essential (primary) hypertension: Secondary | ICD-10-CM

## 2020-05-26 DIAGNOSIS — I5032 Chronic diastolic (congestive) heart failure: Secondary | ICD-10-CM

## 2020-05-26 DIAGNOSIS — D649 Anemia, unspecified: Secondary | ICD-10-CM | POA: Diagnosis not present

## 2020-05-26 DIAGNOSIS — M810 Age-related osteoporosis without current pathological fracture: Secondary | ICD-10-CM | POA: Diagnosis not present

## 2020-05-26 DIAGNOSIS — E119 Type 2 diabetes mellitus without complications: Secondary | ICD-10-CM | POA: Diagnosis not present

## 2020-05-26 DIAGNOSIS — E039 Hypothyroidism, unspecified: Secondary | ICD-10-CM | POA: Diagnosis not present

## 2020-05-26 DIAGNOSIS — S2241XD Multiple fractures of ribs, right side, subsequent encounter for fracture with routine healing: Secondary | ICD-10-CM | POA: Diagnosis not present

## 2020-05-26 MED ORDER — FUROSEMIDE 40 MG PO TABS: 40.0000 mg | ORAL_TABLET | Freq: Every day | ORAL | 0 refills | Status: DC

## 2020-05-26 MED ORDER — HYDRALAZINE HCL 25 MG PO TABS: 25.0000 mg | ORAL_TABLET | Freq: Three times a day (TID) | ORAL | 0 refills | Status: DC

## 2020-05-26 NOTE — Telephone Encounter (Signed)
Danae Chen of [facility] called stating Christy Wade has been experiencing nightmares since increasing Remeron.   Can she decrease Remeron to 1/2 tab or stop?  Ms. Presswood is also experiencing intermittent diarrhea, morning confusion, and altered mental status until she's 'up and moving'. Danae Chen is concerned it my be a UTI.  Please advise.

## 2020-05-26 NOTE — Telephone Encounter (Signed)
Advised caretaker to decrease remeron to 1/2 tab. She agreed.   [facility] will not be there tomorrow. Advised a urine sample was needed. We're available until 4pm.  Family is to call tomorrow with an update if someone cannot bring here to the office for a sample.

## 2020-05-26 NOTE — Telephone Encounter (Signed)
Ok to decrease mirtazapine to 1/2 tab.   Can [facility] obtain a urine specimen?

## 2020-05-27 ENCOUNTER — Other Ambulatory Visit: Payer: Self-pay

## 2020-05-27 ENCOUNTER — Encounter: Payer: Self-pay | Admitting: Family Medicine

## 2020-05-27 ENCOUNTER — Ambulatory Visit (INDEPENDENT_AMBULATORY_CARE_PROVIDER_SITE_OTHER): Payer: Medicare PPO | Admitting: Family Medicine

## 2020-05-27 DIAGNOSIS — F411 Generalized anxiety disorder: Secondary | ICD-10-CM

## 2020-05-27 DIAGNOSIS — Z9181 History of falling: Secondary | ICD-10-CM | POA: Diagnosis not present

## 2020-05-27 DIAGNOSIS — N3000 Acute cystitis without hematuria: Secondary | ICD-10-CM | POA: Diagnosis not present

## 2020-05-27 DIAGNOSIS — S43004D Unspecified dislocation of right shoulder joint, subsequent encounter: Secondary | ICD-10-CM | POA: Diagnosis not present

## 2020-05-27 DIAGNOSIS — S2241XD Multiple fractures of ribs, right side, subsequent encounter for fracture with routine healing: Secondary | ICD-10-CM | POA: Diagnosis not present

## 2020-05-27 DIAGNOSIS — R2689 Other abnormalities of gait and mobility: Secondary | ICD-10-CM | POA: Diagnosis not present

## 2020-05-27 DIAGNOSIS — R2681 Unsteadiness on feet: Secondary | ICD-10-CM | POA: Diagnosis not present

## 2020-05-27 DIAGNOSIS — N390 Urinary tract infection, site not specified: Secondary | ICD-10-CM | POA: Insufficient documentation

## 2020-05-27 DIAGNOSIS — M6281 Muscle weakness (generalized): Secondary | ICD-10-CM | POA: Diagnosis not present

## 2020-05-27 LAB — POCT URINALYSIS DIP (CLINITEK)
Bilirubin, UA: NEGATIVE
Blood, UA: NEGATIVE
Glucose, UA: NEGATIVE mg/dL
Ketones, POC UA: NEGATIVE mg/dL
Nitrite, UA: POSITIVE — AB
Spec Grav, UA: 1.02 (ref 1.010–1.025)
Urobilinogen, UA: 0.2 E.U./dL
pH, UA: 6 (ref 5.0–8.0)

## 2020-05-27 MED ORDER — FLUCONAZOLE 150 MG PO TABS: 150.0000 mg | ORAL_TABLET | Freq: Once | ORAL | 0 refills | Status: AC

## 2020-05-27 MED ORDER — CEPHALEXIN 500 MG PO CAPS: 500.0000 mg | ORAL_CAPSULE | Freq: Two times a day (BID) | ORAL | 0 refills | Status: AC

## 2020-05-27 NOTE — Progress Notes (Signed)
Patient presented with UTI symptoms for POCT urinalysis.   Results given to Dr. Zigmund Daniel.

## 2020-05-27 NOTE — Progress Notes (Signed)
Christy Wade - 84 y.o. female MRN [id number]  Date of birth: Dec 04, 1935  Subjective Chief Complaint  Patient presents with  . Urinary Tract Infection    HPI Christy Wade is a 84 y.o. female here today with complaint of possible UTI.  Phone call received from Home health agency  that she was a little more confused, especially first thing in the morning.  She denies dysuria, frequency or urgency.  She has had some increased diarrhea recently which is not uncommon for her.  She does not feel fatigued and denies flank pain or fever.   She also feels like remeron is causing her to have increased nightmares.  She would like to discontinue this.    ROS:  A comprehensive ROS was completed and negative except as noted per HPI  Allergies  Allergen Reactions  . Codeine Nausea Only    REACTION: nausea  . Sulfamethoxazole Rash    REACTION: rash    Past Medical History:  Diagnosis Date  . Anemia    on iron now  . Cataract   . Hypertension   . Hypothyroidism   . Osteoporosis   . PONV (postoperative nausea and vomiting)   . Thyroid disease   . Varicose vein     Past Surgical History:  Procedure Laterality Date  . ABDOMINAL HYSTERECTOMY  1975   partial  . APPENDECTOMY  1975   with hysterectomy  . HEMORROIDECTOMY     [phone]  . LAPAROSCOPIC SMALL BOWEL RESECTION N/A 11/29/2014   Procedure: LAPAROSCOPIC SMALL INTESTINE RESECTION;  Surgeon: Michael Boston, MD;  Location: [facility];  Service: General;  Laterality: N/A;  . VARICOSE VEIN SURGERY Left 1971    Social History   Socioeconomic History  . Marital status: Widowed    Spouse name: Not on file  . Number of children: Not on file  . Years of education: Not on file  . Highest education level: Not on file  Occupational History  . Occupation: Retired  Tobacco Use  . Smoking status: Never Smoker  . Smokeless tobacco: Never Used  Vaping Use  . Vaping Use: Never used  Substance and Sexual Activity  . Alcohol use: No     Alcohol/week: 0.0 standard drinks  . Drug use: No  . Sexual activity: Not Currently  Other Topics Concern  . Not on file  Social History Narrative  . Not on file   Social Determinants of Health   Financial Resource Strain: Low Risk   . Difficulty of Paying Living Expenses: Not hard at all  Food Insecurity: No Food Insecurity  . Worried About Charity fundraiser in the Last Year: Never true  . Ran Out of Food in the Last Year: Never true  Transportation Needs: No Transportation Needs  . Lack of Transportation (Medical): No  . Lack of Transportation (Non-Medical): No  Physical Activity: Inactive  . Days of Exercise per Week: 0 days  . Minutes of Exercise per Session: 0 min  Stress: No Stress Concern Present  . Feeling of Stress : Not at all  Social Connections: Moderately Integrated  . Frequency of Communication with Friends and Family: More than three times a week  . Frequency of Social Gatherings with Friends and Family: More than three times a week  . Attends Religious Services: More than 4 times per year  . Active Member of Clubs or Organizations: Yes  . Attends Archivist Meetings: More than 4 times per year  . Marital Status: Widowed  Family History  Problem Relation Age of Onset  . Bone cancer Mother   . Diabetes Father   . Stroke Father   . Breast cancer Sister   . Stomach cancer Neg Hx   . Colon cancer Neg Hx     Health Maintenance  Topic Date Due  . INFLUENZA VACCINE  02/17/2020  . TETANUS/TDAP  05/30/2028  . DEXA SCAN  Completed  . COVID-19 Vaccine  Completed  . PNA vac Low Risk Adult  Completed     ----------------------------------------------------------------------------------------------------------------------------------------------------------------------------------------------------------------- Physical Exam BP 138/78 (BP Location: Left Arm, Patient Position: Sitting, Cuff Size: Normal)   Pulse 97   Temp (!) 97.5 F (36.4 C)  (Oral)   Wt 122 lb (55.3 kg)   SpO2 100%   BMI 19.11 kg/m   Physical Exam Constitutional:      Appearance: Normal appearance.  HENT:     Head: Normocephalic and atraumatic.  Cardiovascular:     Rate and Rhythm: Normal rate and regular rhythm.  Skin:    General: Skin is warm and dry.  Neurological:     Mental Status: She is alert.  Psychiatric:        Mood and Affect: Mood normal.        Behavior: Behavior normal.     ------------------------------------------------------------------------------------------------------------------------------------------------------------------------------------------------------------------- Assessment and Plan  GAD (generalized anxiety disorder) She seems to be having increased nightmares related to mirtazapine.  She feels like anxiety is ok right now.  She will discontinue mirtazapine.    UTI (urinary tract infection) Will treat with cephalexin 500mg  BID x5 days.  Urine sent for culture.  Diflucan for antibiotic associated yeast vaginitis.  Instructed to call if developing new/worsening symptoms.    Meds ordered this encounter  Medications  . cephALEXin (KEFLEX) 500 MG capsule    Sig: Take 1 capsule (500 mg total) by mouth 2 (two) times daily for 5 days.    Dispense:  10 capsule    Refill:  0  . fluconazole (DIFLUCAN) 150 MG tablet    Sig: Take 1 tablet (150 mg total) by mouth once for 1 dose.    Dispense:  1 tablet    Refill:  0    Return for UTI.    This visit occurred during the SARS-CoV-2 public health emergency.  Safety protocols were in place, including screening questions prior to the visit, additional usage of staff PPE, and extensive cleaning of exam room while observing appropriate contact time as indicated for disinfecting solutions.

## 2020-05-27 NOTE — Assessment & Plan Note (Signed)
She seems to be having increased nightmares related to mirtazapine.  She feels like anxiety is ok right now.  She will discontinue mirtazapine.

## 2020-05-27 NOTE — Assessment & Plan Note (Signed)
Will treat with cephalexin 500mg  BID x5 days.  Urine sent for culture.  Diflucan for antibiotic associated yeast vaginitis.  Instructed to call if developing new/worsening symptoms.

## 2020-05-29 DIAGNOSIS — F411 Generalized anxiety disorder: Secondary | ICD-10-CM | POA: Diagnosis not present

## 2020-05-29 DIAGNOSIS — D649 Anemia, unspecified: Secondary | ICD-10-CM | POA: Diagnosis not present

## 2020-05-29 DIAGNOSIS — S43004D Unspecified dislocation of right shoulder joint, subsequent encounter: Secondary | ICD-10-CM | POA: Diagnosis not present

## 2020-05-29 DIAGNOSIS — E039 Hypothyroidism, unspecified: Secondary | ICD-10-CM | POA: Diagnosis not present

## 2020-05-29 DIAGNOSIS — M8580 Other specified disorders of bone density and structure, unspecified site: Secondary | ICD-10-CM | POA: Diagnosis not present

## 2020-05-29 DIAGNOSIS — E119 Type 2 diabetes mellitus without complications: Secondary | ICD-10-CM | POA: Diagnosis not present

## 2020-05-29 DIAGNOSIS — I10 Essential (primary) hypertension: Secondary | ICD-10-CM | POA: Diagnosis not present

## 2020-05-29 DIAGNOSIS — M810 Age-related osteoporosis without current pathological fracture: Secondary | ICD-10-CM | POA: Diagnosis not present

## 2020-05-29 DIAGNOSIS — S2241XD Multiple fractures of ribs, right side, subsequent encounter for fracture with routine healing: Secondary | ICD-10-CM | POA: Diagnosis not present

## 2020-05-30 DIAGNOSIS — M810 Age-related osteoporosis without current pathological fracture: Secondary | ICD-10-CM | POA: Diagnosis not present

## 2020-05-30 DIAGNOSIS — E119 Type 2 diabetes mellitus without complications: Secondary | ICD-10-CM | POA: Diagnosis not present

## 2020-05-30 DIAGNOSIS — D649 Anemia, unspecified: Secondary | ICD-10-CM | POA: Diagnosis not present

## 2020-05-30 DIAGNOSIS — M8580 Other specified disorders of bone density and structure, unspecified site: Secondary | ICD-10-CM | POA: Diagnosis not present

## 2020-05-30 DIAGNOSIS — S2241XD Multiple fractures of ribs, right side, subsequent encounter for fracture with routine healing: Secondary | ICD-10-CM | POA: Diagnosis not present

## 2020-05-30 DIAGNOSIS — E039 Hypothyroidism, unspecified: Secondary | ICD-10-CM | POA: Diagnosis not present

## 2020-05-30 DIAGNOSIS — S43004D Unspecified dislocation of right shoulder joint, subsequent encounter: Secondary | ICD-10-CM | POA: Diagnosis not present

## 2020-05-30 DIAGNOSIS — F411 Generalized anxiety disorder: Secondary | ICD-10-CM | POA: Diagnosis not present

## 2020-05-30 DIAGNOSIS — I10 Essential (primary) hypertension: Secondary | ICD-10-CM | POA: Diagnosis not present

## 2020-06-01 ENCOUNTER — Other Ambulatory Visit: Payer: Self-pay | Admitting: Family

## 2020-06-01 DIAGNOSIS — I5032 Chronic diastolic (congestive) heart failure: Secondary | ICD-10-CM

## 2020-06-01 DIAGNOSIS — I10 Essential (primary) hypertension: Secondary | ICD-10-CM

## 2020-06-02 ENCOUNTER — Encounter: Payer: Self-pay | Admitting: Family Medicine

## 2020-06-02 ENCOUNTER — Ambulatory Visit (INDEPENDENT_AMBULATORY_CARE_PROVIDER_SITE_OTHER): Payer: Medicare PPO | Admitting: Family Medicine

## 2020-06-02 VITALS — BP 120/74 | HR 112 | Ht 67.0 in | Wt 122.0 lb

## 2020-06-02 DIAGNOSIS — I10 Essential (primary) hypertension: Secondary | ICD-10-CM

## 2020-06-02 DIAGNOSIS — E871 Hypo-osmolality and hyponatremia: Secondary | ICD-10-CM | POA: Diagnosis not present

## 2020-06-02 DIAGNOSIS — K529 Noninfective gastroenteritis and colitis, unspecified: Secondary | ICD-10-CM | POA: Diagnosis not present

## 2020-06-02 DIAGNOSIS — N390 Urinary tract infection, site not specified: Secondary | ICD-10-CM

## 2020-06-02 DIAGNOSIS — M25511 Pain in right shoulder: Secondary | ICD-10-CM | POA: Diagnosis not present

## 2020-06-02 DIAGNOSIS — R195 Other fecal abnormalities: Secondary | ICD-10-CM | POA: Diagnosis not present

## 2020-06-02 DIAGNOSIS — E441 Mild protein-calorie malnutrition: Secondary | ICD-10-CM

## 2020-06-02 DIAGNOSIS — E46 Unspecified protein-calorie malnutrition: Secondary | ICD-10-CM | POA: Insufficient documentation

## 2020-06-02 LAB — POCT URINALYSIS DIP (CLINITEK)
Bilirubin, UA: NEGATIVE
Blood, UA: NEGATIVE
Glucose, UA: NEGATIVE mg/dL
Ketones, POC UA: NEGATIVE mg/dL
Nitrite, UA: POSITIVE — AB
POC PROTEIN,UA: NEGATIVE
Spec Grav, UA: 1.015 (ref 1.010–1.025)
Urobilinogen, UA: 0.2 E.U./dL
pH, UA: 6 (ref 5.0–8.0)

## 2020-06-02 MED ORDER — MIRTAZAPINE 15 MG PO TABS
7.5000 mg | ORAL_TABLET | Freq: Every day | ORAL | 1 refills | Status: DC
Start: 2020-06-02 — End: 2022-05-13

## 2020-06-02 MED ORDER — NITROFURANTOIN MONOHYD MACRO 100 MG PO CAPS: 100.0000 mg | ORAL_CAPSULE | Freq: Two times a day (BID) | ORAL | 0 refills | Status: DC

## 2020-06-02 MED ORDER — MIRTAZAPINE 15 MG PO TABS
7.5000 mg | ORAL_TABLET | Freq: Every day | ORAL | 1 refills | Status: DC
Start: 2020-06-02 — End: 2020-06-02

## 2020-06-02 MED ORDER — AMLODIPINE BESYLATE 5 MG PO TABS: 5.0000 mg | ORAL_TABLET | Freq: Every day | ORAL | 1 refills | Status: DC

## 2020-06-02 MED ORDER — DIPHENOXYLATE-ATROPINE 2.5-0.025 MG PO TABS: ORAL_TABLET | ORAL | 0 refills | Status: DC

## 2020-06-02 NOTE — Assessment & Plan Note (Signed)
Decreased appetite.   Adding mirtazapine back on to help with appetite as her nightmares didn't really improve with stopping this.

## 2020-06-02 NOTE — Patient Instructions (Addendum)
Restart mirtazapine, 1/2 tablet daily.  Use lomotil as needed for severe diarrhea.  Try immodium before using this.   Start nitrofurantoin for urinary tract infection . Have labs completed downstairs.  See me again in about 7-10 days.

## 2020-06-02 NOTE — Assessment & Plan Note (Signed)
She has not had much improvement with cephalexin.  Will change to macrobid.   Urine sent for culture.

## 2020-06-02 NOTE — Assessment & Plan Note (Signed)
Continued diarrhea and loose stools.  Chronic issue.  Only mild improvement with immodium.  Continue probiotic.  Recheck C. Dff.  Will add lomotil to use sparingly for severe diarrhea.

## 2020-06-02 NOTE — Assessment & Plan Note (Signed)
Having some transient alterations in mentation, likely related to UTI.  Will update sodium levels.

## 2020-06-02 NOTE — Progress Notes (Signed)
Christy Wade - 84 y.o. female MRN [id number]  Date of birth: Aug 23, 1935  Subjective Chief Complaint  Patient presents with  . Urinary Tract Infection    HPI Christy Wade is a 84 y.o. female here today for follow up of UTI.  She is accompanied by care giver and son in law today.  They report that she continues to have some confusion and fatigue.  She has been taking cepahlexin since last visit.  Unfortunately last urine was not sent for culture.  She continues to have foul odor to urine, frequency and incontinence.  Her appetite is decreased.   She also continues to have loose stools and diarrhea.  Using probiotic and immodium without much improvement.    She also complains of continued nightmares.  These do not occur every night.  These have not improved since stopping mirtazapine.  ROS:  A comprehensive ROS was completed and negative except as noted per HPI  Allergies  Allergen Reactions  . Codeine Nausea Only    REACTION: nausea  . Sulfamethoxazole Rash    REACTION: rash    Past Medical History:  Diagnosis Date  . Anemia    on iron now  . Cataract   . Hypertension   . Hypothyroidism   . Osteoporosis   . PONV (postoperative nausea and vomiting)   . Thyroid disease   . Varicose vein     Past Surgical History:  Procedure Laterality Date  . ABDOMINAL HYSTERECTOMY  1975   partial  . APPENDECTOMY  1975   with hysterectomy  . HEMORROIDECTOMY     [phone]  . LAPAROSCOPIC SMALL BOWEL RESECTION N/A 11/29/2014   Procedure: LAPAROSCOPIC SMALL INTESTINE RESECTION;  Surgeon: Michael Boston, MD;  Location: [facility];  Service: General;  Laterality: N/A;  . VARICOSE VEIN SURGERY Left 1971    Social History   Socioeconomic History  . Marital status: Widowed    Spouse name: Not on file  . Number of children: Not on file  . Years of education: Not on file  . Highest education level: Not on file  Occupational History  . Occupation: Retired  Tobacco Use  . Smoking  status: Never Smoker  . Smokeless tobacco: Never Used  Vaping Use  . Vaping Use: Never used  Substance and Sexual Activity  . Alcohol use: No    Alcohol/week: 0.0 standard drinks  . Drug use: No  . Sexual activity: Not Currently  Other Topics Concern  . Not on file  Social History Narrative  . Not on file   Social Determinants of Health   Financial Resource Strain: Low Risk   . Difficulty of Paying Living Expenses: Not hard at all  Food Insecurity: No Food Insecurity  . Worried About Charity fundraiser in the Last Year: Never true  . Ran Out of Food in the Last Year: Never true  Transportation Needs: No Transportation Needs  . Lack of Transportation (Medical): No  . Lack of Transportation (Non-Medical): No  Physical Activity: Inactive  . Days of Exercise per Week: 0 days  . Minutes of Exercise per Session: 0 min  Stress: No Stress Concern Present  . Feeling of Stress : Not at all  Social Connections: Moderately Integrated  . Frequency of Communication with Friends and Family: More than three times a week  . Frequency of Social Gatherings with Friends and Family: More than three times a week  . Attends Religious Services: More than 4 times per year  . Active  Member of Clubs or Organizations: Yes  . Attends Archivist Meetings: More than 4 times per year  . Marital Status: Widowed    Family History  Problem Relation Age of Onset  . Bone cancer Mother   . Diabetes Father   . Stroke Father   . Breast cancer Sister   . Stomach cancer Neg Hx   . Colon cancer Neg Hx     Health Maintenance  Topic Date Due  . INFLUENZA VACCINE  02/17/2020  . TETANUS/TDAP  05/30/2028  . DEXA SCAN  Completed  . COVID-19 Vaccine  Completed  . PNA vac Low Risk Adult  Completed      ----------------------------------------------------------------------------------------------------------------------------------------------------------------------------------------------------------------- Physical Exam BP 120/74 (BP Location: Left Arm, Patient Position: Sitting, Cuff Size: Normal)   Pulse (!) 112   Ht 5\' 7"  (1.702 m)   Wt 122 lb (55.3 kg)   SpO2 96%   BMI 19.11 kg/m   Physical Exam Constitutional:      Comments: Thin, frail appearing female.   Eyes:     General: No scleral icterus. Cardiovascular:     Rate and Rhythm: Normal rate and regular rhythm.  Pulmonary:     Effort: Pulmonary effort is normal.     Breath sounds: Normal breath sounds.  Musculoskeletal:     Cervical back: Neck supple.  Neurological:     General: No focal deficit present.  Psychiatric:        Mood and Affect: Mood normal.        Behavior: Behavior normal.     ------------------------------------------------------------------------------------------------------------------------------------------------------------------------------------------------------------------- Assessment and Plan  UTI (urinary tract infection) She has not had much improvement with cephalexin.  Will change to macrobid.   Urine sent for culture.    Loose stools Continued diarrhea and loose stools.  Chronic issue.  Only mild improvement with immodium.  Continue probiotic.  Recheck C. Dff.  Will add lomotil to use sparingly for severe diarrhea.     Hyponatremia Having some transient alterations in mentation, likely related to UTI.  Will update sodium levels.    Right shoulder pain Continued pain, she is working with PT.   Protein-calorie malnutrition (HCC) Decreased appetite.   Adding mirtazapine back on to help with appetite as her nightmares didn't really improve with stopping this.     Meds ordered this encounter  Medications  . DISCONTD: mirtazapine (REMERON) 15 MG tablet    Sig:  Take 0.5 tablets (7.5 mg total) by mouth at bedtime. Take 1/2 tab x1 week then increase to full tab.    Dispense:  90 tablet    Refill:  1  . nitrofurantoin, macrocrystal-monohydrate, (MACROBID) 100 MG capsule    Sig: Take 1 capsule (100 mg total) by mouth 2 (two) times daily for 5 days.    Dispense:  10 capsule    Refill:  0  . diphenoxylate-atropine (LOMOTIL) 2.5-0.025 MG tablet    Sig: Take one tablet 1-2x per day as needed for diarrhea.    Dispense:  30 tablet    Refill:  0  . amLODipine (NORVASC) 5 MG tablet    Sig: Take 1 tablet (5 mg total) by mouth daily.    Dispense:  90 tablet    Refill:  1  . mirtazapine (REMERON) 15 MG tablet    Sig: Take 0.5 tablets (7.5 mg total) by mouth at bedtime.    Dispense:  90 tablet    Refill:  1    Return in about 1 week (around 06/09/2020) for UTI.  This visit occurred during the SARS-CoV-2 public health emergency.  Safety protocols were in place, including screening questions prior to the visit, additional usage of staff PPE, and extensive cleaning of exam room while observing appropriate contact time as indicated for disinfecting solutions.

## 2020-06-02 NOTE — Assessment & Plan Note (Signed)
Continued pain, she is working with PT.

## 2020-06-03 LAB — CBC WITH DIFFERENTIAL/PLATELET
Absolute Monocytes: 1180 cells/uL — ABNORMAL HIGH (ref 200–950)
Basophils Absolute: 20 cells/uL (ref 0–200)
Basophils Relative: 0.2 %
Eosinophils Absolute: 0 cells/uL — ABNORMAL LOW (ref 15–500)
Eosinophils Relative: 0 %
HCT: 30.7 % — ABNORMAL LOW (ref 35.0–45.0)
Hemoglobin: 10.5 g/dL — ABNORMAL LOW (ref 11.7–15.5)
Lymphs Abs: 1760 cells/uL (ref 850–3900)
MCH: 29.8 pg (ref 27.0–33.0)
MCHC: 34.2 g/dL (ref 32.0–36.0)
MCV: 87.2 fL (ref 80.0–100.0)
MPV: 9.8 fL (ref 7.5–12.5)
Monocytes Relative: 11.8 %
Neutro Abs: 7040 cells/uL (ref 1500–7800)
Neutrophils Relative %: 70.4 %
Platelets: 449 10*3/uL — ABNORMAL HIGH (ref 140–400)
RBC: 3.52 10*6/uL — ABNORMAL LOW (ref 3.80–5.10)
RDW: 14.4 % (ref 11.0–15.0)
Total Lymphocyte: 17.6 %
WBC: 10 10*3/uL (ref 3.8–10.8)

## 2020-06-03 LAB — COMPLETE METABOLIC PANEL WITH GFR
AG Ratio: 1 (calc) (ref 1.0–2.5)
ALT: 14 U/L (ref 6–29)
AST: 21 U/L (ref 10–35)
Albumin: 3.1 g/dL — ABNORMAL LOW (ref 3.6–5.1)
Alkaline phosphatase (APISO): 94 U/L (ref 37–153)
BUN/Creatinine Ratio: 14 (calc) (ref 6–22)
BUN: 16 mg/dL (ref 7–25)
CO2: 28 mmol/L (ref 20–32)
Calcium: 8.5 mg/dL — ABNORMAL LOW (ref 8.6–10.4)
Chloride: 89 mmol/L — ABNORMAL LOW (ref 98–110)
Creat: 1.14 mg/dL — ABNORMAL HIGH (ref 0.60–0.88)
GFR, Est African American: 52 mL/min/{1.73_m2} — ABNORMAL LOW (ref >=60)
GFR, Est Non African American: 45 mL/min/{1.73_m2} — ABNORMAL LOW (ref >=60)
Globulin: 3.1 g/dL (calc) (ref 1.9–3.7)
Glucose, Bld: 100 mg/dL — ABNORMAL HIGH (ref 65–99)
Potassium: 4.3 mmol/L (ref 3.5–5.3)
Sodium: 125 mmol/L — ABNORMAL LOW (ref 135–146)
Total Bilirubin: 0.8 mg/dL (ref 0.2–1.2)
Total Protein: 6.2 g/dL (ref 6.1–8.1)

## 2020-06-04 ENCOUNTER — Other Ambulatory Visit: Payer: Self-pay | Admitting: Family Medicine

## 2020-06-04 LAB — URINE CULTURE
MICRO NUMBER:: 11206814
SPECIMEN QUALITY:: ADEQUATE

## 2020-06-04 MED ORDER — CIPROFLOXACIN HCL 500 MG PO TABS: 500.0000 mg | ORAL_TABLET | Freq: Two times a day (BID) | ORAL | 0 refills | Status: AC

## 2020-06-06 ENCOUNTER — Emergency Department (HOSPITAL_COMMUNITY)
Admission: EM | Admit: 2020-06-06 | Discharge: 2020-06-06 | Disposition: A | Discharge status: Home / Self Care | Payer: Medicare PPO | Attending: Emergency Medicine | Admitting: Emergency Medicine

## 2020-06-06 ENCOUNTER — Emergency Department (HOSPITAL_COMMUNITY): Payer: Medicare PPO

## 2020-06-06 ENCOUNTER — Telehealth: Payer: Self-pay

## 2020-06-06 ENCOUNTER — Other Ambulatory Visit: Payer: Self-pay

## 2020-06-06 DIAGNOSIS — R42 Dizziness and giddiness: Secondary | ICD-10-CM | POA: Diagnosis not present

## 2020-06-06 DIAGNOSIS — S40011A Contusion of right shoulder, initial encounter: Secondary | ICD-10-CM | POA: Diagnosis not present

## 2020-06-06 DIAGNOSIS — W19XXXA Unspecified fall, initial encounter: Secondary | ICD-10-CM

## 2020-06-06 DIAGNOSIS — R52 Pain, unspecified: Secondary | ICD-10-CM | POA: Diagnosis not present

## 2020-06-06 DIAGNOSIS — W010XXA Fall on same level from slipping, tripping and stumbling without subsequent striking against object, initial encounter: Secondary | ICD-10-CM | POA: Diagnosis not present

## 2020-06-06 DIAGNOSIS — R531 Weakness: Secondary | ICD-10-CM | POA: Diagnosis not present

## 2020-06-06 DIAGNOSIS — I10 Essential (primary) hypertension: Secondary | ICD-10-CM | POA: Diagnosis not present

## 2020-06-06 DIAGNOSIS — S0990XA Unspecified injury of head, initial encounter: Secondary | ICD-10-CM | POA: Diagnosis not present

## 2020-06-06 DIAGNOSIS — S43004D Unspecified dislocation of right shoulder joint, subsequent encounter: Secondary | ICD-10-CM | POA: Diagnosis not present

## 2020-06-06 DIAGNOSIS — E039 Hypothyroidism, unspecified: Secondary | ICD-10-CM | POA: Insufficient documentation

## 2020-06-06 DIAGNOSIS — Y92002 Bathroom of unspecified non-institutional (private) residence single-family (private) house as the place of occurrence of the external cause: Secondary | ICD-10-CM | POA: Diagnosis not present

## 2020-06-06 DIAGNOSIS — F411 Generalized anxiety disorder: Secondary | ICD-10-CM | POA: Diagnosis not present

## 2020-06-06 DIAGNOSIS — R5381 Other malaise: Secondary | ICD-10-CM | POA: Diagnosis not present

## 2020-06-06 DIAGNOSIS — S42101A Fracture of unspecified part of scapula, right shoulder, initial encounter for closed fracture: Secondary | ICD-10-CM | POA: Diagnosis not present

## 2020-06-06 DIAGNOSIS — S2241XD Multiple fractures of ribs, right side, subsequent encounter for fracture with routine healing: Secondary | ICD-10-CM | POA: Diagnosis not present

## 2020-06-06 DIAGNOSIS — I509 Heart failure, unspecified: Secondary | ICD-10-CM | POA: Insufficient documentation

## 2020-06-06 DIAGNOSIS — Z79899 Other long term (current) drug therapy: Secondary | ICD-10-CM | POA: Insufficient documentation

## 2020-06-06 DIAGNOSIS — E119 Type 2 diabetes mellitus without complications: Secondary | ICD-10-CM | POA: Diagnosis not present

## 2020-06-06 DIAGNOSIS — I110 Hypertensive heart disease with heart failure: Secondary | ICD-10-CM | POA: Insufficient documentation

## 2020-06-06 DIAGNOSIS — N3090 Cystitis, unspecified without hematuria: Secondary | ICD-10-CM | POA: Diagnosis not present

## 2020-06-06 DIAGNOSIS — D649 Anemia, unspecified: Secondary | ICD-10-CM | POA: Diagnosis not present

## 2020-06-06 DIAGNOSIS — S2241XA Multiple fractures of ribs, right side, initial encounter for closed fracture: Secondary | ICD-10-CM | POA: Diagnosis not present

## 2020-06-06 DIAGNOSIS — M8580 Other specified disorders of bone density and structure, unspecified site: Secondary | ICD-10-CM | POA: Diagnosis not present

## 2020-06-06 DIAGNOSIS — M810 Age-related osteoporosis without current pathological fracture: Secondary | ICD-10-CM | POA: Diagnosis not present

## 2020-06-06 DIAGNOSIS — S4991XA Unspecified injury of right shoulder and upper arm, initial encounter: Secondary | ICD-10-CM | POA: Diagnosis not present

## 2020-06-06 DIAGNOSIS — R0902 Hypoxemia: Secondary | ICD-10-CM | POA: Diagnosis not present

## 2020-06-06 DIAGNOSIS — J9811 Atelectasis: Secondary | ICD-10-CM | POA: Diagnosis not present

## 2020-06-06 DIAGNOSIS — R0602 Shortness of breath: Secondary | ICD-10-CM | POA: Diagnosis not present

## 2020-06-06 LAB — CBC WITH DIFFERENTIAL/PLATELET
Abs Immature Granulocytes: 0.05 10*3/uL (ref 0.00–0.07)
Basophils Absolute: 0 10*3/uL (ref 0.0–0.1)
Basophils Relative: 0 %
Eosinophils Absolute: 0 10*3/uL (ref 0.0–0.5)
Eosinophils Relative: 0 %
HCT: 32.3 % — ABNORMAL LOW (ref 36.0–46.0)
Hemoglobin: 10.6 g/dL — ABNORMAL LOW (ref 12.0–15.0)
Immature Granulocytes: 1 %
Lymphocytes Relative: 21 %
Lymphs Abs: 1.7 10*3/uL (ref 0.7–4.0)
MCH: 29.7 pg (ref 26.0–34.0)
MCHC: 32.8 g/dL (ref 30.0–36.0)
MCV: 90.5 fL (ref 80.0–100.0)
Monocytes Absolute: 0.8 10*3/uL (ref 0.1–1.0)
Monocytes Relative: 11 %
Neutro Abs: 5.2 10*3/uL (ref 1.7–7.7)
Neutrophils Relative %: 67 %
Platelets: 393 10*3/uL (ref 150–400)
RBC: 3.57 MIL/uL — ABNORMAL LOW (ref 3.87–5.11)
RDW: 14.9 % (ref 11.5–15.5)
WBC: 7.8 10*3/uL (ref 4.0–10.5)
nRBC: 0 % (ref 0.0–0.2)

## 2020-06-06 LAB — COMPREHENSIVE METABOLIC PANEL
ALT: 18 U/L (ref 0–44)
AST: 27 U/L (ref 15–41)
Albumin: 3.2 g/dL — ABNORMAL LOW (ref 3.5–5.0)
Alkaline Phosphatase: 96 U/L (ref 38–126)
Anion gap: 14 (ref 5–15)
BUN: 12 mg/dL (ref 8–23)
CO2: 26 mmol/L (ref 22–32)
Calcium: 8.5 mg/dL — ABNORMAL LOW (ref 8.9–10.3)
Chloride: 92 mmol/L — ABNORMAL LOW (ref 98–111)
Creatinine, Ser: 0.99 mg/dL (ref 0.44–1.00)
GFR, Estimated: 57 mL/min — ABNORMAL LOW (ref >60)
Glucose, Bld: 90 mg/dL (ref 70–99)
Potassium: 3.9 mmol/L (ref 3.5–5.1)
Sodium: 132 mmol/L — ABNORMAL LOW (ref 135–145)
Total Bilirubin: 0.8 mg/dL (ref 0.3–1.2)
Total Protein: 7 g/dL (ref 6.5–8.1)

## 2020-06-06 LAB — LACTIC ACID, PLASMA
Lactic Acid, Venous: 1.9 mmol/L (ref 0.5–1.9)
Lactic Acid, Venous: 2.6 mmol/L (ref 0.5–1.9)
Lactic Acid, Venous: 2 mmol/L (ref 0.5–1.9)

## 2020-06-06 LAB — URINALYSIS, ROUTINE W REFLEX MICROSCOPIC
Bilirubin Urine: NEGATIVE
Glucose, UA: NEGATIVE mg/dL
Hgb urine dipstick: NEGATIVE
Ketones, ur: NEGATIVE mg/dL
Leukocytes,Ua: NEGATIVE
Nitrite: NEGATIVE
Protein, ur: NEGATIVE mg/dL
Specific Gravity, Urine: 1.006 (ref 1.005–1.030)
pH: 6 (ref 5.0–8.0)

## 2020-06-06 MED ORDER — LEVOFLOXACIN IN D5W 750 MG/150ML IV SOLN: 750.0000 mg | Freq: Once | INTRAVENOUS | Status: DC

## 2020-06-06 MED ORDER — LACTATED RINGERS IV BOLUS (SEPSIS)
1000.0000 mL | Freq: Once | INTRAVENOUS | Status: AC
  Administered 2020-06-06: 1000 mL via INTRAVENOUS

## 2020-06-06 MED ORDER — SODIUM CHLORIDE 0.9 % IV BOLUS
500.0000 mL | Freq: Once | INTRAVENOUS | Status: AC
  Administered 2020-06-06: 500 mL via INTRAVENOUS

## 2020-06-06 MED ORDER — SODIUM CHLORIDE 0.9 % IV SOLN
2.0000 g | Freq: Once | INTRAVENOUS | Status: AC
  Administered 2020-06-06: 2 g via INTRAVENOUS
  Filled 2020-06-06: qty 2

## 2020-06-06 MED ORDER — LEVOFLOXACIN 500 MG PO TABS: 500.0000 mg | ORAL_TABLET | Freq: Every day | ORAL | 0 refills | Status: DC

## 2020-06-06 MED ORDER — SODIUM CHLORIDE 0.9 % IV SOLN
1.0000 g | Freq: Once | INTRAVENOUS | Status: DC
  Filled 2020-06-06: qty 10

## 2020-06-06 MED ORDER — LACTATED RINGERS IV SOLN: INTRAVENOUS | Status: DC

## 2020-06-06 MED ORDER — LEVOFLOXACIN 500 MG PO TABS
500.0000 mg | ORAL_TABLET | Freq: Once | ORAL | Status: AC
  Administered 2020-06-06: 500 mg via ORAL
  Filled 2020-06-06: qty 1

## 2020-06-06 NOTE — ED Provider Notes (Addendum)
[facility] DEPT Provider Note   CSN: [id number] Arrival date & time: 06/06/20  1342     History Chief Complaint  Patient presents with  . Fall    Christy Wade is a 84 y.o. female.  HPI    84 year old female comes in a chief complaint of fall.  Past medical history of anemia, hypertension, osteoporosis. Patient reports recurrent UTI history resulting weakness.  Today she woke up and was feeling weak, but was heading to the bathroom when she suddenly collapsed.  There was no syncope.  Her body just gave out.  She fell onto her right side and is complaining of right shoulder pain.  No loss of consciousness.  No numbness, tingling, vision change, focal weakness.  Patient is not on blood thinners.  She is noted to be slightly tachypneic.  Patient denies any cough, fever, no chills but feels a little cold.  Patient currently on antibiotics for UTI.  PCP saw her recently and informed her that her UTI was not completely cleared.  No history of PE, DVT.  Patient denies any COVID-19 exposures.  She resides at home with home care during the day and family support at night.   Past Medical History:  Diagnosis Date  . Anemia    on iron now  . Cataract   . Hypertension   . Hypothyroidism   . Osteoporosis   . PONV (postoperative nausea and vomiting)   . Thyroid disease   . Varicose vein     Patient Active Problem List   Diagnosis Date Noted  . Protein-calorie malnutrition ([place]) 06/02/2020  . UTI (urinary tract infection) 05/27/2020  . GAD (generalized anxiety disorder) 05/14/2020  . Hypokalemia 05/14/2020  . Right shoulder pain 05/14/2020  . Loose stools 05/09/2020  . Dysphagia 05/09/2020  . Hemorrhoids 05/09/2020  . Fatigue 05/05/2020  . CHF (congestive heart failure) ([place]) 03/25/2020  . Respiratory failure ([place]) 03/25/2020  . Multiple fractures of ribs, right side, subsequent encounter for fracture with routine healing 03/06/2020  . Hyponatremia  03/05/2020  . Diarrhea 07/24/2018  . Vaginitis and vulvovaginitis 05/22/2016  . Well adult exam 10/01/2015  . Chronic chest wall pain 09/09/2014  . Mitral valve regurgitation 06/21/2012  . Essential hypertension 01/03/2008  . Osteopenia 03/28/2007  . Hypothyroidism 02/16/2007    Past Surgical History:  Procedure Laterality Date  . ABDOMINAL HYSTERECTOMY  1975   partial  . APPENDECTOMY  1975   with hysterectomy  . HEMORROIDECTOMY     [phone]  . LAPAROSCOPIC SMALL BOWEL RESECTION N/A 11/29/2014   Procedure: LAPAROSCOPIC SMALL INTESTINE RESECTION;  Surgeon: Michael Boston, MD;  Location: [facility];  Service: General;  Laterality: N/A;  . VARICOSE VEIN SURGERY Left 1971     OB History   No obstetric history on file.     Family History  Problem Relation Age of Onset  . Bone cancer Mother   . Diabetes Father   . Stroke Father   . Breast cancer Sister   . Stomach cancer Neg Hx   . Colon cancer Neg Hx     Social History   Tobacco Use  . Smoking status: Never Smoker  . Smokeless tobacco: Never Used  Vaping Use  . Vaping Use: Never used  Substance Use Topics  . Alcohol use: No    Alcohol/week: 0.0 standard drinks  . Drug use: No    Home Medications Prior to Admission medications   Medication Sig Start Date End Date Taking? Authorizing Provider  amLODipine (NORVASC) 5 MG tablet Take 1 tablet (5 mg total) by mouth daily. 06/02/20   Luetta Nutting, DO  ciprofloxacin (CIPRO) 500 MG tablet Take 1 tablet (500 mg total) by mouth 2 (two) times daily for 5 days. 06/04/20 06/09/20  Luetta Nutting, DO  cycloSPORINE (RESTASIS) 0.05 % ophthalmic emulsion Place 1 drop into both eyes 2 (two) times daily. 04/24/20   Ngetich, Dinah C, NP  diphenoxylate-atropine (LOMOTIL) 2.5-0.025 MG tablet Take one tablet 1-2x per day as needed for diarrhea. 06/02/20   Luetta Nutting, DO  furosemide (LASIX) 40 MG tablet Take 1 tablet (40 mg total) by mouth daily. 05/26/20   Luetta Nutting, DO    hydrALAZINE (APRESOLINE) 25 MG tablet Take 1 tablet (25 mg total) by mouth every 8 (eight) hours. 05/26/20   Luetta Nutting, DO  levothyroxine (SYNTHROID) 50 MCG tablet  05/11/20   [provider]  levothyroxine (SYNTHROID) 88 MCG tablet Take 1 tablet (88 mcg total) by mouth daily before breakfast. 05/14/20   Luetta Nutting, DO  lisinopril (ZESTRIL) 20 MG tablet  05/11/20   [provider]  Magnesium Hydroxide (MILK OF MAGNESIA PO) Take 30 mLs by mouth as needed.     [provider]  Menthol, Topical Analgesic, (BIOFREEZE) 4 % GEL Apply 3 oz topically 3 (three) times daily as needed. 04/24/20   Ngetich, Dinah C, NP  mirtazapine (REMERON) 15 MG tablet Take 0.5 tablets (7.5 mg total) by mouth at bedtime. 06/02/20   Luetta Nutting, DO  pantoprazole (PROTONIX) 40 MG tablet Take 1 tablet (40 mg total) by mouth daily. 05/12/20   Luetta Nutting, DO  polyethylene glycol (MIRALAX) 17 g packet Take 17 g by mouth as needed.     [provider]  potassium chloride 20 MEQ/15ML (10%) SOLN Take 30 mLs (40 mEq total) by mouth daily. 05/12/20 08/10/20  Luetta Nutting, DO  saccharomyces boulardii (FLORASTOR) 250 MG capsule Take 1 capsule (250 mg total) by mouth 2 (two) times daily. 05/12/20 06/11/20  Luetta Nutting, DO    Allergies    Codeine and Sulfamethoxazole  Review of Systems   Review of Systems  Constitutional: Positive for activity change.  Respiratory: Positive for shortness of breath.   Cardiovascular: Negative for chest pain.  Gastrointestinal: Negative for nausea and vomiting.  Hematological: Does not bruise/bleed easily.  All other systems reviewed and are negative.   Physical Exam Updated Vital Signs BP 111/73   Pulse (!) 103   Temp 97.9 F (36.6 C) (Oral)   Resp 13   Ht 5\' 7"  (1.702 m)   Wt 55.3 kg   SpO2 95%   BMI 19.09 kg/m   Physical Exam Vitals and nursing note reviewed.  Constitutional:      Appearance: She is well-developed.  HENT:      Head: Normocephalic and atraumatic.  Eyes:     Pupils: Pupils are equal, round, and reactive to light.  Cardiovascular:     Rate and Rhythm: Normal rate and regular rhythm.     Heart sounds: Normal heart sounds. No murmur heard.   Pulmonary:     Effort: Pulmonary effort is normal. No respiratory distress.  Abdominal:     General: There is no distension.     Palpations: Abdomen is soft.     Tenderness: There is no abdominal tenderness. There is no guarding or rebound.  Musculoskeletal:     Cervical back: Neck supple.     Comments: Right-sided shoulder tenderness. Patient has diffuse bruises over the lower  extremity.  Head to toe evaluation shows no hematoma, bleeding of the scalp, no facial abrasions, no spine step offs, crepitus of the chest or neck, no tenderness to palpation of the bilateral upper and lower extremities, no gross deformities, no chest tenderness, no pelvic pain.   Skin:    General: Skin is warm and dry.  Neurological:     Mental Status: She is alert and oriented to person, place, and time.     ED Results / Procedures / Treatments   Labs (all labs ordered are listed, but only abnormal results are displayed) Labs Reviewed  LACTIC ACID, PLASMA - Abnormal; Notable for the following components:      Result Value   Lactic Acid, Venous 2.6 (*)    All other components within normal limits  COMPREHENSIVE METABOLIC PANEL - Abnormal; Notable for the following components:   Sodium 132 (*)    Chloride 92 (*)    Calcium 8.5 (*)    Albumin 3.2 (*)    GFR, Estimated 57 (*)    All other components within normal limits  CBC WITH DIFFERENTIAL/PLATELET - Abnormal; Notable for the following components:   RBC 3.57 (*)    Hemoglobin 10.6 (*)    HCT 32.3 (*)    All other components within normal limits  CULTURE, BLOOD (ROUTINE X 2)  CULTURE, BLOOD (ROUTINE X 2)  URINE CULTURE  LACTIC ACID, PLASMA  URINALYSIS, ROUTINE W REFLEX MICROSCOPIC  LACTIC ACID, PLASMA  LACTIC  ACID, PLASMA    EKG EKG Interpretation  Date/Time:  Friday June 06 2020 15:03:15 EST Ventricular Rate:  95 PR Interval:    QRS Duration: 140 QT Interval:  419 QTC Calculation: 527 R Axis:   -26 Text Interpretation: Sinus rhythm Left bundle branch block No acute changes No significant change since last tracing Confirmed by Varney Biles ([id number]) on 06/06/2020 4:01:18 PM   Radiology DG Chest 2 View  Result Date: 06/06/2020 CLINICAL DATA:  Fall this morning. EXAM: CHEST - 2 VIEW COMPARISON:  05/05/2020 FINDINGS: Lungs are adequately inflated demonstrate continued bibasilar opacification compatible with small effusions with associated atelectasis as these findings may be slightly worse. Linear atelectasis over the anterior mid lungs on the lateral film. Cardiac silhouette is unremarkable. Evidence patient's known multiple acute to subacute right posterolateral rib fractures. Superior subluxation of the distal right clavicle at the [facility] joint unchanged. Findings suggest fracture involving the right shoulder which is not well evaluated on this exam. IMPRESSION: 1. Bibasilar opacification compatible with small effusions with associated atelectasis slightly worse. 2. Multiple acute to subacute right posterolateral rib fractures unchanged. 3. Suggestion of fracture involving the right shoulder not well evaluated on this exam. Electronically Signed   By: Marin Olp M.D.   On: 06/06/2020 15:30   DG Shoulder Right  Result Date: 06/06/2020 CLINICAL DATA:  Shoulder pain, fall EXAM: RIGHT SHOULDER - 2+ VIEW COMPARISON:  None. FINDINGS: There is a linear lucency seen at the medial aspect of the humeral head which could represent a nondisplaced fracture. Again noted are posterior right rib fractures as on the prior exam. A slightly incongruous AC joint is again noted. Overlying soft tissue swelling. IMPRESSION: Lucency at the medial aspect of the humeral head which may be a incomplete fracture versus  overlap of shadows. Electronically Signed   By: Prudencio Pair M.D.   On: 06/06/2020 15:34   CT Head Wo Contrast  Result Date: 06/06/2020 CLINICAL DATA:  Head trauma. EXAM: CT HEAD WITHOUT CONTRAST TECHNIQUE: Contiguous  axial images were obtained from the base of the skull through the vertex without intravenous contrast. COMPARISON:  None. FINDINGS: Brain: No evidence of acute infarction, hemorrhage, hydrocephalus, extra-axial collection or mass lesion/mass effect. Patchy white matter hypoattenuation, likely the sequela of chronic microvascular ischemic disease. Vascular: Calcific atherosclerosis. Skull: Normal. Negative for fracture or focal lesion. Sinuses/Orbits: No acute finding.  Visualized sinuses are clear. Other: No mastoid effusions. IMPRESSION: No evidence of acute intracranial abnormality. Electronically Signed   By: Margaretha Sheffield MD   On: 06/06/2020 16:23    Procedures Procedures (including critical care time)  Medications Ordered in ED Medications  sodium chloride 0.9 % bolus 500 mL (has no administration in time range)  levofloxacin (LEVAQUIN) tablet 500 mg (has no administration in time range)  lactated ringers bolus 1,000 mL (0 mLs Intravenous Stopped 06/06/20 1701)  ceFEPIme (MAXIPIME) 2 g in sodium chloride 0.9 % 100 mL IVPB (0 g Intravenous Stopped 06/06/20 1657)    ED Course  I have reviewed the triage vital signs and the nursing notes.  Pertinent labs & imaging results that were available during my care of the patient were reviewed by me and considered in my medical decision making (see chart for details).  Clinical Course as of Jun 06 1837  Fri Jun 06, 2020  [place] NUMBER IS [phone]   [AN]  [phone] Patient has ambulated without any difficulty. I have tried to reach patient's daughter on 2 separate occasions now, somebody picks at the phone but does not respond. Patient is stable for discharge.  Her lactate did go up slightly. She  will get some IV fluid in order Levaquin. Patient is allergic to Bactrim. The only oral option is fluoroquinolone. We are going with Levaquin given that she has some nonspecific findings over her lungs. I reassessed the patient. She denies any new chest pain, cough, shortness of breath. Lung exam revealscrackles in the base, likely atelectasis.   [AN]  1838 Voice mail left at Ms. Ware's #   [AN]    Clinical Course User Index [AN] Varney Biles, MD   MDM Rules/Calculators/A&P                          84 year old female comes in a chief complaint of fall and weakness.  She is being treated for UTI.  It appears that she is having urinary urgency, but no pain with urination, abdominal pain.  Patient is noted to be tachycardic and mildly tachypneic -therefore she has 2 SIRS criteria.  She is known to have UTI, cultures from office visit reviewed.  Urine cultures were helpful as it is noted that patient is on Keflex and her E. coli is resistant to it.  We will give her levofloxacin and hydrate her.  Screening labs for severe sepsis have been sent.  No chills but cold -will ensure blood cultures are negative.  Final Clinical Impression(s) / ED Diagnoses Final diagnoses:  Fall, initial encounter  Cystitis  Contusion of right shoulder, initial encounter    Rx / DC Orders ED Discharge Orders    None           Varney Biles, MD 06/06/20 Bosie Helper

## 2020-06-06 NOTE — ED Triage Notes (Signed)
Pt came from home via EMS. Pt fell down this Am around 0830. Hx of fall in August that caused right sided rib fx, sever arm pain since then. Reoccuring UTI w/o sx since August. Home health nurse present 24/7. Home health nurse got orthostatic VS prior to taking morning HTN med. Orthostatic VS were borderline hypotensive in low 100's, according to home health nurse prior to admin of HTN med. Upon standing with EMS after taking HTN meds, pt reported dizziness.   Low Na and K+ CBG: 138

## 2020-06-06 NOTE — Telephone Encounter (Signed)
Received voicemail from PT Asst Gerald Stabs from [facility] with report. [phone])  He arrived at [name] around 1130am.  - sent [name] to [facility] via ambulance  - daughter stated she fell this morning getting out of bed. Vitals were within range at this time. 96.6 temp noted. No orthostatics noted. No morning meds given. [name] appeared weak and dehydrated.  20 mins prior to ambulance arrival [name] daughter gave her morning meds. Upon ambulance arrival, [name] complained of dizziness and not feeling well. She was alert x3 with EMT.

## 2020-06-06 NOTE — Sepsis Progress Note (Signed)
Notified provider of need to order another repeat lactic acid, as the second result was higher than the first.  

## 2020-06-06 NOTE — ED Notes (Signed)
Pt taken to xray 

## 2020-06-06 NOTE — ED Notes (Addendum)
Bed alarm was placed and activated for pt's safety.

## 2020-06-06 NOTE — Discharge Instructions (Addendum)
We saw in the ER for weakness and fall. The work-up in the ER reveals that you have a bladder infection.  We have reviewed your urine cultures from 3 days ago.  You are on antibiotics that were not effective in treating the bladder infection.  We suspect that your weakness in the fall were a result of not getting appropriate antibiotics.  While in the ER, we have given you appropriate antibiotics intravenously.  Please take the antibiotics that are prescribed to finish the course of UTI treatment.  Your blood work is reassuring.  Your sodium is normal.  Your kidney function is fine as well.  We have ambulated you in the ER on 2 different occasions and you had no problems.  We would like you to follow-up with your primary care doctor soon as possible.  Return to the ER if your symptoms get worse.  We anticipate that with the right antibiotics your symptoms will actually get better.

## 2020-06-06 NOTE — ED Notes (Signed)
Pt ambulated to restroom and back to room with walker and minimal assistance from Probation officer. Bed alarm was reactivated.

## 2020-06-08 DIAGNOSIS — I10 Essential (primary) hypertension: Secondary | ICD-10-CM | POA: Diagnosis not present

## 2020-06-08 DIAGNOSIS — D649 Anemia, unspecified: Secondary | ICD-10-CM | POA: Diagnosis not present

## 2020-06-08 DIAGNOSIS — F411 Generalized anxiety disorder: Secondary | ICD-10-CM | POA: Diagnosis not present

## 2020-06-08 DIAGNOSIS — M8580 Other specified disorders of bone density and structure, unspecified site: Secondary | ICD-10-CM | POA: Diagnosis not present

## 2020-06-08 DIAGNOSIS — S2241XD Multiple fractures of ribs, right side, subsequent encounter for fracture with routine healing: Secondary | ICD-10-CM | POA: Diagnosis not present

## 2020-06-08 DIAGNOSIS — M810 Age-related osteoporosis without current pathological fracture: Secondary | ICD-10-CM | POA: Diagnosis not present

## 2020-06-08 DIAGNOSIS — E119 Type 2 diabetes mellitus without complications: Secondary | ICD-10-CM | POA: Diagnosis not present

## 2020-06-08 DIAGNOSIS — E039 Hypothyroidism, unspecified: Secondary | ICD-10-CM | POA: Diagnosis not present

## 2020-06-08 DIAGNOSIS — S43004D Unspecified dislocation of right shoulder joint, subsequent encounter: Secondary | ICD-10-CM | POA: Diagnosis not present

## 2020-06-08 NOTE — Telephone Encounter (Signed)
It looks like the ER sent in levaquin. Please be sure she isn't taking cipro as well with this

## 2020-06-09 DIAGNOSIS — D649 Anemia, unspecified: Secondary | ICD-10-CM | POA: Diagnosis not present

## 2020-06-09 DIAGNOSIS — I10 Essential (primary) hypertension: Secondary | ICD-10-CM | POA: Diagnosis not present

## 2020-06-09 DIAGNOSIS — M8580 Other specified disorders of bone density and structure, unspecified site: Secondary | ICD-10-CM | POA: Diagnosis not present

## 2020-06-09 DIAGNOSIS — E119 Type 2 diabetes mellitus without complications: Secondary | ICD-10-CM | POA: Diagnosis not present

## 2020-06-09 DIAGNOSIS — M810 Age-related osteoporosis without current pathological fracture: Secondary | ICD-10-CM | POA: Diagnosis not present

## 2020-06-09 DIAGNOSIS — S43004D Unspecified dislocation of right shoulder joint, subsequent encounter: Secondary | ICD-10-CM | POA: Diagnosis not present

## 2020-06-09 DIAGNOSIS — E039 Hypothyroidism, unspecified: Secondary | ICD-10-CM | POA: Diagnosis not present

## 2020-06-09 DIAGNOSIS — F411 Generalized anxiety disorder: Secondary | ICD-10-CM | POA: Diagnosis not present

## 2020-06-09 DIAGNOSIS — S2241XD Multiple fractures of ribs, right side, subsequent encounter for fracture with routine healing: Secondary | ICD-10-CM | POA: Diagnosis not present

## 2020-06-09 LAB — URINE CULTURE: Culture: 20000 — AB

## 2020-06-09 NOTE — Telephone Encounter (Signed)
Advised caretaker to d/c cipro and take hospital ordered Levaquin instead. She expressed understanding and stated she had taken the pills out.  ([name]'s caretaker often displays confusion when changing medications or she is given new information)

## 2020-06-10 ENCOUNTER — Emergency Department (HOSPITAL_COMMUNITY): Payer: Medicare PPO

## 2020-06-10 ENCOUNTER — Encounter (HOSPITAL_COMMUNITY): Payer: Self-pay | Admitting: *Deleted

## 2020-06-10 ENCOUNTER — Ambulatory Visit: Payer: Medicare PPO | Admitting: Family Medicine

## 2020-06-10 ENCOUNTER — Inpatient Hospital Stay (HOSPITAL_COMMUNITY)
Admission: EM | Admit: 2020-06-10 | Discharge: 2020-06-19 | DRG: 871 | Disposition: A | Discharge status: Skilled Nursing Facility | Payer: Medicare PPO | Attending: Internal Medicine | Admitting: Internal Medicine

## 2020-06-10 ENCOUNTER — Inpatient Hospital Stay (HOSPITAL_COMMUNITY): Payer: Medicare PPO

## 2020-06-10 ENCOUNTER — Other Ambulatory Visit: Payer: Self-pay

## 2020-06-10 DIAGNOSIS — E039 Hypothyroidism, unspecified: Secondary | ICD-10-CM | POA: Diagnosis not present

## 2020-06-10 DIAGNOSIS — A419 Sepsis, unspecified organism: Secondary | ICD-10-CM | POA: Diagnosis not present

## 2020-06-10 DIAGNOSIS — I420 Dilated cardiomyopathy: Secondary | ICD-10-CM | POA: Diagnosis not present

## 2020-06-10 DIAGNOSIS — R0902 Hypoxemia: Secondary | ICD-10-CM | POA: Diagnosis present

## 2020-06-10 DIAGNOSIS — J9601 Acute respiratory failure with hypoxia: Secondary | ICD-10-CM | POA: Diagnosis not present

## 2020-06-10 DIAGNOSIS — Z87440 Personal history of urinary (tract) infections: Secondary | ICD-10-CM

## 2020-06-10 DIAGNOSIS — Z833 Family history of diabetes mellitus: Secondary | ICD-10-CM

## 2020-06-10 DIAGNOSIS — R443 Hallucinations, unspecified: Secondary | ICD-10-CM | POA: Diagnosis present

## 2020-06-10 DIAGNOSIS — G9340 Encephalopathy, unspecified: Secondary | ICD-10-CM | POA: Diagnosis not present

## 2020-06-10 DIAGNOSIS — R410 Disorientation, unspecified: Secondary | ICD-10-CM | POA: Diagnosis not present

## 2020-06-10 DIAGNOSIS — Z7401 Bed confinement status: Secondary | ICD-10-CM | POA: Diagnosis not present

## 2020-06-10 DIAGNOSIS — D649 Anemia, unspecified: Secondary | ICD-10-CM | POA: Diagnosis present

## 2020-06-10 DIAGNOSIS — I10 Essential (primary) hypertension: Secondary | ICD-10-CM | POA: Diagnosis not present

## 2020-06-10 DIAGNOSIS — G319 Degenerative disease of nervous system, unspecified: Secondary | ICD-10-CM | POA: Diagnosis not present

## 2020-06-10 DIAGNOSIS — I351 Nonrheumatic aortic (valve) insufficiency: Secondary | ICD-10-CM | POA: Diagnosis not present

## 2020-06-10 DIAGNOSIS — R202 Paresthesia of skin: Secondary | ICD-10-CM | POA: Diagnosis not present

## 2020-06-10 DIAGNOSIS — S2241XA Multiple fractures of ribs, right side, initial encounter for closed fracture: Secondary | ICD-10-CM | POA: Diagnosis not present

## 2020-06-10 DIAGNOSIS — I4719 Other supraventricular tachycardia: Secondary | ICD-10-CM

## 2020-06-10 DIAGNOSIS — G9341 Metabolic encephalopathy: Secondary | ICD-10-CM | POA: Diagnosis not present

## 2020-06-10 DIAGNOSIS — R0602 Shortness of breath: Secondary | ICD-10-CM | POA: Diagnosis not present

## 2020-06-10 DIAGNOSIS — R680 Hypothermia, not associated with low environmental temperature: Secondary | ICD-10-CM | POA: Diagnosis present

## 2020-06-10 DIAGNOSIS — Z803 Family history of malignant neoplasm of breast: Secondary | ICD-10-CM

## 2020-06-10 DIAGNOSIS — Z79890 Hormone replacement therapy (postmenopausal): Secondary | ICD-10-CM | POA: Diagnosis not present

## 2020-06-10 DIAGNOSIS — Z23 Encounter for immunization: Secondary | ICD-10-CM

## 2020-06-10 DIAGNOSIS — S43004D Unspecified dislocation of right shoulder joint, subsequent encounter: Secondary | ICD-10-CM | POA: Diagnosis not present

## 2020-06-10 DIAGNOSIS — Z79899 Other long term (current) drug therapy: Secondary | ICD-10-CM | POA: Diagnosis not present

## 2020-06-10 DIAGNOSIS — J9811 Atelectasis: Secondary | ICD-10-CM | POA: Diagnosis not present

## 2020-06-10 DIAGNOSIS — N12 Tubulo-interstitial nephritis, not specified as acute or chronic: Secondary | ICD-10-CM | POA: Diagnosis not present

## 2020-06-10 DIAGNOSIS — Z823 Family history of stroke: Secondary | ICD-10-CM

## 2020-06-10 DIAGNOSIS — Z882 Allergy status to sulfonamides status: Secondary | ICD-10-CM | POA: Diagnosis not present

## 2020-06-10 DIAGNOSIS — R531 Weakness: Secondary | ICD-10-CM | POA: Diagnosis not present

## 2020-06-10 DIAGNOSIS — R52 Pain, unspecified: Secondary | ICD-10-CM | POA: Diagnosis not present

## 2020-06-10 DIAGNOSIS — I517 Cardiomegaly: Secondary | ICD-10-CM | POA: Diagnosis not present

## 2020-06-10 DIAGNOSIS — R296 Repeated falls: Secondary | ICD-10-CM | POA: Diagnosis present

## 2020-06-10 DIAGNOSIS — I5041 Acute combined systolic (congestive) and diastolic (congestive) heart failure: Secondary | ICD-10-CM

## 2020-06-10 DIAGNOSIS — I110 Hypertensive heart disease with heart failure: Secondary | ICD-10-CM | POA: Diagnosis present

## 2020-06-10 DIAGNOSIS — I340 Nonrheumatic mitral (valve) insufficiency: Secondary | ICD-10-CM | POA: Diagnosis not present

## 2020-06-10 DIAGNOSIS — R442 Other hallucinations: Secondary | ICD-10-CM | POA: Diagnosis not present

## 2020-06-10 DIAGNOSIS — Z90710 Acquired absence of both cervix and uterus: Secondary | ICD-10-CM

## 2020-06-10 DIAGNOSIS — R2681 Unsteadiness on feet: Secondary | ICD-10-CM | POA: Diagnosis not present

## 2020-06-10 DIAGNOSIS — Z66 Do not resuscitate: Secondary | ICD-10-CM | POA: Diagnosis present

## 2020-06-10 DIAGNOSIS — N281 Cyst of kidney, acquired: Secondary | ICD-10-CM | POA: Diagnosis not present

## 2020-06-10 DIAGNOSIS — E871 Hypo-osmolality and hyponatremia: Secondary | ICD-10-CM | POA: Diagnosis not present

## 2020-06-10 DIAGNOSIS — M6281 Muscle weakness (generalized): Secondary | ICD-10-CM | POA: Diagnosis not present

## 2020-06-10 DIAGNOSIS — I471 Supraventricular tachycardia: Secondary | ICD-10-CM

## 2020-06-10 DIAGNOSIS — Z20822 Contact with and (suspected) exposure to covid-19: Secondary | ICD-10-CM | POA: Diagnosis not present

## 2020-06-10 DIAGNOSIS — N3289 Other specified disorders of bladder: Secondary | ICD-10-CM | POA: Diagnosis not present

## 2020-06-10 DIAGNOSIS — N390 Urinary tract infection, site not specified: Secondary | ICD-10-CM

## 2020-06-10 DIAGNOSIS — G9389 Other specified disorders of brain: Secondary | ICD-10-CM | POA: Diagnosis not present

## 2020-06-10 DIAGNOSIS — J90 Pleural effusion, not elsewhere classified: Secondary | ICD-10-CM | POA: Diagnosis not present

## 2020-06-10 DIAGNOSIS — M2550 Pain in unspecified joint: Secondary | ICD-10-CM | POA: Diagnosis not present

## 2020-06-10 DIAGNOSIS — I5023 Acute on chronic systolic (congestive) heart failure: Secondary | ICD-10-CM | POA: Diagnosis present

## 2020-06-10 DIAGNOSIS — K6389 Other specified diseases of intestine: Secondary | ICD-10-CM | POA: Diagnosis not present

## 2020-06-10 DIAGNOSIS — M8580 Other specified disorders of bone density and structure, unspecified site: Secondary | ICD-10-CM | POA: Diagnosis not present

## 2020-06-10 DIAGNOSIS — Z885 Allergy status to narcotic agent status: Secondary | ICD-10-CM | POA: Diagnosis not present

## 2020-06-10 DIAGNOSIS — S2241XD Multiple fractures of ribs, right side, subsequent encounter for fracture with routine healing: Secondary | ICD-10-CM | POA: Diagnosis not present

## 2020-06-10 DIAGNOSIS — R9082 White matter disease, unspecified: Secondary | ICD-10-CM | POA: Diagnosis not present

## 2020-06-10 DIAGNOSIS — M810 Age-related osteoporosis without current pathological fracture: Secondary | ICD-10-CM | POA: Diagnosis present

## 2020-06-10 DIAGNOSIS — I447 Left bundle-branch block, unspecified: Secondary | ICD-10-CM | POA: Diagnosis present

## 2020-06-10 DIAGNOSIS — E876 Hypokalemia: Secondary | ICD-10-CM | POA: Diagnosis present

## 2020-06-10 DIAGNOSIS — Z9181 History of falling: Secondary | ICD-10-CM

## 2020-06-10 DIAGNOSIS — S2231XA Fracture of one rib, right side, initial encounter for closed fracture: Secondary | ICD-10-CM | POA: Diagnosis not present

## 2020-06-10 DIAGNOSIS — E119 Type 2 diabetes mellitus without complications: Secondary | ICD-10-CM | POA: Diagnosis not present

## 2020-06-10 DIAGNOSIS — I5043 Acute on chronic combined systolic (congestive) and diastolic (congestive) heart failure: Secondary | ICD-10-CM | POA: Diagnosis not present

## 2020-06-10 DIAGNOSIS — R9431 Abnormal electrocardiogram [ECG] [EKG]: Secondary | ICD-10-CM | POA: Diagnosis not present

## 2020-06-10 DIAGNOSIS — I361 Nonrheumatic tricuspid (valve) insufficiency: Secondary | ICD-10-CM | POA: Diagnosis not present

## 2020-06-10 DIAGNOSIS — I5021 Acute systolic (congestive) heart failure: Secondary | ICD-10-CM | POA: Diagnosis not present

## 2020-06-10 DIAGNOSIS — I429 Cardiomyopathy, unspecified: Secondary | ICD-10-CM | POA: Diagnosis present

## 2020-06-10 DIAGNOSIS — M25519 Pain in unspecified shoulder: Secondary | ICD-10-CM | POA: Diagnosis not present

## 2020-06-10 DIAGNOSIS — R4182 Altered mental status, unspecified: Secondary | ICD-10-CM | POA: Diagnosis not present

## 2020-06-10 DIAGNOSIS — E46 Unspecified protein-calorie malnutrition: Secondary | ICD-10-CM | POA: Diagnosis not present

## 2020-06-10 DIAGNOSIS — T368X5A Adverse effect of other systemic antibiotics, initial encounter: Secondary | ICD-10-CM | POA: Diagnosis present

## 2020-06-10 DIAGNOSIS — F411 Generalized anxiety disorder: Secondary | ICD-10-CM | POA: Diagnosis not present

## 2020-06-10 DIAGNOSIS — I5020 Unspecified systolic (congestive) heart failure: Secondary | ICD-10-CM | POA: Diagnosis not present

## 2020-06-10 LAB — CBC WITH DIFFERENTIAL/PLATELET
Abs Immature Granulocytes: 0.06 10*3/uL (ref 0.00–0.07)
Basophils Absolute: 0 10*3/uL (ref 0.0–0.1)
Basophils Relative: 0 %
Eosinophils Absolute: 0 10*3/uL (ref 0.0–0.5)
Eosinophils Relative: 0 %
HCT: 28.9 % — ABNORMAL LOW (ref 36.0–46.0)
Hemoglobin: 9.3 g/dL — ABNORMAL LOW (ref 12.0–15.0)
Immature Granulocytes: 1 %
Lymphocytes Relative: 14 %
Lymphs Abs: 0.8 10*3/uL (ref 0.7–4.0)
MCH: 29.3 pg (ref 26.0–34.0)
MCHC: 32.2 g/dL (ref 30.0–36.0)
MCV: 91.2 fL (ref 80.0–100.0)
Monocytes Absolute: 0.5 10*3/uL (ref 0.1–1.0)
Monocytes Relative: 9 %
Neutro Abs: 4.1 10*3/uL (ref 1.7–7.7)
Neutrophils Relative %: 76 %
Platelets: 325 10*3/uL (ref 150–400)
RBC: 3.17 MIL/uL — ABNORMAL LOW (ref 3.87–5.11)
RDW: 15.1 % (ref 11.5–15.5)
WBC: 5.5 10*3/uL (ref 4.0–10.5)
nRBC: 0 % (ref 0.0–0.2)

## 2020-06-10 LAB — COMPREHENSIVE METABOLIC PANEL
ALT: 18 U/L (ref 0–44)
AST: 26 U/L (ref 15–41)
Albumin: 2.9 g/dL — ABNORMAL LOW (ref 3.5–5.0)
Alkaline Phosphatase: 81 U/L (ref 38–126)
Anion gap: 9 (ref 5–15)
BUN: 14 mg/dL (ref 8–23)
CO2: 30 mmol/L (ref 22–32)
Calcium: 8.5 mg/dL — ABNORMAL LOW (ref 8.9–10.3)
Chloride: 94 mmol/L — ABNORMAL LOW (ref 98–111)
Creatinine, Ser: 0.86 mg/dL (ref 0.44–1.00)
GFR, Estimated: >60 mL/min (ref >60)
Glucose, Bld: 107 mg/dL — ABNORMAL HIGH (ref 70–99)
Potassium: 3 mmol/L — ABNORMAL LOW (ref 3.5–5.1)
Sodium: 133 mmol/L — ABNORMAL LOW (ref 135–145)
Total Bilirubin: 0.8 mg/dL (ref 0.3–1.2)
Total Protein: 6.2 g/dL — ABNORMAL LOW (ref 6.5–8.1)

## 2020-06-10 LAB — URINALYSIS, ROUTINE W REFLEX MICROSCOPIC
Bilirubin Urine: NEGATIVE
Glucose, UA: NEGATIVE mg/dL
Hgb urine dipstick: NEGATIVE
Ketones, ur: NEGATIVE mg/dL
Leukocytes,Ua: NEGATIVE
Nitrite: NEGATIVE
Protein, ur: NEGATIVE mg/dL
Specific Gravity, Urine: 1.014 (ref 1.005–1.030)
pH: 5 (ref 5.0–8.0)

## 2020-06-10 LAB — MAGNESIUM: Magnesium: 1.8 mg/dL (ref 1.7–2.4)

## 2020-06-10 LAB — PROTIME-INR
INR: 1.2 (ref 0.8–1.2)
Prothrombin Time: 15.1 seconds (ref 11.4–15.2)

## 2020-06-10 LAB — APTT: aPTT: 37 seconds — ABNORMAL HIGH (ref 24–36)

## 2020-06-10 LAB — RESP PANEL BY RT-PCR (FLU A&B, COVID) ARPGX2
Influenza A by PCR: NEGATIVE
Influenza B by PCR: NEGATIVE
SARS Coronavirus 2 by RT PCR: NEGATIVE

## 2020-06-10 LAB — LACTIC ACID, PLASMA: Lactic Acid, Venous: 1.4 mmol/L (ref 0.5–1.9)

## 2020-06-10 MED ORDER — ACETAMINOPHEN 650 MG RE SUPP: 650.0000 mg | Freq: Four times a day (QID) | RECTAL | Status: DC | PRN

## 2020-06-10 MED ORDER — ACETAMINOPHEN 325 MG PO TABS
650.0000 mg | ORAL_TABLET | Freq: Four times a day (QID) | ORAL | Status: DC | PRN
  Administered 2020-06-12 – 2020-06-16 (×4): 650 mg via ORAL
  Filled 2020-06-10 (×4): qty 2

## 2020-06-10 MED ORDER — PANTOPRAZOLE SODIUM 40 MG PO TBEC
40.0000 mg | DELAYED_RELEASE_TABLET | Freq: Every day | ORAL | Status: DC
  Administered 2020-06-10 – 2020-06-19 (×10): 40 mg via ORAL
  Filled 2020-06-10 (×10): qty 1

## 2020-06-10 MED ORDER — SODIUM CHLORIDE 0.9 % IV SOLN: INTRAVENOUS | Status: DC

## 2020-06-10 MED ORDER — LACTATED RINGERS IV BOLUS (SEPSIS)
1000.0000 mL | Freq: Once | INTRAVENOUS | Status: AC
  Administered 2020-06-10: 1000 mL via INTRAVENOUS

## 2020-06-10 MED ORDER — HYDRALAZINE HCL 20 MG/ML IJ SOLN: 5.0000 mg | Freq: Four times a day (QID) | INTRAMUSCULAR | Status: DC | PRN

## 2020-06-10 MED ORDER — POTASSIUM CHLORIDE 20 MEQ/15ML (10%) PO SOLN
40.0000 meq | Freq: Every day | ORAL | Status: DC
  Administered 2020-06-11 – 2020-06-19 (×9): 40 meq via ORAL
  Filled 2020-06-10 (×9): qty 30

## 2020-06-10 MED ORDER — LIP MEDEX EX OINT
TOPICAL_OINTMENT | Freq: Once | CUTANEOUS | Status: AC
  Filled 2020-06-10: qty 7

## 2020-06-10 MED ORDER — ONDANSETRON HCL 4 MG PO TABS
4.0000 mg | ORAL_TABLET | Freq: Four times a day (QID) | ORAL | Status: DC | PRN
  Administered 2020-06-18 – 2020-06-19 (×2): 4 mg via ORAL
  Filled 2020-06-10 (×2): qty 1

## 2020-06-10 MED ORDER — LACTATED RINGERS IV BOLUS (SEPSIS)
500.0000 mL | Freq: Once | INTRAVENOUS | Status: AC
  Administered 2020-06-10: 500 mL via INTRAVENOUS

## 2020-06-10 MED ORDER — POTASSIUM CHLORIDE 10 MEQ/100ML IV SOLN
10.0000 meq | INTRAVENOUS | Status: AC
  Administered 2020-06-10 (×3): 10 meq via INTRAVENOUS
  Filled 2020-06-10 (×3): qty 100

## 2020-06-10 MED ORDER — SACCHAROMYCES BOULARDII 250 MG PO CAPS
250.0000 mg | ORAL_CAPSULE | Freq: Two times a day (BID) | ORAL | Status: DC
  Administered 2020-06-10 – 2020-06-19 (×18): 250 mg via ORAL
  Filled 2020-06-10 (×18): qty 1

## 2020-06-10 MED ORDER — LACTATED RINGERS IV SOLN: INTRAVENOUS | Status: DC

## 2020-06-10 MED ORDER — SODIUM CHLORIDE 0.9 % IV SOLN
2.0000 g | Freq: Two times a day (BID) | INTRAVENOUS | Status: DC
  Administered 2020-06-10 – 2020-06-14 (×8): 2 g via INTRAVENOUS
  Filled 2020-06-10 (×9): qty 2

## 2020-06-10 MED ORDER — ENOXAPARIN SODIUM 40 MG/0.4ML ~~LOC~~ SOLN
40.0000 mg | SUBCUTANEOUS | Status: DC
  Administered 2020-06-10 – 2020-06-18 (×9): 40 mg via SUBCUTANEOUS
  Filled 2020-06-10 (×9): qty 0.4

## 2020-06-10 MED ORDER — POTASSIUM CHLORIDE 10 MEQ/100ML IV SOLN
10.0000 meq | INTRAVENOUS | Status: AC
  Administered 2020-06-10 (×2): 10 meq via INTRAVENOUS
  Filled 2020-06-10 (×2): qty 100

## 2020-06-10 MED ORDER — CYCLOSPORINE 0.05 % OP EMUL
1.0000 [drp] | Freq: Two times a day (BID) | OPHTHALMIC | Status: DC
  Administered 2020-06-10 – 2020-06-19 (×15): 1 [drp] via OPHTHALMIC
  Filled 2020-06-10 (×20): qty 1

## 2020-06-10 MED ORDER — ONDANSETRON HCL 4 MG/2ML IJ SOLN
4.0000 mg | Freq: Four times a day (QID) | INTRAMUSCULAR | Status: DC | PRN
  Administered 2020-06-15: 4 mg via INTRAVENOUS
  Filled 2020-06-10: qty 2

## 2020-06-10 MED ORDER — LEVOTHYROXINE SODIUM 88 MCG PO TABS
88.0000 ug | ORAL_TABLET | Freq: Every day | ORAL | Status: DC
  Administered 2020-06-11 – 2020-06-19 (×10): 88 ug via ORAL
  Filled 2020-06-10 (×10): qty 1

## 2020-06-10 MED ORDER — LACTATED RINGERS IV BOLUS (SEPSIS)
250.0000 mL | Freq: Once | INTRAVENOUS | Status: AC
  Administered 2020-06-10: 250 mL via INTRAVENOUS

## 2020-06-10 NOTE — Progress Notes (Signed)
Pharmacy Antibiotic Note  Christy Wade is a 84 y.o. female admitted on 06/10/2020 with sepsis and UTI.  Pharmacy has been consulted for cefepime dosing. Patient presents after multiple abx's for previous UTIs, with most recent being prescribed Levaquin for enterobacter UTI and now presents to ED with hallucinations causing increased falls  Plan: Cefepime 2g IV q12 per current renal function     Temp (24hrs), Avg:93.9 F (34.4 C), Min:93.9 F (34.4 C), Max:93.9 F (34.4 C)  Recent Labs  Lab 06/06/20 1448 06/06/20 1450 06/06/20 1705 06/06/20 1903  WBC  --  7.8  --   --   CREATININE  --  0.99  --   --   LATICACIDVEN 1.9  --  2.6* 2.0*    Estimated Creatinine Clearance: 38.9 mL/min (by C-G formula based on SCr of 0.99 mg/dL).    Allergies  Allergen Reactions  . Codeine Nausea Only    REACTION: nausea  . Sulfamethoxazole Rash    REACTION: rash     Thank you for allowing pharmacy to be a part of this patient's care.  Kara Mead 06/10/2020 12:58 PM

## 2020-06-10 NOTE — ED Notes (Signed)
Fall bundle: Fall wrist band Bed alarm on Yellow socks Fall risk sign on door

## 2020-06-10 NOTE — ED Provider Notes (Signed)
Medical screening examination/treatment/procedure(s) were conducted as a shared visit with non-physician practitioner(s) and myself.  I personally evaluated the patient during the encounter.  EKG Interpretation  Date/Time:  Tuesday June 10 2020 12:32:56 EST Ventricular Rate:  71 PR Interval:    QRS Duration: 157 QT Interval:  518 QTC Calculation: 563 R Axis:   7 Text Interpretation: Sinus rhythm Paired ventricular premature complexes Left bundle branch block Confirmed by Lacretia Leigh ([id number]) on 06/10/2020 3:29:46 PM  84 year old female presents nursing home due to hallucinations and possible UTI.  Found to be hypothermic here.  Sepsis protocol started and patient will require admission   Lacretia Leigh, MD 06/10/20 1530

## 2020-06-10 NOTE — Progress Notes (Addendum)
ED Antimicrobial Stewardship Positive Culture Follow Up   Christy Wade is an 84 y.o. female who presented to [facility] on 06/06/2020 with a chief complaint of  Chief Complaint  Patient presents with  . Fall    Recent Results (from the past 720 hour(s))  Urine Culture     Status: Abnormal   Collection Time: 06/02/20  2:55 PM   Specimen: Urine  Result Value Ref Range Status   MICRO NUMBER: [id number]  Final   SPECIMEN QUALITY: Adequate  Final   Sample Source NOT GIVEN  Final   STATUS: FINAL  Final   ISOLATE 1: Enterobacter aerogenes (A)  Final    Comment: Greater than 100,000 CFU/mL of Klebsiella aerogenes (Enterobacter)      Susceptibility   Enterobacter aerogenes - URINE CULTURE, REFLEX    AMOX/CLAVULANIC >=32 Resistant     CEFAZOLIN* >=64 Resistant      * For uncomplicated UTI caused by E. coli,K. pneumoniae or P. mirabilis: Cefazolin issusceptible if MIC <32 mcg/mL and predictssusceptible to the oral agents cefaclor, cefdinir,cefpodoxime, cefprozil, cefuroxime, cephalexinand loracarbef.    CEFEPIME <=1 Sensitive     CEFTRIAXONE >=64 Resistant     CIPROFLOXACIN <=0.25 Sensitive     LEVOFLOXACIN <=0.12 Sensitive     ERTAPENEM <=0.5 Sensitive     GENTAMICIN <=1 Sensitive     IMIPENEM 0.5 Sensitive     NITROFURANTOIN 64 Intermediate     PIP/TAZO >=128 Resistant     TOBRAMYCIN <=1 Sensitive     TRIMETH/SULFA* <=20 Sensitive      * For uncomplicated UTI caused by E. coli,K. pneumoniae or P. mirabilis: Cefazolin issusceptible if MIC <32 mcg/mL and predictssusceptible to the oral agents cefaclor, cefdinir,cefpodoxime, cefprozil, cefuroxime, cephalexinand loracarbef.Legend:S = Susceptible  I = IntermediateR = Resistant  NS = Not susceptible* = Not tested  NR = Not reported**NN = See antimicrobic comments  Blood Culture (routine x 2)     Status: None (Preliminary result)   Collection Time: 06/06/20  2:52 PM   Specimen: BLOOD  Result Value Ref Range Status   Specimen Description    Final    BLOOD RIGHT ANTECUBITAL Performed at [facility] [address] [place], [place] [address]    Special Requests   Final    BOTTLES DRAWN AEROBIC AND ANAEROBIC Blood Culture adequate volume Performed at [facility] [address] [place], [place] [address]    Culture   Final    NO GROWTH 4 DAYS Performed at [facility], [place] [address] [place], [place] [address]    Report Status PENDING  Incomplete  Blood Culture (routine x 2)     Status: None (Preliminary result)   Collection Time: 06/06/20  2:52 PM   Specimen: BLOOD  Result Value Ref Range Status   Specimen Description   Final    BLOOD LEFT ANTECUBITAL Performed at [facility] [address] [place], [place] [address]    Special Requests   Final    BOTTLES DRAWN AEROBIC AND ANAEROBIC Blood Culture adequate volume Performed at [facility] [address] [place], [place] [address]    Culture   Final    NO GROWTH 4 DAYS Performed at [facility], [place] [address] [place],  [address]    Report Status PENDING  Incomplete  Urine culture     Status: Abnormal   Collection Time: 06/06/20  4:47 PM   Specimen: In/Out Cath Urine  Result Value Ref Range Status   Specimen Description   Final    IN/OUT CATH URINE Performed at [facility] [address] [place], [place] [address]    Special Requests   Final    NONE Performed at [facility], [place] [address], [place], [place] [address]    Culture 20,000 COLONIES/mL STAPHYLOCOCCUS HOMINIS (A)  Final   Report Status 06/09/2020 FINAL  Final   Organism ID, Bacteria STAPHYLOCOCCUS HOMINIS (A)  Final      Susceptibility   Staphylococcus hominis - MIC*    CIPROFLOXACIN >=8 RESISTANT Resistant     GENTAMICIN <=0.5 SENSITIVE Sensitive     NITROFURANTOIN <=16 SENSITIVE Sensitive     OXACILLIN >=4 RESISTANT Resistant     TETRACYCLINE >=16  RESISTANT Resistant     VANCOMYCIN 2 SENSITIVE Sensitive     TRIMETH/SULFA 40 SENSITIVE Sensitive     CLINDAMYCIN >=8 RESISTANT Resistant     RIFAMPIN <=0.5 SENSITIVE Sensitive     Inducible Clindamycin NEGATIVE Sensitive     * 20,000 COLONIES/mL STAPHYLOCOCCUS HOMINIS    Plan to complete 5-day course of levofloxacin on 11/19 for enterobacter UTI. Staph hominis likely a contaminant. No further treatment necessary.  ED Provider: Lacretia Leigh, MD   Esmeralda Links, PharmD Candidate 06/10/2020, 12:06 PM

## 2020-06-10 NOTE — ED Provider Notes (Signed)
[facility] DEPT Provider Note   CSN: [id number] Arrival date & time: 06/10/20  1126     History Chief Complaint  Patient presents with  . Altered Mental Status  . Recurrent UTI    Christy Wade is a 84 y.o. female past medical history of recurrent UTI, hypertension, hypothyroidism, presenting to the emergency department from home with altered mental status.  Patient's daughter reports since Monday she has been having hallucinations of little children, she has not been acting herself, more drowsy than usual.  She is currently being treated with Levaquin for recurrent UTI, initially on Keflex.  She has not noted any fevers.  She has been having some decreased p.o. intake.  States her bowel movements are soft.  No vomiting, possible slight cough.  She states her mother is normally oriented at baseline and can carry out a full conversation.  Level 5 caveat secondary to altered mental status.  Patient unable to provide any history or review of systems.  The history is provided by a relative. The history is limited by the condition of the patient.       Past Medical History:  Diagnosis Date  . Anemia    on iron now  . Cataract   . Hypertension   . Hypothyroidism   . Osteoporosis   . PONV (postoperative nausea and vomiting)   . Thyroid disease   . Varicose vein     Patient Active Problem List   Diagnosis Date Noted  . Hallucinations 06/10/2020  . Sepsis ([place]) 06/10/2020  . Hypoxia 06/10/2020  . Protein-calorie malnutrition ([place]) 06/02/2020  . Recurrent UTI 05/27/2020  . GAD (generalized anxiety disorder) 05/14/2020  . Hypokalemia 05/14/2020  . Right shoulder pain 05/14/2020  . Loose stools 05/09/2020  . Dysphagia 05/09/2020  . Hemorrhoids 05/09/2020  . Fatigue 05/05/2020  . CHF (congestive heart failure) ([place]) 03/25/2020  . Respiratory failure ([place]) 03/25/2020  . Multiple fractures of ribs, right side, subsequent encounter for fracture  with routine healing 03/06/2020  . Hyponatremia 03/05/2020  . Diarrhea 07/24/2018  . Vaginitis and vulvovaginitis 05/22/2016  . Well adult exam 10/01/2015  . Chronic chest wall pain 09/09/2014  . Mitral valve regurgitation 06/21/2012  . Essential hypertension 01/03/2008  . Osteopenia 03/28/2007  . Hypothyroidism 02/16/2007    Past Surgical History:  Procedure Laterality Date  . ABDOMINAL HYSTERECTOMY  1975   partial  . APPENDECTOMY  1975   with hysterectomy  . HEMORROIDECTOMY     [phone]  . LAPAROSCOPIC SMALL BOWEL RESECTION N/A 11/29/2014   Procedure: LAPAROSCOPIC SMALL INTESTINE RESECTION;  Surgeon: Michael Boston, MD;  Location: [facility] ORS;  Service: General;  Laterality: N/A;  . VARICOSE VEIN SURGERY Left 1971     OB History   No obstetric history on file.     Family History  Problem Relation Age of Onset  . Bone cancer Mother   . Diabetes Father   . Stroke Father   . Breast cancer Sister   . Stomach cancer Neg Hx   . Colon cancer Neg Hx     Social History   Tobacco Use  . Smoking status: Never Smoker  . Smokeless tobacco: Never Used  Vaping Use  . Vaping Use: Never used  Substance Use Topics  . Alcohol use: No    Alcohol/week: 0.0 standard drinks  . Drug use: No    Home Medications Prior to Admission medications   Medication Sig Start Date End Date Taking? Authorizing Provider  ALPRAZolam Duanne Moron)  0.25 MG tablet Take 0.25 mg by mouth at bedtime as needed for anxiety.   Yes [provider]  amLODipine (NORVASC) 5 MG tablet Take 1 tablet (5 mg total) by mouth daily. 06/02/20  Yes Luetta Nutting, DO  CRANBERRY-CALCIUM PO Take 1 tablet by mouth daily.   Yes [provider]  cycloSPORINE (RESTASIS) 0.05 % ophthalmic emulsion Place 1 drop into both eyes 2 (two) times daily. 04/24/20  Yes Ngetich, Dinah C, NP  diphenoxylate-atropine (LOMOTIL) 2.5-0.025 MG tablet Take one tablet 1-2x per day as needed for diarrhea. Patient taking differently:  Take 1 tablet by mouth daily as needed for diarrhea or loose stools.  06/02/20  Yes Luetta Nutting, DO  furosemide (LASIX) 40 MG tablet Take 1 tablet (40 mg total) by mouth daily. 05/26/20  Yes Luetta Nutting, DO  hydrALAZINE (APRESOLINE) 25 MG tablet Take 1 tablet (25 mg total) by mouth every 8 (eight) hours. 05/26/20  Yes Luetta Nutting, DO  levofloxacin (LEVAQUIN) 500 MG tablet Take 1 tablet (500 mg total) by mouth daily. Patient taking differently: Take 500 mg by mouth daily. Start Date : 06/07/20 06/07/20  Yes Varney Biles, MD  levothyroxine (SYNTHROID) 88 MCG tablet Take 1 tablet (88 mcg total) by mouth daily before breakfast. 05/14/20  Yes Luetta Nutting, DO  mirtazapine (REMERON) 15 MG tablet Take 0.5 tablets (7.5 mg total) by mouth at bedtime. Patient taking differently: Take 15 mg by mouth at bedtime.  06/02/20  Yes Luetta Nutting, DO  pantoprazole (PROTONIX) 40 MG tablet Take 1 tablet (40 mg total) by mouth daily. 05/12/20  Yes Luetta Nutting, DO  potassium chloride 20 MEQ/15ML (10%) SOLN Take 30 mLs (40 mEq total) by mouth daily. 05/12/20 08/10/20 Yes Luetta Nutting, DO  saccharomyces boulardii (FLORASTOR) 250 MG capsule Take 1 capsule (250 mg total) by mouth 2 (two) times daily. 05/12/20 06/11/20 Yes Luetta Nutting, DO  Menthol, Topical Analgesic, (BIOFREEZE) 4 % GEL Apply 3 oz topically 3 (three) times daily as needed. Patient not taking: Reported on 06/10/2020 04/24/20   Ngetich, Dinah C, NP    Allergies    Codeine and Sulfamethoxazole  Review of Systems   Review of Systems  Unable to perform ROS: Mental status change    Physical Exam Updated Vital Signs BP (!) 144/70 (BP Location: Left Arm)   Pulse 90   Temp 98.4 F (36.9 C) (Oral)   Resp (!) 22   SpO2 96%   Physical Exam Vitals and nursing note reviewed.  Constitutional:      Appearance: She is well-developed. She is ill-appearing.     Comments: Pt is somnolent. Will somewhat arouse to verbal and touch - patient  verbally responds but speech is incoherent.  HENT:     Head: Normocephalic and atraumatic.     Mouth/Throat:     Mouth: Mucous membranes are dry.  Eyes:     Conjunctiva/sclera: Conjunctivae normal.     Comments: Pupils are constricted  Cardiovascular:     Rate and Rhythm: Normal rate and regular rhythm.     Comments: Intact DP and radial pulses Pulmonary:     Effort: Pulmonary effort is normal. No respiratory distress.     Comments: No wheezes or rhonchi Abdominal:     General: Bowel sounds are normal.     Palpations: Abdomen is soft.     Tenderness: There is no abdominal tenderness. There is no guarding or rebound.  Musculoskeletal:     Right lower leg: No edema.  Left lower leg: No edema.  Skin:    Comments: Skin is cool to the touch  Psychiatric:        Behavior: Behavior normal.     ED Results / Procedures / Treatments   Labs (all labs ordered are listed, but only abnormal results are displayed) Labs Reviewed  COMPREHENSIVE METABOLIC PANEL - Abnormal; Notable for the following components:      Result Value   Sodium 133 (*)    Potassium 3.0 (*)    Chloride 94 (*)    Glucose, Bld 107 (*)    Calcium 8.5 (*)    Total Protein 6.2 (*)    Albumin 2.9 (*)    All other components within normal limits  CBC WITH DIFFERENTIAL/PLATELET - Abnormal; Notable for the following components:   RBC 3.17 (*)    Hemoglobin 9.3 (*)    HCT 28.9 (*)    All other components within normal limits  APTT - Abnormal; Notable for the following components:   aPTT 37 (*)    All other components within normal limits  RESP PANEL BY RT-PCR (FLU A&B, COVID) ARPGX2  URINE CULTURE  CULTURE, BLOOD (ROUTINE X 2)  CULTURE, BLOOD (ROUTINE X 2)  URINALYSIS, ROUTINE W REFLEX MICROSCOPIC  LACTIC ACID, PLASMA  PROTIME-INR  MAGNESIUM  MAGNESIUM  COMPREHENSIVE METABOLIC PANEL  CBC  PROTIME-INR  VITAMIN B12  FOLATE  IRON AND TIBC  FERRITIN  RETICULOCYTES  TSH    EKG EKG  Interpretation  Date/Time:  Tuesday June 10 2020 12:32:56 EST Ventricular Rate:  71 PR Interval:    QRS Duration: 157 QT Interval:  518 QTC Calculation: 563 R Axis:   7 Text Interpretation: Sinus rhythm Paired ventricular premature complexes Left bundle branch block Confirmed by Lacretia Leigh ([id number]) on 06/10/2020 3:14:18 PM   Radiology DG Chest Port 1 View  Result Date: 06/10/2020 CLINICAL DATA:  Questionable sepsis, UTI on antibiotics, having hallucinations, unsteady on feet, increased falls, hypertension EXAM: PORTABLE CHEST 1 VIEW COMPARISON:  Portable exam 1307 hours compared to 06/06/2020 FINDINGS: Enlargement of cardiac silhouette. Rotated to the RIGHT with kyphotic positioning. Mediastinal contours and pulmonary vascularity normal. Atelectasis and questionable infiltrate at RIGHT base with associated RIGHT pleural effusion. Minimal subsegmental atelectasis LEFT base. Remaining lungs clear. No pneumothorax. Bones demineralized. IMPRESSION: Atelectasis and small pleural effusion at RIGHT base with questionable associated RIGHT basilar infiltrate. Minimal subsegmental atelectasis LEFT base. Electronically Signed   By: Lavonia Dana M.D.   On: 06/10/2020 13:14   CT RENAL STONE STUDY  Result Date: 06/10/2020 CLINICAL DATA:  Frequent UTI EXAM: CT ABDOMEN AND PELVIS WITHOUT CONTRAST TECHNIQUE: Multidetector CT imaging of the abdomen and pelvis was performed following the standard protocol without IV contrast. COMPARISON:  2016 FINDINGS: Lower chest: Cardiomegaly. Partially imaged right greater than left pleural effusions with adjacent atelectasis/consolidation. Hepatobiliary: No focal liver abnormality is seen. No gallstones, gallbladder wall thickening, or biliary dilatation. Pancreas: Unremarkable. Spleen: Unremarkable. Adrenals/Urinary Tract: Small cyst of the upper pole of the right kidney. No renal calculi hydronephrosis. There are no ureteral calculi identified. The bladder is poorly  distended. Stomach/Bowel: Stomach is within normal limits apart from possible small hiatal hernia. Bowel is normal in caliber. There is an anastomosis in the right lower quadrant. Vascular/Lymphatic: Aortic atherosclerosis. No enlarged lymph nodes identified. Reproductive: Status post hysterectomy. No adnexal masses. Other: No ascites.  Mild body wall edema. Musculoskeletal: Degenerative changes of the included spine particularly at L4-L5 and L5-S1. No acute osseous abnormality. IMPRESSION: No urinary  tract calculi. Pyelonephritis is not well evaluated on this noncontrast study. Partially imaged right greater than left pleural effusions with adjacent atelectasis/consolidation. Cardiomegaly. Electronically Signed   By: Macy Mis M.D.   On: 06/10/2020 19:48    Procedures .Critical Care Performed by: Helder Crisafulli, [place] N, PA-C Authorized by: Ryelynn Guedea, [place] N, PA-C   Critical care provider statement:    Critical care time (minutes):  45   Critical care time was exclusive of:  Separately billable procedures and treating other patients and teaching time   Critical care was necessary to treat or prevent imminent or life-threatening deterioration of the following conditions:  Sepsis   Critical care was time spent personally by me on the following activities:  Discussions with consultants, evaluation of patient's response to treatment, examination of patient, ordering and performing treatments and interventions, ordering and review of laboratory studies, ordering and review of radiographic studies, pulse oximetry, re-evaluation of patient's condition, obtaining history from patient or surrogate and review of old charts   I assumed direction of critical care for this patient from another provider in my specialty: no     (including critical care time)  Medications Ordered in ED Medications  lactated ringers infusion ( Intravenous New Bag/Given 06/10/20 2035)  ceFEPIme (MAXIPIME) 2 g in sodium chloride  0.9 % 100 mL IVPB (0 g Intravenous Stopped 06/10/20 1433)  potassium chloride 10 mEq in 100 mL IVPB (10 mEq Intravenous New Bag/Given 06/10/20 2202)  levothyroxine (SYNTHROID) tablet 88 mcg (has no administration in time range)  pantoprazole (PROTONIX) EC tablet 40 mg (has no administration in time range)  saccharomyces boulardii (FLORASTOR) capsule 250 mg (has no administration in time range)  cycloSPORINE (RESTASIS) 0.05 % ophthalmic emulsion 1 drop (has no administration in time range)  potassium chloride 20 MEQ/15ML (10%) solution 40 mEq (has no administration in time range)  enoxaparin (LOVENOX) injection 40 mg (has no administration in time range)  acetaminophen (TYLENOL) tablet 650 mg (has no administration in time range)    Or  acetaminophen (TYLENOL) suppository 650 mg (has no administration in time range)  ondansetron (ZOFRAN) tablet 4 mg (has no administration in time range)    Or  ondansetron (ZOFRAN) injection 4 mg (has no administration in time range)  hydrALAZINE (APRESOLINE) injection 5 mg (has no administration in time range)  lactated ringers bolus 1,000 mL (0 mLs Intravenous Stopped 06/10/20 1434)    And  lactated ringers bolus 500 mL (0 mLs Intravenous Stopped 06/10/20 1653)    And  lactated ringers bolus 250 mL (0 mLs Intravenous Stopped 06/10/20 1653)  potassium chloride 10 mEq in 100 mL IVPB ( Intravenous Stopped 06/10/20 1652)  lip balm (CARMEX) ointment ( Topical Given 06/10/20 1534)    ED Course  I have reviewed the triage vital signs and the nursing notes.  Pertinent labs & imaging results that were available during my care of the patient were reviewed by me and considered in my medical decision making (see chart for details).  Clinical Course as of Jun 11 2203  Tue Jun 10, 2020  1456 Patient re-evaluated, patient is more alert, temp is improving. Patient's speech is slurred is difficult to understand, is speaking about her daughter and asking for her to be  called. Pt stating she is hot and requesting bear hugger be removed. Pt also stating she is thirsty.   [JR]    Clinical Course User Index [JR] Veronia Laprise, [place] N, PA-C   MDM Rules/Calculators/A&P  Patient presenting from home with altered mental status, history of recurrent UTI on Levaquin. Patient is normally oriented at baseline per patient's daughter.  On arrival, patient is hypothermic per rectal temp to 93.9 F, placed on [facility].  She is also somnolent, will respond to both verbal and touch, not following commands.  No respiratory distress is noted.  Abdomen is soft does not appear to be tender.  Code sepsis was initiated, IV cefepime ordered to cover suspected UTI as source with recent urine culture growing E. Coli.   Patient's temperature is improved on Bair hugger, blood pressure initially soft though improved as well with IV fluids.  She is also noted to be intermittently hypoxic, placed on 2 L nasal cannula.  She is much more alert on reevaluation.  Chest x-ray with persistent findings of possible basilar opacity, discussed again seen.  Actiq acid is within normal limits.  Delay in urine collection, UA pending patient is admitted to hospital service for further management of sepsis.  Final Clinical Impression(s) / ED Diagnoses Final diagnoses:  Sepsis with encephalopathy without septic shock, due to unspecified organism [facility])    Rx / DC Orders ED Discharge Orders    None       Amyre Segundo, [place] N, PA-C 06/10/20 2204    Lacretia Leigh, MD 06/11/20 [phone]

## 2020-06-10 NOTE — ED Notes (Signed)
Patient having anxieties

## 2020-06-10 NOTE — ED Triage Notes (Signed)
Per EMS-currently being treated for a UTI-started antibiotics on 11/19-states continued having hallucinations, unsteady on feet-increased falls

## 2020-06-10 NOTE — H&P (Addendum)
[facility] History and Physical  MYNDI WAMBLE MVH:[id number] DOB: 1936/03/27 DOA: 06/10/2020   PCP: Luetta Nutting, DO  Specialists: None  Chief Complaint: Hallucinations  HPI: Christy Wade is a 84 y.o. female with a past medical history of generalized anxiety disorder, essential hypertension, hypothyroidism, anemia, osteoporosis who was brought into the hospital due to hallucinations that started over the last 24 hours.  Patient noted to be confused and anxious.  Not a very good historian.  Lot of information was obtained from her daughter.  Apparently ever since August patient has been having UTIs.  She has had falls as a result of these infections.  She has been on multiple courses of antibiotics including Keflex, nitrofurantoin, ciprofloxacin and most recently Levaquin.  Based on information in our system it looks like she has had positive urine cultures Klebsiella back in August, E. coli in October, and most recently Enterobacter on November 15.  There is also positive urine culture for Staph hominis from November 19 although it was an insignificant growth.  Patient has had falls as a result of her UTIs.  She did have an abnormal right shoulder x-ray.  She was also found to have multiple rib fractures.  She was prescribed Levaquin during her last ER visit which she has taken for about 3 days now.  Hallucinations started about 1 to 2 days ago.  No falls reported in the last 24 hours.  Once again history is limited from the patient.  In the emergency department patient was noted to be hypothermic with borderline low blood pressures.  She was found to have elevated lactic acid level.  She was thought to have sepsis.  Sepsis protocol was initiated.  Patient will need hospitalization for further management.  Home Medications: Prior to Admission medications   Medication Sig Start Date End Date Taking? Authorizing Provider  ALPRAZolam Duanne Moron) 0.25 MG tablet Take 0.25 mg by mouth at  bedtime as needed for anxiety.   Yes [provider]  amLODipine (NORVASC) 5 MG tablet Take 1 tablet (5 mg total) by mouth daily. 06/02/20  Yes Luetta Nutting, DO  CRANBERRY-CALCIUM PO Take 1 tablet by mouth daily.   Yes [provider]  cycloSPORINE (RESTASIS) 0.05 % ophthalmic emulsion Place 1 drop into both eyes 2 (two) times daily. 04/24/20  Yes Ngetich, Dinah C, NP  diphenoxylate-atropine (LOMOTIL) 2.5-0.025 MG tablet Take one tablet 1-2x per day as needed for diarrhea. Patient taking differently: Take 1 tablet by mouth daily as needed for diarrhea or loose stools.  06/02/20  Yes Luetta Nutting, DO  furosemide (LASIX) 40 MG tablet Take 1 tablet (40 mg total) by mouth daily. 05/26/20  Yes Luetta Nutting, DO  hydrALAZINE (APRESOLINE) 25 MG tablet Take 1 tablet (25 mg total) by mouth every 8 (eight) hours. 05/26/20  Yes Luetta Nutting, DO  levofloxacin (LEVAQUIN) 500 MG tablet Take 1 tablet (500 mg total) by mouth daily. Patient taking differently: Take 500 mg by mouth daily. Start Date : 06/07/20 06/07/20  Yes Varney Biles, MD  levothyroxine (SYNTHROID) 88 MCG tablet Take 1 tablet (88 mcg total) by mouth daily before breakfast. 05/14/20  Yes Luetta Nutting, DO  mirtazapine (REMERON) 15 MG tablet Take 0.5 tablets (7.5 mg total) by mouth at bedtime. Patient taking differently: Take 15 mg by mouth at bedtime.  06/02/20  Yes Luetta Nutting, DO  pantoprazole (PROTONIX) 40 MG tablet Take 1 tablet (40 mg total) by mouth daily. 05/12/20  Yes Luetta Nutting, DO  potassium chloride  20 MEQ/15ML (10%) SOLN Take 30 mLs (40 mEq total) by mouth daily. 05/12/20 08/10/20 Yes Luetta Nutting, DO  saccharomyces boulardii (FLORASTOR) 250 MG capsule Take 1 capsule (250 mg total) by mouth 2 (two) times daily. 05/12/20 06/11/20 Yes Luetta Nutting, DO  Menthol, Topical Analgesic, (BIOFREEZE) 4 % GEL Apply 3 oz topically 3 (three) times daily as needed. Patient not taking: Reported on 06/10/2020 04/24/20    Ngetich, Nelda Bucks, NP    Allergies:  Allergies  Allergen Reactions  . Codeine Nausea Only    REACTION: nausea  . Sulfamethoxazole Rash    REACTION: rash    Past Medical History: Past Medical History:  Diagnosis Date  . Anemia    on iron now  . Cataract   . Hypertension   . Hypothyroidism   . Osteoporosis   . PONV (postoperative nausea and vomiting)   . Thyroid disease   . Varicose vein     Past Surgical History:  Procedure Laterality Date  . ABDOMINAL HYSTERECTOMY  1975   partial  . APPENDECTOMY  1975   with hysterectomy  . HEMORROIDECTOMY     [phone]  . LAPAROSCOPIC SMALL BOWEL RESECTION N/A 11/29/2014   Procedure: LAPAROSCOPIC SMALL INTESTINE RESECTION;  Surgeon: Michael Boston, MD;  Location: [facility];  Service: General;  Laterality: N/A;  . VARICOSE VEIN SURGERY Left 1971    Social History: Lives by herself but has around-the-clock care.  Denies any alcohol use or illicit drug use.  Denies history of smoking.  Family History:  Family History  Problem Relation Age of Onset  . Bone cancer Mother   . Diabetes Father   . Stroke Father   . Breast cancer Sister   . Stomach cancer Neg Hx   . Colon cancer Neg Hx      Review of Systems -unable to do due to her altered mental status  Physical Examination  Vitals:   06/10/20 1645 06/10/20 1700 06/10/20 1730 06/10/20 1731  BP:  122/72 (!) 147/74   Pulse: 94 90 95   Resp: (!) 21 16 18    Temp:    97.6 F (36.4 C)  TempSrc:    Rectal  SpO2: 96% 100% 92%     BP (!) 147/74   Pulse 95   Temp 97.6 F (36.4 C) (Rectal)   Resp 18   SpO2 92%   General appearance: alert, distracted, no distress and Anxious Head: Normocephalic, without obvious abnormality, atraumatic Eyes: conjunctivae/corneas clear. PERRL, EOM's intact.  Throat: lips, mucosa, and tongue normal; teeth and gums normal Neck: no adenopathy, no carotid bruit, no JVD, supple, symmetrical, trachea midline and thyroid not enlarged, symmetric, no  tenderness/mass/nodules Resp: clear to auscultation bilaterally Cardio: regular rate and rhythm, S1, S2 normal, no murmur, click, rub or gallop GI: soft, non-tender; bowel sounds normal; no masses,  no organomegaly Extremities: Limited range of motion of the right shoulder.  No tenderness appreciated.  No significant swelling. Pulses: 2+ and symmetric Skin: Skin color, texture, turgor normal. No rashes or lesions Lymph nodes: Cervical, supraclavicular, and axillary nodes normal. Neurologic: Awake alert.  Anxious.  Knew she was in the hospital.  Did not know the year.  Knew the month.  No facial asymmetry.  Motor strength equal bilateral upper and lower extremities except for the right shoulder which has limited range of motion as discussed above.    Labs on Admission: I have personally reviewed following labs and imaging studies  CBC: Recent Labs  Lab 06/06/20 1450 06/10/20 1234  WBC 7.8 5.5  NEUTROABS 5.2 4.1  HGB 10.6* 9.3*  HCT 32.3* 28.9*  MCV 90.5 91.2  PLT 393 735   Basic Metabolic Panel: Recent Labs  Lab 06/06/20 1450 06/10/20 1234  NA 132* 133*  K 3.9 3.0*  CL 92* 94*  CO2 26 30  GLUCOSE 90 107*  BUN 12 14  CREATININE 0.99 0.86  CALCIUM 8.5* 8.5*  MG  --  1.8   GFR: Estimated Creatinine Clearance: 44.8 mL/min (by C-G formula based on SCr of 0.86 mg/dL). Liver Function Tests: Recent Labs  Lab 06/06/20 1450 06/10/20 1234  AST 27 26  ALT 18 18  ALKPHOS 96 81  BILITOT 0.8 0.8  PROT 7.0 6.2*  ALBUMIN 3.2* 2.9*   Coagulation Profile: Recent Labs  Lab 06/10/20 1234  INR 1.2     Radiological Exams on Admission: DG Chest Port 1 View  Result Date: 06/10/2020 CLINICAL DATA:  Questionable sepsis, UTI on antibiotics, having hallucinations, unsteady on feet, increased falls, hypertension EXAM: PORTABLE CHEST 1 VIEW COMPARISON:  Portable exam 1307 hours compared to 06/06/2020 FINDINGS: Enlargement of cardiac silhouette. Rotated to the RIGHT with kyphotic  positioning. Mediastinal contours and pulmonary vascularity normal. Atelectasis and questionable infiltrate at RIGHT base with associated RIGHT pleural effusion. Minimal subsegmental atelectasis LEFT base. Remaining lungs clear. No pneumothorax. Bones demineralized. IMPRESSION: Atelectasis and small pleural effusion at RIGHT base with questionable associated RIGHT basilar infiltrate. Minimal subsegmental atelectasis LEFT base. Electronically Signed   By: Lavonia Dana M.D.   On: 06/10/2020 13:14    My interpretation of Electrocardiogram: Sinus rhythm in the 70s.  LBBB is noted which has been seen previously as well.  PVCs.   Problem List  Active Problems:   Hypothyroidism   Essential hypertension   Hyponatremia   GAD (generalized anxiety disorder)   Recurrent UTI   Hallucinations   Sepsis ([place])   Hypoxia   Assessment: This is a 84 year old with past medical history as stated earlier who has had multiple episodes of UTI in the last 2-1/2 months with associated falls.  Comes in with hallucinations that started about 24 hours ago.  Hallucinations probably related to Levaquin.  Could also be due to underlying infection.  Plan:  1. Altered mental status with hallucinations: Could be due to fluoroquinolone.  Patient does not have any focal neurological deficits.  CT head done a few days ago did not show any acute findings.  Could also be due to sepsis as she was hypothermic.  We will discontinue the levofloxacin.  Reorient her.  2.  Sepsis due to unspecified organism/hypothermia: Patient noted to have hypothermia and elevated respiratory rate.  WBC is normal.  She was also noted to be confused.  This is likely due to UTI.  Chest x-ray also raise possibility for pneumonia.  She was noted to have elevated lactic acid levels.  Will discontinue levofloxacin as discussed above.  Patient will be placed on cefepime for now.  Follow-up on cultures.  UA noted to be clear this time around.  Recent urine  culture was positive for insignificant Staph hominis and prior to that she was positive for Enterobacter.  3. Recurrent UTIs: Reason for this is not entirely clear.  Per her daughter prior to August she was not having such frequent episodes.  We will do abdominal imaging studies to make sure there is no structural reason for her UTIs.  4.  Hypoxia: Patient does not have any respiratory symptoms.  Chest x-ray raised concern for possible  right basilar infiltrate although it could be atelectasis.  She did have rib fractures recently.  She was noted to be hypoxic in the ED and was placed on 2 L of oxygen.  Continue oxygen for now.  May benefit from repeating x-ray in the next 24 hours.  Incentive spirometry.  5.  Abnormal imaging of the right shoulder: She had an x-ray done a few days ago which showed a large area of cortical lucency in the medial aspect of the right humeral head.  Unclear if this is new or chronic.  She does have restricted range of motion of the right shoulder.  Based on previous notes it appears that the shoulder was discussed with Dr. Griffin Basil with orthopedics back in August.  Based on PCP notes it appears that patient is not a surgical candidate.  Will not do any further testing during this hospital stay.  6.  Rib fractures on the right: This is secondary to one of her previous fall.  Does not appear to be particularly symptomatic at this time.  7.  Essential hypertension: Noted to be hypotensive initially.  Keep her blood pressure medications on hold for now.  8.  Hypothyroidism: Continue with levothyroxine.  9. Normocytic anemia: Some drop in hemoglobin noted compared to her baseline which seems to be around 11.  No overt bleeding noted.  Check anemia panel.  Monitor hemoglobins.  10.  Hyponatremia and hypokalemia: Sodium level actually much better compared to August when it was as low as 111.  She was hospitalized back then and her hyponatremia was thought to be due to volume  depletion diuretics and perhaps even some element of SIADH.  She was seen by nephrology during that time.  Continue to monitor.  Potassium to be repleted.  Magnesium is noted to be 1.8.   DVT Prophylaxis: Lovenox Code Status: discussed with patient's daughter.  She will be DNR. Family Communication: With patient's daughter Christy Wade Disposition: Hopefully return home in improved Consults called: None at this time Admission Status: Status is: Inpatient  Remains inpatient appropriate because:Altered mental status and IV treatments appropriate due to intensity of illness or inability to take PO   Dispo: The patient is from: Home              Anticipated d/c is to: Home              Anticipated d/c date is: 2 days              Patient currently is not medically stable to d/c.    Severity of Illness: The appropriate patient status for this patient is INPATIENT. Inpatient status is judged to be reasonable and necessary in order to provide the required intensity of service to ensure the patient's safety. The patient's presenting symptoms, physical exam findings, and initial radiographic and laboratory data in the context of their chronic comorbidities is felt to place them at high risk for further clinical deterioration. Furthermore, it is not anticipated that the patient will be medically stable for discharge from the hospital within 2 midnights of admission. The following factors support the patient status of inpatient.   " The patient's presenting symptoms include altered mental status. " The worrisome physical exam findings include confusion, hypothermia. " The initial radiographic and laboratory data are worrisome because of possible pneumonia. " The chronic co-morbidities include essential hypertension.   * I certify that at the point of admission it is my clinical judgment that the patient will require inpatient  hospital care spanning beyond 2 midnights from the point of admission due  to high intensity of service, high risk for further deterioration and high frequency of surveillance required.*  Further management decisions will depend on results of further testing and patient's response to treatment.   Darrik Richman [employer]  [facility] operational officer on [email or web address]  06/10/2020, 5:58 PM

## 2020-06-10 NOTE — ED Notes (Addendum)
Patient's oxygen sat dropping in the mid 80s on room air and patient feeling like she cannot breathe.Patient placed on 2L oxygen, sat improved to upper 90s. [place], [place] aware.

## 2020-06-10 NOTE — Plan of Care (Signed)

## 2020-06-10 NOTE — ED Notes (Signed)
Bair hugger continuing due to patients continually low body temp.  Temperature now 95.3 rectal

## 2020-06-10 NOTE — ED Notes (Signed)
Bair hugger warmer for patients extremely low temp

## 2020-06-10 NOTE — ED Notes (Signed)
Patient keeps thinking that her daughter Christy Wade is here and we are keeping Christy Wade from her.  I've told patient several times that her daughter is not here, she still does not believe me.  Patient is still hallucinating. Helped patient call her daughter Christy Wade.   Patient now being transported to CT.

## 2020-06-11 ENCOUNTER — Inpatient Hospital Stay (HOSPITAL_COMMUNITY): Payer: Medicare PPO

## 2020-06-11 ENCOUNTER — Ambulatory Visit: Payer: Medicare PPO | Admitting: Family Medicine

## 2020-06-11 ENCOUNTER — Telehealth: Payer: Self-pay | Admitting: Family Medicine

## 2020-06-11 DIAGNOSIS — E871 Hypo-osmolality and hyponatremia: Secondary | ICD-10-CM | POA: Diagnosis not present

## 2020-06-11 DIAGNOSIS — F411 Generalized anxiety disorder: Secondary | ICD-10-CM | POA: Diagnosis not present

## 2020-06-11 DIAGNOSIS — R443 Hallucinations, unspecified: Secondary | ICD-10-CM | POA: Diagnosis not present

## 2020-06-11 DIAGNOSIS — I10 Essential (primary) hypertension: Secondary | ICD-10-CM | POA: Diagnosis not present

## 2020-06-11 LAB — COMPREHENSIVE METABOLIC PANEL
ALT: 17 U/L (ref 0–44)
AST: 20 U/L (ref 15–41)
Albumin: 2.5 g/dL — ABNORMAL LOW (ref 3.5–5.0)
Alkaline Phosphatase: 75 U/L (ref 38–126)
Anion gap: 10 (ref 5–15)
BUN: 13 mg/dL (ref 8–23)
CO2: 24 mmol/L (ref 22–32)
Calcium: 8.1 mg/dL — ABNORMAL LOW (ref 8.9–10.3)
Chloride: 99 mmol/L (ref 98–111)
Creatinine, Ser: 0.81 mg/dL (ref 0.44–1.00)
GFR, Estimated: >60 mL/min (ref >60)
Glucose, Bld: 92 mg/dL (ref 70–99)
Potassium: 4 mmol/L (ref 3.5–5.1)
Sodium: 133 mmol/L — ABNORMAL LOW (ref 135–145)
Total Bilirubin: 0.5 mg/dL (ref 0.3–1.2)
Total Protein: 5.5 g/dL — ABNORMAL LOW (ref 6.5–8.1)

## 2020-06-11 LAB — CULTURE, BLOOD (ROUTINE X 2)
Culture: NO GROWTH
Culture: NO GROWTH
Special Requests: ADEQUATE
Special Requests: ADEQUATE

## 2020-06-11 LAB — FOLATE: Folate: 8 ng/mL (ref >5.9)

## 2020-06-11 LAB — IRON AND TIBC
Iron: 26 ug/dL — ABNORMAL LOW (ref 28–170)
Saturation Ratios: 15 % (ref 10.4–31.8)
TIBC: 178 ug/dL — ABNORMAL LOW (ref 250–450)
UIBC: 152 ug/dL

## 2020-06-11 LAB — D-DIMER, QUANTITATIVE: D-Dimer, Quant: 4 ug/mL-FEU — ABNORMAL HIGH (ref 0.00–0.50)

## 2020-06-11 LAB — PROTIME-INR
INR: 1.3 — ABNORMAL HIGH (ref 0.8–1.2)
Prothrombin Time: 15.6 seconds — ABNORMAL HIGH (ref 11.4–15.2)

## 2020-06-11 LAB — CBC
HCT: 27.2 % — ABNORMAL LOW (ref 36.0–46.0)
Hemoglobin: 8.4 g/dL — ABNORMAL LOW (ref 12.0–15.0)
MCH: 29.4 pg (ref 26.0–34.0)
MCHC: 30.9 g/dL (ref 30.0–36.0)
MCV: 95.1 fL (ref 80.0–100.0)
Platelets: 291 10*3/uL (ref 150–400)
RBC: 2.86 MIL/uL — ABNORMAL LOW (ref 3.87–5.11)
RDW: 15.8 % — ABNORMAL HIGH (ref 11.5–15.5)
WBC: 7.5 10*3/uL (ref 4.0–10.5)
nRBC: 0 % (ref 0.0–0.2)

## 2020-06-11 LAB — RETICULOCYTES
Immature Retic Fract: 25.1 % — ABNORMAL HIGH (ref 2.3–15.9)
RBC.: 2.88 MIL/uL — ABNORMAL LOW (ref 3.87–5.11)
Retic Count, Absolute: 108.6 10*3/uL (ref 19.0–186.0)
Retic Ct Pct: 3.8 % — ABNORMAL HIGH (ref 0.4–3.1)

## 2020-06-11 LAB — MAGNESIUM: Magnesium: 1.7 mg/dL (ref 1.7–2.4)

## 2020-06-11 LAB — TSH: TSH: 3.15 u[IU]/mL (ref 0.350–4.500)

## 2020-06-11 LAB — VITAMIN B12: Vitamin B-12: 395 pg/mL (ref 180–914)

## 2020-06-11 LAB — FERRITIN: Ferritin: 229 ng/mL (ref 11–307)

## 2020-06-11 MED ORDER — MAGNESIUM SULFATE 2 GM/50ML IV SOLN
2.0000 g | Freq: Once | INTRAVENOUS | Status: AC
  Administered 2020-06-11: 2 g via INTRAVENOUS
  Filled 2020-06-11: qty 50

## 2020-06-11 MED ORDER — CYANOCOBALAMIN 1000 MCG/ML IJ SOLN
1000.0000 ug | Freq: Once | INTRAMUSCULAR | Status: AC
  Administered 2020-06-11: 1000 ug via INTRAMUSCULAR
  Filled 2020-06-11 (×2): qty 1

## 2020-06-11 MED ORDER — INFLUENZA VAC A&B SA ADJ QUAD 0.5 ML IM PRSY
0.5000 mL | PREFILLED_SYRINGE | INTRAMUSCULAR | Status: DC
  Filled 2020-06-11: qty 0.5

## 2020-06-11 MED ORDER — ADULT MULTIVITAMIN W/MINERALS CH
1.0000 | ORAL_TABLET | Freq: Every day | ORAL | Status: DC
  Administered 2020-06-12 – 2020-06-19 (×8): 1 via ORAL
  Filled 2020-06-11 (×8): qty 1

## 2020-06-11 MED ORDER — FUROSEMIDE 10 MG/ML IJ SOLN
20.0000 mg | Freq: Once | INTRAMUSCULAR | Status: AC
  Administered 2020-06-11: 20 mg via INTRAVENOUS
  Filled 2020-06-11: qty 2

## 2020-06-11 MED ORDER — ENSURE ENLIVE PO LIQD
237.0000 mL | Freq: Two times a day (BID) | ORAL | Status: DC
  Administered 2020-06-11 – 2020-06-18 (×9): 237 mL via ORAL

## 2020-06-11 NOTE — Progress Notes (Signed)
Chaplain visited patient while rounding on the floor.  Patient receptive to visit and said "I think you visited me before when I was here."  Patient spoke with chaplain about her UTI and her waiting in the ED before getting a room.  She said she has been involved in church her whole life and took piano lessons to learn to play hymns to the children.  She says she lives in [place] and has 24 hour care. When she fell, someone was with her. Patient's daughter called while chaplain was there, and chaplain encouraged her to speak with her before closing with prayer. Rev. Tamsen Snider  Pager [phone]

## 2020-06-11 NOTE — Progress Notes (Addendum)
Patients reported feeling SOB, RR increased 24, O2 sat 86-88% 3 lt, sat increased at 5lt to 95%. Will continue to monitor. Provider contacted.

## 2020-06-11 NOTE — Progress Notes (Signed)
Physical Therapy Treatment Patient Details Name: Christy Wade MRN: [id number] DOB: 06/06/1936 Today's Date: 06/11/2020    History of Present Illness Christy Wade is a 84 y.o. female with a past medical history of generalized anxiety disorder, essential hypertension, hypothyroidism, anemia, osteoporosis ,  h/o Rt 7-9 rib fractures and Rt shoulder AC joint seperated shoudler in 9/21  who was brought into the hospital 06/10/20 due to hallucinations , being treated for UTI,In the emergency department patient was  hypothermic with borderline low blood pressures,elevated lactic acid level. SOB overnight 11/24 and placed on @ L O2    PT Comments    Patient did stand briefly with 2 armhold. Patient noted with tremors and feet sliding forward, posterior leaning.  Patient with noted Dyspnea 3/4, asking to take  Breaks "to catch my breath". Spo2 91% while mobilizing, on 2 L [place].  Follow Up Recommendations  SNF;Supervision/Assistance - 24 hour vs home _ pt reports that she has 24/7 assistance.     Equipment Recommendations  None recommended by PT    Recommendations for Other Services       Precautions / Restrictions Precautions Precautions: Fall Precaution Comments: R AC separation right rib fxs in september    Mobility  Bed Mobility Overal bed mobility: Needs Assistance Bed Mobility: Supine to Sit;Sit to Supine     Supine to sit: Min assist Sit to supine: Max assist   General bed mobility comments: patient able to move the legs to bed edge, required assistance  for trunk, patient leaning back. noted jerkiness in arms and legs.Assisted back to supine and placed bedpan .  Transfers Overall transfer level: Needs assistance Equipment used: 2 person hand held assist Transfers: Sit to/from Stand Sit to Stand: +2 safety/equipment;+2 physical assistance         General transfer comment: Stood x 1 from bed with 2 armhold, feet slliding forward, patient shakey  Ambulation/Gait                  Stairs             Wheelchair Mobility    Modified Rankin (Stroke Patients Only)       Balance Overall balance assessment: Needs assistance Sitting-balance support: Feet supported;Bilateral upper extremity supported Sitting balance-Leahy Scale: Poor Sitting balance - Comments: trunk leaning back Postural control: Posterior lean Standing balance support: Bilateral upper extremity supported;During functional activity Standing balance-Leahy Scale: Poor Standing balance comment: unable                            Cognition Arousal/Alertness: Awake/alert Behavior During Therapy: WFL for tasks assessed/performed Overall Cognitive Status: Impaired/Different from baseline Area of Impairment: Memory                               General Comments: oriented to place and date but perseverated on having an appt today with PCP and will he come here or her family pick her up. Explained  MD's in hospital will take care of her,      Exercises      General Comments        Pertinent Vitals/Pain Pain Assessment: Faces Faces Pain Scale: Hurts little more Pain Location: right arm/right side Pain Descriptors / Indicators: Discomfort;Grimacing;Guarding Pain Intervention(s): Monitored during session    Home Living Family/patient expects to be discharged to:: Private residence Living Arrangements: Children;Non-relatives/Friends Available Help at Discharge: Personal  care attendant;Family;Home health;Available 24 hours/day Type of Home: House Home Access: Stairs to enter   Home Layout: One level Home Equipment: Clinical cytogeneticist - 2 wheels;Cane - single point;Cane - quad;Grab bars - tub/shower Additional Comments: pt states she has hired 24/7 caregivers and family    Prior Function Level of Independence: Needs assistance  Gait / Transfers Assistance Needed: Reports use of walker and caregivers with her to stand (they use gait belt) and  perform mobility ADL's / Homemaking Assistance Needed: Assistance for ADLs. reports varying levels of assistance. Comments: uses Rw and assistance to ambulate.   PT Goals (current goals can now be found in the care plan section) Acute Rehab PT Goals Patient Stated Goal: go home PT Goal Formulation: With patient Time For Goal Achievement: 06/25/20 Potential to Achieve Goals: Fair Progress towards PT goals: Progressing toward goals    Frequency    Min 2X/week      PT Plan Current plan remains appropriate    Co-evaluation PT/OT/SLP Co-Evaluation/Treatment: Yes Reason for Co-Treatment: For patient/therapist safety PT goals addressed during session: Mobility/safety with mobility OT goals addressed during session: ADL's and self-care      AM-PAC PT "6 Clicks" Mobility   Outcome Measure  Help needed turning from your back to your side while in a flat bed without using bedrails?: A Little Help needed moving from lying on your back to sitting on the side of a flat bed without using bedrails?: A Little Help needed moving to and from a bed to a chair (including a wheelchair)?: Total Help needed standing up from a chair using your arms (e.g., wheelchair or bedside chair)?: Total Help needed to walk in hospital room?: Total Help needed climbing 3-5 steps with a railing? : Total 6 Click Score: 10    End of Session Equipment Utilized During Treatment: Gait belt Activity Tolerance: Patient limited by fatigue Patient left: in bed;with call bell/phone within reach;with bed alarm set Nurse Communication: Mobility status PT Visit Diagnosis: Unsteadiness on feet (R26.81);Difficulty in walking, not elsewhere classified (R26.2);History of falling (Z91.81)     Time: 0623-7628 PT Time Calculation (min) (ACUTE ONLY): 23 min  Charges:  $Therapeutic Activity: 8-22 mins                     Tresa Endo PT Acute Rehabilitation Services Pager [phone] Office [phone]    Claretha Cooper 06/11/2020, 1:33 PM

## 2020-06-11 NOTE — Progress Notes (Signed)
Patient will travel to MRI this am. Provider contacted, order received, Pt may travel without cardiac monitor.

## 2020-06-11 NOTE — Telephone Encounter (Signed)
Patient's daughter Manuela Schwartz called and LVM, patient is currently at [facility], said they are doing testing to see if patient had a possible mini stroke. Patient had an appt this afternoon with Dr. Zigmund Daniel at 1:40pm, but I have taken patient off schedule and patient's daughter said they will call back to make a hospital follow up later. AM

## 2020-06-11 NOTE — Evaluation (Signed)
Physical Therapy Evaluation Patient Details Name: Christy Wade MRN: [id number] DOB: 23-Apr-1936 Today's Date: 06/11/2020   History of Present Illness  Christy Wade is a 84 y.o. female with a past medical history of generalized anxiety disorder, essential hypertension, hypothyroidism, anemia, osteoporosis ,  h/o Rt 7-9 rib fractures and Rt shoulder AC joint seperated shoudler in 9/21  who was brought into the hospital 06/10/20 due to hallucinations , being treated for UTI,In the emergency department patient was  hypothermic with borderline low blood pressures,elevated lactic acid level. SOB overnight 11/24 and placed on 2 L O2  Clinical Impression  The patient  Resting in bed on 2 L [place], SPO2 94%. Patient assisted to sitting on edge of bed with mod assistance for trunk. Patient with jerking of arms and trunk, leaning posteriorly. Attempted to stand x 1 without success, feet sliding and leaning backwards. Assisted back into bed and placed on Bed pan. Patient reports that she has 24/7 caregivers. If so, may be able to return home, Otherwise may benefit fromSNF Pt admitted with above diagnosis.   Pt currently with functional limitations due to the deficits listed below (see PT Problem List). Pt will benefit from skilled PT to increase their independence and safety with mobility to allow discharge to the venue listed below.       Follow Up Recommendations SNF;Supervision/Assistance - 24 hour vs. HHPT( which pt states is active) and if she has 24/7 caregivers.    Equipment Recommendations  None recommended by PT    Recommendations for Other Services       Precautions / Restrictions Precautions Precautions: Fall Precaution Comments: R AC separation right rib fxs in september      Mobility  Bed Mobility Overal bed mobility: Needs Assistance Bed Mobility: Supine to Sit;Sit to Supine     Supine to sit: Min assist Sit to supine: Max assist   General bed mobility comments: patient  able to move the legs to bed edge, required assistance  for trunk, patient leaning back. noted jerkiness in arms and legs.Assisted back to supine and placed bedpan .    Transfers Overall transfer level: Needs assistance   Transfers: Sit to/from Stand           General transfer comment: attempted to stand x 1  , feet sliding forward, unable to transfer to [facility].  Ambulation/Gait                Stairs            Wheelchair Mobility    Modified Rankin (Stroke Patients Only)       Balance Overall balance assessment: History of Falls;Needs assistance Sitting-balance support: Feet supported;Bilateral upper extremity supported Sitting balance-Leahy Scale: Poor Sitting balance - Comments: trunk lening back Postural control: Posterior lean     Standing balance comment: unable                             Pertinent Vitals/Pain Pain Assessment: Faces Faces Pain Scale: Hurts little more Pain Location: right arm Pain Descriptors / Indicators: Discomfort;Grimacing;Guarding Pain Intervention(s): Monitored during session    Home Living Family/patient expects to be discharged to:: Private residence Living Arrangements: Children;Non-relatives/Friends Available Help at Discharge: Personal care attendant;Family;Home health;Available 24 hours/day Type of Home: House Home Access: Stairs to enter   [employer] of Steps: 2 steps carport Home Layout: One level Home Equipment: Clinical cytogeneticist - 2 wheels;Cane - single point;Cane - quad;Grab bars -  tub/shower Additional Comments: pt states she has hired 24/7 caregivers and family    Prior Function Level of Independence: Needs assistance      ADL's / Homemaking Assistance Needed: has caregivers  Comments: uses Rw and assistance to ambulate.     Hand Dominance        Extremity/Trunk Assessment   Upper Extremity Assessment Upper Extremity Assessment: Defer to OT evaluation    Lower  Extremity Assessment Lower Extremity Assessment: Generalized weakness    Cervical / Trunk Assessment Cervical / Trunk Assessment: Kyphotic  Communication      Cognition Arousal/Alertness: Awake/alert Behavior During Therapy: WFL for tasks assessed/performed Overall Cognitive Status: Impaired/Different from baseline Area of Impairment: Memory                               General Comments: oriented to place and date but perseverated on having an appt today with PCP and will he come here or her family pick her up. Explained  MD's in hospital will take care of her,      General Comments      Exercises     Assessment/Plan    PT Assessment Patient needs continued PT services  PT Problem List Decreased strength;Decreased cognition;Decreased knowledge of use of DME;Decreased activity tolerance;Decreased range of motion;Decreased safety awareness;Decreased balance;Decreased knowledge of precautions;Decreased mobility       PT Treatment Interventions      PT Goals (Current goals can be found in the Care Plan section)  Acute Rehab PT Goals Patient Stated Goal: go home PT Goal Formulation: With patient Time For Goal Achievement: 06/25/20 Potential to Achieve Goals: Fair    Frequency Min 2X/week   Barriers to discharge        Co-evaluation PT/OT/SLP Co-Evaluation/Treatment: Yes Reason for Co-Treatment: For patient/therapist safety PT goals addressed during session: Mobility/safety with mobility OT goals addressed during session: ADL's and self-care       AM-PAC PT "6 Clicks" Mobility  Outcome Measure Help needed turning from your back to your side while in a flat bed without using bedrails?: A Lot Help needed moving from lying on your back to sitting on the side of a flat bed without using bedrails?: A Lot Help needed moving to and from a bed to a chair (including a wheelchair)?: Total Help needed standing up from a chair using your arms (e.g., wheelchair or  bedside chair)?: Total Help needed to walk in hospital room?: Total Help needed climbing 3-5 steps with a railing? : Total 6 Click Score: 8    End of Session Equipment Utilized During Treatment: Gait belt Activity Tolerance: Patient limited by fatigue Patient left: in bed;with call bell/phone within reach;with bed alarm set Nurse Communication: Mobility status PT Visit Diagnosis: Unsteadiness on feet (R26.81);Difficulty in walking, not elsewhere classified (R26.2);History of falling (Z91.81)    Time: 9518-8416 PT Time Calculation (min) (ACUTE ONLY): 26 min   Charges:   PT Evaluation $PT Eval Low Complexity: 1 Low PT Treatments $Therapeutic Activity: 8-22 mins        Tresa Endo PT Acute Rehabilitation Services Pager [phone] Office [phone]   Claretha Cooper 06/11/2020, 10:22 AM

## 2020-06-11 NOTE — Progress Notes (Signed)
On call paged for changes in patient status as follows: patient has developed dyspnea with movement in bed, desaturates on 2L [place], fine crackles bilaterally

## 2020-06-11 NOTE — Progress Notes (Signed)
Initial Nutrition Assessment  RD working remotely.  DOCUMENTATION CODES:   Not applicable  INTERVENTION:  - will order Ensure Enlive BID, each supplement provides 350 kcal and 20 grams of protein. - will complete NFPE at follow-up.  NUTRITION DIAGNOSIS:   Increased nutrient needs related to acute illness as evidenced by estimated needs.  GOAL:   Patient will meet greater than or equal to 90% of their needs  MONITOR:   PO intake, Supplement acceptance, Labs, Weight trends  REASON FOR ASSESSMENT:   Malnutrition Screening Tool    ASSESSMENT:   84 year old female with medical history of GAD, HTN, hypothyroidism, anemia, and osteoporosis. Patient presented to the ED due to hallucinations that started over 24 hours prior. It was reported that over the past 4-5 months she has had recurrent UTIs associated with significant weakness and several falls. In the ED, she was found to be hypothermic.  Patient noted to be out of the room to MRI. No intakes documented since diet advancement yesterday from NPO to FLD at 1756.   Weight yesterday was 127 lb and weight on 03/31/20 was 140 lb. This indicates 13 lb weight loss (9.3% body weight) in the past 2 months; significant for time frame.  Unable to determine if patient meets criteria for malnutrition without completing NFPE.   Per notes: - acute metabolic encephalopathy with hallucinations - sepsis  - acute hypoxic respiratory failure - R rib fractures d/t previous falls   Labs reviewed; Na: 133 mmol/l, Ca: 8.1 mg/dl. Medications reviewed; 1000 mcg IM cyanocobalamin x1 dose 11/24, 88 mcg oral synthroid/day, 2 g IV Mg x1 dose 11/24, 40 mg oral protonix/day, 10 mEq IV KCl x3 doses 11/13, 40 mEq KCl/day, 250 mg florastor BID.     NUTRITION - FOCUSED PHYSICAL EXAM:  unable to complete at this time.   Diet Order:   Diet Order            Diet full liquid Room service appropriate? Yes; Fluid consistency: Thin  Diet effective now                  EDUCATION NEEDS:   No education needs have been identified at this time  Skin:  Skin Assessment: Reviewed RN Assessment  Last BM:  11/24 (type 5)  Height:   Ht Readings from Last 1 Encounters:  06/10/20 5\' 7"  (1.702 m)    Weight:   Wt Readings from Last 1 Encounters:  06/10/20 57.8 kg     Estimated Nutritional Needs:  Kcal:  1600-1800 kcal Protein:  70-85 grams Fluid:  >/= 1.8 L/day      Jarome Matin, MS, RD, LDN, CNSC Inpatient Clinical Dietitian RD pager # available in [facility]  After hours/weekend pager # available in [facility]

## 2020-06-11 NOTE — Progress Notes (Addendum)
Progressive dyspnea this morning associated with bilateral fine crackles and decreased oxygen saturation. Patient admitted with altered mental status/sepsis due to unknown source. Prior chest xray shows Atelectasis and small pleural effusion at RIGHT base with questionable associated RIGHT basilar infiltrate.   -Stop IVFs for now pending further evaluation -Supplemental O2 as needed to maintain O2 saturations 88 to 92% -Follow intermittent ABG and chest x-ray as needed -Repeat CXR to evaluate for vascular congestion -IV Lasix if evidence of volume overload noted on xray,however given underlying sepsis would discourage diuresis if possible -As needed bronchodilators      Rufina Falco, MSN, DNP, CCRN, FNP-C , AGACNP-BC [facility] Nurse Practitioner  [facility]

## 2020-06-11 NOTE — Evaluation (Signed)
Occupational Therapy Evaluation Patient Details Name: Christy Wade MRN: [id number] DOB: October 30, 1935 Today's Date: 06/11/2020    History of Present Illness Christy Wade is a 84 y.o. female with a past medical history of generalized anxiety disorder, essential hypertension, hypothyroidism, anemia, osteoporosis ,  h/o Rt 7-9 rib fractures and Rt shoulder AC joint seperated shoudler in 9/21  who was brought into the hospital 06/10/20 due to hallucinations , being treated for UTI,In the emergency department patient was  hypothermic with borderline low blood pressures,elevated lactic acid level. SOB overnight 11/24 and placed on @ L O2   Clinical Impression   Christy Wade is an 84 year old woman who exhibits decreased ROM and strength of RUE, generalized weakness, impaired balance, decreased activity tolerance and reports pain in right arm and ribs. Patient reports she lives at home with 24/7 care givers but is typically able to stand and ambulate with RW with min assist and requires some assistance with ADLs. Patient min assist to transfer to side of bed, assistance x 2 to stand due to impaired balance and feet sliding forward and patient unable to take steps. Patient requiring total assistance for toileting and LB dressing and predominantly setup at bed level for UB ADLs. Patient will benefit from skilled OT services while in hospital to improve deficits and learn compensatory strategies as needed in order to return PLOF.  Patient may be able to return home with 24/7 assistance and [facility] if mobility and balance improves. If not, will need short term rehab.    Follow Up Recommendations  SNF;Supervision/Assistance - 24 hour;Home health OT - pending progress   Equipment Recommendations  None recommended by OT    Recommendations for Other Services       Precautions / Restrictions Precautions Precautions: Fall Precaution Comments: R AC separation right rib fxs in september      Mobility  Bed Mobility Overal bed mobility: Needs Assistance Bed Mobility: Supine to Sit;Sit to Supine     Supine to sit: Min assist Sit to supine: Max assist   General bed mobility comments: patient able to move the legs to bed edge, required assistance  for trunk, patient leaning back. noted jerkiness in arms and legs.Assisted back to supine and placed bedpan .    Transfers Overall transfer level: Needs assistance   Transfers: Sit to/from Stand Sit to Stand: +2 safety/equipment;+2 physical assistance         General transfer comment: attempted to stand x 1  , feet sliding forward and patient leaning back, unable to transfer to [facility].    Balance Overall balance assessment: Needs assistance Sitting-balance support: Feet supported;Bilateral upper extremity supported Sitting balance-Leahy Scale: Poor Sitting balance - Comments: trunk lening back Postural control: Posterior lean Standing balance support: Bilateral upper extremity supported;During functional activity Standing balance-Leahy Scale: Poor Standing balance comment: unable                           ADL either performed or assessed with clinical judgement   ADL Overall ADL's : Needs assistance/impaired Eating/Feeding: Set up;Sitting   Grooming: Set up;Sitting   Upper Body Bathing: Minimal assistance;Set up;Sitting   Lower Body Bathing: Sitting/lateral leans;Moderate assistance   Upper Body Dressing : Sitting   Lower Body Dressing: Total assistance;Sit to/from stand;+2 for physical assistance     Toilet Transfer Details (indicate cue type and reason): unable Toileting- Clothing Manipulation and Hygiene: Total assistance;Bed level Toileting - Clothing Manipulation Details (indicate  cue type and reason): required use of bed pan today             Vision   Vision Assessment?: No apparent visual deficits     Perception     Praxis      Pertinent Vitals/Pain Pain Assessment: Faces Faces Pain Scale:  Hurts little more Pain Location: right arm/right side Pain Descriptors / Indicators: Discomfort;Grimacing;Guarding Pain Intervention(s): Monitored during session     Hand Dominance Right   Extremity/Trunk Assessment Upper Extremity Assessment Upper Extremity Assessment: RUE deficits/detail;LUE deficits/detail RUE Deficits / Details: Less than functional shoulder ROM (approx 30 degrees), functional ROM in elbow, wrist and fingers. elbow strength 4/5, wrist 5/5, grip 4/5 RUE Sensation: WNL RUE Coordination: WNL LUE Deficits / Details: WFL ROM and strength LUE Sensation: WNL LUE Coordination: WNL   Lower Extremity Assessment Lower Extremity Assessment: Defer to PT evaluation   Cervical / Trunk Assessment Cervical / Trunk Assessment: Kyphotic   Communication Communication Communication: No difficulties   Cognition Arousal/Alertness: Awake/alert Behavior During Therapy: WFL for tasks assessed/performed Overall Cognitive Status: Impaired/Different from baseline Area of Impairment: Memory                               General Comments: oriented to place and date but perseverated on having an appt today with PCP and will he come here or her family pick her up. Explained  MD's in hospital will take care of her,   General Comments       Exercises     Shoulder Instructions      Home Living Family/patient expects to be discharged to:: Private residence Living Arrangements: Children;Non-relatives/Friends Available Help at Discharge: Personal care attendant;Family;Home health;Available 24 hours/day Type of Home: House Home Access: Stairs to enter [employer] of Steps: 2 steps carport   Home Layout: One level     Bathroom Shower/Tub: Walk-in shower;Tub/shower unit   Bathroom Toilet: Handicapped height     Home Equipment: Clinical cytogeneticist - 2 wheels;Cane - single point;Cane - quad;Grab bars - tub/shower   Additional Comments: pt states she has  hired 24/7 caregivers and family      Prior Functioning/Environment Level of Independence: Needs assistance  Gait / Transfers Assistance Needed: Reports use of walker and caregivers with her to stand (they use gait belt) and perform mobility ADL's / Homemaking Assistance Needed: Assistance for ADLs. reports varying levels of assistance.   Comments: uses Rw and assistance to ambulate.        OT Problem List: Decreased strength;Decreased range of motion;Decreased activity tolerance;Impaired balance (sitting and/or standing);Decreased safety awareness;Decreased knowledge of use of DME or AE;Pain      OT Treatment/Interventions: Self-care/ADL training;Therapeutic exercise;DME and/or AE instruction;Therapeutic activities;Balance training;Patient/family education    OT Goals(Current goals can be found in the care plan section) Acute Rehab OT Goals Patient Stated Goal: go home OT Goal Formulation: With patient Time For Goal Achievement: 06/25/20 Potential to Achieve Goals: Good  OT Frequency: Min 2X/week   Barriers to D/C:            Co-evaluation   Reason for Co-Treatment: For patient/therapist safety (co-eval) PT goals addressed during session: Mobility/safety with mobility OT goals addressed during session: ADL's and self-care      AM-PAC OT "6 Clicks" Daily Activity     Outcome Measure Help from another person eating meals?: A Little Help from another person taking care of personal grooming?: A Little Help from another  person toileting, which includes using toliet, bedpan, or urinal?: Total Help from another person bathing (including washing, rinsing, drying)?: A Lot Help from another person to put on and taking off regular upper body clothing?: A Lot Help from another person to put on and taking off regular lower body clothing?: Total 6 Click Score: 12   End of Session Equipment Utilized During Treatment: Gait belt Nurse Communication: Mobility status  Activity  Tolerance: Patient tolerated treatment well Patient left: in bed;with call bell/phone within reach;with bed alarm set  OT Visit Diagnosis: Unsteadiness on feet (R26.81);Other abnormalities of gait and mobility (R26.89);Pain Pain - Right/Left: Right Pain - part of body: Shoulder                Time: 9643-8381 OT Time Calculation (min): 17 min Charges:  OT General Charges $OT Visit: 1 Visit OT Evaluation $OT Eval Moderate Complexity: 1 Mod  Tadeo Besecker, OTR/L [place]  Office [phone] Pager: [place] 06/11/2020, 12:25 PM

## 2020-06-11 NOTE — Progress Notes (Signed)
PROGRESS NOTE  Christy Wade WLN:[id number] DOB: 1936/04/12 DOA: 06/10/2020 PCP: Luetta Nutting, DO   LOS: 1 day   Brief Narrative / Interim history: 84 year old female with GAD, hypertension, hypothyroidism, anemia, osteoporosis was brought to the hospital due to hallucinations that started over 24 hours prior to admission.  Apparently over the last 4 to 5 months she has been getting recurrent UTIs associated with significant weakness and several falls.  Prior cultures showed Klebsiella in August, E. coli in October, most recently Enterobacter in November.  She was seen in the ED few days ago for which she was prescribed Levaquin, hallucinations started a day afterwards.  She was hypothermic in the ED with borderline low blood pressure, she was found to have elevated lactic acid and admitted to the hospital  Subjective / 24h Interval events: She is doing well this morning, very tangential and not a good historian.  She appears confused.  No significant complaints  Assessment & Plan: Principal Problem Acute metabolic encephalopathy with hallucinations-possibly due to fluoroquinolone.  No focal deficits.  She has received antibiotics but remains persistently confused today.  CT head done a few days ago did not show any acute findings, however given recurrent falls and decline will obtain an MRI of the brain -Cultures have been sent, she has been placed on antibiotics with cefepime, continue  Active Problems Sepsis due to unspecified organism/hypothermia -Possibly due to UTI, chest x-ray also raise possibility for pneumonia.  Continue cefepime and await cultures  Recurrent UTIs -Reason not entirely clear, CT scan of the abdomen without any significant findings to explain recurrent UTIs  Acute hypoxic respiratory failure -Developed hypoxia overnight requiring 2 L this morning.  Possibly due to fluid resuscitation in the setting of sepsis.  Blood pressure is normal this morning, lactic acid  is normalized and will discontinue IV fluids  Abnormal imaging of the right shoulder-x-ray shows large area of cortical lucency in the medial aspect of the right humeral head, unclear if new or chronic.  Apparently Dr. Griffin Basil was aware of this back in August, based on PCP notes it appears that patient is not a surgical candidate.  Outpatient follow-up  Rib fractures on the right-likely due to previous falls, no significant pain.  Possibly causing atelectasis on chest x-ray  Essential hypertension-hold home medications due to hypotension on admission, monitor blood pressure, currently normal  Hypothyroidism-continue Synthroid.  TSH is normal at 3.15  Normocytic anemia- Some drop in hemoglobin noted compared to her baseline which seems to be around 11.  No overt bleeding noted.  Anemia panel with slightly low iron levels  Hyponatremia and hypokalemia -hyponatremia has a chronic component, back in August it was 111.  Thought to be due to volume depletion back then perhaps a component of SIADH.  Nephrology saw her at that time.  Potassium and magnesium to be repleted.  Scheduled Meds: . cycloSPORINE  1 drop Both Eyes BID  . enoxaparin (LOVENOX) injection  40 mg Subcutaneous Q24H  . levothyroxine  88 mcg Oral QAC breakfast  . pantoprazole  40 mg Oral Daily  . potassium chloride  40 mEq Oral Daily  . saccharomyces boulardii  250 mg Oral BID   Continuous Infusions: . ceFEPime (MAXIPIME) IV Stopped (06/11/20 0549)   PRN Meds:.acetaminophen **OR** acetaminophen, hydrALAZINE, ondansetron **OR** ondansetron (ZOFRAN) IV  Diet Orders (From admission, onward)    Start     Ordered   06/10/20 1756  Diet full liquid Room service appropriate? Yes; Fluid consistency: Thin  Diet  effective now       Question Answer Comment  Room service appropriate? Yes   Fluid consistency: Thin      06/10/20 1758          DVT prophylaxis: enoxaparin (LOVENOX) injection 40 mg Start: 06/10/20 2200     Code  Status: DNR  Family Communication: no family at bedside, will talk with daughter later   Status is: Inpatient  Remains inpatient appropriate because:Altered mental status   Dispo: The patient is from: Home              Anticipated d/c is to: SNF              Anticipated d/c date is: 2 days              Patient currently is not medically stable to d/c.  Consultants:  None   Procedures:  None   Microbiology  Cultures pending   Antimicrobials: Cefepime 11/23 >>    Objective: Vitals:   06/10/20 2000 06/10/20 2038 06/11/20 0018 06/11/20 0443  BP: (!) 148/70 (!) 144/70 117/66 140/89  Pulse: 90 90 79 90  Resp: (!) 23 (!) 22 20 20   Temp:  98.4 F (36.9 C) 98.3 F (36.8 C) 99.2 F (37.3 C)  TempSrc:  Oral  Oral  SpO2: 97% 96% 91% 91%  Weight:  57.8 kg    Height:  5\' 7"  (1.702 m)      Intake/Output Summary (Last 24 hours) at 06/11/2020 1058 Last data filed at 06/10/2020 2328 Gross per 24 hour  Intake 1509.79 ml  Output 300 ml  Net 1209.79 ml   Filed Weights   06/10/20 2038  Weight: 57.8 kg    Examination:  Constitutional: NAD Eyes: no scleral icterus ENMT: Mucous membranes are moist.  Neck: normal, supple Respiratory: clear to auscultation bilaterally, no wheezing, no crackles.  Cardiovascular: Regular rate and rhythm, no murmurs / rubs / gallops. No LE edema. Good peripheral pulses Abdomen: non distended, no tenderness. Bowel sounds positive.  Musculoskeletal: no clubbing / cyanosis.  Skin: no rashes Neurologic: no focal deficits   Data Reviewed: I have independently reviewed following labs and imaging studies   CBC: Recent Labs  Lab 06/06/20 1450 06/10/20 1234 06/11/20 0507  WBC 7.8 5.5 7.5  NEUTROABS 5.2 4.1  --   HGB 10.6* 9.3* 8.4*  HCT 32.3* 28.9* 27.2*  MCV 90.5 91.2 95.1  PLT 393 325 211   Basic Metabolic Panel: Recent Labs  Lab 06/06/20 1450 06/10/20 1234 06/11/20 0507  NA 132* 133* 133*  K 3.9 3.0* 4.0  CL 92* 94* 99  CO2 26  30 24   GLUCOSE 90 107* 92  BUN 12 14 13   CREATININE 0.99 0.86 0.81  CALCIUM 8.5* 8.5* 8.1*  MG  --  1.8 1.7   Liver Function Tests: Recent Labs  Lab 06/06/20 1450 06/10/20 1234 06/11/20 0507  AST 27 26 20   ALT 18 18 17   ALKPHOS 96 81 75  BILITOT 0.8 0.8 0.5  PROT 7.0 6.2* 5.5*  ALBUMIN 3.2* 2.9* 2.5*   Coagulation Profile: Recent Labs  Lab 06/10/20 1234 06/11/20 0507  INR 1.2 1.3*   HbA1C: No results for input(s): HGBA1C in the last 72 hours. CBG: No results for input(s): GLUCAP in the last 168 hours.  Recent Results (from the past 240 hour(s))  Urine Culture     Status: Abnormal   Collection Time: 06/02/20  2:55 PM   Specimen: Urine  Result Value Ref Range  Status   MICRO NUMBER: [id number]  Final   SPECIMEN QUALITY: Adequate  Final   Sample Source NOT GIVEN  Final   STATUS: FINAL  Final   ISOLATE 1: Enterobacter aerogenes (A)  Final    Comment: Greater than 100,000 CFU/mL of Klebsiella aerogenes (Enterobacter)      Susceptibility   Enterobacter aerogenes - URINE CULTURE, REFLEX    AMOX/CLAVULANIC >=32 Resistant     CEFAZOLIN* >=64 Resistant      * For uncomplicated UTI caused by E. coli,K. pneumoniae or P. mirabilis: Cefazolin issusceptible if MIC <32 mcg/mL and predictssusceptible to the oral agents cefaclor, cefdinir,cefpodoxime, cefprozil, cefuroxime, cephalexinand loracarbef.    CEFEPIME <=1 Sensitive     CEFTRIAXONE >=64 Resistant     CIPROFLOXACIN <=0.25 Sensitive     LEVOFLOXACIN <=0.12 Sensitive     ERTAPENEM <=0.5 Sensitive     GENTAMICIN <=1 Sensitive     IMIPENEM 0.5 Sensitive     NITROFURANTOIN 64 Intermediate     PIP/TAZO >=128 Resistant     TOBRAMYCIN <=1 Sensitive     TRIMETH/SULFA* <=20 Sensitive      * For uncomplicated UTI caused by E. coli,K. pneumoniae or P. mirabilis: Cefazolin issusceptible if MIC <32 mcg/mL and predictssusceptible to the oral agents cefaclor, cefdinir,cefpodoxime, cefprozil, cefuroxime, cephalexinand loracarbef.Legend:S  = Susceptible  I = IntermediateR = Resistant  NS = Not susceptible* = Not tested  NR = Not reported**NN = See antimicrobic comments  Blood Culture (routine x 2)     Status: None   Collection Time: 06/06/20  2:52 PM   Specimen: BLOOD  Result Value Ref Range Status   Specimen Description   Final    BLOOD RIGHT ANTECUBITAL Performed at [facility] [address], [place], [place] [address]    Special Requests   Final    BOTTLES DRAWN AEROBIC AND ANAEROBIC Blood Culture adequate volume Performed at [facility] [address] [place], [place] [address]    Culture   Final    NO GROWTH 5 DAYS Performed at [facility], [place] [address] [place], [place] [address]    Report Status 06/11/2020 FINAL  Final  Blood Culture (routine x 2)     Status: None   Collection Time: 06/06/20  2:52 PM   Specimen: BLOOD  Result Value Ref Range Status   Specimen Description   Final    BLOOD LEFT ANTECUBITAL Performed at [facility] [address] [place], [place] [address]    Special Requests   Final    BOTTLES DRAWN AEROBIC AND ANAEROBIC Blood Culture adequate volume Performed at [facility] [address], [place], [place] [address]    Culture   Final    NO GROWTH 5 DAYS Performed at [facility], [place] [address] [place], [place] [address]    Report Status 06/11/2020 FINAL  Final  Urine culture     Status: Abnormal   Collection Time: 06/06/20  4:47 PM   Specimen: In/Out Cath Urine  Result Value Ref Range Status   Specimen Description   Final    IN/OUT CATH URINE Performed at [facility] [address], [place], [place] [address]    Special Requests   Final    NONE Performed at [facility], [place] [address], [place], [place] [address]    Culture 20,000 COLONIES/mL STAPHYLOCOCCUS HOMINIS (A)  Final   Report Status 06/09/2020 FINAL  Final  Organism ID, Bacteria  STAPHYLOCOCCUS HOMINIS (A)  Final      Susceptibility   Staphylococcus hominis - MIC*    CIPROFLOXACIN >=8 RESISTANT Resistant     GENTAMICIN <=0.5 SENSITIVE Sensitive     NITROFURANTOIN <=16 SENSITIVE Sensitive     OXACILLIN >=4 RESISTANT Resistant     TETRACYCLINE >=16 RESISTANT Resistant     VANCOMYCIN 2 SENSITIVE Sensitive     TRIMETH/SULFA 40 SENSITIVE Sensitive     CLINDAMYCIN >=8 RESISTANT Resistant     RIFAMPIN <=0.5 SENSITIVE Sensitive     Inducible Clindamycin NEGATIVE Sensitive     * 20,000 COLONIES/mL STAPHYLOCOCCUS HOMINIS  Culture, blood (routine x 2)     Status: None (Preliminary result)   Collection Time: 06/10/20 12:34 PM   Specimen: BLOOD  Result Value Ref Range Status   Specimen Description   Final    BLOOD LEFT WRIST Performed at [facility] [address] [place], [place] [address]    Special Requests   Final    BOTTLES DRAWN AEROBIC AND ANAEROBIC Blood Culture adequate volume Performed at [facility] [address] [place], [place] [address]    Culture   Final    NO GROWTH < 24 HOURS Performed at [facility] [address], [place], [place] [address]    Report Status PENDING  Incomplete  Culture, blood (routine x 2)     Status: None (Preliminary result)   Collection Time: 06/10/20 12:34 PM   Specimen: BLOOD  Result Value Ref Range Status   Specimen Description   Final    BLOOD RIGHT ANTECUBITAL Performed at [facility] [address], , [place] [address]    Special Requests   Final    BOTTLES DRAWN AEROBIC AND ANAEROBIC Blood Culture results may not be optimal due to an excessive volume of blood received in culture bottles Performed at [facility] [address] [place], [place] [address]    Culture   Final    NO GROWTH < 24 HOURS Performed at [facility] [address] [place],  [address]    Report Status PENDING  Incomplete  Resp  Panel by RT-PCR (Flu A&B, Covid) Nasopharyngeal Swab     Status: None   Collection Time: 06/10/20  4:57 PM   Specimen: Nasopharyngeal Swab; Nasopharyngeal(NP) swabs in vial transport medium  Result Value Ref Range Status   SARS Coronavirus 2 by RT PCR NEGATIVE NEGATIVE Final    Comment: (NOTE) SARS-CoV-2 target nucleic acids are NOT DETECTED.  The SARS-CoV-2 RNA is generally detectable in upper respiratory specimens during the acute phase of infection. The lowest concentration of SARS-CoV-2 viral copies this assay can detect is 138 copies/mL. A negative result does not preclude SARS-Cov-2 infection and should not be used as the sole basis for treatment or other patient management decisions. A negative result may occur with  improper specimen collection/handling, submission of specimen other than nasopharyngeal swab, presence of viral mutation(s) within the areas targeted by this assay, and inadequate number of viral copies(<138 copies/mL). A negative result must be combined with clinical observations, patient history, and epidemiological information. The expected result is Negative.  Fact Sheet for Patients:  EntrepreneurPulse.com.au  Fact Sheet for Healthcare Providers:  IncredibleEmployment.be  This test is no t yet approved or cleared by the [place] FDA and  has been authorized for detection and/or diagnosis of SARS-CoV-2 by FDA under an Emergency Use Authorization (EUA). This EUA  will remain  in effect (meaning this test can be used) for the duration of the COVID-19 declaration under Section 564(b)(1) of the Act, 21 U.S.C.section 360bbb-3(b)(1), unless the authorization is terminated  or revoked sooner.       Influenza A by PCR NEGATIVE NEGATIVE Final   Influenza B by PCR NEGATIVE NEGATIVE Final    Comment: (NOTE) The Xpert Xpress SARS-CoV-2/FLU/RSV plus assay is intended as an aid in the diagnosis of influenza from Nasopharyngeal  swab specimens and should not be used as a sole basis for treatment. Nasal washings and aspirates are unacceptable for Xpert Xpress SARS-CoV-2/FLU/RSV testing.  Fact Sheet for Patients: EntrepreneurPulse.com.au  Fact Sheet for Healthcare Providers: IncredibleEmployment.be  This test is not yet approved or cleared by the [place] FDA and has been authorized for detection and/or diagnosis of SARS-CoV-2 by FDA under an Emergency Use Authorization (EUA). This EUA will remain in effect (meaning this test can be used) for the duration of the COVID-19 declaration under Section 564(b)(1) of the Act, 21 U.S.C. section 360bbb-3(b)(1), unless the authorization is terminated or revoked.  Performed at [facility], [place] [address] [place], [place] [address]      Radiology Studies: DG Chest 1 View  Result Date: 06/11/2020 CLINICAL DATA:  New shortness of breath today. EXAM: CHEST  1 VIEW COMPARISON:  One-view chest x-ray 06/10/2020 FINDINGS: Heart is enlarged. Right pleural effusion and basilar airspace disease is again seen. Aeration of the left lung is improved. Right-sided rib fractures are again noted. Fracture of the coracoid process demonstrates interval healing. IMPRESSION: 1. Stable right pleural effusion and basilar airspace disease. 2. Improved aeration of the left lung. 3. Healing right coracoid process fracture. Electronically Signed   By: San Morelle M.D.   On: 06/11/2020 06:46   DG Chest Port 1 View  Result Date: 06/10/2020 CLINICAL DATA:  Questionable sepsis, UTI on antibiotics, having hallucinations, unsteady on feet, increased falls, hypertension EXAM: PORTABLE CHEST 1 VIEW COMPARISON:  Portable exam 1307 hours compared to 06/06/2020 FINDINGS: Enlargement of cardiac silhouette. Rotated to the RIGHT with kyphotic positioning. Mediastinal contours and pulmonary vascularity normal. Atelectasis and questionable  infiltrate at RIGHT base with associated RIGHT pleural effusion. Minimal subsegmental atelectasis LEFT base. Remaining lungs clear. No pneumothorax. Bones demineralized. IMPRESSION: Atelectasis and small pleural effusion at RIGHT base with questionable associated RIGHT basilar infiltrate. Minimal subsegmental atelectasis LEFT base. Electronically Signed   By: Lavonia Dana M.D.   On: 06/10/2020 13:14   CT RENAL STONE STUDY  Result Date: 06/10/2020 CLINICAL DATA:  Frequent UTI EXAM: CT ABDOMEN AND PELVIS WITHOUT CONTRAST TECHNIQUE: Multidetector CT imaging of the abdomen and pelvis was performed following the standard protocol without IV contrast. COMPARISON:  2016 FINDINGS: Lower chest: Cardiomegaly. Partially imaged right greater than left pleural effusions with adjacent atelectasis/consolidation. Hepatobiliary: No focal liver abnormality is seen. No gallstones, gallbladder wall thickening, or biliary dilatation. Pancreas: Unremarkable. Spleen: Unremarkable. Adrenals/Urinary Tract: Small cyst of the upper pole of the right kidney. No renal calculi hydronephrosis. There are no ureteral calculi identified. The bladder is poorly distended. Stomach/Bowel: Stomach is within normal limits apart from possible small hiatal hernia. Bowel is normal in caliber. There is an anastomosis in the right lower quadrant. Vascular/Lymphatic: Aortic atherosclerosis. No enlarged lymph nodes identified. Reproductive: Status post hysterectomy. No adnexal masses. Other: No ascites.  Mild body wall edema. Musculoskeletal: Degenerative changes of the included spine particularly at L4-L5 and L5-S1. No acute osseous abnormality. IMPRESSION: No urinary tract calculi. Pyelonephritis  is not well evaluated on this noncontrast study. Partially imaged right greater than left pleural effusions with adjacent atelectasis/consolidation. Cardiomegaly. Electronically Signed   By: Macy Mis M.D.   On: 06/10/2020 19:48     Time spent: 35  minutes   Marzetta Board, MD, PhD [facility]  Between 7 am - 7 pm I am available, please contact me via Amion or Securechat  Between 7 pm - 7 am I am not available, please contact night coverage MD/APP via Amion

## 2020-06-11 NOTE — Progress Notes (Signed)
Attempted patient for MRI, she is unable to lay flat enough to fit into the head coil. Second attempt to lay her in position to use flex coil instead and she still cannot lay flat enough.

## 2020-06-12 ENCOUNTER — Inpatient Hospital Stay (HOSPITAL_COMMUNITY): Payer: Medicare PPO

## 2020-06-12 ENCOUNTER — Encounter (HOSPITAL_COMMUNITY): Payer: Self-pay | Admitting: Internal Medicine

## 2020-06-12 DIAGNOSIS — R9431 Abnormal electrocardiogram [ECG] [EKG]: Secondary | ICD-10-CM | POA: Diagnosis not present

## 2020-06-12 DIAGNOSIS — R443 Hallucinations, unspecified: Secondary | ICD-10-CM | POA: Diagnosis not present

## 2020-06-12 DIAGNOSIS — I361 Nonrheumatic tricuspid (valve) insufficiency: Secondary | ICD-10-CM

## 2020-06-12 DIAGNOSIS — E871 Hypo-osmolality and hyponatremia: Secondary | ICD-10-CM | POA: Diagnosis not present

## 2020-06-12 DIAGNOSIS — I340 Nonrheumatic mitral (valve) insufficiency: Secondary | ICD-10-CM

## 2020-06-12 DIAGNOSIS — I351 Nonrheumatic aortic (valve) insufficiency: Secondary | ICD-10-CM

## 2020-06-12 DIAGNOSIS — F411 Generalized anxiety disorder: Secondary | ICD-10-CM | POA: Diagnosis not present

## 2020-06-12 DIAGNOSIS — I10 Essential (primary) hypertension: Secondary | ICD-10-CM | POA: Diagnosis not present

## 2020-06-12 LAB — BLOOD GAS, ARTERIAL
Acid-Base Excess: 5.9 mmol/L — ABNORMAL HIGH (ref 0.0–2.0)
Bicarbonate: 30.2 mmol/L — ABNORMAL HIGH (ref 20.0–28.0)
O2 Saturation: 97.4 %
Patient temperature: 98.6
pCO2 arterial: 45 mmHg (ref 32.0–48.0)
pH, Arterial: 7.442 (ref 7.350–7.450)
pO2, Arterial: 99.4 mmHg (ref 83.0–108.0)

## 2020-06-12 LAB — ECHOCARDIOGRAM COMPLETE
AR max vel: 1.23 cm2
AV Area VTI: 1.35 cm2
AV Area mean vel: 1.35 cm2
AV Mean grad: 6 mmHg
AV Peak grad: 12.3 mmHg
Ao pk vel: 1.75 m/s
Area-P 1/2: 4.86 cm2
Height: 67 in
P 1/2 time: 374 msec
Weight: 2038.81 oz

## 2020-06-12 LAB — CBC
HCT: 25.6 % — ABNORMAL LOW (ref 36.0–46.0)
Hemoglobin: 8.1 g/dL — ABNORMAL LOW (ref 12.0–15.0)
MCH: 29.5 pg (ref 26.0–34.0)
MCHC: 31.6 g/dL (ref 30.0–36.0)
MCV: 93.1 fL (ref 80.0–100.0)
Platelets: 264 10*3/uL (ref 150–400)
RBC: 2.75 MIL/uL — ABNORMAL LOW (ref 3.87–5.11)
RDW: 15.6 % — ABNORMAL HIGH (ref 11.5–15.5)
WBC: 6.5 10*3/uL (ref 4.0–10.5)
nRBC: 0 % (ref 0.0–0.2)

## 2020-06-12 LAB — COMPREHENSIVE METABOLIC PANEL
ALT: 16 U/L (ref 0–44)
AST: 19 U/L (ref 15–41)
Albumin: 2.3 g/dL — ABNORMAL LOW (ref 3.5–5.0)
Alkaline Phosphatase: 70 U/L (ref 38–126)
Anion gap: 6 (ref 5–15)
BUN: 11 mg/dL (ref 8–23)
CO2: 28 mmol/L (ref 22–32)
Calcium: 7.6 mg/dL — ABNORMAL LOW (ref 8.9–10.3)
Chloride: 99 mmol/L (ref 98–111)
Creatinine, Ser: 0.88 mg/dL (ref 0.44–1.00)
GFR, Estimated: >60 mL/min (ref >60)
Glucose, Bld: 95 mg/dL (ref 70–99)
Potassium: 3.3 mmol/L — ABNORMAL LOW (ref 3.5–5.1)
Sodium: 133 mmol/L — ABNORMAL LOW (ref 135–145)
Total Bilirubin: 0.5 mg/dL (ref 0.3–1.2)
Total Protein: 5.4 g/dL — ABNORMAL LOW (ref 6.5–8.1)

## 2020-06-12 LAB — URINE CULTURE: Culture: NO GROWTH

## 2020-06-12 MED ORDER — POTASSIUM CHLORIDE 20 MEQ/15ML (10%) PO SOLN
40.0000 meq | Freq: Once | ORAL | Status: DC
Start: 2020-06-12 — End: 2020-06-12

## 2020-06-12 MED ORDER — IOHEXOL 350 MG/ML SOLN
100.0000 mL | Freq: Once | INTRAVENOUS | Status: AC | PRN
  Administered 2020-06-12: 100 mL via INTRAVENOUS

## 2020-06-12 MED ORDER — FUROSEMIDE 10 MG/ML IJ SOLN
40.0000 mg | Freq: Every day | INTRAMUSCULAR | Status: DC
  Administered 2020-06-12 – 2020-06-15 (×4): 40 mg via INTRAVENOUS
  Filled 2020-06-12 (×5): qty 4

## 2020-06-12 MED ORDER — PERFLUTREN LIPID MICROSPHERE
1.0000 mL | INTRAVENOUS | Status: AC | PRN
  Administered 2020-06-12: 2 mL via INTRAVENOUS
  Filled 2020-06-12: qty 10

## 2020-06-12 NOTE — Progress Notes (Addendum)
Pt has returned from MRI. Pt was set up to eat her dinner.  Jerene Pitch

## 2020-06-12 NOTE — Progress Notes (Signed)
Was able to get pt to wake up to eat her lunch and drink an ensure. Pt was more alert and vocal. Pt asked for some juice and began to watch television. Pt is now on the way down for her MRI. Jerene Pitch

## 2020-06-12 NOTE — Progress Notes (Signed)
Echocardiogram 2D Echocardiogram with 3D and definity has been performed.  Darlina Sicilian M 06/12/2020, 11:04 AM

## 2020-06-12 NOTE — Plan of Care (Signed)
  Problem: Clinical Measurements: Goal: Ability to maintain clinical measurements within normal limits will improve Outcome: Progressing Goal: Will remain free from infection Outcome: Progressing Goal: Diagnostic test results will improve Outcome: Progressing Goal: Respiratory complications will improve Outcome: Progressing Goal: Cardiovascular complication will be avoided Outcome: Progressing   Problem: Activity: Goal: Risk for activity intolerance will decrease Outcome: Progressing   Problem: Nutrition: Goal: Adequate nutrition will be maintained Outcome: Progressing   Problem: Coping: Goal: Level of anxiety will decrease Outcome: Progressing   Problem: Elimination: Goal: Will not experience complications related to bowel motility Outcome: Progressing Goal: Will not experience complications related to urinary retention Outcome: Progressing   Problem: Pain Managment: Goal: General experience of comfort will improve Outcome: Progressing   Problem: Safety: Goal: Ability to remain free from injury will improve Outcome: Progressing   Problem: Skin Integrity: Goal: Risk for impaired skin integrity will decrease Outcome: Progressing   Problem: Fluid Volume: Goal: Hemodynamic stability will improve Outcome: Progressing

## 2020-06-12 NOTE — Progress Notes (Signed)
PROGRESS NOTE  Christy Wade CVE:[id number] DOB: 1936/03/28 DOA: 06/10/2020 PCP: Luetta Nutting, DO   LOS: 2 days   Brief Narrative / Interim history: 84 year old female with GAD, hypertension, hypothyroidism, anemia, osteoporosis was brought to the hospital due to hallucinations that started over 24 hours prior to admission.  Apparently over the last 4 to 5 months she has been getting recurrent UTIs associated with significant weakness and several falls.  Prior cultures showed Klebsiella in August, E. coli in October, most recently Enterobacter in November.  She was seen in the ED few days ago for which she was prescribed Levaquin, hallucinations started a day afterwards.  She was hypothermic in the ED with borderline low blood pressure, she was found to have elevated lactic acid and admitted to the hospital  Subjective / 24h Interval events: Appears sleepy this morning but wakes up easy. Denies further hallucinations. More short of breath last night.   Assessment & Plan: Principal Problem Acute metabolic encephalopathy with hallucinations-possibly due to fluoroquinolone.  No focal deficits. Still not back to baseline per daughter.  CT head done a few days ago did not show any acute findings, however given recurrent falls and decline will obtain an MRI of the brain which is still pending  -Cultures have been sent, she has been placed on antibiotics with cefepime, continue.  Cultures remain negative  Active Problems Sepsis due to unspecified organism/hypothermia -Possibly due to UTI, chest x-ray also raise possibility for pneumonia.  Continue cefepime and await cultures  Recurrent UTIs -Reason not entirely clear, CT scan of the abdomen without any significant findings to explain recurrent UTIs  Acute hypoxic respiratory failure -Hypoxia slightly worse this morning. She did receive IV Lasix yesterday. D dimer is elevated, given history of poor mobility will obtain CTA to exclude a PE.    Abnormal imaging of the right shoulder-x-ray shows large area of cortical lucency in the medial aspect of the right humeral head, unclear if new or chronic.  Apparently Dr. Griffin Basil was aware of this back in August, based on PCP notes it appears that patient is not a surgical candidate.  Outpatient follow-up  Rib fractures on the right-likely due to previous falls, no significant pain.  Possibly causing atelectasis on chest x-ray  Essential hypertension-hold home medications due to hypotension on admission, monitor blood pressure, currently normal  Hypothyroidism-continue Synthroid.  TSH is normal at 3.15  Normocytic anemia- Some drop in hemoglobin noted compared to her baseline which seems to be around 11.  No overt bleeding noted.  Anemia panel with slightly low iron levels  Hyponatremia and hypokalemia -hyponatremia has a chronic component, back in August it was 111.  Thought to be due to volume depletion back then perhaps a component of SIADH.  Sodium has remained stable. Replete potassium  Scheduled Meds: . cycloSPORINE  1 drop Both Eyes BID  . enoxaparin (LOVENOX) injection  40 mg Subcutaneous Q24H  . feeding supplement  237 mL Oral BID BM  . influenza vaccine adjuvanted  0.5 mL Intramuscular Tomorrow-1000  . levothyroxine  88 mcg Oral QAC breakfast  . multivitamin with minerals  1 tablet Oral Daily  . pantoprazole  40 mg Oral Daily  . potassium chloride  40 mEq Oral Daily  . saccharomyces boulardii  250 mg Oral BID   Continuous Infusions: . ceFEPime (MAXIPIME) IV 2 g (06/12/20 0212)   PRN Meds:.acetaminophen **OR** acetaminophen, hydrALAZINE, ondansetron **OR** ondansetron (ZOFRAN) IV  Diet Orders (From admission, onward)    Start  Ordered   06/10/20 1756  Diet full liquid Room service appropriate? Yes; Fluid consistency: Thin  Diet effective now       Question Answer Comment  Room service appropriate? Yes   Fluid consistency: Thin      06/10/20 1758          DVT  prophylaxis: enoxaparin (LOVENOX) injection 40 mg Start: 06/10/20 2200     Code Status: DNR  Family Communication: Daughter over the phone  Status is: Inpatient  Remains inpatient appropriate because:Altered mental status   Dispo: The patient is from: Home              Anticipated d/c is to: SNF              Anticipated d/c date is: 2 days              Patient currently is not medically stable to d/c.  Consultants:  None   Procedures:  None   Microbiology  Cultures pending   Antimicrobials: Cefepime 11/23 >>    Objective: Vitals:   06/12/20 0600 06/12/20 0621 06/12/20 0822 06/12/20 0929  BP:  127/83 121/61 (!) 108/55  Pulse:  81 73 68  Resp: (!) 26 (!) 25 18 19   Temp:  98.4 F (36.9 C) 98.7 F (37.1 C) 98.9 F (37.2 C)  TempSrc:  Axillary Oral Oral  SpO2:  98% 100% 100%  Weight:      Height:        Intake/Output Summary (Last 24 hours) at 06/12/2020 0936 Last data filed at 06/12/2020 0600 Gross per 24 hour  Intake 746 ml  Output 150 ml  Net 596 ml   Filed Weights   06/10/20 2038  Weight: 57.8 kg    Examination:  Constitutional: no distress  Eyes: no icterus  ENMT: mmm Neck: normal, supple Respiratory: CTA biL, no wheezing  Cardiovascular: RRR, no murmurs, no edema Abdomen: soft, NT, ND, BS+ Musculoskeletal: no clubbing / cyanosis.  Skin: no rashes  Neurologic: non focal    Data Reviewed: I have independently reviewed following labs and imaging studies   CBC: Recent Labs  Lab 06/06/20 1450 06/10/20 1234 06/11/20 0507 06/12/20 0409  WBC 7.8 5.5 7.5 6.5  NEUTROABS 5.2 4.1  --   --   HGB 10.6* 9.3* 8.4* 8.1*  HCT 32.3* 28.9* 27.2* 25.6*  MCV 90.5 91.2 95.1 93.1  PLT 393 325 291 024   Basic Metabolic Panel: Recent Labs  Lab 06/06/20 1450 06/10/20 1234 06/11/20 0507 06/12/20 0409  NA 132* 133* 133* 133*  K 3.9 3.0* 4.0 3.3*  CL 92* 94* 99 99  CO2 26 30 24 28   GLUCOSE 90 107* 92 95  BUN 12 14 13 11   CREATININE 0.99 0.86  0.81 0.88  CALCIUM 8.5* 8.5* 8.1* 7.6*  MG  --  1.8 1.7  --    Liver Function Tests: Recent Labs  Lab 06/06/20 1450 06/10/20 1234 06/11/20 0507 06/12/20 0409  AST 27 26 20 19   ALT 18 18 17 16   ALKPHOS 96 81 75 70  BILITOT 0.8 0.8 0.5 0.5  PROT 7.0 6.2* 5.5* 5.4*  ALBUMIN 3.2* 2.9* 2.5* 2.3*   Coagulation Profile: Recent Labs  Lab 06/10/20 1234 06/11/20 0507  INR 1.2 1.3*   HbA1C: No results for input(s): HGBA1C in the last 72 hours. CBG: No results for input(s): GLUCAP in the last 168 hours.  Recent Results (from the past 240 hour(s))  Urine Culture     Status:  Abnormal   Collection Time: 06/02/20  2:55 PM   Specimen: Urine  Result Value Ref Range Status   MICRO NUMBER: [id number]  Final   SPECIMEN QUALITY: Adequate  Final   Sample Source NOT GIVEN  Final   STATUS: FINAL  Final   ISOLATE 1: Enterobacter aerogenes (A)  Final    Comment: Greater than 100,000 CFU/mL of Klebsiella aerogenes (Enterobacter)      Susceptibility   Enterobacter aerogenes - URINE CULTURE, REFLEX    AMOX/CLAVULANIC >=32 Resistant     CEFAZOLIN* >=64 Resistant      * For uncomplicated UTI caused by E. coli,K. pneumoniae or P. mirabilis: Cefazolin issusceptible if MIC <32 mcg/mL and predictssusceptible to the oral agents cefaclor, cefdinir,cefpodoxime, cefprozil, cefuroxime, cephalexinand loracarbef.    CEFEPIME <=1 Sensitive     CEFTRIAXONE >=64 Resistant     CIPROFLOXACIN <=0.25 Sensitive     LEVOFLOXACIN <=0.12 Sensitive     ERTAPENEM <=0.5 Sensitive     GENTAMICIN <=1 Sensitive     IMIPENEM 0.5 Sensitive     NITROFURANTOIN 64 Intermediate     PIP/TAZO >=128 Resistant     TOBRAMYCIN <=1 Sensitive     TRIMETH/SULFA* <=20 Sensitive      * For uncomplicated UTI caused by E. coli,K. pneumoniae or P. mirabilis: Cefazolin issusceptible if MIC <32 mcg/mL and predictssusceptible to the oral agents cefaclor, cefdinir,cefpodoxime, cefprozil, cefuroxime, cephalexinand loracarbef.Legend:S =  Susceptible  I = IntermediateR = Resistant  NS = Not susceptible* = Not tested  NR = Not reported**NN = See antimicrobic comments  Blood Culture (routine x 2)     Status: None   Collection Time: 06/06/20  2:52 PM   Specimen: BLOOD  Result Value Ref Range Status   Specimen Description   Final    BLOOD RIGHT ANTECUBITAL Performed at [facility] [address] [place], [place] [address]    Special Requests   Final    BOTTLES DRAWN AEROBIC AND ANAEROBIC Blood Culture adequate volume Performed at [facility] [address], [place], [place] [address]    Culture   Final    NO GROWTH 5 DAYS Performed at [facility], [place] [address] [place], [place] [address]    Report Status 06/11/2020 FINAL  Final  Blood Culture (routine x 2)     Status: None   Collection Time: 06/06/20  2:52 PM   Specimen: BLOOD  Result Value Ref Range Status   Specimen Description   Final    BLOOD LEFT ANTECUBITAL Performed at [facility] [address] [place], [place] [address]    Special Requests   Final    BOTTLES DRAWN AEROBIC AND ANAEROBIC Blood Culture adequate volume Performed at [facility] [address] [place], [place] [address]    Culture   Final    NO GROWTH 5 DAYS Performed at [facility], [place] [address], [place], [place] [address]    Report Status 06/11/2020 FINAL  Final  Urine culture     Status: Abnormal   Collection Time: 06/06/20  4:47 PM   Specimen: In/Out Cath Urine  Result Value Ref Range Status   Specimen Description   Final    IN/OUT CATH URINE Performed at [facility] [address], [place], [place] [address]    Special Requests   Final    NONE Performed at [facility], [place] [address] [place], [place] [address]  Culture 20,000 COLONIES/mL STAPHYLOCOCCUS HOMINIS (A)  Final   Report Status 06/09/2020 FINAL  Final   Organism ID, Bacteria  STAPHYLOCOCCUS HOMINIS (A)  Final      Susceptibility   Staphylococcus hominis - MIC*    CIPROFLOXACIN >=8 RESISTANT Resistant     GENTAMICIN <=0.5 SENSITIVE Sensitive     NITROFURANTOIN <=16 SENSITIVE Sensitive     OXACILLIN >=4 RESISTANT Resistant     TETRACYCLINE >=16 RESISTANT Resistant     VANCOMYCIN 2 SENSITIVE Sensitive     TRIMETH/SULFA 40 SENSITIVE Sensitive     CLINDAMYCIN >=8 RESISTANT Resistant     RIFAMPIN <=0.5 SENSITIVE Sensitive     Inducible Clindamycin NEGATIVE Sensitive     * 20,000 COLONIES/mL STAPHYLOCOCCUS HOMINIS  Culture, blood (routine x 2)     Status: None (Preliminary result)   Collection Time: 06/10/20 12:34 PM   Specimen: BLOOD  Result Value Ref Range Status   Specimen Description   Final    BLOOD LEFT WRIST Performed at [facility] [address], [place], [place] [address]    Special Requests   Final    BOTTLES DRAWN AEROBIC AND ANAEROBIC Blood Culture adequate volume Performed at [facility] [address], [place], [place] [address]    Culture   Final    NO GROWTH 2 DAYS Performed at [facility] [address] [place], [place] [address]    Report Status PENDING  Incomplete  Culture, blood (routine x 2)     Status: None (Preliminary result)   Collection Time: 06/10/20 12:34 PM   Specimen: BLOOD  Result Value Ref Range Status   Specimen Description   Final    BLOOD RIGHT ANTECUBITAL Performed at [facility] [address] [place], [place] [address]    Special Requests   Final    BOTTLES DRAWN AEROBIC AND ANAEROBIC Blood Culture results may not be optimal due to an excessive volume of blood received in culture bottles Performed at [facility] [address], [place], [place] [address]    Culture   Final    NO GROWTH 2 DAYS Performed at [facility] [address] [place], [place] [address]    Report Status PENDING  Incomplete  Resp Panel  by RT-PCR (Flu A&B, Covid) Nasopharyngeal Swab     Status: None   Collection Time: 06/10/20  4:57 PM   Specimen: Nasopharyngeal Swab; Nasopharyngeal(NP) swabs in vial transport medium  Result Value Ref Range Status   SARS Coronavirus 2 by RT PCR NEGATIVE NEGATIVE Final    Comment: (NOTE) SARS-CoV-2 target nucleic acids are NOT DETECTED.  The SARS-CoV-2 RNA is generally detectable in upper respiratory specimens during the acute phase of infection. The lowest concentration of SARS-CoV-2 viral copies this assay can detect is 138 copies/mL. A negative result does not preclude SARS-Cov-2 infection and should not be used as the sole basis for treatment or other patient management decisions. A negative result may occur with  improper specimen collection/handling, submission of specimen other than nasopharyngeal swab, presence of viral mutation(s) within the areas targeted by this assay, and inadequate number of viral copies(<138 copies/mL). A negative result must be combined with clinical observations, patient history, and epidemiological information. The expected result is Negative.  Fact Sheet for Patients:  EntrepreneurPulse.com.au  Fact Sheet for Healthcare Providers:  IncredibleEmployment.be  This test is no t yet approved or cleared by the [place] and  has been authorized  for detection and/or diagnosis of SARS-CoV-2 by FDA under an Emergency Use Authorization (EUA). This EUA will remain  in effect (meaning this test can be used) for the duration of the COVID-19 declaration under Section 564(b)(1) of the Act, 21 U.S.C.section 360bbb-3(b)(1), unless the authorization is terminated  or revoked sooner.       Influenza A by PCR NEGATIVE NEGATIVE Final   Influenza B by PCR NEGATIVE NEGATIVE Final    Comment: (NOTE) The Xpert Xpress SARS-CoV-2/FLU/RSV plus assay is intended as an aid in the diagnosis of influenza from Nasopharyngeal swab  specimens and should not be used as a sole basis for treatment. Nasal washings and aspirates are unacceptable for Xpert Xpress SARS-CoV-2/FLU/RSV testing.  Fact Sheet for Patients: EntrepreneurPulse.com.au  Fact Sheet for Healthcare Providers: IncredibleEmployment.be  This test is not yet approved or cleared by the [place] FDA and has been authorized for detection and/or diagnosis of SARS-CoV-2 by FDA under an Emergency Use Authorization (EUA). This EUA will remain in effect (meaning this test can be used) for the duration of the COVID-19 declaration under Section 564(b)(1) of the Act, 21 U.S.C. section 360bbb-3(b)(1), unless the authorization is terminated or revoked.  Performed at [facility], [place] [address], [place], [place] [address]   Urine culture     Status: None   Collection Time: 06/10/20  5:15 PM   Specimen: Urine, Clean Catch  Result Value Ref Range Status   Specimen Description   Final    URINE, CLEAN CATCH Performed at [facility], [place] [address] [place], [place] [address]    Special Requests   Final    NONE Performed at [facility], [place] [address], [place], [place] [address]    Culture   Final    NO GROWTH Performed at [facility], [place] [address], [place], [place] [address]    Report Status 06/12/2020 FINAL  Final     Radiology Studies: No results found.   Marzetta Board, MD, PhD [facility]  Between 7 am - 7 pm I am available, please contact me via Amion or Securechat  Between 7 pm - 7 am I am not available, please contact night coverage MD/APP via Amion

## 2020-06-12 NOTE — Progress Notes (Signed)
Pt was alert at the beginning of shift, pt has become lethargic but still answers questions appropriately (pt states no when asked if she can tell me where she is). Spoke with pt's daughter after she left a message asking for an update. Daughter also spoke with MD over the phone while in pt's room. Jerene Pitch

## 2020-06-13 DIAGNOSIS — R443 Hallucinations, unspecified: Secondary | ICD-10-CM | POA: Diagnosis not present

## 2020-06-13 DIAGNOSIS — I429 Cardiomyopathy, unspecified: Secondary | ICD-10-CM

## 2020-06-13 DIAGNOSIS — E871 Hypo-osmolality and hyponatremia: Secondary | ICD-10-CM | POA: Diagnosis not present

## 2020-06-13 DIAGNOSIS — I5021 Acute systolic (congestive) heart failure: Secondary | ICD-10-CM | POA: Diagnosis not present

## 2020-06-13 DIAGNOSIS — F411 Generalized anxiety disorder: Secondary | ICD-10-CM | POA: Diagnosis not present

## 2020-06-13 DIAGNOSIS — I10 Essential (primary) hypertension: Secondary | ICD-10-CM | POA: Diagnosis not present

## 2020-06-13 LAB — COMPREHENSIVE METABOLIC PANEL
ALT: 14 U/L (ref 0–44)
AST: 20 U/L (ref 15–41)
Albumin: 2.1 g/dL — ABNORMAL LOW (ref 3.5–5.0)
Alkaline Phosphatase: 70 U/L (ref 38–126)
Anion gap: 8 (ref 5–15)
BUN: 12 mg/dL (ref 8–23)
CO2: 28 mmol/L (ref 22–32)
Calcium: 7.6 mg/dL — ABNORMAL LOW (ref 8.9–10.3)
Chloride: 97 mmol/L — ABNORMAL LOW (ref 98–111)
Creatinine, Ser: 0.97 mg/dL (ref 0.44–1.00)
GFR, Estimated: 59 mL/min — ABNORMAL LOW (ref >60)
Glucose, Bld: 87 mg/dL (ref 70–99)
Potassium: 3.6 mmol/L (ref 3.5–5.1)
Sodium: 133 mmol/L — ABNORMAL LOW (ref 135–145)
Total Bilirubin: 0.5 mg/dL (ref 0.3–1.2)
Total Protein: 5.3 g/dL — ABNORMAL LOW (ref 6.5–8.1)

## 2020-06-13 LAB — CBC
HCT: 27.4 % — ABNORMAL LOW (ref 36.0–46.0)
Hemoglobin: 8.3 g/dL — ABNORMAL LOW (ref 12.0–15.0)
MCH: 29.1 pg (ref 26.0–34.0)
MCHC: 30.3 g/dL (ref 30.0–36.0)
MCV: 96.1 fL (ref 80.0–100.0)
Platelets: 242 10*3/uL (ref 150–400)
RBC: 2.85 MIL/uL — ABNORMAL LOW (ref 3.87–5.11)
RDW: 15.5 % (ref 11.5–15.5)
WBC: 5.7 10*3/uL (ref 4.0–10.5)
nRBC: 0 % (ref 0.0–0.2)

## 2020-06-13 NOTE — Plan of Care (Signed)
  Problem: Clinical Measurements: Goal: Ability to maintain clinical measurements within normal limits will improve Outcome: Progressing Goal: Will remain free from infection Outcome: Progressing Goal: Respiratory complications will improve Outcome: Progressing Goal: Cardiovascular complication will be avoided Outcome: Progressing   Problem: Activity: Goal: Risk for activity intolerance will decrease Outcome: Progressing   Problem: Coping: Goal: Level of anxiety will decrease Outcome: Progressing   Problem: Elimination: Goal: Will not experience complications related to bowel motility Outcome: Progressing   Problem: Pain Managment: Goal: General experience of comfort will improve Outcome: Progressing   Problem: Safety: Goal: Ability to remain free from injury will improve Outcome: Progressing   Problem: Skin Integrity: Goal: Risk for impaired skin integrity will decrease Outcome: Progressing

## 2020-06-13 NOTE — Progress Notes (Signed)
PROGRESS NOTE  Christy WAHLSTROM WCH:[id number] DOB: 01-01-1936 DOA: 06/10/2020 PCP: Luetta Nutting, DO   LOS: 3 days   Brief Narrative / Interim history: 84 year old female with GAD, hypertension, hypothyroidism, anemia, osteoporosis was brought to the hospital due to hallucinations that started over 24 hours prior to admission.  Apparently over the last 4 to 5 months she has been getting recurrent UTIs associated with significant weakness and several falls.  Prior cultures showed Klebsiella in August, E. coli in October, most recently Enterobacter in November.  She was seen in the ED few days ago for which she was prescribed Levaquin, hallucinations started a day afterwards.  She was hypothermic in the ED with borderline low blood pressure, she was found to have elevated lactic acid and admitted to the hospital  Subjective / 24h Interval events: No complaints today.  Denies any shortness of breath.  Assessment & Plan: Principal Problem Acute metabolic encephalopathy with hallucinations-possibly due to fluoroquinolone.  No focal deficits. CT head done a few days ago did not show any acute findings, however given recurrent falls and decline I have obtained an MRI which was also negative for acute findings -Cultures have been sent, she has been placed on antibiotics with cefepime, continue.  Cultures are remaining negative  Active Problems Sepsis due to unspecified organism/hypothermia -Possibly due to UTI, chest x-ray also raise possibility for pneumonia.  Continue cefepime and await cultures  Recurrent UTIs -Reason not entirely clear, CT scan of the abdomen without any significant findings to explain recurrent UTIs  Acute systolic CHF -2D echo done 11/25 shows an EF of 30-35% which is significantly lower than the one done 2 months ago.  Cardiology consulted, appreciate input.  Suspect this is contributing to her recurrent weakness at home and falls  Acute hypoxic respiratory  failure -Likely in the setting of pleural effusions in the setting of new onset systolic CHF.  Continue IV Lasix  Abnormal imaging of the right shoulder-x-ray shows large area of cortical lucency in the medial aspect of the right humeral head, unclear if new or chronic.  Apparently Dr. Griffin Basil was aware of this back in August, based on PCP notes it appears that patient is not a surgical candidate.  Outpatient follow-up  Rib fractures on the right-likely due to previous falls, no significant pain.  Possibly causing atelectasis on chest x-ray  Essential hypertension-hold home medications due to hypotension on admission, monitor blood pressure, currently normal  Hypothyroidism-continue Synthroid.  TSH is normal at 3.15  Normocytic anemia- Some drop in hemoglobin noted compared to her baseline which seems to be around 11.  No overt bleeding noted.  Anemia panel with slightly low iron levels  Hyponatremia and hypokalemia -hyponatremia has a chronic component, back in August it was 111.  Thought to be due to volume depletion back then perhaps a component of SIADH.  Sodium has remained stable. Replete potassium  Scheduled Meds: . cycloSPORINE  1 drop Both Eyes BID  . enoxaparin (LOVENOX) injection  40 mg Subcutaneous Q24H  . feeding supplement  237 mL Oral BID BM  . furosemide  40 mg Intravenous Daily  . influenza vaccine adjuvanted  0.5 mL Intramuscular Tomorrow-1000  . levothyroxine  88 mcg Oral QAC breakfast  . multivitamin with minerals  1 tablet Oral Daily  . pantoprazole  40 mg Oral Daily  . potassium chloride  40 mEq Oral Daily  . saccharomyces boulardii  250 mg Oral BID   Continuous Infusions: . ceFEPime (MAXIPIME) IV 2 g (06/13/20  3)   PRN Meds:.acetaminophen **OR** acetaminophen, hydrALAZINE, ondansetron **OR** ondansetron (ZOFRAN) IV  Diet Orders (From admission, onward)    Start     Ordered   06/12/20 1644  DIET SOFT Room service appropriate? Yes; Fluid consistency: Thin   Diet effective now       Question Answer Comment  Room service appropriate? Yes   Fluid consistency: Thin      06/12/20 1643          DVT prophylaxis: enoxaparin (LOVENOX) injection 40 mg Start: 06/10/20 2200     Code Status: DNR  Family Communication: daughter over the phone  Status is: Inpatient  Remains inpatient appropriate because:Altered mental status   Dispo: The patient is from: Home              Anticipated d/c is to: SNF              Anticipated d/c date is: 2 days              Patient currently is not medically stable to d/c.  Consultants:  None   Procedures:  None   Microbiology  Cultures pending   Antimicrobials: Cefepime 11/23 >>    Objective: Vitals:   06/12/20 2118 06/13/20 0157 06/13/20 0428 06/13/20 0530  BP: 113/61 (!) 106/59 (!) 144/60 117/72  Pulse: 82 72 70 75  Resp: 18 18 20 16   Temp: 97.9 F (36.6 C) 97.7 F (36.5 C) (!) 97.5 F (36.4 C) (!) 97.5 F (36.4 C)  TempSrc:  Oral Oral Oral  SpO2: 100% 99% 100% 99%  Weight:      Height:        Intake/Output Summary (Last 24 hours) at 06/13/2020 1225 Last data filed at 06/13/2020 1104 Gross per 24 hour  Intake 656 ml  Output 700 ml  Net -44 ml   Filed Weights   06/10/20 2038  Weight: 57.8 kg    Examination:  Constitutional: NAD Eyes: no icterus  ENMT: mmm Neck: normal, supple Respiratory: CTA biL, no wheezing, no crackles Cardiovascular: RRR, no MRG Abdomen: soft, NT, ND, BS+ Musculoskeletal: no clubbing / cyanosis.  Skin: no rashes Neurologic: no focal deficits   Data Reviewed: I have independently reviewed following labs and imaging studies   CBC: Recent Labs  Lab 06/06/20 1450 06/10/20 1234 06/11/20 0507 06/12/20 0409 06/13/20 0449  WBC 7.8 5.5 7.5 6.5 5.7  NEUTROABS 5.2 4.1  --   --   --   HGB 10.6* 9.3* 8.4* 8.1* 8.3*  HCT 32.3* 28.9* 27.2* 25.6* 27.4*  MCV 90.5 91.2 95.1 93.1 96.1  PLT 393 325 291 264 419   Basic Metabolic Panel: Recent Labs   Lab 06/06/20 1450 06/10/20 1234 06/11/20 0507 06/12/20 0409 06/13/20 0449  NA 132* 133* 133* 133* 133*  K 3.9 3.0* 4.0 3.3* 3.6  CL 92* 94* 99 99 97*  CO2 26 30 24 28 28   GLUCOSE 90 107* 92 95 87  BUN 12 14 13 11 12   CREATININE 0.99 0.86 0.81 0.88 0.97  CALCIUM 8.5* 8.5* 8.1* 7.6* 7.6*  MG  --  1.8 1.7  --   --    Liver Function Tests: Recent Labs  Lab 06/06/20 1450 06/10/20 1234 06/11/20 0507 06/12/20 0409 06/13/20 0449  AST 27 26 20 19 20   ALT 18 18 17 16 14   ALKPHOS 96 81 75 70 70  BILITOT 0.8 0.8 0.5 0.5 0.5  PROT 7.0 6.2* 5.5* 5.4* 5.3*  ALBUMIN 3.2* 2.9* 2.5* 2.3*  2.1*   Coagulation Profile: Recent Labs  Lab 06/10/20 1234 06/11/20 0507  INR 1.2 1.3*   HbA1C: No results for input(s): HGBA1C in the last 72 hours. CBG: No results for input(s): GLUCAP in the last 168 hours.  Recent Results (from the past 240 hour(s))  Blood Culture (routine x 2)     Status: None   Collection Time: 06/06/20  2:52 PM   Specimen: BLOOD  Result Value Ref Range Status   Specimen Description   Final    BLOOD RIGHT ANTECUBITAL Performed at [facility] [address] [place], [place] [address]    Special Requests   Final    BOTTLES DRAWN AEROBIC AND ANAEROBIC Blood Culture adequate volume Performed at [facility] [address] [place], [place] [address]    Culture   Final    NO GROWTH 5 DAYS Performed at [facility], [place] [address], [place], [place] [address]    Report Status 06/11/2020 FINAL  Final  Blood Culture (routine x 2)     Status: None   Collection Time: 06/06/20  2:52 PM   Specimen: BLOOD  Result Value Ref Range Status   Specimen Description   Final    BLOOD LEFT ANTECUBITAL Performed at [facility] [address] [place], [place] [address]    Special Requests   Final    BOTTLES DRAWN AEROBIC AND ANAEROBIC Blood Culture adequate volume Performed at [facility] [address], [place], [place] [address]    Culture   Final    NO GROWTH 5 DAYS Performed at [facility], [place] [address], [place], [place] [address]    Report Status 06/11/2020 FINAL  Final  Urine culture     Status: Abnormal   Collection Time: 06/06/20  4:47 PM   Specimen: In/Out Cath Urine  Result Value Ref Range Status   Specimen Description   Final    IN/OUT CATH URINE Performed at [facility] [address], [place], [place] [address]    Special Requests   Final    NONE Performed at [facility], [place] [address], [place], [place] [address]    Culture 20,000 COLONIES/mL STAPHYLOCOCCUS HOMINIS (A)  Final   Report Status 06/09/2020 FINAL  Final   Organism ID, Bacteria STAPHYLOCOCCUS HOMINIS (A)  Final      Susceptibility   Staphylococcus hominis - MIC*    CIPROFLOXACIN >=8 RESISTANT Resistant     GENTAMICIN <=0.5 SENSITIVE Sensitive     NITROFURANTOIN <=16 SENSITIVE Sensitive     OXACILLIN >=4 RESISTANT Resistant     TETRACYCLINE >=16 RESISTANT Resistant     VANCOMYCIN 2 SENSITIVE Sensitive     TRIMETH/SULFA 40 SENSITIVE Sensitive     CLINDAMYCIN >=8 RESISTANT Resistant     RIFAMPIN <=0.5 SENSITIVE Sensitive     Inducible Clindamycin NEGATIVE Sensitive     * 20,000 COLONIES/mL STAPHYLOCOCCUS HOMINIS  Culture, blood (routine x 2)     Status: None (Preliminary result)   Collection Time: 06/10/20 12:34 PM   Specimen: BLOOD  Result Value Ref Range Status   Specimen Description   Final    BLOOD LEFT WRIST Performed at [facility] [address], [place], [place] [address]    Special Requests   Final    BOTTLES DRAWN AEROBIC AND ANAEROBIC Blood Culture adequate volume Performed at [facility] [address], [place], [place] [address]  Culture   Final    NO GROWTH 2 DAYS Performed at [facility], [place] [address], [place], [place] [address]    Report Status PENDING  Incomplete   Culture, blood (routine x 2)     Status: None (Preliminary result)   Collection Time: 06/10/20 12:34 PM   Specimen: BLOOD  Result Value Ref Range Status   Specimen Description   Final    BLOOD RIGHT ANTECUBITAL Performed at [facility] [address] [place], [place] [address]    Special Requests   Final    BOTTLES DRAWN AEROBIC AND ANAEROBIC Blood Culture results may not be optimal due to an excessive volume of blood received in culture bottles Performed at [facility] [address], [place], [place] [address]    Culture   Final    NO GROWTH 2 DAYS Performed at [facility] [address] [place], [place] [address]    Report Status PENDING  Incomplete  Resp Panel by RT-PCR (Flu A&B, Covid) Nasopharyngeal Swab     Status: None   Collection Time: 06/10/20  4:57 PM   Specimen: Nasopharyngeal Swab; Nasopharyngeal(NP) swabs in vial transport medium  Result Value Ref Range Status   SARS Coronavirus 2 by RT PCR NEGATIVE NEGATIVE Final    Comment: (NOTE) SARS-CoV-2 target nucleic acids are NOT DETECTED.  The SARS-CoV-2 RNA is generally detectable in upper respiratory specimens during the acute phase of infection. The lowest concentration of SARS-CoV-2 viral copies this assay can detect is 138 copies/mL. A negative result does not preclude SARS-Cov-2 infection and should not be used as the sole basis for treatment or other patient management decisions. A negative result may occur with  improper specimen collection/handling, submission of specimen other than nasopharyngeal swab, presence of viral mutation(s) within the areas targeted by this assay, and inadequate number of viral copies(<138 copies/mL). A negative result must be combined with clinical observations, patient history, and epidemiological information. The expected result is Negative.  Fact Sheet for Patients:  EntrepreneurPulse.com.au  Fact Sheet for  Healthcare Providers:  IncredibleEmployment.be  This test is no t yet approved or cleared by the [place] FDA and  has been authorized for detection and/or diagnosis of SARS-CoV-2 by FDA under an Emergency Use Authorization (EUA). This EUA will remain  in effect (meaning this test can be used) for the duration of the COVID-19 declaration under Section 564(b)(1) of the Act, 21 U.S.C.section 360bbb-3(b)(1), unless the authorization is terminated  or revoked sooner.       Influenza A by PCR NEGATIVE NEGATIVE Final   Influenza B by PCR NEGATIVE NEGATIVE Final    Comment: (NOTE) The Xpert Xpress SARS-CoV-2/FLU/RSV plus assay is intended as an aid in the diagnosis of influenza from Nasopharyngeal swab specimens and should not be used as a sole basis for treatment. Nasal washings and aspirates are unacceptable for Xpert Xpress SARS-CoV-2/FLU/RSV testing.  Fact Sheet for Patients: EntrepreneurPulse.com.au  Fact Sheet for Healthcare Providers: IncredibleEmployment.be  This test is not yet approved or cleared by the [place] FDA and has been authorized for detection and/or diagnosis of SARS-CoV-2 by FDA under an Emergency Use Authorization (EUA). This EUA will remain in effect (meaning this test can be used) for the duration of the COVID-19 declaration under Section 564(b)(1) of the Act, 21 U.S.C. section 360bbb-3(b)(1), unless the authorization is terminated or revoked.  Performed at [facility], [place] [address], [place], [place] [address]   Urine  culture     Status: None   Collection Time: 06/10/20  5:15 PM   Specimen: Urine, Clean Catch  Result Value Ref Range Status   Specimen Description   Final    URINE, CLEAN CATCH Performed at [facility], [place] [address], [place], [place] [address]    Special Requests   Final    NONE Performed at [facility], [place]  [address], [place], [place] [address]    Culture   Final    NO GROWTH Performed at [facility], [place] [address] [place],  [address]    Report Status 06/12/2020 FINAL  Final     Radiology Studies: CT ANGIO CHEST PE W OR WO CONTRAST  Result Date: 06/12/2020 CLINICAL DATA:  Shortness of breath, elevated D-dimer EXAM: CT ANGIOGRAPHY CHEST WITH CONTRAST TECHNIQUE: Multidetector CT imaging of the chest was performed using the standard protocol during bolus administration of intravenous contrast. Multiplanar CT image reconstructions and MIPs were obtained to evaluate the vascular anatomy. CONTRAST:  179mL OMNIPAQUE IOHEXOL 350 MG/ML SOLN COMPARISON:  03/25/2020 FINDINGS: Cardiovascular: Satisfactory opacification of the pulmonary arteries to the segmental level. No evidence of pulmonary embolism. Thoracic aorta is nonaneurysmal. There is tortuosity of the descending thoracic aorta. Three vessel arch with widely patent arch vessels. Atherosclerotic calcification of the aorta. Normal heart size. No pericardial effusion. Mediastinum/Nodes: No axillary, mediastinal, or hilar lymphadenopathy. Esophagus within normal limits. Small hiatal hernia. Lungs/Pleura: Moderate to large right-sided pleural effusion with complete atelectasis/collapse of the right lower lobe. Moderate left-sided pleural effusion with partial left lower lobe atelectasis. No pneumothorax. Upper Abdomen: No acute findings identified. Musculoskeletal: Healing fracture of the base of the right coracoid process. Additional nondisplaced healing fracture at the tip of the coracoid process. Large fracture deformity of the medial aspect of the right humeral head. Large glenohumeral joint effusion with intra-articular fracture fragments interval development of numerous prominent calcifications within the right glenohumeral joint capsule and biceps tendon sheath. Os acromiale. Subacute fractures of the right sixth through eleventh ribs. The  posterior eighth and ninth rib fractures appear to extend into the right pleural space posteriorly (series 6, image 70). Exaggerated thoracic kyphosis. Review of the MIP images confirms the above findings. IMPRESSION: 1. No evidence of acute pulmonary embolism. 2. Moderate to large right-sided pleural effusion with complete atelectasis/collapse of the right lower lobe. 3. Moderate left-sided pleural effusion with partial left lower lobe atelectasis. 4. Subacute fractures of the right sixth through eleventh ribs. The posterior eighth and ninth rib fractures appear to extend into the right pleural space posteriorly. No pneumothorax. 5. Large fracture deformity of the medial aspect of the right humeral head with numerous fracture fragments and dystrophic calcifications within the right glenohumeral joint capsule and biceps tendon sheath. Aortic Atherosclerosis (ICD10-I70.0). Electronically Signed   By: Davina Poke D.O.   On: 06/12/2020 13:09   MR BRAIN WO CONTRAST  Result Date: 06/12/2020 CLINICAL DATA:  Mental status changes.  Unknown cause. EXAM: MRI HEAD WITHOUT CONTRAST TECHNIQUE: Multiplanar, multiecho pulse sequences of the brain and surrounding structures were obtained without intravenous contrast. COMPARISON:  CT head without contrast 06/06/2020 FINDINGS: Brain: No acute infarct, hemorrhage, or mass lesion is present. Moderate generalized atrophy and diffuse white matter disease is present bilaterally. The ventricles are proportionate to the degree of atrophy. No significant white matter lesions are present. The brainstem and cerebellum are within normal limits. Vascular: Flow is present in the major intracranial arteries. Skull and upper cervical  spine: The craniocervical junction is normal. Upper cervical spine is within normal limits. Marrow signal is unremarkable. Sinuses/Orbits: The paranasal sinuses and mastoid air cells are clear. The globes and orbits are within normal limits. IMPRESSION: 1.  No acute intracranial abnormality. 2. Moderate generalized atrophy and diffuse white matter disease likely reflects the sequela of chronic microvascular ischemia. Electronically Signed   By: San Morelle M.D.   On: 06/12/2020 17:45     Marzetta Board, MD, PhD [facility]  Between 7 am - 7 pm I am available, please contact me via Amion or Securechat  Between 7 pm - 7 am I am not available, please contact night coverage MD/APP via Amion

## 2020-06-13 NOTE — Consult Note (Addendum)
Cardiology Consultation:   Patient ID: Christy Wade MRN: [id number]; DOB: [birth year]  Admit date: 06/10/2020 Date of Consult: 06/13/2020  Primary Care Provider: Luetta Nutting, DO [place] Cardiologist: No primary care provider on file.  [facility] Electrophysiologist:  None    Patient Profile:   Christy Wade is a 84 y.o. female with a hx of hypertension, hypothyroidism, LBBB, anemia, osteoporosis, recurrent UTIs who is being seen today for the evaluation of decreased EF at the request of Dr. Cruzita Lederer.  History of Present Illness:   Christy Wade has not been seen by cardiology in the past. No prior ischemic evaluation. Echo in September 2021 ordered for hypertension showed LVEF 55-60%, RV mildly enlarged, mod MR calcification, mild AI. Prior to August the patient was living by herself and able to care for herself. In august she started experiencing weakness, apparently had a syncopal event that was suspected to be secondary to UTIs. Urine cultures with multiple different organisms. Since that time she has had recurrent UTIs and persistent weakness. She uses a walker for ambulation. Denies chest pain. She does have occasional sob on exertion. No family history of cardiac disease. No tobacco, alcohol, drug use.   The patient was seen in the ED 06/06/20 and prescribed Levaquin for a UTI. A few days after initiation patient started experiencing hallucinations. She presented to the ED 06/10/20. She was found to have soft pressures and elevated lactic acid. Hallucinations suspected secondary to levaquin and this was discontinued. Patient was given IVF and admitted for further work-up. Echo this admission showed newly decreased EF to 30-35% compared to EF in September of 55-60% and cardiology was consulted.    Past Medical History:  Diagnosis Date  . Anemia    on iron now  . Cataract   . Hypertension   . Hypothyroidism   . Osteoporosis   . PONV (postoperative nausea and  vomiting)   . Thyroid disease   . Varicose vein     Past Surgical History:  Procedure Laterality Date  . ABDOMINAL HYSTERECTOMY  1975   partial  . APPENDECTOMY  1975   with hysterectomy  . HEMORROIDECTOMY     [phone]  . LAPAROSCOPIC SMALL BOWEL RESECTION N/A 11/29/2014   Procedure: LAPAROSCOPIC SMALL INTESTINE RESECTION;  Surgeon: Michael Boston, MD;  Location: [facility];  Service: General;  Laterality: N/A;  . VARICOSE VEIN SURGERY Left 1971     Home Medications:  Prior to Admission medications   Medication Sig Start Date End Date Taking? Authorizing Provider  ALPRAZolam Duanne Moron) 0.25 MG tablet Take 0.25 mg by mouth at bedtime as needed for anxiety.   Yes [provider]  amLODipine (NORVASC) 5 MG tablet Take 1 tablet (5 mg total) by mouth daily. 06/02/20  Yes Luetta Nutting, DO  CRANBERRY-CALCIUM PO Take 1 tablet by mouth daily.   Yes [provider]  cycloSPORINE (RESTASIS) 0.05 % ophthalmic emulsion Place 1 drop into both eyes 2 (two) times daily. 04/24/20  Yes Ngetich, Dinah C, NP  diphenoxylate-atropine (LOMOTIL) 2.5-0.025 MG tablet Take one tablet 1-2x per day as needed for diarrhea. Patient taking differently: Take 1 tablet by mouth daily as needed for diarrhea or loose stools.  06/02/20  Yes Luetta Nutting, DO  furosemide (LASIX) 40 MG tablet Take 1 tablet (40 mg total) by mouth daily. 05/26/20  Yes Luetta Nutting, DO  hydrALAZINE (APRESOLINE) 25 MG tablet Take 1 tablet (25 mg total) by mouth every 8 (eight) hours. 05/26/20  Yes Luetta Nutting, DO  levofloxacin (LEVAQUIN) 500 MG tablet Take 1 tablet (500 mg total) by mouth daily. Patient taking differently: Take 500 mg by mouth daily. Start Date : 06/07/20 06/07/20  Yes Varney Biles, MD  levothyroxine (SYNTHROID) 88 MCG tablet Take 1 tablet (88 mcg total) by mouth daily before breakfast. 05/14/20  Yes Luetta Nutting, DO  mirtazapine (REMERON) 15 MG tablet Take 0.5 tablets (7.5 mg total) by mouth at  bedtime. Patient taking differently: Take 15 mg by mouth at bedtime.  06/02/20  Yes Luetta Nutting, DO  pantoprazole (PROTONIX) 40 MG tablet Take 1 tablet (40 mg total) by mouth daily. 05/12/20  Yes Luetta Nutting, DO  potassium chloride 20 MEQ/15ML (10%) SOLN Take 30 mLs (40 mEq total) by mouth daily. 05/12/20 08/10/20 Yes Luetta Nutting, DO  Menthol, Topical Analgesic, (BIOFREEZE) 4 % GEL Apply 3 oz topically 3 (three) times daily as needed. Patient not taking: Reported on 06/10/2020 04/24/20   Ngetich, Nelda Bucks, NP    Inpatient Medications: Scheduled Meds: . cycloSPORINE  1 drop Both Eyes BID  . enoxaparin (LOVENOX) injection  40 mg Subcutaneous Q24H  . feeding supplement  237 mL Oral BID BM  . furosemide  40 mg Intravenous Daily  . influenza vaccine adjuvanted  0.5 mL Intramuscular Tomorrow-1000  . levothyroxine  88 mcg Oral QAC breakfast  . multivitamin with minerals  1 tablet Oral Daily  . pantoprazole  40 mg Oral Daily  . potassium chloride  40 mEq Oral Daily  . saccharomyces boulardii  250 mg Oral BID   Continuous Infusions: . ceFEPime (MAXIPIME) IV 2 g (06/13/20 0152)   PRN Meds: acetaminophen **OR** acetaminophen, hydrALAZINE, ondansetron **OR** ondansetron (ZOFRAN) IV  Allergies:    Allergies  Allergen Reactions  . Codeine Nausea Only    REACTION: nausea  . Sulfamethoxazole Rash    REACTION: rash    Social History:   Social History   Socioeconomic History  . Marital status: Widowed    Spouse name: Not on file  . Number of children: Not on file  . Years of education: Not on file  . Highest education level: Not on file  Occupational History  . Occupation: Retired  Tobacco Use  . Smoking status: Never Smoker  . Smokeless tobacco: Never Used  Vaping Use  . Vaping Use: Never used  Substance and Sexual Activity  . Alcohol use: No    Alcohol/week: 0.0 standard drinks  . Drug use: No  . Sexual activity: Not Currently  Other Topics Concern  . Not on file   Social History Narrative  . Not on file   Social Determinants of Health   Financial Resource Strain: Low Risk   . Difficulty of Paying Living Expenses: Not hard at all  Food Insecurity: No Food Insecurity  . Worried About Charity fundraiser in the Last Year: Never true  . Ran Out of Food in the Last Year: Never true  Transportation Needs: No Transportation Needs  . Lack of Transportation (Medical): No  . Lack of Transportation (Non-Medical): No  Physical Activity: Inactive  . Days of Exercise per Week: 0 days  . Minutes of Exercise per Session: 0 min  Stress: No Stress Concern Present  . Feeling of Stress : Not at all  Social Connections: Moderately Integrated  . Frequency of Communication with Friends and Family: More than three times a week  . Frequency of Social Gatherings with Friends and Family: More than three times a week  . Attends Religious Services: More than  4 times per year  . Active Member of Clubs or Organizations: Yes  . Attends Archivist Meetings: More than 4 times per year  . Marital Status: Widowed  Intimate Partner Violence:   . Fear of Current or Ex-Partner: Not on file  . Emotionally Abused: Not on file  . Physically Abused: Not on file  . Sexually Abused: Not on file    Family History:   Family History  Problem Relation Age of Onset  . Bone cancer Mother   . Diabetes Father   . Stroke Father   . Breast cancer Sister   . Stomach cancer Neg Hx   . Colon cancer Neg Hx      ROS:  Please see the history of present illness.  All other ROS reviewed and negative.     Physical Exam/Data:   Vitals:   06/12/20 2118 06/13/20 0157 06/13/20 0428 06/13/20 0530  BP: 113/61 (!) 106/59 (!) 144/60 117/72  Pulse: 82 72 70 75  Resp: 18 18 20 16   Temp: 97.9 F (36.6 C) 97.7 F (36.5 C) (!) 97.5 F (36.4 C) (!) 97.5 F (36.4 C)  TempSrc:  Oral Oral Oral  SpO2: 100% 99% 100% 99%  Weight:      Height:        Intake/Output Summary (Last 24  hours) at 06/13/2020 0855 Last data filed at 06/13/2020 0641 Gross per 24 hour  Intake 656 ml  Output 500 ml  Net 156 ml   Last 3 Weights 06/10/2020 06/06/2020 06/02/2020  Weight (lbs) 127 lb 6.8 oz 121 lb 14.6 oz 122 lb  Weight (kg) 57.8 kg 55.3 kg 55.339 kg     Body mass index is 19.96 kg/m.  General:  Well nourished, well developed, in no acute distress HEENT: normal Lymph: no adenopathy Neck: + JVD Endocrine:  No thryomegaly Vascular: No carotid bruits; FA pulses 2+ bilaterally without bruits  Cardiac:  normal S1, S2; RRR; + murmur  Lungs:  Diminished at bases Abd: soft, nontender, no hepatomegaly  Ext: no edema Musculoskeletal:  No deformities, BUE and BLE strength normal and equal Skin: warm and dry  Neuro:  CNs 2-12 intact, no focal abnormalities noted Psych:  Normal affect   EKG:  The EKG was personally reviewed and demonstrates:  NSR, LBBB, PVCs, 71 bpm Telemetry:  Telemetry was personally reviewed and demonstrates:  NSR, HR 70s, frequent PVCs, PACs, brief SVT rates in upper 130s  Relevant CV Studies:  Echo 06/12/20  1. Left ventricular ejection fraction, by estimation, is 30 to 35%. The  left ventricle has moderately decreased function. The left ventricle  demonstrates global hypokinesis. Left ventricular diastolic parameters are  consistent with Grade I diastolic  dysfunction (impaired relaxation). Elevated left atrial pressure. The  average left ventricular global longitudinal strain is -14.0 %. The global  longitudinal strain is abnormal.  2. Right ventricular systolic function is normal. The right ventricular  size is normal. There is normal pulmonary artery systolic pressure. The  estimated right ventricular systolic pressure is 42.6 mmHg.  3. The mitral valve is normal in structure. Mild mitral valve  regurgitation. No evidence of mitral stenosis. Moderate mitral annular  calcification.  4. The aortic valve is calcified. There is mild calcification  of the  aortic valve. There is mild thickening of the aortic valve. Aortic valve  regurgitation is mild. Mild to moderate aortic valve  sclerosis/calcification is present, without any evidence  of aortic stenosis. Aortic regurgitation PHT measures 374 msec. Aortic  valve mean gradient measures 6.0 mmHg. Aortic valve Vmax measures 1.75  m/s.  5. Aortic dilatation noted. There is mild dilatation of the ascending  aorta, measuring 39 mm.  6. The inferior vena cava is dilated in size with <50% respiratory  variability, suggesting right atrial pressure of 15 mmHg.   Echo 03/25/20 1. Left ventricular ejection fraction, by estimation, is 55 to 60%. The  left ventricle has normal function. The left ventricle has no regional  wall motion abnormalities. Left ventricular diastolic function could not  be evaluated.  2. Right ventricular systolic function is normal. The right ventricular  size is mildly enlarged.  3. The mitral valve is degernerative. There is moderate thickening of the  mitral valve leaflet(s). Normal mobility of the mitral valve leaflets.  Moderate mitral annular calcification.  4. The aortic valve is normal in structure. Aortic valve regurgitation is  mild. No aortic stenosis is present.  5. The inferior vena cava is normal in size with greater than 50%  respiratory variability, suggesting right atrial pressure of 3 mmHg.  Laboratory Data:  High Sensitivity Troponin:  No results for input(s): TROPONINIHS in the last 720 hours.   Chemistry Recent Labs  Lab 06/11/20 0507 06/12/20 0409 06/13/20 0449  NA 133* 133* 133*  K 4.0 3.3* 3.6  CL 99 99 97*  CO2 24 28 28   GLUCOSE 92 95 87  BUN 13 11 12   CREATININE 0.81 0.88 0.97  CALCIUM 8.1* 7.6* 7.6*  GFRNONAA >60 >60 59*  ANIONGAP 10 6 8     Recent Labs  Lab 06/11/20 0507 06/12/20 0409 06/13/20 0449  PROT 5.5* 5.4* 5.3*  ALBUMIN 2.5* 2.3* 2.1*  AST 20 19 20   ALT 17 16 14   ALKPHOS 75 70 70  BILITOT 0.5 0.5 0.5    Hematology Recent Labs  Lab 06/11/20 0507 06/12/20 0409 06/13/20 0449  WBC 7.5 6.5 5.7  RBC 2.86*  2.88* 2.75* 2.85*  HGB 8.4* 8.1* 8.3*  HCT 27.2* 25.6* 27.4*  MCV 95.1 93.1 96.1  MCH 29.4 29.5 29.1  MCHC 30.9 31.6 30.3  RDW 15.8* 15.6* 15.5  PLT 291 264 242   BNPNo results for input(s): BNP, PROBNP in the last 168 hours.  DDimer  Recent Labs  Lab 06/11/20 1908  DDIMER 4.00*     Radiology/Studies:  DG Chest 1 View  Result Date: 06/11/2020 CLINICAL DATA:  New shortness of breath today. EXAM: CHEST  1 VIEW COMPARISON:  One-view chest x-ray 06/10/2020 FINDINGS: Heart is enlarged. Right pleural effusion and basilar airspace disease is again seen. Aeration of the left lung is improved. Right-sided rib fractures are again noted. Fracture of the coracoid process demonstrates interval healing. IMPRESSION: 1. Stable right pleural effusion and basilar airspace disease. 2. Improved aeration of the left lung. 3. Healing right coracoid process fracture. Electronically Signed   By: San Morelle M.D.   On: 06/11/2020 06:46   CT ANGIO CHEST PE W OR WO CONTRAST  Result Date: 06/12/2020 CLINICAL DATA:  Shortness of breath, elevated D-dimer EXAM: CT ANGIOGRAPHY CHEST WITH CONTRAST TECHNIQUE: Multidetector CT imaging of the chest was performed using the standard protocol during bolus administration of intravenous contrast. Multiplanar CT image reconstructions and MIPs were obtained to evaluate the vascular anatomy. CONTRAST:  156mL OMNIPAQUE IOHEXOL 350 MG/ML SOLN COMPARISON:  03/25/2020 FINDINGS: Cardiovascular: Satisfactory opacification of the pulmonary arteries to the segmental level. No evidence of pulmonary embolism. Thoracic aorta is nonaneurysmal. There is tortuosity of the descending thoracic aorta. Three vessel arch  with widely patent arch vessels. Atherosclerotic calcification of the aorta. Normal heart size. No pericardial effusion. Mediastinum/Nodes: No axillary, mediastinal,  or hilar lymphadenopathy. Esophagus within normal limits. Small hiatal hernia. Lungs/Pleura: Moderate to large right-sided pleural effusion with complete atelectasis/collapse of the right lower lobe. Moderate left-sided pleural effusion with partial left lower lobe atelectasis. No pneumothorax. Upper Abdomen: No acute findings identified. Musculoskeletal: Healing fracture of the base of the right coracoid process. Additional nondisplaced healing fracture at the tip of the coracoid process. Large fracture deformity of the medial aspect of the right humeral head. Large glenohumeral joint effusion with intra-articular fracture fragments interval development of numerous prominent calcifications within the right glenohumeral joint capsule and biceps tendon sheath. Os acromiale. Subacute fractures of the right sixth through eleventh ribs. The posterior eighth and ninth rib fractures appear to extend into the right pleural space posteriorly (series 6, image 70). Exaggerated thoracic kyphosis. Review of the MIP images confirms the above findings. IMPRESSION: 1. No evidence of acute pulmonary embolism. 2. Moderate to large right-sided pleural effusion with complete atelectasis/collapse of the right lower lobe. 3. Moderate left-sided pleural effusion with partial left lower lobe atelectasis. 4. Subacute fractures of the right sixth through eleventh ribs. The posterior eighth and ninth rib fractures appear to extend into the right pleural space posteriorly. No pneumothorax. 5. Large fracture deformity of the medial aspect of the right humeral head with numerous fracture fragments and dystrophic calcifications within the right glenohumeral joint capsule and biceps tendon sheath. Aortic Atherosclerosis (ICD10-I70.0). Electronically Signed   By: Davina Poke D.O.   On: 06/12/2020 13:09   MR BRAIN WO CONTRAST  Result Date: 06/12/2020 CLINICAL DATA:  Mental status changes.  Unknown cause. EXAM: MRI HEAD WITHOUT CONTRAST  TECHNIQUE: Multiplanar, multiecho pulse sequences of the brain and surrounding structures were obtained without intravenous contrast. COMPARISON:  CT head without contrast 06/06/2020 FINDINGS: Brain: No acute infarct, hemorrhage, or mass lesion is present. Moderate generalized atrophy and diffuse white matter disease is present bilaterally. The ventricles are proportionate to the degree of atrophy. No significant white matter lesions are present. The brainstem and cerebellum are within normal limits. Vascular: Flow is present in the major intracranial arteries. Skull and upper cervical spine: The craniocervical junction is normal. Upper cervical spine is within normal limits. Marrow signal is unremarkable. Sinuses/Orbits: The paranasal sinuses and mastoid air cells are clear. The globes and orbits are within normal limits. IMPRESSION: 1. No acute intracranial abnormality. 2. Moderate generalized atrophy and diffuse white matter disease likely reflects the sequela of chronic microvascular ischemia. Electronically Signed   By: San Morelle M.D.   On: 06/12/2020 17:45   DG Chest Port 1 View  Result Date: 06/10/2020 CLINICAL DATA:  Questionable sepsis, UTI on antibiotics, having hallucinations, unsteady on feet, increased falls, hypertension EXAM: PORTABLE CHEST 1 VIEW COMPARISON:  Portable exam 1307 hours compared to 06/06/2020 FINDINGS: Enlargement of cardiac silhouette. Rotated to the RIGHT with kyphotic positioning. Mediastinal contours and pulmonary vascularity normal. Atelectasis and questionable infiltrate at RIGHT base with associated RIGHT pleural effusion. Minimal subsegmental atelectasis LEFT base. Remaining lungs clear. No pneumothorax. Bones demineralized. IMPRESSION: Atelectasis and small pleural effusion at RIGHT base with questionable associated RIGHT basilar infiltrate. Minimal subsegmental atelectasis LEFT base. Electronically Signed   By: Lavonia Dana M.D.   On: 06/10/2020 13:14    ECHOCARDIOGRAM COMPLETE  Result Date: 06/12/2020    ECHOCARDIOGRAM REPORT   Patient Name:   Christy Wade Date of Exam: 06/12/2020 Medical Rec #:  [id number]       Height:       67.0 in Accession #:    [id number]      Weight:       127.4 lb Date of Birth:  1935/10/31      BSA:          1.670 m Patient Age:    89 years        BP:           108/55 mmHg Patient Gender: F               HR:           68 bpm. Exam Location:  Inpatient Procedure: 2D Echo, 3D Echo, Color Doppler, Cardiac Doppler and Intracardiac            Opacification Agent Indications:    Abnormal ECG 794.31 / R94.31  History:        Patient has prior history of Echocardiogram examinations, most                 recent 03/25/2020. Risk Factors:Hypertension. Thyroid disease.                 Anemia.  Sonographer:    Merrie Roof RDCS Referring Phys: [place]  1. Left ventricular ejection fraction, by estimation, is 30 to 35%. The left ventricle has moderately decreased function. The left ventricle demonstrates global hypokinesis. Left ventricular diastolic parameters are consistent with Grade I diastolic dysfunction (impaired relaxation). Elevated left atrial pressure. The average left ventricular global longitudinal strain is -14.0 %. The global longitudinal strain is abnormal.  2. Right ventricular systolic function is normal. The right ventricular size is normal. There is normal pulmonary artery systolic pressure. The estimated right ventricular systolic pressure is 39.0 mmHg.  3. The mitral valve is normal in structure. Mild mitral valve regurgitation. No evidence of mitral stenosis. Moderate mitral annular calcification.  4. The aortic valve is calcified. There is mild calcification of the aortic valve. There is mild thickening of the aortic valve. Aortic valve regurgitation is mild. Mild to moderate aortic valve sclerosis/calcification is present, without any evidence of aortic stenosis. Aortic regurgitation PHT measures  374 msec. Aortic valve mean gradient measures 6.0 mmHg. Aortic valve Vmax measures 1.75 m/s.  5. Aortic dilatation noted. There is mild dilatation of the ascending aorta, measuring 39 mm.  6. The inferior vena cava is dilated in size with <50% respiratory variability, suggesting right atrial pressure of 15 mmHg. Comparison(s): Prior images reviewed side by side. The left ventricular function is worsened. FINDINGS  Left Ventricle: Left ventricular ejection fraction, by estimation, is 30 to 35%. The left ventricle has moderately decreased function. The left ventricle demonstrates global hypokinesis. Definity contrast agent was given IV to delineate the left ventricular endocardial borders. The average left ventricular global longitudinal strain is -14.0 %. The global longitudinal strain is abnormal. The left ventricular internal cavity size was normal in size. There is no left ventricular hypertrophy. Left ventricular diastolic parameters are consistent with Grade I diastolic dysfunction (impaired relaxation). Elevated left atrial pressure. Right Ventricle: The right ventricular size is normal. No increase in right ventricular wall thickness. Right ventricular systolic function is normal. There is normal pulmonary artery systolic pressure. The tricuspid regurgitant velocity is 2.29 m/s, and  with an assumed right atrial pressure of 15 mmHg, the estimated right ventricular systolic pressure is 30.0 mmHg. Left Atrium: Left atrial size was normal in size. Right Atrium:  Right atrial size was normal in size. Pericardium: There is no evidence of pericardial effusion. Mitral Valve: The mitral valve is normal in structure. Moderate mitral annular calcification. Mild mitral valve regurgitation. No evidence of mitral valve stenosis. Tricuspid Valve: The tricuspid valve is normal in structure. Tricuspid valve regurgitation is mild . No evidence of tricuspid stenosis. Aortic Valve: The aortic valve is calcified. There is mild  calcification of the aortic valve. There is mild thickening of the aortic valve. Aortic valve regurgitation is mild. Aortic regurgitation PHT measures 374 msec. Mild to moderate aortic valve sclerosis/calcification is present, without any evidence of aortic stenosis. Aortic valve mean gradient measures 6.0 mmHg. Aortic valve peak gradient measures 12.2 mmHg. Aortic valve area, by VTI measures 1.35 cm. Pulmonic Valve: The pulmonic valve was normal in structure. Pulmonic valve regurgitation is not visualized. No evidence of pulmonic stenosis. Aorta: Aortic dilatation noted. There is mild dilatation of the ascending aorta, measuring 39 mm. Venous: The inferior vena cava is dilated in size with less than 50% respiratory variability, suggesting right atrial pressure of 15 mmHg. IAS/Shunts: No atrial level shunt detected by color flow Doppler.  LEFT VENTRICLE PLAX 2D LVIDd:         4.60 cm     Diastology LV PW:         0.80 cm     LV e' medial:    5.00 cm/s LV IVS:        1.00 cm     LV E/e' medial:  14.7 LVOT diam:     1.70 cm     LV e' lateral:   4.68 cm/s LV SV:         44          LV E/e' lateral: 15.7 LV SV Index:   26 LVOT Area:     2.27 cm    2D Longitudinal Strain                            2D Strain GLS Avg:     -14.0 %  LV Volumes (MOD) LV vol s, MOD A2C: 97.9 ml                            3D Volume EF:                            3D EF:        40 %                            LV EDV:       124 ml                            LV ESV:       74 ml                            LV SV:        50 ml RIGHT VENTRICLE RV S prime:     18.10 cm/s TAPSE (M-mode): 2.0 cm LEFT ATRIUM             Index       RIGHT ATRIUM  Index LA Vol (A2C):   65.2 ml 39.02 ml/m RA Area:     17.10 cm LA Vol (A4C):   53.1 ml 31.80 ml/m RA Volume:   44.50 ml  26.65 ml/m LA Biplane Vol: 53.1 ml 31.80 ml/m  AORTIC VALVE AV Area (Vmax):    1.23 cm AV Area (Vmean):   1.35 cm AV Area (VTI):     1.35 cm AV Vmax:           175.00 cm/s AV  Vmean:          117.000 cm/s AV VTI:            0.324 m AV Peak Grad:      12.2 mmHg AV Mean Grad:      6.0 mmHg LVOT Vmax:         95.10 cm/s LVOT Vmean:        69.700 cm/s LVOT VTI:          0.193 m LVOT/AV VTI ratio: 0.60 AI PHT:            374 msec  AORTA Ao Root diam: 3.20 cm Ao Asc diam:  3.90 cm MITRAL VALVE                TRICUSPID VALVE MV Area (PHT): 4.86 cm     TR Peak grad:   21.0 mmHg MV Decel Time: 156 msec     TR Vmax:        229.00 cm/s MV E velocity: 73.30 cm/s MV A velocity: 107.00 cm/s  SHUNTS MV E/A ratio:  0.69         Systemic VTI:  0.19 m                             Systemic Diam: 1.70 cm Candee Furbish MD Electronically signed by Candee Furbish MD Signature Date/Time: 06/12/2020/2:01:10 PM    Final    CT RENAL STONE STUDY  Result Date: 06/10/2020 CLINICAL DATA:  Frequent UTI EXAM: CT ABDOMEN AND PELVIS WITHOUT CONTRAST TECHNIQUE: Multidetector CT imaging of the abdomen and pelvis was performed following the standard protocol without IV contrast. COMPARISON:  2016 FINDINGS: Lower chest: Cardiomegaly. Partially imaged right greater than left pleural effusions with adjacent atelectasis/consolidation. Hepatobiliary: No focal liver abnormality is seen. No gallstones, gallbladder wall thickening, or biliary dilatation. Pancreas: Unremarkable. Spleen: Unremarkable. Adrenals/Urinary Tract: Small cyst of the upper pole of the right kidney. No renal calculi hydronephrosis. There are no ureteral calculi identified. The bladder is poorly distended. Stomach/Bowel: Stomach is within normal limits apart from possible small hiatal hernia. Bowel is normal in caliber. There is an anastomosis in the right lower quadrant. Vascular/Lymphatic: Aortic atherosclerosis. No enlarged lymph nodes identified. Reproductive: Status post hysterectomy. No adnexal masses. Other: No ascites.  Mild body wall edema. Musculoskeletal: Degenerative changes of the included spine particularly at L4-L5 and L5-S1. No acute osseous  abnormality. IMPRESSION: No urinary tract calculi. Pyelonephritis is not well evaluated on this noncontrast study. Partially imaged right greater than left pleural effusions with adjacent atelectasis/consolidation. Cardiomegaly. Electronically Signed   By: Macy Mis M.D.   On: 06/10/2020 19:48     Assessment and Plan:   New Cardiomyopathy/Acute on chronic combined heart failure - Unknown etiology of decreased EF, now admitted for sepsis s/p IVF and now with mild acute heart failure possible secondary to fluid resuscitation. - Echo 03/2020 showed LVEF 55-60%, RV mildly enlarged, mod MR calcification, mild AI.  -  Echo this admission showed LVEF 30-35%, G1DD, abnormal global longitudinal strain, mild MR, mild AI, mild aortic dilation 29mm, dilated IVF - No prior ischemic history. Now admitted with possible sepsis from UTI vs pna and hallucinations - IV lasix 40 mg daily - creatinine stable - patient put out 532mL overnight. Take daily weights - Patient has no prior history of MI or CAD. She denies chest pain on exertion however has had weakness and DOE the last couple months suspected secondary to UTIs. EKG shows chronic LBBB. Tele shows frequent PVCs and brief ?SVT. EF now down in the the setting of sepsis. Unsure patient would benefit from ischemic work-up given current functional status and vague symptoms however not unreasonable. If pressure allow start on CHF directed therapy and re-check EF in a couple months and if EF still low might need ischemic eval. Will discuss plan with MD  HTN - PTA amlodipine 5mg  and hydral 25mg  TID>>held for soft pressures - No amlodipine with low EF - can continue hydral - IV lasix 40 mg daily - Would start BB as well as ACE/ARB if pressures allow  AMS with hallucinations - Imaging so far unremarkable - suspected 2/2 to levaquin - Resolved, A&O x 3 on exam  Sepsis/Recurrent UTIs/possible pna - unclear reason for recurrent UTIs - CXR with possible  PNA - CT abdomen unremarkable - abx per IM  Respiratory failure - IV lasix 40 mg daily - d-dimer elevated - CTA chest negative for PE. Showed mod to large b/l pleural effusions  Brief SVT - patient reported syncopal episode in August - consider heart monitor at discharge  For questions or updates, please contact [place] Please consult [email or web address] for contact info under   Signed, Cadence Ninfa Meeker, PA-C  06/13/2020 8:55 AM   The patient was seen, examined and discussed with Cadence Ninfa Meeker, PA-C   and I agree with the above.   84 y.o. female with a hx of hypertension, hypothyroidism, LBBB, anemia, osteoporosis, recurrent UTIs who is being seen today for the evaluation of new cardiomyopathy, new decreased LVEF at the request of Dr. Cruzita Lederer. No prior cardiac history. Echo in 03/2020 ordered for hypertension showed LVEF 55-60%, RV mildly enlarged, mod MR calcification, mild AI. Prior to August the patient was living by herself and able to care for herself. In august she started experiencing weakness, apparently had a syncopal event that was suspected to be secondary to UTIs. Urine cultures with multiple different organisms. Since that time she has had recurrent UTIs and persistent weakness. She uses a walker for ambulation. She denies chest pain in the past, no significant or SOB other than on moderate exertion. No LE edema.   Seen in ER on 06/06/20 for UTI and started on Levaquin. A few days after initiation patient started experiencing hallucinations, she was admitted on 06/10/20 with urosepsis and acute mental status changes, elevated lactic acid. Hallucinations suspected secondary to levaquin and this was discontinued. Patient recieved agressive IVF resuscitation. Echo this admission showed newly decreased EF to 30-35% compared to EF in September of 55-60%.  On physical exam she is AAO x 3, has JVD + 4 cm B/L, crackles at both lung bases, S1,2 no murmur, warm LE with good pulses and  no edema.  A/P  New CMP< LVEF 30-35% Hypertension  No prior cardiac workup, no prior chest pain. I would treat for fluid overload with lasix 40 mg iv daily, baseline weight 119 lbs. BP soft, monitor carefully. Continue holding amlodipine, start losartan  25 mg po daily and Toprol XL 25 mg po daily once tolerated by BP> I would hold off on ischemia evaluation, treat underlying urosepsis, Follow up in our clinic, repeat echo in 3 months and consider ischemia and cardiomyopathy evaluation at that time if LVEF still decreased.  Ena Dawley, MD 06/13/2020

## 2020-06-13 NOTE — Progress Notes (Signed)
Pharmacy Antibiotic Note  Christy Wade is a 84 y.o. female on levaquin PTA for UTI, presented to the ED on 06/10/2020 with hallucination causing increased falls. She's currently on cefepime for UTI/sepsis.  Today, 06/13/2020: - afeb, wbc wnl - scr trending up 0.97 (crcl ~41) - all cultures have been negative thus far  Plan: - continue cefepime 2gm IV q12h - f/u renal function and LOT for abx  _________________________________________  Height: 5\' 7"  (170.2 cm) Weight: 57.8 kg (127 lb 6.8 oz) IBW/kg (Calculated) : 61.6  Temp (24hrs), Avg:97.9 F (36.6 C), Min:97.5 F (36.4 C), Max:98.3 F (36.8 C)  Recent Labs  Lab 06/06/20 1448 06/06/20 1450 06/06/20 1705 06/06/20 1903 06/10/20 1234 06/11/20 0507 06/12/20 0409 06/13/20 0449  WBC  --  7.8  --   --  5.5 7.5 6.5 5.7  CREATININE  --  0.99  --   --  0.86 0.81 0.88 0.97  LATICACIDVEN 1.9  --  2.6* 2.0* 1.4  --   --   --     Estimated Creatinine Clearance: 41.5 mL/min (by C-G formula based on SCr of 0.97 mg/dL).    Allergies  Allergen Reactions  . Codeine Nausea Only    REACTION: nausea  . Sulfamethoxazole Rash    REACTION: rash   Antimicrobials this admission: 11/23 cefepime >>   Microbiology results: Previous: 06/06/20 UCx: 20k staph hominis - contaminant  06/02/20 UCx: enterobacter - resistant augmentin, ancef, rocephin, zosyn 05/05/20 UCx: Ecoli - pan-sensitive 03/06/20 UCx: Kleb pneumo - resistant to amp  11/23 BCx x2:  11/23 UCx: NGF  Thank you for allowing pharmacy to be a part of this patient's care.  Lynelle Doctor 06/13/2020 10:11 AM

## 2020-06-14 DIAGNOSIS — I5021 Acute systolic (congestive) heart failure: Secondary | ICD-10-CM

## 2020-06-14 DIAGNOSIS — I447 Left bundle-branch block, unspecified: Secondary | ICD-10-CM | POA: Diagnosis not present

## 2020-06-14 DIAGNOSIS — I10 Essential (primary) hypertension: Secondary | ICD-10-CM | POA: Diagnosis not present

## 2020-06-14 DIAGNOSIS — N390 Urinary tract infection, site not specified: Secondary | ICD-10-CM | POA: Diagnosis not present

## 2020-06-14 LAB — CBC
HCT: 30.7 % — ABNORMAL LOW (ref 36.0–46.0)
Hemoglobin: 9.1 g/dL — ABNORMAL LOW (ref 12.0–15.0)
MCH: 29 pg (ref 26.0–34.0)
MCHC: 29.6 g/dL — ABNORMAL LOW (ref 30.0–36.0)
MCV: 97.8 fL (ref 80.0–100.0)
Platelets: 226 10*3/uL (ref 150–400)
RBC: 3.14 MIL/uL — ABNORMAL LOW (ref 3.87–5.11)
RDW: 15.1 % (ref 11.5–15.5)
WBC: 5.5 10*3/uL (ref 4.0–10.5)
nRBC: 0 % (ref 0.0–0.2)

## 2020-06-14 LAB — COMPREHENSIVE METABOLIC PANEL
ALT: 18 U/L (ref 0–44)
AST: 26 U/L (ref 15–41)
Albumin: 2.3 g/dL — ABNORMAL LOW (ref 3.5–5.0)
Alkaline Phosphatase: 73 U/L (ref 38–126)
Anion gap: 9 (ref 5–15)
BUN: 14 mg/dL (ref 8–23)
CO2: 31 mmol/L (ref 22–32)
Calcium: 8 mg/dL — ABNORMAL LOW (ref 8.9–10.3)
Chloride: 96 mmol/L — ABNORMAL LOW (ref 98–111)
Creatinine, Ser: 0.79 mg/dL (ref 0.44–1.00)
GFR, Estimated: >60 mL/min (ref >60)
Glucose, Bld: 93 mg/dL (ref 70–99)
Potassium: 4.2 mmol/L (ref 3.5–5.1)
Sodium: 136 mmol/L (ref 135–145)
Total Bilirubin: 0.3 mg/dL (ref 0.3–1.2)
Total Protein: 5.6 g/dL — ABNORMAL LOW (ref 6.5–8.1)

## 2020-06-14 NOTE — Plan of Care (Signed)
  Problem: Education: Goal: Knowledge of General Education information will improve Description: Including pain rating scale, medication(s)/side effects and non-pharmacologic comfort measures Outcome: Progressing   Problem: Clinical Measurements: Goal: Will remain free from infection Outcome: Progressing Goal: Respiratory complications will improve Outcome: Progressing Goal: Cardiovascular complication will be avoided Outcome: Progressing   Problem: Activity: Goal: Risk for activity intolerance will decrease Outcome: Progressing   Problem: Coping: Goal: Level of anxiety will decrease Outcome: Progressing   Problem: Pain Managment: Goal: General experience of comfort will improve Outcome: Progressing   Problem: Safety: Goal: Ability to remain free from injury will improve Outcome: Progressing   Problem: Skin Integrity: Goal: Risk for impaired skin integrity will decrease Outcome: Progressing   Problem: Fluid Volume: Goal: Hemodynamic stability will improve Outcome: Progressing

## 2020-06-14 NOTE — Progress Notes (Signed)
DAILY PROGRESS NOTE   Patient Name: Christy Wade Date of Encounter: 06/14/2020 Cardiologist: Ena Dawley, MD  Chief Complaint   Tingling in hands and feet  Patient Profile   Christy Wade is a 84 y.o. female with a hx of hypertension, hypothyroidism, LBBB, anemia, osteoporosis, recurrent UTIs who is being seen today for the evaluation of decreased EF at the request of Dr. Cruzita Lederer.  Subjective   Recorded positive overnight - weight 56.5Kg (was 57.8), so suspect she is diuresing.  Creatinine improved overnight. Overall weight is much lower than this past summer (66 kg).  Objective   Vitals:   06/13/20 0530 06/13/20 1305 06/13/20 1900 06/14/20 0435  BP: 117/72 121/63 108/60 133/66  Pulse: 75 85 91 81  Resp: 16 20  18   Temp: (!) 97.5 F (36.4 C) 97.7 F (36.5 C) 99 F (37.2 C) 97.7 F (36.5 C)  TempSrc: Oral Oral Oral Oral  SpO2: 99% 100% 100% 100%  Weight:    56.5 kg  Height:        Intake/Output Summary (Last 24 hours) at 06/14/2020 0901 Last data filed at 06/14/2020 0600 Gross per 24 hour  Intake 1853.97 ml  Output 1000 ml  Net 853.97 ml   Filed Weights   06/10/20 2038 06/14/20 0435  Weight: 57.8 kg 56.5 kg    Physical Exam   General appearance: alert and no distress Neck: no carotid bruit, no JVD and thyroid not enlarged, symmetric, no tenderness/mass/nodules Lungs: clear to auscultation bilaterally Heart: regular rate and rhythm, S1, S2 normal, no murmur, click, rub or gallop Abdomen: soft, non-tender; bowel sounds normal; no masses,  no organomegaly Extremities: extremities normal, atraumatic, no cyanosis or edema Pulses: 2+ and symmetric Skin: Skin color, texture, turgor normal. No rashes or lesions Neurologic: Grossly normal Psych: Pleasant  Inpatient Medications    Scheduled Meds: . cycloSPORINE  1 drop Both Eyes BID  . enoxaparin (LOVENOX) injection  40 mg Subcutaneous Q24H  . feeding supplement  237 mL Oral BID BM  . furosemide   40 mg Intravenous Daily  . influenza vaccine adjuvanted  0.5 mL Intramuscular Tomorrow-1000  . levothyroxine  88 mcg Oral QAC breakfast  . multivitamin with minerals  1 tablet Oral Daily  . pantoprazole  40 mg Oral Daily  . potassium chloride  40 mEq Oral Daily  . saccharomyces boulardii  250 mg Oral BID    Continuous Infusions: . ceFEPime (MAXIPIME) IV 2 g (06/14/20 0156)    PRN Meds: acetaminophen **OR** acetaminophen, hydrALAZINE, ondansetron **OR** ondansetron (ZOFRAN) IV   Labs   Results for orders placed or performed during the hospital encounter of 06/10/20 (from the past 48 hour(s))  Comprehensive metabolic panel     Status: Abnormal   Collection Time: 06/13/20  4:49 AM  Result Value Ref Range   Sodium 133 (L) 135 - 145 mmol/L   Potassium 3.6 3.5 - 5.1 mmol/L   [place] 97 (L) 98 - 111 mmol/L   CO2 28 22 - 32 mmol/L   Glucose, Bld 87 70 - 99 mg/dL    Comment: Glucose reference range applies only to samples taken after fasting for at least 8 hours.   BUN 12 8 - 23 mg/dL   Creatinine, Ser 0.97 0.44 - 1.00 mg/dL   Calcium 7.6 (L) 8.9 - 10.3 mg/dL   Total Protein 5.3 (L) 6.5 - 8.1 g/dL   Albumin 2.1 (L) 3.5 - 5.0 g/dL   AST 20 15 - 41 U/L  ALT 14 0 - 44 U/L   Alkaline Phosphatase 70 38 - 126 U/L   Total Bilirubin 0.5 0.3 - 1.2 mg/dL   GFR, Estimated 59 (L) >60 mL/min    Comment: (NOTE) Calculated using the CKD-EPI Creatinine Equation (2021)    Anion gap 8 5 - 15    Comment: Performed at [facility], [place] [address] [place], [place] [address]  CBC     Status: Abnormal   Collection Time: 06/13/20  4:49 AM  Result Value Ref Range   WBC 5.7 4.0 - 10.5 K/uL   RBC 2.85 (L) 3.87 - 5.11 MIL/uL   Hemoglobin 8.3 (L) 12.0 - 15.0 g/dL   HCT 27.4 (L) 36 - 46 %   MCV 96.1 80.0 - 100.0 fL   MCH 29.1 26.0 - 34.0 pg   MCHC 30.3 30.0 - 36.0 g/dL   RDW 15.5 11.5 - 15.5 %   Platelets 242 150 - 400 K/uL   nRBC 0.0 0.0 - 0.2 %    Comment: Performed at [facility], [place] [address] [place], [place] [address]  Comprehensive metabolic panel     Status: Abnormal   Collection Time: 06/14/20  4:45 AM  Result Value Ref Range   Sodium 136 135 - 145 mmol/L   Potassium 4.2 3.5 - 5.1 mmol/L   Ch[place] 96 (L) 98 - 111 mmol/L   CO2 31 22 - 32 mmol/L   Glucose, Bld 93 70 - 99 mg/dL    Comment: Glucose reference range applies only to samples taken after fasting for at least 8 hours.   BUN 14 8 - 23 mg/dL   Creatinine, Ser 0.79 0.44 - 1.00 mg/dL   Calcium 8.0 (L) 8.9 - 10.3 mg/dL   Total Protein 5.6 (L) 6.5 - 8.1 g/dL   Albumin 2.3 (L) 3.5 - 5.0 g/dL   AST 26 15 - 41 U/L   ALT 18 0 - 44 U/L   Alkaline Phosphatase 73 38 - 126 U/L   Total Bilirubin 0.3 0.3 - 1.2 mg/dL   GFR, Estimated >60 >60 mL/min    Comment: (NOTE) Calculated using the CKD-EPI Creatinine Equation (2021)    Anion gap 9 5 - 15    Comment: Performed at [facility], [place] [address] [place], [place] [address]  CBC     Status: Abnormal   Collection Time: 06/14/20  4:45 AM  Result Value Ref Range   WBC 5.5 4.0 - 10.5 K/uL   RBC 3.14 (L) 3.87 - 5.11 MIL/uL   Hemoglobin 9.1 (L) 12.0 - 15.0 g/dL   HCT 30.7 (L) 36 - 46 %   MCV 97.8 80.0 - 100.0 fL   MCH 29.0 26.0 - 34.0 pg   MCHC 29.6 (L) 30.0 - 36.0 g/dL   RDW 15.1 11.5 - 15.5 %   Platelets 226 150 - 400 K/uL   nRBC 0.0 0.0 - 0.2 %    Comment: Performed at [facility], [place] [address] [place], [place] [address]    ECG   N/A  Telemetry   Sinus rhythm at 82 - Personally Reviewed  Radiology    CT ANGIO CHEST PE W OR WO CONTRAST  Result Date: 06/12/2020 CLINICAL DATA:  Shortness of breath, elevated D-dimer EXAM: CT ANGIOGRAPHY CHEST WITH CONTRAST TECHNIQUE: Multidetector CT imaging of the chest was performed using the standard protocol during bolus administration of intravenous contrast. Multiplanar CT image reconstructions and MIPs were obtained to evaluate  the vascular  anatomy. CONTRAST:  166mL OMNIPAQUE IOHEXOL 350 MG/ML SOLN COMPARISON:  03/25/2020 FINDINGS: Cardiovascular: Satisfactory opacification of the pulmonary arteries to the segmental level. No evidence of pulmonary embolism. Thoracic aorta is nonaneurysmal. There is tortuosity of the descending thoracic aorta. Three vessel arch with widely patent arch vessels. Atherosclerotic calcification of the aorta. Normal heart size. No pericardial effusion. Mediastinum/Nodes: No axillary, mediastinal, or hilar lymphadenopathy. Esophagus within normal limits. Small hiatal hernia. Lungs/Pleura: Moderate to large right-sided pleural effusion with complete atelectasis/collapse of the right lower lobe. Moderate left-sided pleural effusion with partial left lower lobe atelectasis. No pneumothorax. Upper Abdomen: No acute findings identified. Musculoskeletal: Healing fracture of the base of the right coracoid process. Additional nondisplaced healing fracture at the tip of the coracoid process. Large fracture deformity of the medial aspect of the right humeral head. Large glenohumeral joint effusion with intra-articular fracture fragments interval development of numerous prominent calcifications within the right glenohumeral joint capsule and biceps tendon sheath. Os acromiale. Subacute fractures of the right sixth through eleventh ribs. The posterior eighth and ninth rib fractures appear to extend into the right pleural space posteriorly (series 6, image 70). Exaggerated thoracic kyphosis. Review of the MIP images confirms the above findings. IMPRESSION: 1. No evidence of acute pulmonary embolism. 2. Moderate to large right-sided pleural effusion with complete atelectasis/collapse of the right lower lobe. 3. Moderate left-sided pleural effusion with partial left lower lobe atelectasis. 4. Subacute fractures of the right sixth through eleventh ribs. The posterior eighth and ninth rib fractures appear to extend into the right pleural space  posteriorly. No pneumothorax. 5. Large fracture deformity of the medial aspect of the right humeral head with numerous fracture fragments and dystrophic calcifications within the right glenohumeral joint capsule and biceps tendon sheath. Aortic Atherosclerosis (ICD10-I70.0). Electronically Signed   By: Davina Poke D.O.   On: 06/12/2020 13:09   MR BRAIN WO CONTRAST  Result Date: 06/12/2020 CLINICAL DATA:  Mental status changes.  Unknown cause. EXAM: MRI HEAD WITHOUT CONTRAST TECHNIQUE: Multiplanar, multiecho pulse sequences of the brain and surrounding structures were obtained without intravenous contrast. COMPARISON:  CT head without contrast 06/06/2020 FINDINGS: Brain: No acute infarct, hemorrhage, or mass lesion is present. Moderate generalized atrophy and diffuse white matter disease is present bilaterally. The ventricles are proportionate to the degree of atrophy. No significant white matter lesions are present. The brainstem and cerebellum are within normal limits. Vascular: Flow is present in the major intracranial arteries. Skull and upper cervical spine: The craniocervical junction is normal. Upper cervical spine is within normal limits. Marrow signal is unremarkable. Sinuses/Orbits: The paranasal sinuses and mastoid air cells are clear. The globes and orbits are within normal limits. IMPRESSION: 1. No acute intracranial abnormality. 2. Moderate generalized atrophy and diffuse white matter disease likely reflects the sequela of chronic microvascular ischemia. Electronically Signed   By: San Morelle M.D.   On: 06/12/2020 17:45   ECHOCARDIOGRAM COMPLETE  Result Date: 06/12/2020    ECHOCARDIOGRAM REPORT   Patient Name:   Christy Wade Date of Exam: 06/12/2020 Medical Rec #:  [id number]       Height:       67.0 in Accession #:    [id number]      Weight:       127.4 lb Date of Birth:  1935-11-16      BSA:          1.670 m Patient Age:    35 years  BP:           108/55 mmHg Patient  Gender: F               HR:           68 bpm. Exam Location:  Inpatient Procedure: 2D Echo, 3D Echo, Color Doppler, Cardiac Doppler and Intracardiac            Opacification Agent Indications:    Abnormal ECG 794.31 / R94.31  History:        Patient has prior history of Echocardiogram examinations, most                 recent 03/25/2020. Risk Factors:Hypertension. Thyroid disease.                 Anemia.  Sonographer:    Merrie Roof RDCS Referring Phys: [place]  1. Left ventricular ejection fraction, by estimation, is 30 to 35%. The left ventricle has moderately decreased function. The left ventricle demonstrates global hypokinesis. Left ventricular diastolic parameters are consistent with Grade I diastolic dysfunction (impaired relaxation). Elevated left atrial pressure. The average left ventricular global longitudinal strain is -14.0 %. The global longitudinal strain is abnormal.  2. Right ventricular systolic function is normal. The right ventricular size is normal. There is normal pulmonary artery systolic pressure. The estimated right ventricular systolic pressure is 16.1 mmHg.  3. The mitral valve is normal in structure. Mild mitral valve regurgitation. No evidence of mitral stenosis. Moderate mitral annular calcification.  4. The aortic valve is calcified. There is mild calcification of the aortic valve. There is mild thickening of the aortic valve. Aortic valve regurgitation is mild. Mild to moderate aortic valve sclerosis/calcification is present, without any evidence of aortic stenosis. Aortic regurgitation PHT measures 374 msec. Aortic valve mean gradient measures 6.0 mmHg. Aortic valve Vmax measures 1.75 m/s.  5. Aortic dilatation noted. There is mild dilatation of the ascending aorta, measuring 39 mm.  6. The inferior vena cava is dilated in size with <50% respiratory variability, suggesting right atrial pressure of 15 mmHg. Comparison(s): Prior images reviewed side by side. The  left ventricular function is worsened. FINDINGS  Left Ventricle: Left ventricular ejection fraction, by estimation, is 30 to 35%. The left ventricle has moderately decreased function. The left ventricle demonstrates global hypokinesis. Definity contrast agent was given IV to delineate the left ventricular endocardial borders. The average left ventricular global longitudinal strain is -14.0 %. The global longitudinal strain is abnormal. The left ventricular internal cavity size was normal in size. There is no left ventricular hypertrophy. Left ventricular diastolic parameters are consistent with Grade I diastolic dysfunction (impaired relaxation). Elevated left atrial pressure. Right Ventricle: The right ventricular size is normal. No increase in right ventricular wall thickness. Right ventricular systolic function is normal. There is normal pulmonary artery systolic pressure. The tricuspid regurgitant velocity is 2.29 m/s, and  with an assumed right atrial pressure of 15 mmHg, the estimated right ventricular systolic pressure is 09.6 mmHg. Left Atrium: Left atrial size was normal in size. Right Atrium: Right atrial size was normal in size. Pericardium: There is no evidence of pericardial effusion. Mitral Valve: The mitral valve is normal in structure. Moderate mitral annular calcification. Mild mitral valve regurgitation. No evidence of mitral valve stenosis. Tricuspid Valve: The tricuspid valve is normal in structure. Tricuspid valve regurgitation is mild . No evidence of tricuspid stenosis. Aortic Valve: The aortic valve is calcified. There is mild calcification of the  aortic valve. There is mild thickening of the aortic valve. Aortic valve regurgitation is mild. Aortic regurgitation PHT measures 374 msec. Mild to moderate aortic valve sclerosis/calcification is present, without any evidence of aortic stenosis. Aortic valve mean gradient measures 6.0 mmHg. Aortic valve peak gradient measures 12.2 mmHg. Aortic  valve area, by VTI measures 1.35 cm. Pulmonic Valve: The pulmonic valve was normal in structure. Pulmonic valve regurgitation is not visualized. No evidence of pulmonic stenosis. Aorta: Aortic dilatation noted. There is mild dilatation of the ascending aorta, measuring 39 mm. Venous: The inferior vena cava is dilated in size with less than 50% respiratory variability, suggesting right atrial pressure of 15 mmHg. IAS/Shunts: No atrial level shunt detected by color flow Doppler.  LEFT VENTRICLE PLAX 2D LVIDd:         4.60 cm     Diastology LV PW:         0.80 cm     LV e' medial:    5.00 cm/s LV IVS:        1.00 cm     LV E/e' medial:  14.7 LVOT diam:     1.70 cm     LV e' lateral:   4.68 cm/s LV SV:         44          LV E/e' lateral: 15.7 LV SV Index:   26 LVOT Area:     2.27 cm    2D Longitudinal Strain                            2D Strain GLS Avg:     -14.0 %  LV Volumes (MOD) LV vol s, MOD A2C: 97.9 ml                            3D Volume EF:                            3D EF:        40 %                            LV EDV:       124 ml                            LV ESV:       74 ml                            LV SV:        50 ml RIGHT VENTRICLE RV S prime:     18.10 cm/s TAPSE (M-mode): 2.0 cm LEFT ATRIUM             Index       RIGHT ATRIUM           Index LA Vol (A2C):   65.2 ml 39.02 ml/m RA Area:     17.10 cm LA Vol (A4C):   53.1 ml 31.80 ml/m RA Volume:   44.50 ml  26.65 ml/m LA Biplane Vol: 53.1 ml 31.80 ml/m  AORTIC VALVE AV Area (Vmax):    1.23 cm AV Area (Vmean):   1.35 cm AV Area (VTI):     1.35 cm AV  Vmax:           175.00 cm/s AV Vmean:          117.000 cm/s AV VTI:            0.324 m AV Peak Grad:      12.2 mmHg AV Mean Grad:      6.0 mmHg LVOT Vmax:         95.10 cm/s LVOT Vmean:        69.700 cm/s LVOT VTI:          0.193 m LVOT/AV VTI ratio: 0.60 AI PHT:            374 msec  AORTA Ao Root diam: 3.20 cm Ao Asc diam:  3.90 cm MITRAL VALVE                TRICUSPID VALVE MV Area (PHT): 4.86  cm     TR Peak grad:   21.0 mmHg MV Decel Time: 156 msec     TR Vmax:        229.00 cm/s MV E velocity: 73.30 cm/s MV A velocity: 107.00 cm/s  SHUNTS MV E/A ratio:  0.69         Systemic VTI:  0.19 m                             Systemic Diam: 1.70 cm Candee Furbish MD Electronically signed by Candee Furbish MD Signature Date/Time: 06/12/2020/2:01:10 PM    Final     Cardiac Studies   See above  Assessment   1. Active Problems: 2.   Hypothyroidism 3.   Essential hypertension 4.   Hyponatremia 5.   GAD (generalized anxiety disorder) 6.   Recurrent UTI 7.   Hallucinations 8.   Sepsis ([place]) 9.   Hypoxia 10.   Plan   1. Diuresing well - creatinine stable. Breathing better today. Continue diuresis. LBBB is wide (>150 msec), suspect this could be cause of cardiomyopathy. BP improved somewhat today-  May consider adding in low dose losartan tomorrow if BP stable. Plan to defer ischemic work-up until outpatient.  Time Spent Directly with Patient:  I have spent a total of 25 minutes with the patient reviewing hospital notes, telemetry, EKGs, labs and examining the patient as well as establishing an assessment and plan that was discussed personally with the patient.  > 50% of time was spent in direct patient care.  Length of Stay:  LOS: 4 days   Pixie Casino, MD, [facility], [employer] Director of the Advanced Lipid Disorders &  Cardiovascular Risk Reduction Clinic Diplomate of the American Board of Clinical Lipidology Attending Cardiologist  Direct Dial: [phone]  Fax: [phone]  Website:  [email or web address] Reason 06/14/2020, 9:01 AM

## 2020-06-14 NOTE — Progress Notes (Signed)
Physical Therapy Treatment Patient Details Name: Christy Wade MRN: [id number] DOB: 06-18-1936 Today's Date: 06/14/2020    History of Present Illness NYELLE WOLFSON is a 84 y.o. female with a past medical history of generalized anxiety disorder, essential hypertension, hypothyroidism, anemia, osteoporosis ,  h/o Rt 7-9 rib fractures and Rt shoulder AC joint seperated shoudler in 9/21  who was brought into the hospital 06/10/20 due to hallucinations , being treated for UTI,In the emergency department patient was  hypothermic with borderline low blood pressures,elevated lactic acid level. SOB overnight 11/24 and placed on @ L O2    PT Comments    Pt progressing toward goals. Improved overall today, able to transfer and amb short distance with RW and min assist.  Continue to recommend SNF unless family/caregivers able to provide 24hr assist. Continue PT in acute setting  Follow Up Recommendations  SNF;Supervision/Assistance - 24 hour (unless family able to provide to assist )     Equipment Recommendations  None recommended by PT    Recommendations for Other Services       Precautions / Restrictions Precautions Precautions: Fall Precaution Comments: R AC separation right rib fxs in september Restrictions Weight Bearing Restrictions: No    Mobility  Bed Mobility Overal bed mobility: Needs Assistance Bed Mobility: Supine to Sit     Supine to sit: Min assist     General bed mobility comments: cues for sequence and self assist. requires assist/facilitation to bring LEs off bed and elevate trunk   Transfers Overall transfer level: Needs assistance Equipment used: Rolling walker (2 wheeled) Transfers: Sit to/from [facility] Sit to Stand: Min assist;+2 safety/equipment Stand pivot transfers: Min assist;+2 safety/equipment       General transfer comment: multi-modal cues for attention to task, sequencing, self assist   Ambulation/Gait Ambulation/Gait  assistance: Min assist;+2 safety/equipment Gait Distance (Feet): 5 Feet Assistive device: Rolling walker (2 wheeled) Gait Pattern/deviations: Step-through pattern;Decreased stride length;Trunk flexed     General Gait Details: min assist for balance and safety, pt fearful of falling, short distance from [facility] to chair    Stairs             Wheelchair Mobility    Modified Rankin (Stroke Patients Only)       Balance   Sitting-balance support: Feet supported;Bilateral upper extremity supported Sitting balance-Leahy Scale: Fair Sitting balance - Comments: goes into full trunk flexion at times resting on her knees. corrects and maintains midline with cues       Standing balance support: Bilateral upper extremity supported;During functional activity Standing balance-Leahy Scale: Poor Standing balance comment: reliant on UEs and external assist                             Cognition Arousal/Alertness: Awake/alert Behavior During Therapy: WFL for tasks assessed/performed Overall Cognitive Status: No family/caregiver present to determine baseline cognitive functioning Area of Impairment: Memory                     Memory: Decreased short-term memory         General Comments: oriented to place and date but perseverating on bil LE numbness and that she was "so sick"      Exercises      General Comments        Pertinent Vitals/Pain Pain Assessment: Faces Faces Pain Scale: Hurts a little bit Pain Location: R shoulder Pain Descriptors / Indicators: Sore Pain Intervention(s): Limited  activity within patient's tolerance;Monitored during session;Repositioned    Home Living                      Prior Function            PT Goals (current goals can now be found in the care plan section) Acute Rehab PT Goals Patient Stated Goal: go home PT Goal Formulation: With patient Time For Goal Achievement: 06/25/20 Potential to Achieve Goals:  Good Progress towards PT goals: Progressing toward goals    Frequency    Min 2X/week      PT Plan Current plan remains appropriate    Co-evaluation              AM-PAC PT "6 Clicks" Mobility   Outcome Measure  Help needed turning from your back to your side while in a flat bed without using bedrails?: A Little Help needed moving from lying on your back to sitting on the side of a flat bed without using bedrails?: A Little Help needed moving to and from a bed to a chair (including a wheelchair)?: A Little Help needed standing up from a chair using your arms (e.g., wheelchair or bedside chair)?: A Little Help needed to walk in hospital room?: A Lot Help needed climbing 3-5 steps with a railing? : A Lot 6 Click Score: 16    End of Session Equipment Utilized During Treatment: Gait belt Activity Tolerance: Patient tolerated treatment well Patient left: in chair;with call bell/phone within reach;with chair alarm set Nurse Communication: Mobility status PT Visit Diagnosis: Unsteadiness on feet (R26.81);Difficulty in walking, not elsewhere classified (R26.2);History of falling (Z91.81)     Time: 4970-2637 PT Time Calculation (min) (ACUTE ONLY): 27 min  Charges:  $Gait Training: 23-37 mins                     Baxter Flattery, PT  Acute Rehab Dept ([place]) [phone] Pager [phone]  06/14/2020    [facility] 06/14/2020, 1:21 PM

## 2020-06-14 NOTE — Progress Notes (Signed)
PROGRESS NOTE  Christy Wade TKP:[id number] DOB: 1936-07-18 DOA: 06/10/2020 PCP: Luetta Nutting, DO   LOS: 4 days   Brief Narrative / Interim history: 84 year old female with GAD, hypertension, hypothyroidism, anemia, osteoporosis was brought to the hospital due to hallucinations that started over 24 hours prior to admission.  Apparently over the last 4 to 5 months she has been getting recurrent UTIs associated with significant weakness and several falls.  Prior cultures showed Klebsiella in August, E. coli in October, most recently Enterobacter in November.  She was seen in the ED few days ago for which she was prescribed Levaquin, hallucinations started a day afterwards.  She was hypothermic in the ED with borderline low blood pressure, she was found to have elevated lactic acid and admitted to the hospital  Subjective / 24h Interval events: Has been having bilateral arm tingling for the past couple of days  Assessment & Plan: Principal Problem Acute metabolic encephalopathy with hallucinations-possibly due to fluoroquinolone.  No focal deficits. CT head done a few days ago did not show any acute findings, however given recurrent falls and decline I have obtained an MRI which was also negative for acute findings -Cultures have remained negative.  Completed antibiotics.  Active Problems Sepsis due to unspecified organism/hypothermia -Possibly due to UTI, chest x-ray also raise possibility for pneumonia.  Cultures have remained negative.  She was treated with 3 days of Levaquin and 4 days of cefepime for total of 7 days, unknown source.  CT scan of the chest did not confirm pneumonia but did show pleural effusions which can explain her hypoxia  Recurrent UTIs -Reason not entirely clear, CT scan of the abdomen without any significant findings to explain recurrent UTIs  Acute systolic CHF -2D echo done 11/25 shows an EF of 30-35% which is significantly lower than the one done 2 months ago.   Cardiology consulted, appreciate input.  Suspect this is contributing to her recurrent weakness at home and falls.  For now diuresing  Acute hypoxic respiratory failure -Likely in the setting of pleural effusions in the setting of new onset systolic CHF.  Tolerating IV Lasix, renal function is stable, continue  Abnormal imaging of the right shoulder-x-ray shows large area of cortical lucency in the medial aspect of the right humeral head, unclear if new or chronic.  Apparently Dr. Griffin Basil was aware of this back in August, based on PCP notes it appears that patient is not a surgical candidate.  Outpatient follow-up  Rib fractures on the right-likely due to previous falls, no significant pain.  Possibly causing atelectasis on chest x-ray  Essential hypertension-hold home medications due to hypotension on admission, monitor blood pressure, currently normal  Hypothyroidism-continue Synthroid.  TSH is normal at 3.15  Normocytic anemia- Some drop in hemoglobin noted compared to her baseline which seems to be around 11.  No overt bleeding noted.  Anemia panel with slightly low iron levels  Hyponatremia and hypokalemia -hyponatremia has a chronic component, back in August it was 111.  Thought to be due to volume depletion back then perhaps a component of SIADH.  Sodium has remained stable. Replete potassium  Scheduled Meds: . cycloSPORINE  1 drop Both Eyes BID  . enoxaparin (LOVENOX) injection  40 mg Subcutaneous Q24H  . feeding supplement  237 mL Oral BID BM  . furosemide  40 mg Intravenous Daily  . influenza vaccine adjuvanted  0.5 mL Intramuscular Tomorrow-1000  . levothyroxine  88 mcg Oral QAC breakfast  . multivitamin with minerals  1 tablet Oral Daily  . pantoprazole  40 mg Oral Daily  . potassium chloride  40 mEq Oral Daily  . saccharomyces boulardii  250 mg Oral BID   Continuous Infusions:  PRN Meds:.acetaminophen **OR** acetaminophen, hydrALAZINE, ondansetron **OR** ondansetron  (ZOFRAN) IV  Diet Orders (From admission, onward)    Start     Ordered   06/12/20 1644  DIET SOFT Room service appropriate? Yes; Fluid consistency: Thin  Diet effective now       Question Answer Comment  Room service appropriate? Yes   Fluid consistency: Thin      06/12/20 1643          DVT prophylaxis: enoxaparin (LOVENOX) injection 40 mg Start: 06/10/20 2200     Code Status: DNR  Family Communication: daughter over the phone  Status is: Inpatient  Remains inpatient appropriate because:IV treatments appropriate due to intensity of illness or inability to take PO and Inpatient level of care appropriate due to severity of illness   Dispo: The patient is from: Home              Anticipated d/c is to: SNF              Anticipated d/c date is: 2 days              Patient currently is not medically stable to d/c.  Consultants:  None   Procedures:  None   Microbiology  Cultures pending   Antimicrobials: Cefepime 11/23 >>    Objective: Vitals:   06/13/20 1305 06/13/20 1900 06/14/20 0435 06/14/20 1022  BP: 121/63 108/60 133/66 128/69  Pulse: 85 91 81 79  Resp: 20  18 20   Temp: 97.7 F (36.5 C) 99 F (37.2 C) 97.7 F (36.5 C) 98.1 F (36.7 C)  TempSrc: Oral Oral Oral Oral  SpO2: 100% 100% 100% 99%  Weight:   56.5 kg   Height:        Intake/Output Summary (Last 24 hours) at 06/14/2020 1124 Last data filed at 06/14/2020 1000 Gross per 24 hour  Intake 1453.97 ml  Output 1200 ml  Net 253.97 ml   Filed Weights   06/10/20 2038 06/14/20 0435  Weight: 57.8 kg 56.5 kg    Examination:  Constitutional: No distress Eyes: No scleral icterus ENMT: Moist mucous membranes Neck: normal, supple Respiratory: Clear bilaterally, diminished at the bases but no wheezing or crackles Cardiovascular: Regular rate and rhythm, no murmurs rubs or gallops, trace edema Abdomen: Nondistended, bowel sounds positive Musculoskeletal: no clubbing / cyanosis.  Skin: No rashes  appreciated Neurologic: Nonfocal, equal strength  Data Reviewed: I have independently reviewed following labs and imaging studies   CBC: Recent Labs  Lab 06/10/20 1234 06/11/20 0507 06/12/20 0409 06/13/20 0449 06/14/20 0445  WBC 5.5 7.5 6.5 5.7 5.5  NEUTROABS 4.1  --   --   --   --   HGB 9.3* 8.4* 8.1* 8.3* 9.1*  HCT 28.9* 27.2* 25.6* 27.4* 30.7*  MCV 91.2 95.1 93.1 96.1 97.8  PLT 325 291 264 242 361   Basic Metabolic Panel: Recent Labs  Lab 06/10/20 1234 06/11/20 0507 06/12/20 0409 06/13/20 0449 06/14/20 0445  NA 133* 133* 133* 133* 136  K 3.0* 4.0 3.3* 3.6 4.2  CL 94* 99 99 97* 96*  CO2 30 24 28 28 31   GLUCOSE 107* 92 95 87 93  BUN 14 13 11 12 14   CREATININE 0.86 0.81 0.88 0.97 0.79  CALCIUM 8.5* 8.1* 7.6* 7.6* 8.0*  MG 1.8 1.7  --   --   --    Liver Function Tests: Recent Labs  Lab 06/10/20 1234 06/11/20 0507 06/12/20 0409 06/13/20 0449 06/14/20 0445  AST 26 20 19 20 26   ALT 18 17 16 14 18   ALKPHOS 81 75 70 70 73  BILITOT 0.8 0.5 0.5 0.5 0.3  PROT 6.2* 5.5* 5.4* 5.3* 5.6*  ALBUMIN 2.9* 2.5* 2.3* 2.1* 2.3*   Coagulation Profile: Recent Labs  Lab 06/10/20 1234 06/11/20 0507  INR 1.2 1.3*   HbA1C: No results for input(s): HGBA1C in the last 72 hours. CBG: No results for input(s): GLUCAP in the last 168 hours.  Recent Results (from the past 240 hour(s))  Blood Culture (routine x 2)     Status: None   Collection Time: 06/06/20  2:52 PM   Specimen: BLOOD  Result Value Ref Range Status   Specimen Description   Final    BLOOD RIGHT ANTECUBITAL Performed at [facility] [address], [place], [place] [address]    Special Requests   Final    BOTTLES DRAWN AEROBIC AND ANAEROBIC Blood Culture adequate volume Performed at [facility] [address], [place], [place] [address]    Culture   Final    NO GROWTH 5 DAYS Performed at [facility], [place] [address] [place], [place] [address]    Report  Status 06/11/2020 FINAL  Final  Blood Culture (routine x 2)     Status: None   Collection Time: 06/06/20  2:52 PM   Specimen: BLOOD  Result Value Ref Range Status   Specimen Description   Final    BLOOD LEFT ANTECUBITAL Performed at [facility] [address], [place], [place] [address]    Special Requests   Final    BOTTLES DRAWN AEROBIC AND ANAEROBIC Blood Culture adequate volume Performed at [facility] [address], [place], [place] [address]    Culture   Final    NO GROWTH 5 DAYS Performed at [facility], [place] [address], [place], [place] [address]    Report Status 06/11/2020 FINAL  Final  Urine culture     Status: Abnormal   Collection Time: 06/06/20  4:47 PM   Specimen: In/Out Cath Urine  Result Value Ref Range Status   Specimen Description   Final    IN/OUT CATH URINE Performed at [facility] [address] [place], [place] [address]    Special Requests   Final    NONE Performed at [facility], [place] [address], [place], [place] [address]    Culture 20,000 COLONIES/mL STAPHYLOCOCCUS HOMINIS (A)  Final   Report Status 06/09/2020 FINAL  Final   Organism ID, Bacteria STAPHYLOCOCCUS HOMINIS (A)  Final      Susceptibility   Staphylococcus hominis - MIC*    CIPROFLOXACIN >=8 RESISTANT Resistant     GENTAMICIN <=0.5 SENSITIVE Sensitive     NITROFURANTOIN <=16 SENSITIVE Sensitive     OXACILLIN >=4 RESISTANT Resistant     TETRACYCLINE >=16 RESISTANT Resistant     VANCOMYCIN 2 SENSITIVE Sensitive     TRIMETH/SULFA 40 SENSITIVE Sensitive     CLINDAMYCIN >=8 RESISTANT Resistant     RIFAMPIN <=0.5 SENSITIVE Sensitive     Inducible Clindamycin NEGATIVE Sensitive     * 20,000 COLONIES/mL STAPHYLOCOCCUS HOMINIS  Culture, blood (routine x 2)     Status: None (Preliminary result)   Collection Time: 06/10/20  12:34 PM   Specimen: BLOOD  Result Value Ref Range Status   Specimen Description    Final    BLOOD LEFT WRIST Performed at [facility] [address] [place], [place] [address]    Special Requests   Final    BOTTLES DRAWN AEROBIC AND ANAEROBIC Blood Culture adequate volume Performed at [facility] [address], [place], [place] [address]    Culture   Final    NO GROWTH 4 DAYS Performed at [facility], [place] [address], [place], [place] [address]    Report Status PENDING  Incomplete  Culture, blood (routine x 2)     Status: None (Preliminary result)   Collection Time: 06/10/20 12:34 PM   Specimen: BLOOD  Result Value Ref Range Status   Specimen Description   Final    BLOOD RIGHT ANTECUBITAL Performed at [facility] [address] [place], [place] [address]    Special Requests   Final    BOTTLES DRAWN AEROBIC AND ANAEROBIC Blood Culture results may not be optimal due to an excessive volume of blood received in culture bottles Performed at [facility] [address], [place], [place] [address]    Culture   Final    NO GROWTH 4 DAYS Performed at [facility], [place] [address], [place], [place] [address]    Report Status PENDING  Incomplete  Resp Panel by RT-PCR (Flu A&B, Covid) Nasopharyngeal Swab     Status: None   Collection Time: 06/10/20  4:57 PM   Specimen: Nasopharyngeal Swab; Nasopharyngeal(NP) swabs in vial transport medium  Result Value Ref Range Status   SARS Coronavirus 2 by RT PCR NEGATIVE NEGATIVE Final    Comment: (NOTE) SARS-CoV-2 target nucleic acids are NOT DETECTED.  The SARS-CoV-2 RNA is generally detectable in upper respiratory specimens during the acute phase of infection. The lowest concentration of SARS-CoV-2 viral copies this assay can detect is 138 copies/mL. A negative result does not preclude SARS-Cov-2 infection and should not be used as the sole basis for treatment or other patient management decisions. A negative result may occur with   improper specimen collection/handling, submission of specimen other than nasopharyngeal swab, presence of viral mutation(s) within the areas targeted by this assay, and inadequate number of viral copies(<138 copies/mL). A negative result must be combined with clinical observations, patient history, and epidemiological information. The expected result is Negative.  Fact Sheet for Patients:  EntrepreneurPulse.com.au  Fact Sheet for Healthcare Providers:  IncredibleEmployment.be  This test is no t yet approved or cleared by the [place] FDA and  has been authorized for detection and/or diagnosis of SARS-CoV-2 by FDA under an Emergency Use Authorization (EUA). This EUA will remain  in effect (meaning this test can be used) for the duration of the COVID-19 declaration under Section 564(b)(1) of the Act, 21 U.S.C.section 360bbb-3(b)(1), unless the authorization is terminated  or revoked sooner.       Influenza A by PCR NEGATIVE NEGATIVE Final   Influenza B by PCR NEGATIVE NEGATIVE Final    Comment: (NOTE) The Xpert Xpress SARS-CoV-2/FLU/RSV plus assay is intended as an aid in the diagnosis of influenza from Nasopharyngeal swab specimens and should not be used as a sole basis for treatment. Nasal washings and aspirates are unacceptable for Xpert Xpress SARS-CoV-2/FLU/RSV testing.  Fact Sheet for Patients: EntrepreneurPulse.com.au  Fact Sheet for Healthcare Providers: IncredibleEmployment.be  This test is not yet approved or cleared by the  [place] Architectural technologist and has been authorized for detection and/or diagnosis of SARS-CoV-2 by FDA under an Print production planner (EUA). This EUA will remain in effect (meaning this test can be used) for the duration of the COVID-19 declaration under Section 564(b)(1) of the Act, 21 U.S.C. section 360bbb-3(b)(1), unless the authorization is terminated  or revoked.  Performed at [facility], [place] [address], [place], [place] [address]   Urine culture     Status: None   Collection Time: 06/10/20  5:15 PM   Specimen: Urine, Clean Catch  Result Value Ref Range Status   Specimen Description   Final    URINE, CLEAN CATCH Performed at [facility], [place] [address], [place], [place] [address]    Special Requests   Final    NONE Performed at [facility] [place], [place] [address] [place], [place] [address]    Culture   Final    NO GROWTH Performed at [facility], [place] [address], [place],  [address]    Report Status 06/12/2020 FINAL  Final     Radiology Studies: No results found.   Marzetta Board, MD, PhD [facility]  Between 7 am - 7 pm I am available, please contact me via Amion or Securechat  Between 7 pm - 7 am I am not available, please contact night coverage MD/APP via Amion

## 2020-06-15 DIAGNOSIS — I10 Essential (primary) hypertension: Secondary | ICD-10-CM | POA: Diagnosis not present

## 2020-06-15 DIAGNOSIS — I5043 Acute on chronic combined systolic (congestive) and diastolic (congestive) heart failure: Secondary | ICD-10-CM | POA: Diagnosis not present

## 2020-06-15 DIAGNOSIS — I5021 Acute systolic (congestive) heart failure: Secondary | ICD-10-CM | POA: Diagnosis not present

## 2020-06-15 DIAGNOSIS — N390 Urinary tract infection, site not specified: Secondary | ICD-10-CM | POA: Diagnosis not present

## 2020-06-15 DIAGNOSIS — I447 Left bundle-branch block, unspecified: Secondary | ICD-10-CM | POA: Diagnosis not present

## 2020-06-15 LAB — COMPREHENSIVE METABOLIC PANEL
ALT: 16 U/L (ref 0–44)
AST: 23 U/L (ref 15–41)
Albumin: 2.2 g/dL — ABNORMAL LOW (ref 3.5–5.0)
Alkaline Phosphatase: 78 U/L (ref 38–126)
Anion gap: 9 (ref 5–15)
BUN: 13 mg/dL (ref 8–23)
CO2: 33 mmol/L — ABNORMAL HIGH (ref 22–32)
Calcium: 8.1 mg/dL — ABNORMAL LOW (ref 8.9–10.3)
Chloride: 92 mmol/L — ABNORMAL LOW (ref 98–111)
Creatinine, Ser: 0.82 mg/dL (ref 0.44–1.00)
GFR, Estimated: >60 mL/min (ref >60)
Glucose, Bld: 83 mg/dL (ref 70–99)
Potassium: 3.5 mmol/L (ref 3.5–5.1)
Sodium: 134 mmol/L — ABNORMAL LOW (ref 135–145)
Total Bilirubin: 0.4 mg/dL (ref 0.3–1.2)
Total Protein: 5.5 g/dL — ABNORMAL LOW (ref 6.5–8.1)

## 2020-06-15 LAB — CBC
HCT: 29 % — ABNORMAL LOW (ref 36.0–46.0)
Hemoglobin: 9 g/dL — ABNORMAL LOW (ref 12.0–15.0)
MCH: 29 pg (ref 26.0–34.0)
MCHC: 31 g/dL (ref 30.0–36.0)
MCV: 93.5 fL (ref 80.0–100.0)
Platelets: 231 10*3/uL (ref 150–400)
RBC: 3.1 MIL/uL — ABNORMAL LOW (ref 3.87–5.11)
RDW: 15.1 % (ref 11.5–15.5)
WBC: 5.2 10*3/uL (ref 4.0–10.5)
nRBC: 0 % (ref 0.0–0.2)

## 2020-06-15 LAB — CULTURE, BLOOD (ROUTINE X 2)
Culture: NO GROWTH
Culture: NO GROWTH
Special Requests: ADEQUATE

## 2020-06-15 LAB — FERRITIN: Ferritin: 141 ng/mL (ref 11–307)

## 2020-06-15 LAB — FOLATE: Folate: 6.8 ng/mL (ref >5.9)

## 2020-06-15 LAB — IRON AND TIBC
Iron: 26 ug/dL — ABNORMAL LOW (ref 28–170)
Saturation Ratios: 15 % (ref 10.4–31.8)
TIBC: 173 ug/dL — ABNORMAL LOW (ref 250–450)
UIBC: 147 ug/dL

## 2020-06-15 LAB — RETICULOCYTES
Immature Retic Fract: 11.6 % (ref 2.3–15.9)
RBC.: 3.1 MIL/uL — ABNORMAL LOW (ref 3.87–5.11)
Retic Count, Absolute: 59.2 10*3/uL (ref 19.0–186.0)
Retic Ct Pct: 1.9 % (ref 0.4–3.1)

## 2020-06-15 LAB — VITAMIN B12: Vitamin B-12: 1107 pg/mL — ABNORMAL HIGH (ref 180–914)

## 2020-06-15 MED ORDER — IPRATROPIUM-ALBUTEROL 0.5-2.5 (3) MG/3ML IN SOLN
3.0000 mL | Freq: Four times a day (QID) | RESPIRATORY_TRACT | Status: DC | PRN
  Administered 2020-06-15: 3 mL via RESPIRATORY_TRACT
  Filled 2020-06-15: qty 3

## 2020-06-15 MED ORDER — LOSARTAN POTASSIUM 25 MG PO TABS
25.0000 mg | ORAL_TABLET | Freq: Every day | ORAL | Status: DC
  Administered 2020-06-15 – 2020-06-17 (×3): 25 mg via ORAL
  Filled 2020-06-15 (×3): qty 1

## 2020-06-15 NOTE — Progress Notes (Signed)
PROGRESS NOTE  Christy Wade CXK:[id number] DOB: 05-Aug-1935 DOA: 06/10/2020 PCP: Luetta Nutting, DO   LOS: 5 days   Brief Narrative / Interim history: 84 year old female with GAD, hypertension, hypothyroidism, anemia, osteoporosis was brought to the hospital due to hallucinations that started over 24 hours prior to admission.  Apparently over the last 4 to 5 months she has been getting recurrent UTIs associated with significant weakness and several falls.  Prior cultures showed Klebsiella in August, E. coli in October, most recently Enterobacter in November.  She was seen in the ED few days ago for which she was prescribed Levaquin, hallucinations started a day afterwards.  She was hypothermic in the ED with borderline low blood pressure, she was found to have elevated lactic acid and admitted to the hospital  Subjective / 24h Interval events: Overall she is feeling well, concerned about how weak she is  Assessment & Plan: Principal Problem Acute metabolic encephalopathy with hallucinations-possibly due to fluoroquinolone.  No focal deficits. CT head done a few days ago did not show any acute findings, however given recurrent falls and decline I have obtained an MRI which was also negative for acute findings -Cultures have remained negative.  Completed antibiotics.  Active Problems Sepsis due to unspecified organism/hypothermia -Possibly due to UTI, chest x-ray also raise possibility for pneumonia.  Cultures have remained negative.  She was treated with 3 days of Levaquin and 4 days of cefepime for total of 7 days, unknown source.  CT scan of the chest did not confirm pneumonia but did show pleural effusions which can explain her hypoxia  Recurrent UTIs -Reason not entirely clear, CT scan of the abdomen without any significant findings to explain recurrent UTIs.  Monitor clinically for now  Acute systolic CHF -2D echo done 11/25 shows an EF of 30-35% which is significantly lower than the  one done 2 months ago.  Cardiology consulted, appreciate input.  Suspect this is contributing to her recurrent weakness at home and falls.  For now diuresing, continue IV Lasix, renal function is stable  Acute hypoxic respiratory failure -Likely in the setting of pleural effusions in the setting of new onset systolic CHF.  Tolerating IV Lasix, only on 1 L today, attempt to wean to room air  Abnormal imaging of the right shoulder-x-ray shows large area of cortical lucency in the medial aspect of the right humeral head, unclear if new or chronic.  Apparently Dr. Griffin Basil was aware of this back in August, based on PCP notes it appears that patient is not a surgical candidate.  Outpatient follow-up  Rib fractures on the right-likely due to previous falls, no significant pain.  Possibly causing atelectasis on chest x-ray  Essential hypertension-currently on IV Lasix, losartan added by cardiology for her CHF  Hypothyroidism-continue Synthroid.  TSH is normal at 3.15  Normocytic anemia- Some drop in hemoglobin noted compared to her baseline which seems to be around 11.  No overt bleeding noted.  Anemia panel with slightly low iron levels  Hyponatremia and hypokalemia -hyponatremia has a chronic component, back in August it was 111.  Thought to be due to volume depletion back then perhaps a component of SIADH.  Sodium has remained stable. Replete potassium  Disposition-patient initially reluctant to felt SNF but now more agreeable realizing she cannot manage her weakness at home.  Scheduled Meds:  cycloSPORINE  1 drop Both Eyes BID   enoxaparin (LOVENOX) injection  40 mg Subcutaneous Q24H   feeding supplement  237 mL Oral BID  BM   furosemide  40 mg Intravenous Daily   influenza vaccine adjuvanted  0.5 mL Intramuscular Tomorrow-1000   levothyroxine  88 mcg Oral QAC breakfast   losartan  25 mg Oral Daily   multivitamin with minerals  1 tablet Oral Daily   pantoprazole  40 mg Oral Daily    potassium chloride  40 mEq Oral Daily   saccharomyces boulardii  250 mg Oral BID   Continuous Infusions:  PRN Meds:.acetaminophen **OR** acetaminophen, hydrALAZINE, ondansetron **OR** ondansetron (ZOFRAN) IV  Diet Orders (From admission, onward)    Start     Ordered   06/12/20 1644  DIET SOFT Room service appropriate? Yes; Fluid consistency: Thin  Diet effective now       Question Answer Comment  Room service appropriate? Yes   Fluid consistency: Thin      06/12/20 1643          DVT prophylaxis: enoxaparin (LOVENOX) injection 40 mg Start: 06/10/20 2200     Code Status: DNR  Family Communication: daughter over the phone  Status is: Inpatient  Remains inpatient appropriate because:IV treatments appropriate due to intensity of illness or inability to take PO and Inpatient level of care appropriate due to severity of illness   Dispo: The patient is from: Home              Anticipated d/c is to: SNF              Anticipated d/c date is: 2 days              Patient currently is not medically stable to d/c.  Consultants:  None   Procedures:  None   Microbiology  Cultures pending   Antimicrobials: Cefepime 11/23 >> 11/27   Objective: Vitals:   06/14/20 2123 06/15/20 0204 06/15/20 0500 06/15/20 0653  BP: 129/69 134/70  136/69  Pulse: 85 87  81  Resp: 18 18  18   Temp: 98.4 F (36.9 C) 98.5 F (36.9 C)  98.4 F (36.9 C)  TempSrc: Oral Oral  Oral  SpO2: 100% 99%  100%  Weight:   53.4 kg   Height:        Intake/Output Summary (Last 24 hours) at 06/15/2020 1027 Last data filed at 06/15/2020 0930 Gross per 24 hour  Intake 300 ml  Output 350 ml  Net -50 ml   Filed Weights   06/10/20 2038 06/14/20 0435 06/15/20 0500  Weight: 57.8 kg 56.5 kg 53.4 kg    Examination:  Constitutional: No distress Eyes: No scleral icterus ENMT: Moist mucous membranes Neck: normal, supple Respiratory: Diminished at the bases but overall clear without  wheezing Cardiovascular: Regular, no murmurs, trace edema Abdomen: Soft, nontender, nondistended, bowel sounds positive Musculoskeletal: no clubbing / cyanosis.  Skin: No rashes seen Neurologic: No focal deficits  Data Reviewed: I have independently reviewed following labs and imaging studies   CBC: Recent Labs  Lab 06/10/20 1234 06/10/20 1234 06/11/20 0507 06/12/20 0409 06/13/20 0449 06/14/20 0445 06/15/20 0553  WBC 5.5   < > 7.5 6.5 5.7 5.5 5.2  NEUTROABS 4.1  --   --   --   --   --   --   HGB 9.3*   < > 8.4* 8.1* 8.3* 9.1* 9.0*  HCT 28.9*   < > 27.2* 25.6* 27.4* 30.7* 29.0*  MCV 91.2   < > 95.1 93.1 96.1 97.8 93.5  PLT 325   < > 291 264 242 226 231   < > =  values in this interval not displayed.   Basic Metabolic Panel: Recent Labs  Lab 06/10/20 1234 06/10/20 1234 06/11/20 0507 06/12/20 0409 06/13/20 0449 06/14/20 0445 06/15/20 0553  NA 133*   < > 133* 133* 133* 136 134*  K 3.0*   < > 4.0 3.3* 3.6 4.2 3.5  CL 94*   < > 99 99 97* 96* 92*  CO2 30   < > 24 28 28 31  33*  GLUCOSE 107*   < > 92 95 87 93 83  BUN 14   < > 13 11 12 14 13   CREATININE 0.86   < > 0.81 0.88 0.97 0.79 0.82  CALCIUM 8.5*   < > 8.1* 7.6* 7.6* 8.0* 8.1*  MG 1.8  --  1.7  --   --   --   --    < > = values in this interval not displayed.   Liver Function Tests: Recent Labs  Lab 06/11/20 0507 06/12/20 0409 06/13/20 0449 06/14/20 0445 06/15/20 0553  AST 20 19 20 26 23   ALT 17 16 14 18 16   ALKPHOS 75 70 70 73 78  BILITOT 0.5 0.5 0.5 0.3 0.4  PROT 5.5* 5.4* 5.3* 5.6* 5.5*  ALBUMIN 2.5* 2.3* 2.1* 2.3* 2.2*   Coagulation Profile: Recent Labs  Lab 06/10/20 1234 06/11/20 0507  INR 1.2 1.3*   HbA1C: No results for input(s): HGBA1C in the last 72 hours. CBG: No results for input(s): GLUCAP in the last 168 hours.  Recent Results (from the past 240 hour(s))  Blood Culture (routine x 2)     Status: None   Collection Time: 06/06/20  2:52 PM   Specimen: BLOOD  Result Value Ref Range  Status   Specimen Description   Final    BLOOD RIGHT ANTECUBITAL Performed at [facility] [address] [place], [place] [address]    Special Requests   Final    BOTTLES DRAWN AEROBIC AND ANAEROBIC Blood Culture adequate volume Performed at [facility] [address] [employer], [place] [address]    Culture   Final    NO GROWTH 5 DAYS Performed at [facility], [place] [address] [place], [place] [address]    Report Status 06/11/2020 FINAL  Final  Blood Culture (routine x 2)     Status: None   Collection Time: 06/06/20  2:52 PM   Specimen: BLOOD  Result Value Ref Range Status   Specimen Description   Final    BLOOD LEFT ANTECUBITAL Performed at [facility] [address], [place], [place] [address]    Special Requests   Final    BOTTLES DRAWN AEROBIC AND ANAEROBIC Blood Culture adequate volume Performed at [facility] [address], [place], [place] [address]    Culture   Final    NO GROWTH 5 DAYS Performed at [facility], [place] [address], [place], [place] [address]    Report Status 06/11/2020 FINAL  Final  Urine culture     Status: Abnormal   Collection Time: 06/06/20  4:47 PM   Specimen: In/Out Cath Urine  Result Value Ref Range Status   Specimen Description   Final    IN/OUT CATH URINE Performed at [facility] [address], [place], [place] [address]    Special Requests   Final    NONE Performed at [facility], [place] [address], [place], [place] [address]    Culture 20,000  COLONIES/mL STAPHYLOCOCCUS HOMINIS (A)  Final   Report Status 06/09/2020 FINAL  Final   Organism ID, Bacteria STAPHYLOCOCCUS HOMINIS (A)  Final      Susceptibility   Staphylococcus hominis - MIC*    CIPROFLOXACIN >=8 RESISTANT Resistant     GENTAMICIN <=0.5 SENSITIVE Sensitive     NITROFURANTOIN <=16 SENSITIVE Sensitive     OXACILLIN >=4 RESISTANT Resistant      TETRACYCLINE >=16 RESISTANT Resistant     VANCOMYCIN 2 SENSITIVE Sensitive     TRIMETH/SULFA 40 SENSITIVE Sensitive     CLINDAMYCIN >=8 RESISTANT Resistant     RIFAMPIN <=0.5 SENSITIVE Sensitive     Inducible Clindamycin NEGATIVE Sensitive     * 20,000 COLONIES/mL STAPHYLOCOCCUS HOMINIS  Culture, blood (routine x 2)     Status: None   Collection Time: 06/10/20 12:34 PM   Specimen: BLOOD  Result Value Ref Range Status   Specimen Description   Final    BLOOD LEFT WRIST Performed at [facility] [address], [place], [place] [address]    Special Requests   Final    BOTTLES DRAWN AEROBIC AND ANAEROBIC Blood Culture adequate volume Performed at [facility] [address] [place], [place] [address]    Culture   Final    NO GROWTH 5 DAYS Performed at [facility], [place] [address], [place], [place] [address]    Report Status 06/15/2020 FINAL  Final  Culture, blood (routine x 2)     Status: None   Collection Time: 06/10/20 12:34 PM   Specimen: BLOOD  Result Value Ref Range Status   Specimen Description   Final    BLOOD RIGHT ANTECUBITAL Performed at [facility] [address] [place], [place] [address]    Special Requests   Final    BOTTLES DRAWN AEROBIC AND ANAEROBIC Blood Culture results may not be optimal due to an excessive volume of blood received in culture bottles Performed at [facility] [address], [place], [place] [address]    Culture   Final    NO GROWTH 5 DAYS Performed at [facility], [place] [address] [place], [place] [address]    Report Status 06/15/2020 FINAL  Final  Resp Panel by RT-PCR (Flu A&B, Covid) Nasopharyngeal Swab     Status: None   Collection Time: 06/10/20  4:57 PM   Specimen: Nasopharyngeal Swab; Nasopharyngeal(NP) swabs in vial transport medium  Result Value Ref Range Status   SARS Coronavirus 2 by RT PCR NEGATIVE NEGATIVE Final    Comment:  (NOTE) SARS-CoV-2 target nucleic acids are NOT DETECTED.  The SARS-CoV-2 RNA is generally detectable in upper respiratory specimens during the acute phase of infection. The lowest concentration of SARS-CoV-2 viral copies this assay can detect is 138 copies/mL. A negative result does not preclude SARS-Cov-2 infection and should not be used as the sole basis for treatment or other patient management decisions. A negative result may occur with  improper specimen collection/handling, submission of specimen other than nasopharyngeal swab, presence of viral mutation(s) within the areas targeted by this assay, and inadequate number of viral copies(<138 copies/mL). A negative result must be combined with clinical observations, patient history, and epidemiological information. The expected result is Negative.  Fact Sheet for Patients:  EntrepreneurPulse.com.au  Fact Sheet for Healthcare Providers:  IncredibleEmployment.be  This test is no t yet approved or cleared by the [place] FDA and  has been authorized for detection and/or diagnosis  of SARS-CoV-2 by FDA under an Emergency Use Authorization (EUA). This EUA will remain  in effect (meaning this test can be used) for the duration of the COVID-19 declaration under Section 564(b)(1) of the Act, 21 U.S.C.section 360bbb-3(b)(1), unless the authorization is terminated  or revoked sooner.       Influenza A by PCR NEGATIVE NEGATIVE Final   Influenza B by PCR NEGATIVE NEGATIVE Final    Comment: (NOTE) The Xpert Xpress SARS-CoV-2/FLU/RSV plus assay is intended as an aid in the diagnosis of influenza from Nasopharyngeal swab specimens and should not be used as a sole basis for treatment. Nasal washings and aspirates are unacceptable for Xpert Xpress SARS-CoV-2/FLU/RSV testing.  Fact Sheet for Patients: EntrepreneurPulse.com.au  Fact Sheet for Healthcare  Providers: IncredibleEmployment.be  This test is not yet approved or cleared by the [place] FDA and has been authorized for detection and/or diagnosis of SARS-CoV-2 by FDA under an Emergency Use Authorization (EUA). This EUA will remain in effect (meaning this test can be used) for the duration of the COVID-19 declaration under Section 564(b)(1) of the Act, 21 U.S.C. section 360bbb-3(b)(1), unless the authorization is terminated or revoked.  Performed at [facility], [place] [address], [place], [place] [address]   Urine culture     Status: None   Collection Time: 06/10/20  5:15 PM   Specimen: Urine, Clean Catch  Result Value Ref Range Status   Specimen Description   Final    URINE, CLEAN CATCH Performed at [facility], [place] [address] [place], [place] [address]    Special Requests   Final    NONE Performed at [facility], [place] [address], [place], [place] [address]    Culture   Final    NO GROWTH Performed at [facility], [place] [address] [place], [place] [address]    Report Status 06/12/2020 FINAL  Final     Radiology Studies: No results found.   Marzetta Board, MD, PhD [facility]  Between 7 am - 7 pm I am available, please contact me via Amion or Securechat  Between 7 pm - 7 am I am not available, please contact night coverage MD/APP via Amion
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
## 2020-06-15 NOTE — Progress Notes (Signed)
DAILY PROGRESS NOTE   Patient Name: Christy Wade Date of Encounter: 06/15/2020 Cardiologist: Ena Dawley, MD  Chief Complaint   No complaints  Patient Profile   Christy Wade is a 84 y.o. female with a hx of hypertension, hypothyroidism, LBBB, anemia, osteoporosis, recurrent UTIs who is being seen today for the evaluation of decreased EF at the request of Dr. Cruzita Lederer.  Subjective   Minimally net negative overnight - 500 cc. BP improved. Weight 53.4 kg today.   Objective   Vitals:   06/14/20 2123 06/15/20 0204 06/15/20 0500 06/15/20 0653  BP: 129/69 134/70  136/69  Pulse: 85 87  81  Resp: 18 18  18   Temp: 98.4 F (36.9 C) 98.5 F (36.9 C)  98.4 F (36.9 C)  TempSrc: Oral Oral  Oral  SpO2: 100% 99%  100%  Weight:   53.4 kg   Height:        Intake/Output Summary (Last 24 hours) at 06/15/2020 0946 Last data filed at 06/15/2020 0600 Gross per 24 hour  Intake 240 ml  Output 750 ml  Net -510 ml   Filed Weights   06/10/20 2038 06/14/20 0435 06/15/20 0500  Weight: 57.8 kg 56.5 kg 53.4 kg    Physical Exam   General appearance: alert and no distress Neck: no carotid bruit, no JVD and thyroid not enlarged, symmetric, no tenderness/mass/nodules Lungs: clear to auscultation bilaterally Heart: regular rate and rhythm, S1, S2 normal, no murmur, click, rub or gallop Abdomen: soft, non-tender; bowel sounds normal; no masses,  no organomegaly Extremities: extremities normal, atraumatic, no cyanosis or edema Pulses: 2+ and symmetric Skin: Skin color, texture, turgor normal. No rashes or lesions Neurologic: Grossly normal Psych: Pleasant  Inpatient Medications    Scheduled Meds: . cycloSPORINE  1 drop Both Eyes BID  . enoxaparin (LOVENOX) injection  40 mg Subcutaneous Q24H  . feeding supplement  237 mL Oral BID BM  . furosemide  40 mg Intravenous Daily  . influenza vaccine adjuvanted  0.5 mL Intramuscular Tomorrow-1000  . levothyroxine  88 mcg Oral QAC  breakfast  . multivitamin with minerals  1 tablet Oral Daily  . pantoprazole  40 mg Oral Daily  . potassium chloride  40 mEq Oral Daily  . saccharomyces boulardii  250 mg Oral BID    Continuous Infusions:   PRN Meds: acetaminophen **OR** acetaminophen, hydrALAZINE, ondansetron **OR** ondansetron (ZOFRAN) IV   Labs   Results for orders placed or performed during the hospital encounter of 06/10/20 (from the past 48 hour(s))  Comprehensive metabolic panel     Status: Abnormal   Collection Time: 06/14/20  4:45 AM  Result Value Ref Range   Sodium 136 135 - 145 mmol/L   Potassium 4.2 3.5 - 5.1 mmol/L   Chloride 96 (L) 98 - 111 mmol/L   CO2 31 22 - 32 mmol/L   Glucose, Bld 93 70 - 99 mg/dL    Comment: Glucose reference range applies only to samples taken after fasting for at least 8 hours.   BUN 14 8 - 23 mg/dL   Creatinine, Ser 0.79 0.44 - 1.00 mg/dL   Calcium 8.0 (L) 8.9 - 10.3 mg/dL   Total Protein 5.6 (L) 6.5 - 8.1 g/dL   Albumin 2.3 (L) 3.5 - 5.0 g/dL   AST 26 15 - 41 U/L   ALT 18 0 - 44 U/L   Alkaline Phosphatase 73 38 - 126 U/L   Total Bilirubin 0.3 0.3 - 1.2 mg/dL   GFR,  Estimated >60 >60 mL/min    Comment: (NOTE) Calculated using the CKD-EPI Creatinine Equation (2021)    Anion gap 9 5 - 15    Comment: Performed at [facility], [place] [address], [place], [place] [address]  CBC     Status: Abnormal   Collection Time: 06/14/20  4:45 AM  Result Value Ref Range   WBC 5.5 4.0 - 10.5 K/uL   RBC 3.14 (L) 3.87 - 5.11 MIL/uL   Hemoglobin 9.1 (L) 12.0 - 15.0 g/dL   HCT 30.7 (L) 36 - 46 %   MCV 97.8 80.0 - 100.0 fL   MCH 29.0 26.0 - 34.0 pg   MCHC 29.6 (L) 30.0 - 36.0 g/dL   RDW 15.1 11.5 - 15.5 %   Platelets 226 150 - 400 K/uL   nRBC 0.0 0.0 - 0.2 %    Comment: Performed at [facility], [place] [address] [place], [place] [address]  Vitamin B12     Status: Abnormal   Collection Time: 06/15/20  5:53 AM  Result Value Ref Range   Vitamin  B-12 1,107 (H) 180 - 914 pg/mL    Comment: (NOTE) This assay is not validated for testing neonatal or myeloproliferative syndrome specimens for Vitamin B12 levels. Performed at [facility], [place] [address] [place], [place] [address]   Folate     Status: None   Collection Time: 06/15/20  5:53 AM  Result Value Ref Range   Folate 6.8 >5.9 ng/mL    Comment: Performed at [facility], [place] [address] [place], [place] [address]  Iron and TIBC     Status: Abnormal   Collection Time: 06/15/20  5:53 AM  Result Value Ref Range   Iron 26 (L) 28 - 170 ug/dL   TIBC 173 (L) 250 - 450 ug/dL   Saturation Ratios 15 10.4 - 31.8 %   UIBC 147 ug/dL    Comment: Performed at [facility], [place] [address] [place], [place] [address]  Ferritin     Status: None   Collection Time: 06/15/20  5:53 AM  Result Value Ref Range   Ferritin 141 11 - 307 ng/mL    Comment: Performed at [facility], [place] [address] [place], [place] [address]  Reticulocytes     Status: Abnormal   Collection Time: 06/15/20  5:53 AM  Result Value Ref Range   Retic Ct Pct 1.9 0.4 - 3.1 %   RBC. 3.10 (L) 3.87 - 5.11 MIL/uL   Retic Count, Absolute 59.2 19.0 - 186.0 K/uL   Immature Retic Fract 11.6 2.3 - 15.9 %    Comment: Performed at [facility], [place] [address] [place], [place] [address]  Comprehensive metabolic panel     Status: Abnormal   Collection Time: 06/15/20  5:53 AM  Result Value Ref Range   Sodium 134 (L) 135 - 145 mmol/L   Potassium 3.5 3.5 - 5.1 mmol/L   Chloride 92 (L) 98 - 111 mmol/L   CO2 33 (H) 22 - 32 mmol/L   Glucose, Bld 83 70 - 99 mg/dL    Comment: Glucose reference range applies only to samples taken after fasting for at least 8 hours.   BUN 13 8 - 23 mg/dL   Creatinine, Ser 0.82 0.44 - 1.00 mg/dL   Calcium 8.1 (L) 8.9 - 10.3 mg/dL   Total Protein 5.5 (L) 6.5 - 8.1 g/dL   Albumin 2.2 (L) 3.5 -  5.0 g/dL   AST 23  15 - 41 U/L   ALT 16 0 - 44 U/L   Alkaline Phosphatase 78 38 - 126 U/L   Total Bilirubin 0.4 0.3 - 1.2 mg/dL   GFR, Estimated >60 >60 mL/min    Comment: (NOTE) Calculated using the CKD-EPI Creatinine Equation (2021)    Anion gap 9 5 - 15    Comment: Performed at [facility], [place] [address] [place], [place] [address]  CBC     Status: Abnormal   Collection Time: 06/15/20  5:53 AM  Result Value Ref Range   WBC 5.2 4.0 - 10.5 K/uL   RBC 3.10 (L) 3.87 - 5.11 MIL/uL   Hemoglobin 9.0 (L) 12.0 - 15.0 g/dL   HCT 29.0 (L) 36 - 46 %   MCV 93.5 80.0 - 100.0 fL   MCH 29.0 26.0 - 34.0 pg   MCHC 31.0 30.0 - 36.0 g/dL   RDW 15.1 11.5 - 15.5 %   Platelets 231 150 - 400 K/uL   nRBC 0.0 0.0 - 0.2 %    Comment: Performed at [facility], [place] [address], [place], [place] [address]    ECG   N/A  Telemetry   Sinus rhythm at 82 - Personally Reviewed  Radiology    No results found.  Cardiac Studies   See above  Assessment   Active Problems:   Hypothyroidism   Essential hypertension   Hyponatremia   GAD (generalized anxiety disorder)   Recurrent UTI   Hallucinations   Sepsis ([place])   Hypoxia   Plan   1. Diuresing well - creatinine stable.  Will start low dose losartan for CHF today. Weight is down 3 kg since yesterday. Continue IV lasix today, may be able to switch to po lasix tomorrow.  Time Spent Directly with Patient:  I have spent a total of 25 minutes with the patient reviewing hospital notes, telemetry, EKGs, labs and examining the patient as well as establishing an assessment and plan that was discussed personally with the patient.  > 50% of time was spent in direct patient care.  Length of Stay:  LOS: 5 days   Pixie Casino, MD, [facility] [place] Director of the [facility] Lipid Disorders &  Cardiovascular Risk Reduction [facility] Diplomate of the American Board of Clinical Lipidology Attending  Cardiologist  Direct Dial: [phone]  Fax: [phone]  Website:  [email or web address] Kanika [email or web address] 06/15/2020, 9:46 AM

## 2020-06-16 DIAGNOSIS — I5041 Acute combined systolic (congestive) and diastolic (congestive) heart failure: Secondary | ICD-10-CM | POA: Diagnosis not present

## 2020-06-16 DIAGNOSIS — I5043 Acute on chronic combined systolic (congestive) and diastolic (congestive) heart failure: Secondary | ICD-10-CM

## 2020-06-16 DIAGNOSIS — I447 Left bundle-branch block, unspecified: Secondary | ICD-10-CM | POA: Diagnosis not present

## 2020-06-16 DIAGNOSIS — I420 Dilated cardiomyopathy: Secondary | ICD-10-CM

## 2020-06-16 DIAGNOSIS — I10 Essential (primary) hypertension: Secondary | ICD-10-CM | POA: Diagnosis not present

## 2020-06-16 DIAGNOSIS — N390 Urinary tract infection, site not specified: Secondary | ICD-10-CM | POA: Diagnosis not present

## 2020-06-16 LAB — BASIC METABOLIC PANEL
Anion gap: 9 (ref 5–15)
BUN: 14 mg/dL (ref 8–23)
CO2: 33 mmol/L — ABNORMAL HIGH (ref 22–32)
Calcium: 8.3 mg/dL — ABNORMAL LOW (ref 8.9–10.3)
Chloride: 92 mmol/L — ABNORMAL LOW (ref 98–111)
Creatinine, Ser: 0.95 mg/dL (ref 0.44–1.00)
GFR, Estimated: >60 mL/min (ref >60)
Glucose, Bld: 80 mg/dL (ref 70–99)
Potassium: 3.7 mmol/L (ref 3.5–5.1)
Sodium: 134 mmol/L — ABNORMAL LOW (ref 135–145)

## 2020-06-16 LAB — CBC
HCT: 31.6 % — ABNORMAL LOW (ref 36.0–46.0)
Hemoglobin: 9.8 g/dL — ABNORMAL LOW (ref 12.0–15.0)
MCH: 29 pg (ref 26.0–34.0)
MCHC: 31 g/dL (ref 30.0–36.0)
MCV: 93.5 fL (ref 80.0–100.0)
Platelets: 236 10*3/uL (ref 150–400)
RBC: 3.38 MIL/uL — ABNORMAL LOW (ref 3.87–5.11)
RDW: 15.1 % (ref 11.5–15.5)
WBC: 5.2 10*3/uL (ref 4.0–10.5)
nRBC: 0 % (ref 0.0–0.2)

## 2020-06-16 MED ORDER — METOPROLOL TARTRATE 25 MG PO TABS
12.5000 mg | ORAL_TABLET | Freq: Two times a day (BID) | ORAL | Status: DC
  Administered 2020-06-16 (×2): 12.5 mg via ORAL
  Filled 2020-06-16 (×2): qty 1

## 2020-06-16 MED ORDER — FUROSEMIDE 40 MG PO TABS
40.0000 mg | ORAL_TABLET | Freq: Every day | ORAL | Status: DC
  Administered 2020-06-16 – 2020-06-19 (×4): 40 mg via ORAL
  Filled 2020-06-16 (×4): qty 1

## 2020-06-16 NOTE — Progress Notes (Signed)
Progress Note  Patient Name: Christy Wade Date of Encounter: 06/16/2020  Primary Cardiologist: Christy Dawley, MD  Subjective   Doing ok this morning. Resting   Inpatient Medications    Scheduled Meds:  cycloSPORINE  1 drop Both Eyes BID   enoxaparin (LOVENOX) injection  40 mg Subcutaneous Q24H   feeding supplement  237 mL Oral BID BM   furosemide  40 mg Intravenous Daily   influenza vaccine adjuvanted  0.5 mL Intramuscular Tomorrow-1000   levothyroxine  88 mcg Oral QAC breakfast   losartan  25 mg Oral Daily   multivitamin with minerals  1 tablet Oral Daily   pantoprazole  40 mg Oral Daily   potassium chloride  40 mEq Oral Daily   saccharomyces boulardii  250 mg Oral BID   Continuous Infusions:  PRN Meds: acetaminophen **OR** acetaminophen, hydrALAZINE, ipratropium-albuterol, ondansetron **OR** ondansetron (ZOFRAN) IV   Vital Signs    Vitals:   06/15/20 1737 06/15/20 2004 06/16/20 0456 06/16/20 0600  BP:  117/64 130/64   Pulse:  90 84   Resp:  17 17   Temp:  99.3 F (37.4 C) 98.7 F (37.1 C)   TempSrc:  Oral Oral   SpO2: 98% 95% 94%   Weight:    52.3 kg  Height:        Intake/Output Summary (Last 24 hours) at 06/16/2020 0859 Last data filed at 06/16/2020 0600 Gross per 24 hour  Intake 600 ml  Output 1250 ml  Net -650 ml   Filed Weights   06/14/20 0435 06/15/20 0500 06/16/20 0600  Weight: 56.5 kg 53.4 kg 52.3 kg    Physical Exam   General: Elderly, frail, NAD Neck: Negative for carotid bruits. No JVD Lungs: Diminished in bilateral lower lobes. Breathing is unlabored. Cardiovascular: RRR with S1 S2. No murmur Abdomen: Soft, non-tender, non-distended. No obvious abdominal masses. Extremities: No edema. Radial pulses 2+ bilaterally Neuro: Alert and oriented. No focal deficits. No facial asymmetry. MAE spontaneously. Psych: Responds to questions appropriately with normal affect.    Labs    Chemistry Recent Labs  Lab  06/13/20 0449 06/13/20 0449 06/14/20 0445 06/15/20 0553 06/16/20 0410  NA 133*   < > 136 134* 134*  K 3.6   < > 4.2 3.5 3.7  CL 97*   < > 96* 92* 92*  CO2 28   < > 31 33* 33*  GLUCOSE 87   < > 93 83 80  BUN 12   < > 14 13 14   CREATININE 0.97   < > 0.79 0.82 0.95  CALCIUM 7.6*   < > 8.0* 8.1* 8.3*  PROT 5.3*  --  5.6* 5.5*  --   ALBUMIN 2.1*  --  2.3* 2.2*  --   AST 20  --  26 23  --   ALT 14  --  18 16  --   ALKPHOS 70  --  73 78  --   BILITOT 0.5  --  0.3 0.4  --   GFRNONAA 59*   < > >60 >60 >60  ANIONGAP 8   < > 9 9 9    < > = values in this interval not displayed.     Hematology Recent Labs  Lab 06/11/20 0507 06/14/20 0445 06/15/20 0553 06/16/20 0410  WBC  --  5.5 5.2 5.2  RBC   < > 3.14* 3.10*   3.10* 3.38*  HGB  --  9.1* 9.0* 9.8*  HCT  --  30.7* 29.0* 31.6*  MCV  --  97.8 93.5 93.5  MCH  --  29.0 29.0 29.0  MCHC  --  29.6* 31.0 31.0  RDW  --  15.1 15.1 15.1  PLT  --  226 231 236   < > = values in this interval not displayed.    Cardiac EnzymesNo results for input(s): TROPONINI in the last 168 hours. No results for input(s): TROPIPOC in the last 168 hours.   BNPNo results for input(s): BNP, PROBNP in the last 168 hours.   DDimer  Recent Labs  Lab 06/11/20 1908  DDIMER 4.00*     Radiology    No results found.  Telemetry    06/16/20 NSR- Personally Reviewed  ECG    No new tracing as of 06/16/20 - Personally Reviewed  Cardiac Studies   Echo 06/12/20  1. Left ventricular ejection fraction, by estimation, is 30 to 35%. The  left ventricle has moderately decreased function. The left ventricle  demonstrates global hypokinesis. Left ventricular diastolic parameters are  consistent with Grade I diastolic  dysfunction (impaired relaxation). Elevated left atrial pressure. The  average left ventricular global longitudinal strain is -14.0 %. The global  longitudinal strain is abnormal.  2. Right ventricular systolic function is normal. The right  ventricular  size is normal. There is normal pulmonary artery systolic pressure. The  estimated right ventricular systolic pressure is 28.7 mmHg.  3. The mitral valve is normal in structure. Mild mitral valve  regurgitation. No evidence of mitral stenosis. Moderate mitral annular  calcification.  4. The aortic valve is calcified. There is mild calcification of the  aortic valve. There is mild thickening of the aortic valve. Aortic valve  regurgitation is mild. Mild to moderate aortic valve  sclerosis/calcification is present, without any evidence  of aortic stenosis. Aortic regurgitation PHT measures 374 msec. Aortic  valve mean gradient measures 6.0 mmHg. Aortic valve Vmax measures 1.75  m/s.  5. Aortic dilatation noted. There is mild dilatation of the ascending  aorta, measuring 39 mm.  6. The inferior vena cava is dilated in size with <50% respiratory  variability, suggesting right atrial pressure of 15 mmHg.   Echo 03/25/20 1. Left ventricular ejection fraction, by estimation, is 55 to 60%. The  left ventricle has normal function. The left ventricle has no regional  wall motion abnormalities. Left ventricular diastolic function could not  be evaluated.  2. Right ventricular systolic function is normal. The right ventricular  size is mildly enlarged.  3. The mitral valve is degernerative. There is moderate thickening of the  mitral valve leaflet(s). Normal mobility of the mitral valve leaflets.  Moderate mitral annular calcification.  4. The aortic valve is normal in structure. Aortic valve regurgitation is  mild. No aortic stenosis is present.  5. The inferior vena cava is normal in size with greater than 50%  respiratory variability, suggesting right atrial pressure of 3 mmHg.  Patient Profile     84 y.o. female with a hx of hypertension, hypothyroidism,LBBB,anemia, osteoporosis, recurrent Christy Wade is being seen today for the evaluation of decreased EFat the request  of Dr. Cruzita Lederer.  Assessment & Plan    1. New Cardiomyopathy/Acute on chronic combined heart failure: -Unknown etiology of cardiomyopathy>>admitted for sepsis -Echo this admission showed LVEF 30-35%, G1DD, abnormal global longitudinal strain, mild MR, mild AI, mild aortic dilation 77mm, dilated IVF -No prior ischemic history -GDMT initially deferred due to hypotension however losartan started yesterday, 06/15/20 -Transition to PO lasix today>>>40mg  QD -  Weight, 115lb>>down from 127lb on admission  -I&O, net positive 1.8L  2. HTN: -PTA amlodipine 5mg  and hydralazine 25mg  TID>>held for soft pressures -No amlodipine with low EF -Losartan 25mg  started yesterday>>tolerating well   3. Sepsis/Recurrent UTIs/possible PNA: -CXR with possible PNA -CT abdomen unremarkable -Cultures remain negative  -Abx per IM   4. Respiratory failure -Felt to be secondary to mod-large pleural effusions  -Continue Lasix>> change to PO    Signed, Kathyrn Drown NP-C [facility] Pager: [phone] 06/16/2020, 8:59 AM     For questions or updates, please contact   Please consult [email or web address] for contact info under Cardiology/STEMI.
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
## 2020-06-16 NOTE — Care Management Important Message (Signed)
Important Message  Patient Details IM Letter given to the Patient. Name: Christy Wade MRN: [id number] Date of Birth: 26-Aug-1935   Medicare Important Message Given:  Yes     Kerin Salen 06/16/2020, 10:35 AM

## 2020-06-16 NOTE — NC FL2 (Signed)
[place] LEVEL OF CARE SCREENING TOOL     IDENTIFICATION  Patient Name: Christy Wade Birthdate: Apr 27, 1936 Sex: female Admission Date (Current Location): 06/10/2020  [facility] and [place] Number:  Herbalist and Address:  [facility],  [place], [place]      Provider Number: [id number]  Attending Physician Name and Address:  Caren Griffins, MD  Relative Name and Phone Number:  [facility] Daughter   [phone]    Current Level of Care: Hospital Recommended Level of Care: [place] Prior Approval Number:    Date Approved/Denied:   PASRR Number: [id number]  Discharge Plan: SNF    Current Diagnoses: Patient Active Problem List   Diagnosis Date Noted  . Acute combined systolic and diastolic heart failure ([place])   . DCM (dilated cardiomyopathy) ([place])   . Hallucinations 06/10/2020  . Sepsis ([place]) 06/10/2020  . Hypoxia 06/10/2020  . Protein-calorie malnutrition ([place]) 06/02/2020  . Recurrent UTI 05/27/2020  . GAD (generalized anxiety disorder) 05/14/2020  . Hypokalemia 05/14/2020  . Right shoulder pain 05/14/2020  . Loose stools 05/09/2020  . Dysphagia 05/09/2020  . Hemorrhoids 05/09/2020  . Fatigue 05/05/2020  . CHF (congestive heart failure) ([place]) 03/25/2020  . Respiratory failure ([place]) 03/25/2020  . Multiple fractures of ribs, right side, subsequent encounter for fracture with routine healing 03/06/2020  . Hyponatremia 03/05/2020  . Diarrhea 07/24/2018  . Vaginitis and vulvovaginitis 05/22/2016  . Well adult exam 10/01/2015  . Chronic chest wall pain 09/09/2014  . Mitral valve regurgitation 06/21/2012  . Essential hypertension 01/03/2008  . Osteopenia 03/28/2007  . Hypothyroidism 02/16/2007    Orientation RESPIRATION BLADDER Height & Weight     Self, Time, Situation, Place  Normal Incontinent Weight: 115 lb 4.8 oz (52.3 kg) Height:  5\' 7"  (170.2 cm)  BEHAVIORAL SYMPTOMS/MOOD  NEUROLOGICAL BOWEL NUTRITION STATUS      Continent Diet  AMBULATORY STATUS COMMUNICATION OF NEEDS Skin   Limited Assist Verbally Normal                       Personal Care Assistance Level of Assistance  Bathing, Feeding, Dressing Bathing Assistance: Limited assistance Feeding assistance: Independent Dressing Assistance: Limited assistance     Functional Limitations Info  Sight, Hearing, Speech Sight Info: Adequate Hearing Info: Adequate Speech Info: Adequate    SPECIAL CARE FACTORS FREQUENCY  PT (By licensed PT)     PT Frequency: Minimum 5x a week              Contractures      Additional Factors Info  Code Status, Allergies Code Status Info: DNR Allergies Info: Codeine Sulfamethoxazole           Current Medications (06/16/2020):  This is the current hospital active medication list Current Facility-Administered Medications  Medication Dose Route Frequency Provider Last Rate Last Admin  . acetaminophen (TYLENOL) tablet 650 mg  650 mg Oral Q6H PRN Bonnielee Haff, MD   650 mg at 06/16/20 0500   Or  . acetaminophen (TYLENOL) suppository 650 mg  650 mg Rectal Q6H PRN Bonnielee Haff, MD      . cycloSPORINE (RESTASIS) 0.05 % ophthalmic emulsion 1 drop  1 drop Both Eyes BID Bonnielee Haff, MD   1 drop at 06/16/20 0918  . enoxaparin (LOVENOX) injection 40 mg  40 mg Subcutaneous Q24H Bonnielee Haff, MD   40 mg at 06/15/20 2157  . feeding supplement (ENSURE ENLIVE / ENSURE PLUS) liquid  237 mL  237 mL Oral BID BM Caren Griffins, MD   237 mL at 06/14/20 1109  . furosemide (LASIX) tablet 40 mg  40 mg Oral Daily Kathyrn Drown D, NP   40 mg at 06/16/20 0932  . hydrALAZINE (APRESOLINE) injection 5 mg  5 mg Intravenous Q6H PRN Bonnielee Haff, MD      . influenza vaccine adjuvanted (FLUAD) injection 0.5 mL  0.5 mL Intramuscular Tomorrow-1000 Kieffer, Bridgett D, RN      . ipratropium-albuterol (DUONEB) 0.5-2.5 (3) MG/3ML nebulizer solution 3 mL  3 mL Nebulization Q6H  PRN Caren Griffins, MD   3 mL at 06/15/20 1737  . levothyroxine (SYNTHROID) tablet 88 mcg  88 mcg Oral QAC breakfast Bonnielee Haff, MD   88 mcg at 06/16/20 0500  . losartan (COZAAR) tablet 25 mg  25 mg Oral Daily Pixie Casino, MD   25 mg at 06/16/20 0918  . metoprolol tartrate (LOPRESSOR) tablet 12.5 mg  12.5 mg Oral BID Fransico Him R, MD   12.5 mg at 06/16/20 1354  . multivitamin with minerals tablet 1 tablet  1 tablet Oral Daily Caren Griffins, MD   1 tablet at 06/16/20 0918  . ondansetron (ZOFRAN) tablet 4 mg  4 mg Oral Q6H PRN Bonnielee Haff, MD       Or  . ondansetron [facility]) injection 4 mg  4 mg Intravenous Q6H PRN Bonnielee Haff, MD   4 mg at 06/15/20 1730  . pantoprazole (PROTONIX) EC tablet 40 mg  40 mg Oral Daily Bonnielee Haff, MD   40 mg at 06/16/20 0918  . potassium chloride 20 MEQ/15ML (10%) solution 40 mEq  40 mEq Oral Daily Bonnielee Haff, MD   40 mEq at 06/16/20 0919  . saccharomyces boulardii (FLORASTOR) capsule 250 mg  250 mg Oral BID Bonnielee Haff, MD   250 mg at 06/16/20 5670     Discharge Medications: Please see discharge summary for a list of discharge medications.  Relevant Imaging Results:  Relevant Lab Results:   Additional Information SSN [id number]  Ross Ludwig, LCSW

## 2020-06-16 NOTE — Progress Notes (Signed)
PROGRESS NOTE  Christy Wade WSF:[id number] DOB: 1935/07/21 DOA: 06/10/2020 PCP: Luetta Nutting, DO   LOS: 6 days   Brief Narrative / Interim history: 84 year old female with GAD, hypertension, hypothyroidism, anemia, osteoporosis was brought to the hospital due to hallucinations that started over 24 hours prior to admission.  Apparently over the last 4 to 5 months she has been getting recurrent UTIs associated with significant weakness and several falls.  Prior cultures showed Klebsiella in August, E. coli in October, most recently Enterobacter in November.  She was seen in the ED few days ago for which she was prescribed Levaquin, hallucinations started a day afterwards.  She was hypothermic in the ED with borderline low blood pressure, she was found to have elevated lactic acid and admitted to the hospital  Subjective / 24h Interval events: Feeling better, breathing well  Assessment & Plan: Principal Problem Acute metabolic encephalopathy with hallucinations-possibly due to fluoroquinolone.  No focal deficits. CT head done a few days ago did not show any acute findings, however given recurrent falls and decline I have obtained an MRI which was also negative for acute findings -Cultures have remained negative.  Completed antibiotics.  Active Problems Sepsis due to unspecified organism/hypothermia -Possibly due to UTI, chest x-ray also raise possibility for pneumonia.  Cultures have remained negative.  She was treated with 3 days of Levaquin and 4 days of cefepime for total of 7 days, unknown source.  CT scan of the chest did not confirm pneumonia but did show pleural effusions which can explain her hypoxia  Recurrent UTIs -Reason not entirely clear, CT scan of the abdomen without any significant findings to explain recurrent UTIs.  Monitor clinically for now  Acute systolic CHF -2D echo done 11/25 shows an EF of 30-35% which is significantly lower than the one done 2 months ago.   Cardiology consulted, appreciate input.  Suspect this is contributing to her recurrent weakness at home and falls.  Tolerating diuresis well, on IV Lasix, appreciate cardiology follow-up  Acute hypoxic respiratory failure -Likely in the setting of pleural effusions in the setting of new onset systolic CHF.  Weaned off to room air, appears comfortable  Abnormal imaging of the right shoulder-x-ray shows large area of cortical lucency in the medial aspect of the right humeral head, unclear if new or chronic.  Apparently Dr. Griffin Basil was aware of this back in August, based on PCP notes it appears that patient is not a surgical candidate.  Outpatient follow-up  Rib fractures on the right-likely due to previous falls, no significant pain.  Possibly causing atelectasis on chest x-ray  Essential hypertension-currently on IV Lasix, losartan added by cardiology for her CHF, she seems to be tolerating well her blood pressure is stable this morning  Hypothyroidism-continue Synthroid.  TSH is normal at 3.15  Normocytic anemia- Some drop in hemoglobin noted compared to her baseline which seems to be around 11.  No overt bleeding noted.  Anemia panel with slightly low iron levels  Hyponatremia and hypokalemia -hyponatremia has a chronic component, back in August it was 111.  Thought to be due to volume depletion back then perhaps a component of SIADH.  Sodium has remained stable. Replete potassium  Disposition SNF placement pending  Scheduled Meds: . cycloSPORINE  1 drop Both Eyes BID  . enoxaparin (LOVENOX) injection  40 mg Subcutaneous Q24H  . feeding supplement  237 mL Oral BID BM  . furosemide  40 mg Oral Daily  . influenza vaccine adjuvanted  0.5  mL Intramuscular Tomorrow-1000  . levothyroxine  88 mcg Oral QAC breakfast  . losartan  25 mg Oral Daily  . multivitamin with minerals  1 tablet Oral Daily  . pantoprazole  40 mg Oral Daily  . potassium chloride  40 mEq Oral Daily  . saccharomyces  boulardii  250 mg Oral BID   Continuous Infusions:  PRN Meds:.acetaminophen **OR** acetaminophen, hydrALAZINE, ipratropium-albuterol, ondansetron **OR** ondansetron (ZOFRAN) IV  Diet Orders (From admission, onward)    Start     Ordered   06/12/20 1644  DIET SOFT Room service appropriate? Yes; Fluid consistency: Thin  Diet effective now       Question Answer Comment  Room service appropriate? Yes   Fluid consistency: Thin      06/12/20 1643          DVT prophylaxis: enoxaparin (LOVENOX) injection 40 mg Start: 06/10/20 2200     Code Status: DNR  Family Communication: daughter over the phone  Status is: Inpatient  Remains inpatient appropriate because:IV treatments appropriate due to intensity of illness or inability to take PO and Inpatient level of care appropriate due to severity of illness   Dispo: The patient is from: Home              Anticipated d/c is to: SNF              Anticipated d/c date is: 2 days              Patient currently is not medically stable to d/c.  Consultants:  None   Procedures:  None   Microbiology  Cultures pending   Antimicrobials: Cefepime 11/23 >> 11/27   Objective: Vitals:   06/15/20 1737 06/15/20 2004 06/16/20 0456 06/16/20 0600  BP:  117/64 130/64   Pulse:  90 84   Resp:  17 17   Temp:  99.3 F (37.4 C) 98.7 F (37.1 C)   TempSrc:  Oral Oral   SpO2: 98% 95% 94%   Weight:    52.3 kg  Height:        Intake/Output Summary (Last 24 hours) at 06/16/2020 1102 Last data filed at 06/16/2020 0600 [place] per 24 hour  Intake 540 ml  Output 1250 ml  Net -710 ml   Filed Weights   06/14/20 0435 06/15/20 0500 06/16/20 0600  Weight: 56.5 kg 53.4 kg 52.3 kg    Examination:  Constitutional: NAD, in bed Eyes: No scleral icterus ENMT: Moist mucous membranes Neck: normal, supple Respiratory: Diminished at the bases, clear, no wheezing Cardiovascular: Regular rate and rhythm, no murmurs, trace edema Abdomen: Soft, NT, ND,  bowel sounds positive Musculoskeletal: no clubbing / cyanosis.  Skin: No rash seen Neurologic: Nonfocal, equal strength  Data Reviewed: I have independently reviewed following labs and imaging studies   CBC: Recent Labs  Lab 06/10/20 1234 06/11/20 0507 06/12/20 0409 06/13/20 0449 06/14/20 0445 06/15/20 0553 06/16/20 0410  WBC 5.5   < > 6.5 5.7 5.5 5.2 5.2  NEUTROABS 4.1  --   --   --   --   --   --   HGB 9.3*   < > 8.1* 8.3* 9.1* 9.0* 9.8*  HCT 28.9*   < > 25.6* 27.4* 30.7* 29.0* 31.6*  MCV 91.2   < > 93.1 96.1 97.8 93.5 93.5  PLT 325   < > 264 242 226 231 236   < > = values in this interval not displayed.   Basic Metabolic Panel: Recent Labs  Lab  06/10/20 1234 06/10/20 1234 06/11/20 0507 06/11/20 0507 06/12/20 0409 06/13/20 0449 06/14/20 0445 06/15/20 0553 06/16/20 0410  NA 133*   < > 133*   < > 133* 133* 136 134* 134*  K 3.0*   < > 4.0   < > 3.3* 3.6 4.2 3.5 3.7  CL 94*   < > 99   < > 99 97* 96* 92* 92*  CO2 30   < > 24   < > 28 28 31  33* 33*  GLUCOSE 107*   < > 92   < > 95 87 93 83 80  BUN 14   < > 13   < > 11 12 14 13 14   CREATININE 0.86   < > 0.81   < > 0.88 0.97 0.79 0.82 0.95  CALCIUM 8.5*   < > 8.1*   < > 7.6* 7.6* 8.0* 8.1* 8.3*  MG 1.8  --  1.7  --   --   --   --   --   --    < > = values in this interval not displayed.   Liver Function Tests: Recent Labs  Lab 06/11/20 0507 06/12/20 0409 06/13/20 0449 06/14/20 0445 06/15/20 0553  AST 20 19 20 26 23   ALT 17 16 14 18 16   ALKPHOS 75 70 70 73 78  BILITOT 0.5 0.5 0.5 0.3 0.4  PROT 5.5* 5.4* 5.3* 5.6* 5.5*  ALBUMIN 2.5* 2.3* 2.1* 2.3* 2.2*   Coagulation Profile: Recent Labs  Lab 06/10/20 1234 06/11/20 0507  INR 1.2 1.3*   HbA1C: No results for input(s): HGBA1C in the last 72 hours. CBG: No results for input(s): GLUCAP in the last 168 hours.  Recent Results (from the past 240 hour(s))  Blood Culture (routine x 2)     Status: None   Collection Time: 06/06/20  2:52 PM   Specimen: BLOOD   Result Value Ref Range Status   Specimen Description   Final    BLOOD RIGHT ANTECUBITAL Performed at [facility] [address] [place], [place] [address]    Special Requests   Final    BOTTLES DRAWN AEROBIC AND ANAEROBIC Blood Culture adequate volume Performed at [facility] [address], [place], [place] [address]    Culture   Final    NO GROWTH 5 DAYS Performed at [facility], [place] [address], [place], [place] [address]    Report Status 06/11/2020 FINAL  Final  Blood Culture (routine x 2)     Status: None   Collection Time: 06/06/20  2:52 PM   Specimen: BLOOD  Result Value Ref Range Status   Specimen Description   Final    BLOOD LEFT ANTECUBITAL Performed at [facility] [address], [place], [place] [address]    Special Requests   Final    BOTTLES DRAWN AEROBIC AND ANAEROBIC Blood Culture adequate volume Performed at [facility] [address] [place], [place] [address]    Culture   Final    NO GROWTH 5 DAYS Performed at [facility], [place] [address] [place],  [address]    Report Status 06/11/2020 FINAL  Final  Urine culture     Status: Abnormal   Collection Time: 06/06/20  4:47 PM   Specimen: In/Out Cath Urine  Result Value Ref Range Status   Specimen Description   Final    IN/OUT CATH URINE Performed at [facility]  [address], [place], [place] [address]    Special Requests   Final    NONE Performed at [facility], [place] [address], [place], [place] [address]    Culture 20,000 COLONIES/mL STAPHYLOCOCCUS HOMINIS (A)  Final   Report Status 06/09/2020 FINAL  Final   Organism ID, Bacteria STAPHYLOCOCCUS HOMINIS (A)  Final      Susceptibility   Staphylococcus hominis - MIC*    CIPROFLOXACIN >=8 RESISTANT Resistant     GENTAMICIN <=0.5 SENSITIVE Sensitive     NITROFURANTOIN <=16 SENSITIVE Sensitive     OXACILLIN >=4  RESISTANT Resistant     TETRACYCLINE >=16 RESISTANT Resistant     VANCOMYCIN 2 SENSITIVE Sensitive     TRIMETH/SULFA 40 SENSITIVE Sensitive     CLINDAMYCIN >=8 RESISTANT Resistant     RIFAMPIN <=0.5 SENSITIVE Sensitive     Inducible Clindamycin NEGATIVE Sensitive     * 20,000 COLONIES/mL STAPHYLOCOCCUS HOMINIS  Culture, blood (routine x 2)     Status: None   Collection Time: 06/10/20 12:34 PM   Specimen: BLOOD  Result Value Ref Range Status   Specimen Description   Final    BLOOD LEFT WRIST Performed at [facility] [address], [place], [place] [address]    Special Requests   Final    BOTTLES DRAWN AEROBIC AND ANAEROBIC Blood Culture adequate volume Performed at [facility] [address], [place], [place] [address]    Culture   Final    NO GROWTH 5 DAYS Performed at [facility], [place] [address], [place], [place] [address]    Report Status 06/15/2020 FINAL  Final  Culture, blood (routine x 2)     Status: None   Collection Time: 06/10/20 12:34 PM   Specimen: BLOOD  Result Value Ref Range Status   Specimen Description   Final    BLOOD RIGHT ANTECUBITAL Performed at [facility] [address] [place], [place] [address]    Special Requests   Final    BOTTLES DRAWN AEROBIC AND ANAEROBIC Blood Culture results may not be optimal due to an excessive volume of blood received in culture bottles Performed at [facility] [address], [place], [place] [address]    Culture   Final    NO GROWTH 5 DAYS Performed at [facility], [place] [address] [place], [place] [address]    Report Status 06/15/2020 FINAL  Final  Resp Panel by RT-PCR (Flu A&B, Covid) Nasopharyngeal Swab     Status: None   Collection Time: 06/10/20  4:57 PM   Specimen: Nasopharyngeal Swab; Nasopharyngeal(NP) swabs in vial transport medium  Result Value Ref Range Status   SARS Coronavirus 2 by RT PCR NEGATIVE NEGATIVE  Final    Comment: (NOTE) SARS-CoV-2 target nucleic acids are NOT DETECTED.  The SARS-CoV-2 RNA is generally detectable in upper respiratory specimens during the acute phase of infection. The lowest concentration of SARS-CoV-2 viral copies this assay can detect is 138 copies/mL. A negative result does not preclude SARS-Cov-2 infection and should not be used as the sole basis for treatment or other patient management decisions. A negative result may occur with  improper specimen collection/handling, submission of specimen other than nasopharyngeal swab, presence of viral mutation(s) within the areas targeted by this assay, and inadequate number of viral copies(<138 copies/mL). A negative result must be combined with clinical observations, patient history, and epidemiological information. The expected result is Negative.  Fact Sheet for  Patients:  EntrepreneurPulse.com.au  Fact Sheet for Healthcare Providers:  IncredibleEmployment.be  This test is no t yet approved or cleared by the [place] FDA and  has been authorized for detection and/or diagnosis of SARS-CoV-2 by FDA under an Emergency Use Authorization (EUA). This EUA will remain  in effect (meaning this test can be used) for the duration of the COVID-19 declaration under Section 564(b)(1) of the Act, 21 U.S.C.section 360bbb-3(b)(1), unless the authorization is terminated  or revoked sooner.       Influenza A by PCR NEGATIVE NEGATIVE Final   Influenza B by PCR NEGATIVE NEGATIVE Final    Comment: (NOTE) The Xpert Xpress SARS-CoV-2/FLU/RSV plus assay is intended as an aid in the diagnosis of influenza from Nasopharyngeal swab specimens and should not be used as a sole basis for treatment. Nasal washings and aspirates are unacceptable for Xpert Xpress SARS-CoV-2/FLU/RSV testing.  Fact Sheet for Patients: EntrepreneurPulse.com.au  Fact Sheet for Healthcare  Providers: IncredibleEmployment.be  This test is not yet approved or cleared by the [place] FDA and has been authorized for detection and/or diagnosis of SARS-CoV-2 by FDA under an Emergency Use Authorization (EUA). This EUA will remain in effect (meaning this test can be used) for the duration of the COVID-19 declaration under Section 564(b)(1) of the Act, 21 U.S.C. section 360bbb-3(b)(1), unless the authorization is terminated or revoked.  Performed at [facility], [place] [address] [place], [place] [address]   Urine culture     Status: None   Collection Time: 06/10/20  5:15 PM   Specimen: Urine, Clean Catch  Result Value Ref Range Status   Specimen Description   Final    URINE, CLEAN CATCH Performed at [facility], [place] [address] [place], [place] [address]    Special Requests   Final    NONE Performed at [facility], [place] [address], [place], [place] [address]    Culture   Final    NO GROWTH Performed at [facility], [place] [address] [place], [place] [address]    Report Status 06/12/2020 FINAL  Final     Radiology Studies: No results found.   Marzetta Board, MD, PhD [facility]  Between 7 am - 7 pm I am available, please contact me via Amion or Securechat  Between 7 pm - 7 am I am not available, please contact night coverage MD/APP via Amion

## 2020-06-17 DIAGNOSIS — I5041 Acute combined systolic (congestive) and diastolic (congestive) heart failure: Secondary | ICD-10-CM

## 2020-06-17 DIAGNOSIS — I420 Dilated cardiomyopathy: Secondary | ICD-10-CM

## 2020-06-17 DIAGNOSIS — I471 Supraventricular tachycardia: Secondary | ICD-10-CM

## 2020-06-17 DIAGNOSIS — I10 Essential (primary) hypertension: Secondary | ICD-10-CM | POA: Diagnosis not present

## 2020-06-17 LAB — RESP PANEL BY RT-PCR (FLU A&B, COVID) ARPGX2
Influenza A by PCR: NEGATIVE
Influenza B by PCR: NEGATIVE
SARS Coronavirus 2 by RT PCR: NEGATIVE

## 2020-06-17 LAB — BASIC METABOLIC PANEL
Anion gap: 10 (ref 5–15)
BUN: 13 mg/dL (ref 8–23)
CO2: 31 mmol/L (ref 22–32)
Calcium: 8.4 mg/dL — ABNORMAL LOW (ref 8.9–10.3)
Chloride: 91 mmol/L — ABNORMAL LOW (ref 98–111)
Creatinine, Ser: 0.86 mg/dL (ref 0.44–1.00)
GFR, Estimated: >60 mL/min (ref >60)
Glucose, Bld: 77 mg/dL (ref 70–99)
Potassium: 3.8 mmol/L (ref 3.5–5.1)
Sodium: 132 mmol/L — ABNORMAL LOW (ref 135–145)

## 2020-06-17 MED ORDER — ALPRAZOLAM 0.25 MG PO TABS: 0.2500 mg | ORAL_TABLET | Freq: Every evening | ORAL | 0 refills | Status: DC | PRN

## 2020-06-17 MED ORDER — METOPROLOL SUCCINATE ER 50 MG PO TB24
50.0000 mg | ORAL_TABLET | Freq: Every day | ORAL | Status: DC
Start: 2020-06-18 — End: 2023-03-04

## 2020-06-17 MED ORDER — METOPROLOL SUCCINATE ER 50 MG PO TB24
50.0000 mg | ORAL_TABLET | Freq: Every day | ORAL | Status: DC
  Administered 2020-06-17 – 2020-06-18 (×2): 50 mg via ORAL
  Filled 2020-06-17 (×3): qty 1

## 2020-06-17 MED ORDER — ENTRESTO 24-26 MG PO TABS: 1.0000 | ORAL_TABLET | Freq: Two times a day (BID) | ORAL | Status: DC

## 2020-06-17 NOTE — Progress Notes (Signed)
Progress Note  Patient Name: Christy Wade Date of Encounter: 06/17/2020  Primary Cardiologist: Ena Dawley, MD  Subjective   Denies any chest pain or SOB  Inpatient Medications    Scheduled Meds: . cycloSPORINE  1 drop Both Eyes BID  . enoxaparin (LOVENOX) injection  40 mg Subcutaneous Q24H  . feeding supplement  237 mL Oral BID BM  . furosemide  40 mg Oral Daily  . influenza vaccine adjuvanted  0.5 mL Intramuscular Tomorrow-1000  . levothyroxine  88 mcg Oral QAC breakfast  . losartan  25 mg Oral Daily  . metoprolol tartrate  12.5 mg Oral BID  . multivitamin with minerals  1 tablet Oral Daily  . pantoprazole  40 mg Oral Daily  . potassium chloride  40 mEq Oral Daily  . saccharomyces boulardii  250 mg Oral BID   Continuous Infusions:  PRN Meds: acetaminophen **OR** acetaminophen, hydrALAZINE, ipratropium-albuterol, ondansetron **OR** ondansetron (ZOFRAN) IV   Vital Signs    Vitals:   06/16/20 1232 06/16/20 2127 06/17/20 0443 06/17/20 0446  BP: 121/75 132/69  136/74  Pulse: 85 82  78  Resp: 16 20  20   Temp: 98.3 F (36.8 C) 98.6 F (37 C)  98.2 F (36.8 C)  TempSrc: Oral Oral  Oral  SpO2: 98% 96%  95%  Weight:   53 kg   Height:        Intake/Output Summary (Last 24 hours) at 06/17/2020 0929 Last data filed at 06/17/2020 0443 Gross per 24 hour  Intake 300 ml  Output 700 ml  Net -400 ml   Filed Weights   06/15/20 0500 06/16/20 0600 06/17/20 0443  Weight: 53.4 kg 52.3 kg 53 kg    Physical Exam   GEN: Well nourished, well developed in no acute distress HEENT: Normal NECK: No JVD; No carotid bruits LYMPHATICS: No lymphadenopathy CARDIAC:RRR, no rubs, gallops.  2/6 holosystolic murmur at LLSB>apex and frequent ectopy RESPIRATORY:  Clear to auscultation without rales, wheezing or rhonchi  ABDOMEN: Soft, non-tender, non-distended MUSCULOSKELETAL:  No edema; No deformity  SKIN: Warm and dry NEUROLOGIC:  Alert and oriented x 3 PSYCHIATRIC:   Normal affect    Labs    Chemistry Recent Labs  Lab 06/13/20 0449 06/13/20 0449 06/14/20 0445 06/14/20 0445 06/15/20 0553 06/16/20 0410 06/17/20 0503  NA 133*   < > 136   < > 134* 134* 132*  K 3.6   < > 4.2   < > 3.5 3.7 3.8  CL 97*   < > 96*   < > 92* 92* 91*  CO2 28   < > 31   < > 33* 33* 31  GLUCOSE 87   < > 93   < > 83 80 77  BUN 12   < > 14   < > 13 14 13   CREATININE 0.97   < > 0.79   < > 0.82 0.95 0.86  CALCIUM 7.6*   < > 8.0*   < > 8.1* 8.3* 8.4*  PROT 5.3*  --  5.6*  --  5.5*  --   --   ALBUMIN 2.1*  --  2.3*  --  2.2*  --   --   AST 20  --  26  --  23  --   --   ALT 14  --  18  --  16  --   --   ALKPHOS 70  --  73  --  78  --   --  BILITOT 0.5  --  0.3  --  0.4  --   --   GFRNONAA 59*   < > >60   < > >60 >60 >60  ANIONGAP 8   < > 9   < > 9 9 10    < > = values in this interval not displayed.     Hematology Recent Labs  Lab 06/11/20 0507 06/14/20 0445 06/15/20 0553 06/16/20 0410  WBC  --  5.5 5.2 5.2  RBC   < > 3.14* 3.10*  3.10* 3.38*  HGB  --  9.1* 9.0* 9.8*  HCT  --  30.7* 29.0* 31.6*  MCV  --  97.8 93.5 93.5  MCH  --  29.0 29.0 29.0  MCHC  --  29.6* 31.0 31.0  RDW  --  15.1 15.1 15.1  PLT  --  226 231 236   < > = values in this interval not displayed.    Cardiac EnzymesNo results for input(s): TROPONINI in the last 168 hours. No results for input(s): TROPIPOC in the last 168 hours.   BNPNo results for input(s): BNP, PROBNP in the last 168 hours.   DDimer  Recent Labs  Lab 06/11/20 1908  DDIMER 4.00*     Radiology    No results found.  Telemetry    NSR with frequent PACs and PVCs- Personally Reviewed  ECG    No new tracing as of 06/10/20 - Personally Reviewed  Cardiac Studies   Echo 06/12/20  1. Left ventricular ejection fraction, by estimation, is 30 to 35%. The  left ventricle has moderately decreased function. The left ventricle  demonstrates global hypokinesis. Left ventricular diastolic parameters are  consistent  with Grade I diastolic  dysfunction (impaired relaxation). Elevated left atrial pressure. The  average left ventricular global longitudinal strain is -14.0 %. The global  longitudinal strain is abnormal.  2. Right ventricular systolic function is normal. The right ventricular  size is normal. There is normal pulmonary artery systolic pressure. The  estimated right ventricular systolic pressure is 53.6 mmHg.  3. The mitral valve is normal in structure. Mild mitral valve  regurgitation. No evidence of mitral stenosis. Moderate mitral annular  calcification.  4. The aortic valve is calcified. There is mild calcification of the  aortic valve. There is mild thickening of the aortic valve. Aortic valve  regurgitation is mild. Mild to moderate aortic valve  sclerosis/calcification is present, without any evidence  of aortic stenosis. Aortic regurgitation PHT measures 374 msec. Aortic  valve mean gradient measures 6.0 mmHg. Aortic valve Vmax measures 1.75  m/s.  5. Aortic dilatation noted. There is mild dilatation of the ascending  aorta, measuring 39 mm.  6. The inferior vena cava is dilated in size with <50% respiratory  variability, suggesting right atrial pressure of 15 mmHg.   Echo 03/25/20 1. Left ventricular ejection fraction, by estimation, is 55 to 60%. The  left ventricle has normal function. The left ventricle has no regional  wall motion abnormalities. Left ventricular diastolic function could not  be evaluated.  2. Right ventricular systolic function is normal. The right ventricular  size is mildly enlarged.  3. The mitral valve is degernerative. There is moderate thickening of the  mitral valve leaflet(s). Normal mobility of the mitral valve leaflets.  Moderate mitral annular calcification.  4. The aortic valve is normal in structure. Aortic valve regurgitation is  mild. No aortic stenosis is present.  5. The inferior vena cava is normal in size with greater  than 50%   respiratory variability, suggesting right atrial pressure of 3 mmHg.  Patient Profile     84 y.o. female with a hx of hypertension, hypothyroidism,LBBB,anemia, osteoporosis, recurrent [name] is being seen today for the evaluation of decreased EFat the request of Dr. Cruzita Lederer.  Assessment & Plan    1. New Cardiomyopathy/Acute on chronic combined heart failure: -Unknown etiology of cardiomyopathy>>admitted for sepsis -Echo this admission showed LVEF 30-35%, G1DD, abnormal global longitudinal strain, mild MR, mild AI, mild aortic dilation 52mm, dilated IVF -No prior ischemic history -GDMT initially deferred due to hypotension however losartan has been restarted -Transitioned to PO lasix today>>>40mg  QD -Weight, up 1lb to 116lbs today>>down 11lbs from admit (127lb on admission) -I&O, put out 700cc yesterday and net + 1.6L -SCr stable at 0.86 and K+ 3.8 -does not appear volume overloaded on exam today -Losartan 25mg  and lopressor started yesterday -if BP stable can transition to Entresto 24-51mg  BID at discharge -continue lasix 40mg  daily -add low dose spiro as outpt -Would keep off home amlodipine and hydralazine while uptitrating HF meds.  -Will need outpt ischemic workup with Lexiscan myoview.  -BMET in 1 week -followup TOC visit in our office with Dr. Meda Coffee in 7-10 days  2. HTN: -PTA amlodipine 5mg  and hydralazine 25mg  TID>>held for soft pressures -continue Losartan 25mg  daily and Lopressor  3. Sepsis/Recurrent UTIs/possible PNA: -CXR with possible PNA -CT abdomen unremarkable -Cultures remain negative  -Abx per IM   4. Respiratory failure -Felt to be secondary to mod-large pleural effusions  -Continue Lasix PO  5.  Nonsustained atrial tachycardia -started Lopressor 12.5mg  BID yesterday -no further Atach but frequent PACs and PVCs -increase Lopressor dose and change to long acting Toprol XL 50mg  daily   Signed, Fransico Him, MD [facility] Pager:  [phone] 06/17/2020, 9:29 AM     For questions or updates, please contact   Please consult [email or web address] for contact info under Cardiology/STEMI.

## 2020-06-17 NOTE — TOC Progression Note (Addendum)
Transition of Care [facility]) - Progression Note    Patient Details  Name: TYNISA VOHS MRN: [id number] Date of Birth: 03-10-1936  [facility] CM/SW Contact  Ross Ludwig, [place] Phone Number: 06/17/2020, 1:51 PM  Clinical Narrative:     CSW spoke to patient's daughter, and provided bed offers.  Patient's daughter asked about going to [facility], CSW contacted them twice and left messages awaiting for call back from facility.  CSW is also still waiting for insurance authorization.  CSW to discuss other facilities with patient's daughter.       Expected Discharge Plan and Services           Expected Discharge Date: 06/17/20                                     Social Determinants of Health (SDOH) Interventions    Readmission Risk Interventions No flowsheet data found.

## 2020-06-17 NOTE — Discharge Summary (Signed)
Physician Discharge Summary  GILLIE FLEITES BPZ:[id number] DOB: 18-Nov-1935 DOA: 06/10/2020  PCP: Luetta Nutting, DO  Admit date: 06/10/2020 Discharge date: 06/17/2020  Admitted From: home Disposition:  SNF  Recommendations for Outpatient Follow-up:  1. Follow up with cardiology in 1 week 2. Potentially start spironolactone in an outpatient setting  Home Health: none Equipment/Devices: none  Discharge Condition: stable CODE STATUS: DNR Diet recommendation: low sodium  HPI: Per admitting MD, Christy Wade is a 84 y.o. female with a past medical history of generalized anxiety disorder, essential hypertension, hypothyroidism, anemia, osteoporosis who was brought into the hospital due to hallucinations that started over the last 24 hours.  Patient noted to be confused and anxious.  Not a very good historian.  Lot of information was obtained from her daughter.  Apparently ever since August patient has been having UTIs.  She has had falls as a result of these infections.  She has been on multiple courses of antibiotics including Keflex, nitrofurantoin, ciprofloxacin and most recently Levaquin.  Based on information in our system it looks like she has had positive urine cultures Klebsiella back in August, E. coli in October, and most recently Enterobacter on November 15.  There is also positive urine culture for Staph hominis from November 19 although it was an insignificant growth. Patient has had falls as a result of her UTIs.  She did have an abnormal right shoulder x-ray.  She was also found to have multiple rib fractures.  She was prescribed Levaquin during her last ER visit which she has taken for about 3 days now.  Hallucinations started about 1 to 2 days ago.  No falls reported in the last 24 hours.  Once again history is limited from the patient. In the emergency department patient was noted to be hypothermic with borderline low blood pressures.  She was found to have elevated lactic  acid level.  She was thought to have sepsis.  Sepsis protocol was initiated.  Patient will need hospitalization for further management.   Hospital Course / Discharge diagnoses: Principal Problem Acute metabolic encephalopathy with hallucinations-possibly due to fluoroquinolone.  No focal deficits. CT head done a few days ago did not show any acute findings, however given recurrent falls and decline I have obtained an MRI which was also negative for acute findings. Cultures have remained negative.  Completed antibiotics.  With discontinuation of Levaquin her hallucinations have resolved completely and mental status has returned to baseline  Active Problems Sepsis due to unspecified organism/hypothermia -Possibly due to UTI, chest x-ray also raise possibility for pneumonia.  Cultures have remained negative.  She was treated with 3 days of Levaquin and 4 days of cefepime for total of 7 days.  CT scan of the chest did not confirm pneumonia but did show pleural effusions Recurrent UTIs -Reason not entirely clear, CT scan of the abdomen without any significant findings to explain recurrent UTIs.  Monitor clinically for now Acute systolic CHF -2D echo done 11/25 shows an EF of 30-35% which is significantly lower than the one done 2 months ago.  Cardiology consulted, appreciate input.  Suspect this is contributing to her recurrent weakness at home and falls.    Her medication regimen has been changed, her home amlodipine as well as hydralazine were discontinued and she was started on Toprol-XL along with Entresto in addition to her home Lasix.  Patient will be seen by cardiology as an outpatient in a week, she likely needs Myoview as well as addition of  spironolactone in the future. Acute hypoxic respiratory failure -Likely in the setting of pleural effusions in the setting of new onset systolic CHF.  Weaned off to room air, appears comfortable.  Please follow low-sodium diet Abnormal imaging of the right  shoulder-x-ray shows large area of cortical lucency in the medial aspect of the right humeral head, unclear if new or chronic.  Apparently Dr. Griffin Basil was aware of this back in August, based on PCP notes it appears that patient is not a surgical candidate. Outpatient follow-up Rib fractures on the right-likely due to previous falls, no significant pain. Possibly causing atelectasis on chest x-ray Essential hypertension-continue Toprol-XL, Lasix, Entresto Hypothyroidism-continue Synthroid.  TSH is normal at 3.15 Normocytic anemia-stable, no bleeding Hyponatremia and hypokalemia -hyponatremia has a chronic component, back in August it was 111.  Thought to be due to volume depletion back then perhaps a component of SIADH.  Sodium has remained stable.  Sodium stable, continue potassium repletion as an outpatient  Discharge Instructions   Allergies as of 06/17/2020      Reactions   Codeine Nausea Only   REACTION: nausea   Sulfamethoxazole Rash   REACTION: rash      Medication List    STOP taking these medications   amLODipine 5 MG tablet Commonly known as: Norvasc   hydrALAZINE 25 MG tablet Commonly known as: APRESOLINE   levofloxacin 500 MG tablet Commonly known as: LEVAQUIN   saccharomyces boulardii 250 MG capsule Commonly known as: Florastor     TAKE these medications   ALPRAZolam 0.25 MG tablet Commonly known as: XANAX Take 1 tablet (0.25 mg total) by mouth at bedtime as needed for anxiety.   Biofreeze 4 % Gel Generic drug: Menthol (Topical Analgesic) Apply 3 oz topically 3 (three) times daily as needed.   CRANBERRY-CALCIUM PO Take 1 tablet by mouth daily.   cycloSPORINE 0.05 % ophthalmic emulsion Commonly known as: RESTASIS Place 1 drop into both eyes 2 (two) times daily.   diphenoxylate-atropine 2.5-0.025 MG tablet Commonly known as: Lomotil Take one tablet 1-2x per day as needed for diarrhea. What changed:   how much to take  how to take this  when to take  this  reasons to take this  additional instructions   Entresto 24-26 MG Generic drug: sacubitril-valsartan Take 1 tablet by mouth 2 (two) times daily.   furosemide 40 MG tablet Commonly known as: LASIX Take 1 tablet (40 mg total) by mouth daily.   levothyroxine 88 MCG tablet Commonly known as: SYNTHROID Take 1 tablet (88 mcg total) by mouth daily before breakfast.   metoprolol succinate 50 MG 24 hr tablet Commonly known as: TOPROL-XL Take 1 tablet (50 mg total) by mouth daily. Take with or immediately following a meal. Start taking on: June 18, 2020   mirtazapine 15 MG tablet Commonly known as: Remeron Take 0.5 tablets (7.5 mg total) by mouth at bedtime. What changed: how much to take   pantoprazole 40 MG tablet Commonly known as: PROTONIX Take 1 tablet (40 mg total) by mouth daily.   potassium chloride 20 MEQ/15ML (10%) Soln Take 30 mLs (40 mEq total) by mouth daily.       Follow-up Information    Grady, Crista Luria, [place] Follow up on 06/30/2020.   Specialty: Cardiology Why: Please arrive 15 minutes early for your 3:15PM post hospital cardiology appointment Contact information: [place] [facility] [place] [address] [phone]               Consultations:  Cardiology   Procedures/Studies:  DG Chest 1 View  Result Date: 06/11/2020 CLINICAL DATA:  New shortness of breath today. EXAM: CHEST  1 VIEW COMPARISON:  One-view chest x-ray 06/10/2020 FINDINGS: Heart is enlarged. Right pleural effusion and basilar airspace disease is again seen. Aeration of the left lung is improved. Right-sided rib fractures are again noted. Fracture of the coracoid process demonstrates interval healing. IMPRESSION: 1. Stable right pleural effusion and basilar airspace disease. 2. Improved aeration of the left lung. 3. Healing right coracoid process fracture. Electronically Signed   By: San Morelle M.D.   On: 06/11/2020 06:46   DG Chest 2 View  Result Date:  06/06/2020 CLINICAL DATA:  Fall this morning. EXAM: CHEST - 2 VIEW COMPARISON:  05/05/2020 FINDINGS: Lungs are adequately inflated demonstrate continued bibasilar opacification compatible with small effusions with associated atelectasis as these findings may be slightly worse. Linear atelectasis over the anterior mid lungs on the lateral film. Cardiac silhouette is unremarkable. Evidence patient's known multiple acute to subacute right posterolateral rib fractures. Superior subluxation of the distal right clavicle at the [facility] joint unchanged. Findings suggest fracture involving the right shoulder which is not well evaluated on this exam. IMPRESSION: 1. Bibasilar opacification compatible with small effusions with associated atelectasis slightly worse. 2. Multiple acute to subacute right posterolateral rib fractures unchanged. 3. Suggestion of fracture involving the right shoulder not well evaluated on this exam. Electronically Signed   By: Marin Olp M.D.   On: 06/06/2020 15:30   DG Shoulder Right  Result Date: 06/06/2020 CLINICAL DATA:  Shoulder pain, fall EXAM: RIGHT SHOULDER - 2+ VIEW COMPARISON:  None. FINDINGS: There is a linear lucency seen at the medial aspect of the humeral head which could represent a nondisplaced fracture. Again noted are posterior right rib fractures as on the prior exam. A slightly incongruous AC joint is again noted. Overlying soft tissue swelling. IMPRESSION: Lucency at the medial aspect of the humeral head which may be a incomplete fracture versus overlap of shadows. Electronically Signed   By: Prudencio Pair M.D.   On: 06/06/2020 15:34   CT Head Wo Contrast  Result Date: 06/06/2020 CLINICAL DATA:  Head trauma. EXAM: CT HEAD WITHOUT CONTRAST TECHNIQUE: Contiguous axial images were obtained from the base of the skull through the vertex without intravenous contrast. COMPARISON:  None. FINDINGS: Brain: No evidence of acute infarction, hemorrhage, hydrocephalus, extra-axial  collection or mass lesion/mass effect. Patchy white matter hypoattenuation, likely the sequela of chronic microvascular ischemic disease. Vascular: Calcific atherosclerosis. Skull: Normal. Negative for fracture or focal lesion. Sinuses/Orbits: No acute finding.  Visualized sinuses are clear. Other: No mastoid effusions. IMPRESSION: No evidence of acute intracranial abnormality. Electronically Signed   By: Margaretha Sheffield MD   On: 06/06/2020 16:23   CT ANGIO CHEST PE W OR WO CONTRAST  Result Date: 06/12/2020 CLINICAL DATA:  Shortness of breath, elevated D-dimer EXAM: CT ANGIOGRAPHY CHEST WITH CONTRAST TECHNIQUE: Multidetector CT imaging of the chest was performed using the standard protocol during bolus administration of intravenous contrast. Multiplanar CT image reconstructions and MIPs were obtained to evaluate the vascular anatomy. CONTRAST:  124mL OMNIPAQUE IOHEXOL 350 MG/ML SOLN COMPARISON:  03/25/2020 FINDINGS: Cardiovascular: Satisfactory opacification of the pulmonary arteries to the segmental level. No evidence of pulmonary embolism. Thoracic aorta is nonaneurysmal. There is tortuosity of the descending thoracic aorta. Three vessel arch with widely patent arch vessels. Atherosclerotic calcification of the aorta. Normal heart size. No pericardial effusion. Mediastinum/Nodes: No axillary, mediastinal, or hilar lymphadenopathy. Esophagus  within normal limits. Small hiatal hernia. Lungs/Pleura: Moderate to large right-sided pleural effusion with complete atelectasis/collapse of the right lower lobe. Moderate left-sided pleural effusion with partial left lower lobe atelectasis. No pneumothorax. Upper Abdomen: No acute findings identified. Musculoskeletal: Healing fracture of the base of the right coracoid process. Additional nondisplaced healing fracture at the tip of the coracoid process. Large fracture deformity of the medial aspect of the right humeral head. Large glenohumeral joint effusion with  intra-articular fracture fragments interval development of numerous prominent calcifications within the right glenohumeral joint capsule and biceps tendon sheath. Os acromiale. Subacute fractures of the right sixth through eleventh ribs. The posterior eighth and ninth rib fractures appear to extend into the right pleural space posteriorly (series 6, image 70). Exaggerated thoracic kyphosis. Review of the MIP images confirms the above findings. IMPRESSION: 1. No evidence of acute pulmonary embolism. 2. Moderate to large right-sided pleural effusion with complete atelectasis/collapse of the right lower lobe. 3. Moderate left-sided pleural effusion with partial left lower lobe atelectasis. 4. Subacute fractures of the right sixth through eleventh ribs. The posterior eighth and ninth rib fractures appear to extend into the right pleural space posteriorly. No pneumothorax. 5. Large fracture deformity of the medial aspect of the right humeral head with numerous fracture fragments and dystrophic calcifications within the right glenohumeral joint capsule and biceps tendon sheath. Aortic Atherosclerosis (ICD10-I70.0). Electronically Signed   By: Davina Poke D.O.   On: 06/12/2020 13:09   MR BRAIN WO CONTRAST  Result Date: 06/12/2020 CLINICAL DATA:  Mental status changes.  Unknown cause. EXAM: MRI HEAD WITHOUT CONTRAST TECHNIQUE: Multiplanar, multiecho pulse sequences of the brain and surrounding structures were obtained without intravenous contrast. COMPARISON:  CT head without contrast 06/06/2020 FINDINGS: Brain: No acute infarct, hemorrhage, or mass lesion is present. Moderate generalized atrophy and diffuse white matter disease is present bilaterally. The ventricles are proportionate to the degree of atrophy. No significant white matter lesions are present. The brainstem and cerebellum are within normal limits. Vascular: Flow is present in the major intracranial arteries. Skull and upper cervical spine: The  craniocervical junction is normal. Upper cervical spine is within normal limits. Marrow signal is unremarkable. Sinuses/Orbits: The paranasal sinuses and mastoid air cells are clear. The globes and orbits are within normal limits. IMPRESSION: 1. No acute intracranial abnormality. 2. Moderate generalized atrophy and diffuse white matter disease likely reflects the sequela of chronic microvascular ischemia. Electronically Signed   By: San Morelle M.D.   On: 06/12/2020 17:45   DG Chest Port 1 View  Result Date: 06/10/2020 CLINICAL DATA:  Questionable sepsis, UTI on antibiotics, having hallucinations, unsteady on feet, increased falls, hypertension EXAM: PORTABLE CHEST 1 VIEW COMPARISON:  Portable exam 1307 hours compared to 06/06/2020 FINDINGS: Enlargement of cardiac silhouette. Rotated to the RIGHT with kyphotic positioning. Mediastinal contours and pulmonary vascularity normal. Atelectasis and questionable infiltrate at RIGHT base with associated RIGHT pleural effusion. Minimal subsegmental atelectasis LEFT base. Remaining lungs clear. No pneumothorax. Bones demineralized. IMPRESSION: Atelectasis and small pleural effusion at RIGHT base with questionable associated RIGHT basilar infiltrate. Minimal subsegmental atelectasis LEFT base. Electronically Signed   By: Lavonia Dana M.D.   On: 06/10/2020 13:14   DG Humerus Right  Result Date: 06/06/2020 CLINICAL DATA:  Status post fall. EXAM: RIGHT HUMERUS - 2+ VIEW COMPARISON:  Right shoulder plain films, dated March 05, 2020, and chest CTA, dated March 25, 2020. FINDINGS: A large area of irregular appearing cortical lucency is seen overlying the medial aspect of  the right humeral head. This represents a new finding when compared to the prior right shoulder plain films, however, a displaced fracture deformity of the right scapula is noted near this region on the prior chest CTA. Six, seventh and eighth right rib fractures are again seen. There is no  evidence of dislocation. Ill-defined, lobulated soft tissue calcification is also seen overlying this region, as well as the adjacent portion of the right scapula. IMPRESSION: 1. Large cortical lucency overlying the right humeral head which may represent sequelae associated with overlying chronic fracture fragments originating from the right scapula. MRI correlation is recommended, as cortical destruction of the right humeral head cannot be excluded. 2. Multiple right-sided rib fractures. Electronically Signed   By: Virgina Norfolk M.D.   On: 06/06/2020 21:34   ECHOCARDIOGRAM COMPLETE  Result Date: 06/12/2020    ECHOCARDIOGRAM REPORT   Patient Name:   ANZAL BARTNICK Date of Exam: 06/12/2020 Medical Rec #:  [id number]       Height:       67.0 in Accession #:    [id number]      Weight:       127.4 lb Date of Birth:  1936-02-22      BSA:          1.670 m Patient Age:    40 years        BP:           108/55 mmHg Patient Gender: F               HR:           68 bpm. Exam Location:  Inpatient Procedure: 2D Echo, 3D Echo, Color Doppler, Cardiac Doppler and Intracardiac            Opacification Agent Indications:    Abnormal ECG 794.31 / R94.31  History:        Patient has prior history of Echocardiogram examinations, most                 recent 03/25/2020. Risk Factors:Hypertension. Thyroid disease.                 Anemia.  Sonographer:    Merrie Roof RDCS Referring Phys: [place]  1. Left ventricular ejection fraction, by estimation, is 30 to 35%. The left ventricle has moderately decreased function. The left ventricle demonstrates global hypokinesis. Left ventricular diastolic parameters are consistent with Grade I diastolic dysfunction (impaired relaxation). Elevated left atrial pressure. The average left ventricular global longitudinal strain is -14.0 %. The global longitudinal strain is abnormal.  2. Right ventricular systolic function is normal. The right ventricular size is normal.  There is normal pulmonary artery systolic pressure. The estimated right ventricular systolic pressure is 66.4 mmHg.  3. The mitral valve is normal in structure. Mild mitral valve regurgitation. No evidence of mitral stenosis. Moderate mitral annular calcification.  4. The aortic valve is calcified. There is mild calcification of the aortic valve. There is mild thickening of the aortic valve. Aortic valve regurgitation is mild. Mild to moderate aortic valve sclerosis/calcification is present, without any evidence of aortic stenosis. Aortic regurgitation PHT measures 374 msec. Aortic valve mean gradient measures 6.0 mmHg. Aortic valve Vmax measures 1.75 m/s.  5. Aortic dilatation noted. There is mild dilatation of the ascending aorta, measuring 39 mm.  6. The inferior vena cava is dilated in size with <50% respiratory variability, suggesting right atrial pressure of 15 mmHg. Comparison(s): Prior images reviewed  side by side. The left ventricular function is worsened. FINDINGS  Left Ventricle: Left ventricular ejection fraction, by estimation, is 30 to 35%. The left ventricle has moderately decreased function. The left ventricle demonstrates global hypokinesis. Definity contrast agent was given IV to delineate the left ventricular endocardial borders. The average left ventricular global longitudinal strain is -14.0 %. The global longitudinal strain is abnormal. The left ventricular internal cavity size was normal in size. There is no left ventricular hypertrophy. Left ventricular diastolic parameters are consistent with Grade I diastolic dysfunction (impaired relaxation). Elevated left atrial pressure. Right Ventricle: The right ventricular size is normal. No increase in right ventricular wall thickness. Right ventricular systolic function is normal. There is normal pulmonary artery systolic pressure. The tricuspid regurgitant velocity is 2.29 m/s, and  with an assumed right atrial pressure of 15 mmHg, the estimated  right ventricular systolic pressure is 37.1 mmHg. Left Atrium: Left atrial size was normal in size. Right Atrium: Right atrial size was normal in size. Pericardium: There is no evidence of pericardial effusion. Mitral Valve: The mitral valve is normal in structure. Moderate mitral annular calcification. Mild mitral valve regurgitation. No evidence of mitral valve stenosis. Tricuspid Valve: The tricuspid valve is normal in structure. Tricuspid valve regurgitation is mild . No evidence of tricuspid stenosis. Aortic Valve: The aortic valve is calcified. There is mild calcification of the aortic valve. There is mild thickening of the aortic valve. Aortic valve regurgitation is mild. Aortic regurgitation PHT measures 374 msec. Mild to moderate aortic valve sclerosis/calcification is present, without any evidence of aortic stenosis. Aortic valve mean gradient measures 6.0 mmHg. Aortic valve peak gradient measures 12.2 mmHg. Aortic valve area, by VTI measures 1.35 cm. Pulmonic Valve: The pulmonic valve was normal in structure. Pulmonic valve regurgitation is not visualized. No evidence of pulmonic stenosis. Aorta: Aortic dilatation noted. There is mild dilatation of the ascending aorta, measuring 39 mm. Venous: The inferior vena cava is dilated in size with less than 50% respiratory variability, suggesting right atrial pressure of 15 mmHg. IAS/Shunts: No atrial level shunt detected by color flow Doppler.  LEFT VENTRICLE PLAX 2D LVIDd:         4.60 cm     Diastology LV PW:         0.80 cm     LV e' medial:    5.00 cm/s LV IVS:        1.00 cm     LV E/e' medial:  14.7 LVOT diam:     1.70 cm     LV e' lateral:   4.68 cm/s LV SV:         44          LV E/e' lateral: 15.7 LV SV Index:   26 LVOT Area:     2.27 cm    2D Longitudinal Strain                            2D Strain GLS Avg:     -14.0 %  LV Volumes (MOD) LV vol s, MOD A2C: 97.9 ml                            3D Volume EF:                            3D EF:  40 %                             LV EDV:       124 ml                            LV ESV:       74 ml                            LV SV:        50 ml RIGHT VENTRICLE RV S prime:     18.10 cm/s TAPSE (M-mode): 2.0 cm LEFT ATRIUM             Index       RIGHT ATRIUM           Index LA Vol (A2C):   65.2 ml 39.02 ml/m RA Area:     17.10 cm LA Vol (A4C):   53.1 ml 31.80 ml/m RA Volume:   44.50 ml  26.65 ml/m LA Biplane Vol: 53.1 ml 31.80 ml/m  AORTIC VALVE AV Area (Vmax):    1.23 cm AV Area (Vmean):   1.35 cm AV Area (VTI):     1.35 cm AV Vmax:           175.00 cm/s AV Vmean:          117.000 cm/s AV VTI:            0.324 m AV Peak Grad:      12.2 mmHg AV Mean Grad:      6.0 mmHg LVOT Vmax:         95.10 cm/s LVOT Vmean:        69.700 cm/s LVOT VTI:          0.193 m LVOT/AV VTI ratio: 0.60 AI PHT:            374 msec  AORTA Ao Root diam: 3.20 cm Ao Asc diam:  3.90 cm MITRAL VALVE                TRICUSPID VALVE MV Area (PHT): 4.86 cm     TR Peak grad:   21.0 mmHg MV Decel Time: 156 msec     TR Vmax:        229.00 cm/s MV E velocity: 73.30 cm/s MV A velocity: 107.00 cm/s  SHUNTS MV E/A ratio:  0.69         Systemic VTI:  0.19 m                             Systemic Diam: 1.70 cm Candee Furbish MD Electronically signed by Candee Furbish MD Signature Date/Time: 06/12/2020/2:01:10 PM    Final    CT RENAL STONE STUDY  Result Date: 06/10/2020 CLINICAL DATA:  Frequent UTI EXAM: CT ABDOMEN AND PELVIS WITHOUT CONTRAST TECHNIQUE: Multidetector CT imaging of the abdomen and pelvis was performed following the standard protocol without IV contrast. COMPARISON:  2016 FINDINGS: Lower chest: Cardiomegaly. Partially imaged right greater than left pleural effusions with adjacent atelectasis/consolidation. Hepatobiliary: No focal liver abnormality is seen. No gallstones, gallbladder wall thickening, or biliary dilatation. Pancreas: Unremarkable. Spleen: Unremarkable. Adrenals/Urinary Tract: Small cyst of the upper pole of the right kidney. No  renal calculi hydronephrosis. There are no ureteral calculi identified. The bladder  is poorly distended. Stomach/Bowel: Stomach is within normal limits apart from possible small hiatal hernia. Bowel is normal in caliber. There is an anastomosis in the right lower quadrant. Vascular/Lymphatic: Aortic atherosclerosis. No enlarged lymph nodes identified. Reproductive: Status post hysterectomy. No adnexal masses. Other: No ascites.  Mild body wall edema. Musculoskeletal: Degenerative changes of the included spine particularly at L4-L5 and L5-S1. No acute osseous abnormality. IMPRESSION: No urinary tract calculi. Pyelonephritis is not well evaluated on this noncontrast study. Partially imaged right greater than left pleural effusions with adjacent atelectasis/consolidation. Cardiomegaly. Electronically Signed   By: Macy Mis M.D.   On: 06/10/2020 19:48      Subjective: - no chest pain, shortness of breath, no abdominal pain, nausea or vomiting.   Discharge Exam: BP 133/73   Pulse 84   Temp 98.2 F (36.8 C) (Oral)   Resp 20   Ht 5\' 7"  (1.702 m)   Wt 53 kg   SpO2 95%   BMI 18.30 kg/m   General: Pt is alert, awake, not in acute distress Cardiovascular: RRR, S1/S2 +, no rubs, no gallops Respiratory: CTA bilaterally, no wheezing, no rhonchi Abdominal: Soft, NT, ND, bowel sounds + Extremities: no edema, no cyanosis  The results of significant diagnostics from this hospitalization (including imaging, microbiology, ancillary and laboratory) are listed below for reference.     Microbiology: Recent Results (from the past 240 hour(s))  Culture, blood (routine x 2)     Status: None   Collection Time: 06/10/20 12:34 PM   Specimen: BLOOD  Result Value Ref Range Status   Specimen Description   Final    BLOOD LEFT WRIST Performed at [facility] [address], [place], [place] [address]    Special Requests   Final    BOTTLES DRAWN AEROBIC AND ANAEROBIC Blood Culture  adequate volume Performed at [facility] [address] [place], [place] [address]    Culture   Final    NO GROWTH 5 DAYS Performed at [facility], [place] [address], [place], [place] [address]    Report Status 06/15/2020 FINAL  Final  Culture, blood (routine x 2)     Status: None   Collection Time: 06/10/20 12:34 PM   Specimen: BLOOD  Result Value Ref Range Status   Specimen Description   Final    BLOOD RIGHT ANTECUBITAL Performed at [facility] [address] [place], [place] [address]    Special Requests   Final    BOTTLES DRAWN AEROBIC AND ANAEROBIC Blood Culture results may not be optimal due to an excessive volume of blood received in culture bottles Performed at [facility] [address], [place], [place] [address]    Culture   Final    NO GROWTH 5 DAYS Performed at [facility], [place] [address], [place], [place] [address]    Report Status 06/15/2020 FINAL  Final  Resp Panel by RT-PCR (Flu A&B, Covid) Nasopharyngeal Swab     Status: None   Collection Time: 06/10/20  4:57 PM   Specimen: Nasopharyngeal Swab; Nasopharyngeal(NP) swabs in vial transport medium  Result Value Ref Range Status   SARS Coronavirus 2 by RT PCR NEGATIVE NEGATIVE Final    Comment: (NOTE) SARS-CoV-2 target nucleic acids are NOT DETECTED.  The SARS-CoV-2 RNA is generally detectable in upper respiratory specimens during the acute phase of infection. The lowest concentration of SARS-CoV-2 viral copies this assay can detect is 138 copies/mL. A negative  result does not preclude SARS-Cov-2 infection and should not be used as the sole basis for treatment or other patient management decisions. A negative result may occur with  improper specimen collection/handling, submission of specimen other than nasopharyngeal swab, presence of viral mutation(s) within the areas targeted by this assay, and inadequate number of viral copies(<138  copies/mL). A negative result must be combined with clinical observations, patient history, and epidemiological information. The expected result is Negative.  Fact Sheet for Patients:  EntrepreneurPulse.com.au  Fact Sheet for Healthcare Providers:  IncredibleEmployment.be  This test is no t yet approved or cleared by the [place] FDA and  has been authorized for detection and/or diagnosis of SARS-CoV-2 by FDA under an Emergency Use Authorization (EUA). This EUA will remain  in effect (meaning this test can be used) for the duration of the COVID-19 declaration under Section 564(b)(1) of the Act, 21 U.S.C.section 360bbb-3(b)(1), unless the authorization is terminated  or revoked sooner.       Influenza A by PCR NEGATIVE NEGATIVE Final   Influenza B by PCR NEGATIVE NEGATIVE Final    Comment: (NOTE) The Xpert Xpress SARS-CoV-2/FLU/RSV plus assay is intended as an aid in the diagnosis of influenza from Nasopharyngeal swab specimens and should not be used as a sole basis for treatment. Nasal washings and aspirates are unacceptable for Xpert Xpress SARS-CoV-2/FLU/RSV testing.  Fact Sheet for Patients: EntrepreneurPulse.com.au  Fact Sheet for Healthcare Providers: IncredibleEmployment.be  This test is not yet approved or cleared by the [place] FDA and has been authorized for detection and/or diagnosis of SARS-CoV-2 by FDA under an Emergency Use Authorization (EUA). This EUA will remain in effect (meaning this test can be used) for the duration of the COVID-19 declaration under Section 564(b)(1) of the Act, 21 U.S.C. section 360bbb-3(b)(1), unless the authorization is terminated or revoked.  Performed at [facility], [place] [address] [place], [place] [address]   Urine culture     Status: None   Collection Time: 06/10/20  5:15 PM   Specimen: Urine, Clean Catch  Result Value Ref  Range Status   Specimen Description   Final    URINE, CLEAN CATCH Performed at [facility], [place] [address] [place], [place] [address]    Special Requests   Final    NONE Performed at [facility], [place] [address], [place], [place] [address]    Culture   Final    NO GROWTH Performed at [facility], [place] [address], [place], [place] [address]    Report Status 06/12/2020 FINAL  Final  Resp Panel by RT-PCR (Flu A&B, Covid) Nasopharyngeal Swab     Status: None   Collection Time: 06/17/20  6:10 AM   Specimen: Nasopharyngeal Swab; Nasopharyngeal(NP) swabs in vial transport medium  Result Value Ref Range Status   SARS Coronavirus 2 by RT PCR NEGATIVE NEGATIVE Final    Comment: (NOTE) SARS-CoV-2 target nucleic acids are NOT DETECTED.  The SARS-CoV-2 RNA is generally detectable in upper respiratory specimens during the acute phase of infection. The lowest concentration of SARS-CoV-2 viral copies this assay can detect is 138 copies/mL. A negative result does not preclude SARS-Cov-2 infection and should not be used as the sole basis for treatment or other patient management decisions. A negative result may occur with  improper specimen collection/handling, submission of specimen other than nasopharyngeal swab, presence of viral mutation(s) within the areas targeted by this assay, and inadequate number of viral copies(<138 copies/mL). A negative  result must be combined with clinical observations, patient history, and epidemiological information. The expected result is Negative.  Fact Sheet for Patients:  EntrepreneurPulse.com.au  Fact Sheet for Healthcare Providers:  IncredibleEmployment.be  This test is no t yet approved or cleared by the [place] FDA and  has been authorized for detection and/or diagnosis of SARS-CoV-2 by FDA under an Emergency Use Authorization (EUA). This EUA will remain  in effect  (meaning this test can be used) for the duration of the COVID-19 declaration under Section 564(b)(1) of the Act, 21 U.S.C.section 360bbb-3(b)(1), unless the authorization is terminated  or revoked sooner.       Influenza A by PCR NEGATIVE NEGATIVE Final   Influenza B by PCR NEGATIVE NEGATIVE Final    Comment: (NOTE) The Xpert Xpress SARS-CoV-2/FLU/RSV plus assay is intended as an aid in the diagnosis of influenza from Nasopharyngeal swab specimens and should not be used as a sole basis for treatment. Nasal washings and aspirates are unacceptable for Xpert Xpress SARS-CoV-2/FLU/RSV testing.  Fact Sheet for Patients: EntrepreneurPulse.com.au  Fact Sheet for Healthcare Providers: IncredibleEmployment.be  This test is not yet approved or cleared by the [place] FDA and has been authorized for detection and/or diagnosis of SARS-CoV-2 by FDA under an Emergency Use Authorization (EUA). This EUA will remain in effect (meaning this test can be used) for the duration of the COVID-19 declaration under Section 564(b)(1) of the Act, 21 U.S.C. section 360bbb-3(b)(1), unless the authorization is terminated or revoked.  Performed at [facility], [place] [address], [place], [place] [address]      Labs: Basic Metabolic Panel: Recent Labs  Lab 06/10/20 1234 06/10/20 1234 06/11/20 0507 06/12/20 0409 06/13/20 0449 06/14/20 0445 06/15/20 0553 06/16/20 0410 06/17/20 0503  NA 133*   < > 133*   < > 133* 136 134* 134* 132*  K 3.0*   < > 4.0   < > 3.6 4.2 3.5 3.7 3.8  CL 94*   < > 99   < > 97* 96* 92* 92* 91*  CO2 30   < > 24   < > 28 31 33* 33* 31  GLUCOSE 107*   < > 92   < > 87 93 83 80 77  BUN 14   < > 13   < > 12 14 13 14 13   CREATININE 0.86   < > 0.81   < > 0.97 0.79 0.82 0.95 0.86  CALCIUM 8.5*   < > 8.1*   < > 7.6* 8.0* 8.1* 8.3* 8.4*  MG 1.8  --  1.7  --   --   --   --   --   --    < > = values in this interval not  displayed.   Liver Function Tests: Recent Labs  Lab 06/11/20 0507 06/12/20 0409 06/13/20 0449 06/14/20 0445 06/15/20 0553  AST 20 19 20 26 23   ALT 17 16 14 18 16   ALKPHOS 75 70 70 73 78  BILITOT 0.5 0.5 0.5 0.3 0.4  PROT 5.5* 5.4* 5.3* 5.6* 5.5*  ALBUMIN 2.5* 2.3* 2.1* 2.3* 2.2*   CBC: Recent Labs  Lab 06/10/20 1234 06/11/20 0507 06/12/20 0409 06/13/20 0449 06/14/20 0445 06/15/20 0553 06/16/20 0410  WBC 5.5   < > 6.5 5.7 5.5 5.2 5.2  NEUTROABS 4.1  --   --   --   --   --   --   HGB 9.3*   < > 8.1* 8.3* 9.1* 9.0* 9.8*  HCT 28.9*   < > 25.6* 27.4* 30.7* 29.0* 31.6*  MCV 91.2   < > 93.1 96.1 97.8 93.5 93.5  PLT 325   < > 264 242 226 231 236   < > = values in this interval not displayed.   CBG: No results for input(s): GLUCAP in the last 168 hours. Hgb A1c No results for input(s): HGBA1C in the last 72 hours. Lipid Profile No results for input(s): CHOL, HDL, LDLCALC, TRIG, CHOLHDL, LDLDIRECT in the last 72 hours. Thyroid function studies No results for input(s): TSH, T4TOTAL, T3FREE, THYROIDAB in the last 72 hours.  Invalid input(s): FREET3 Urinalysis    Component Value Date/Time   COLORURINE YELLOW 06/10/2020 [place] 06/10/2020 1715   LABSPEC 1.014 06/10/2020 1715   PHURINE 5.0 06/10/2020 1715   GLUCOSEU NEGATIVE 06/10/2020 1715   HGBUR NEGATIVE 06/10/2020 1715   HGBUR negative 03/02/2010 0000   BILIRUBINUR NEGATIVE 06/10/2020 1715   BILIRUBINUR negative 06/02/2020 1456   BILIRUBINUR NEGATIVE 04/16/2019 1000   KETONESUR NEGATIVE 06/10/2020 1715   PROTEINUR NEGATIVE 06/10/2020 1715   UROBILINOGEN 0.2 06/02/2020 1456   UROBILINOGEN 0.2 03/02/2010 0000   NITRITE NEGATIVE 06/10/2020 1715   LEUKOCYTESUR NEGATIVE 06/10/2020 1715    FURTHER DISCHARGE INSTRUCTIONS:   Get Medicines reviewed and adjusted: Please take all your medications with you for your next visit with your Primary MD   Laboratory/radiological data: Please request your  Primary MD to go over all hospital tests and procedure/radiological results at the follow up, please ask your Primary MD to get all Hospital records sent to his/her office.   In some cases, they will be blood work, cultures and biopsy results pending at the time of your discharge. Please request that your primary care M.D. goes through all the records of your hospital data and follows up on these results.   Also Note the following: If you experience worsening of your admission symptoms, develop shortness of breath, life threatening emergency, suicidal or homicidal thoughts you must seek medical attention immediately by calling 911 or calling your MD immediately  if symptoms less severe.   You must read complete instructions/literature along with all the possible adverse reactions/side effects for all the Medicines you take and that have been prescribed to you. Take any new Medicines after you have completely understood and accpet all the possible adverse reactions/side effects.    Do not drive when taking Pain medications or sleeping medications (Benzodaizepines)   Do not take more than prescribed Pain, Sleep and Anxiety Medications. It is not advisable to combine anxiety,sleep and pain medications without talking with your primary care practitioner   Special Instructions: If you have smoked or chewed Tobacco  in the last 2 yrs please stop smoking, stop any regular Alcohol  and or any Recreational drug use.   Wear Seat belts while driving.   Please note: You were cared for by a hospitalist during your hospital stay. Once you are discharged, your primary care physician will handle any further medical issues. Please note that NO REFILLS for any discharge medications will be authorized once you are discharged, as it is imperative that you return to your primary care physician (or establish a relationship with a primary care physician if you do not have one) for your post hospital discharge needs so  that they can reassess your need for medications and monitor your lab values.  Time coordinating discharge: 35 minutes  SIGNED:  Marzetta Board, MD, PhD 06/17/2020, 11:47 AM

## 2020-06-17 NOTE — TOC Progression Note (Signed)
Transition of Care [facility]) - Progression Note    Patient Details  Name: Christy Wade MRN: [id number] Date of Birth: [birth year]  Transition of Care [facility] CM/SW Contact  Joaquin Courts, RN Phone Number: 06/17/2020, 4:11 PM  Clinical Narrative:    Insurance authorization initiated, Magazine features editor #  [id number].  Requested clinicals faxed.         Expected Discharge Plan and Services           Expected Discharge Date: 06/17/20                                     Social Determinants of Health (SDOH) Interventions    Readmission Risk Interventions No flowsheet data found.

## 2020-06-17 NOTE — Plan of Care (Signed)
  Problem: Education: Goal: Knowledge of General Education information will improve Description: Including pain rating scale, medication(s)/side effects and non-pharmacologic comfort measures Outcome: Progressing   Problem: Clinical Measurements: Goal: Respiratory complications will improve Outcome: Progressing   Problem: Clinical Measurements: Goal: Will remain free from infection Outcome: Completed/Met

## 2020-06-17 NOTE — Consult Note (Signed)
   [facility] CM Inpatient Consult   06/17/2020  MIGNON BECHLER 1935/08/22 [id number]   Patient screened for high risk score for unplanned readmission. Chart reviewed to assess for potential [place] Management community service needs. Per review, current disposition plan is for skilled nursing facility. No [facility] Care Management follow up needs at this time.  Netta Cedars, MSN, [facility] Liaison Nurse Mobile Phone [phone]  Toll free office [phone]

## 2020-06-18 ENCOUNTER — Inpatient Hospital Stay (HOSPITAL_COMMUNITY): Payer: Medicare PPO

## 2020-06-18 DIAGNOSIS — R0602 Shortness of breath: Secondary | ICD-10-CM | POA: Diagnosis not present

## 2020-06-18 DIAGNOSIS — R443 Hallucinations, unspecified: Secondary | ICD-10-CM | POA: Diagnosis not present

## 2020-06-18 DIAGNOSIS — N390 Urinary tract infection, site not specified: Secondary | ICD-10-CM | POA: Diagnosis not present

## 2020-06-18 DIAGNOSIS — I10 Essential (primary) hypertension: Secondary | ICD-10-CM | POA: Diagnosis not present

## 2020-06-18 DIAGNOSIS — I420 Dilated cardiomyopathy: Secondary | ICD-10-CM | POA: Diagnosis not present

## 2020-06-18 DIAGNOSIS — I5041 Acute combined systolic (congestive) and diastolic (congestive) heart failure: Secondary | ICD-10-CM | POA: Diagnosis not present

## 2020-06-18 DIAGNOSIS — I471 Supraventricular tachycardia: Secondary | ICD-10-CM | POA: Diagnosis not present

## 2020-06-18 LAB — BRAIN NATRIURETIC PEPTIDE: B Natriuretic Peptide: 149.5 pg/mL — ABNORMAL HIGH (ref 0.0–100.0)

## 2020-06-18 LAB — PHOSPHORUS: Phosphorus: 3.4 mg/dL (ref 2.5–4.6)

## 2020-06-18 LAB — MAGNESIUM: Magnesium: 2 mg/dL (ref 1.7–2.4)

## 2020-06-18 LAB — D-DIMER, QUANTITATIVE: D-Dimer, Quant: 3.75 ug/mL-FEU — ABNORMAL HIGH (ref 0.00–0.50)

## 2020-06-18 MED ORDER — SACUBITRIL-VALSARTAN 24-26 MG PO TABS
1.0000 | ORAL_TABLET | Freq: Two times a day (BID) | ORAL | Status: DC
  Administered 2020-06-18 (×2): 1 via ORAL
  Filled 2020-06-18 (×4): qty 1

## 2020-06-18 MED ORDER — PROSOURCE PLUS PO LIQD
30.0000 mL | Freq: Every day | ORAL | Status: DC
  Administered 2020-06-18 – 2020-06-19 (×2): 30 mL via ORAL
  Filled 2020-06-18 (×2): qty 30

## 2020-06-18 NOTE — Progress Notes (Addendum)
Progress Note  Patient Name: Christy Wade Date of Encounter: 06/18/2020  Primary Cardiologist: Ena Dawley, MD  Subjective   Waiting SNF placement. Was nauseated earlier and had to be catheterized.  Now feels SOB  Inpatient Medications    Scheduled Meds:  cycloSPORINE  1 drop Both Eyes BID   enoxaparin (LOVENOX) injection  40 mg Subcutaneous Q24H   feeding supplement  237 mL Oral BID BM   furosemide  40 mg Oral Daily   influenza vaccine adjuvanted  0.5 mL Intramuscular Tomorrow-1000   levothyroxine  88 mcg Oral QAC breakfast   losartan  25 mg Oral Daily   metoprolol succinate  50 mg Oral Daily   multivitamin with minerals  1 tablet Oral Daily   pantoprazole  40 mg Oral Daily   potassium chloride  40 mEq Oral Daily   saccharomyces boulardii  250 mg Oral BID   Continuous Infusions:  PRN Meds: acetaminophen **OR** acetaminophen, hydrALAZINE, ipratropium-albuterol, ondansetron **OR** ondansetron (ZOFRAN) IV   Vital Signs    Vitals:   06/17/20 1011 06/17/20 1407 06/17/20 2050 06/18/20 0457  BP: 133/73 113/69 120/66 131/61  Pulse: 84 81 85 (!) 54  Resp:  18 18 18   Temp:  98.8 F (37.1 C) 98.4 F (36.9 C) 97.7 F (36.5 C)  TempSrc:  Oral Oral Oral  SpO2:  97% 97% 97%  Weight:    51.9 kg  Height:        Intake/Output Summary (Last 24 hours) at 06/18/2020 0823 Last data filed at 06/18/2020 0650 Gross per 24 hour  Intake 840 ml  Output 1450 ml  Net -610 ml   Filed Weights   06/16/20 0600 06/17/20 0443 06/18/20 0457  Weight: 52.3 kg 53 kg 51.9 kg    Physical Exam   GEN: Well nourished, well developed in no acute distress HEENT: Normal NECK: No JVD; No carotid bruits LYMPHATICS: No lymphadenopathy CARDIAC:RRR, no murmurs, rubs, gallops RESPIRATORY:  Clear to auscultation without rales, wheezing or rhonchi  ABDOMEN: Soft, non-tender, non-distended MUSCULOSKELETAL:  No edema; No deformity  SKIN: Warm and dry NEUROLOGIC:  Alert and  oriented x 3 PSYCHIATRIC:  Normal affect    Labs    Chemistry Recent Labs  Lab 06/13/20 0449 06/13/20 0449 06/14/20 0445 06/14/20 0445 06/15/20 0553 06/16/20 0410 06/17/20 0503  NA 133*   < > 136   < > 134* 134* 132*  K 3.6   < > 4.2   < > 3.5 3.7 3.8  CL 97*   < > 96*   < > 92* 92* 91*  CO2 28   < > 31   < > 33* 33* 31  GLUCOSE 87   < > 93   < > 83 80 77  BUN 12   < > 14   < > 13 14 13   CREATININE 0.97   < > 0.79   < > 0.82 0.95 0.86  CALCIUM 7.6*   < > 8.0*   < > 8.1* 8.3* 8.4*  PROT 5.3*  --  5.6*  --  5.5*  --   --   ALBUMIN 2.1*  --  2.3*  --  2.2*  --   --   AST 20  --  26  --  23  --   --   ALT 14  --  18  --  16  --   --   ALKPHOS 70  --  73  --  78  --   --  BILITOT 0.5  --  0.3  --  0.4  --   --   GFRNONAA 59*   < > >60   < > >60 >60 >60  ANIONGAP 8   < > 9   < > 9 9 10    < > = values in this interval not displayed.     Hematology Recent Labs  Lab 06/14/20 0445 06/15/20 0553 06/16/20 0410  WBC 5.5 5.2 5.2  RBC 3.14* 3.10*   3.10* 3.38*  HGB 9.1* 9.0* 9.8*  HCT 30.7* 29.0* 31.6*  MCV 97.8 93.5 93.5  MCH 29.0 29.0 29.0  MCHC 29.6* 31.0 31.0  RDW 15.1 15.1 15.1  PLT 226 231 236    Cardiac EnzymesNo results for input(s): TROPONINI in the last 168 hours. No results for input(s): TROPIPOC in the last 168 hours.   BNPNo results for input(s): BNP, PROBNP in the last 168 hours.   DDimer  Recent Labs  Lab 06/11/20 1908  DDIMER 4.00*     Radiology    No results found.  Telemetry    NSR with short run of nonsustained atrial tachycardia and PVCs- Personally Reviewed  ECG    No new tracing as of 06/10/20 - Personally Reviewed  Cardiac Studies   Echo 06/12/20  1. Left ventricular ejection fraction, by estimation, is 30 to 35%. The  left ventricle has moderately decreased function. The left ventricle  demonstrates global hypokinesis. Left ventricular diastolic parameters are  consistent with Grade I diastolic  dysfunction (impaired  relaxation). Elevated left atrial pressure. The  average left ventricular global longitudinal strain is -14.0 %. The global  longitudinal strain is abnormal.  2. Right ventricular systolic function is normal. The right ventricular  size is normal. There is normal pulmonary artery systolic pressure. The  estimated right ventricular systolic pressure is 07.6 mmHg.  3. The mitral valve is normal in structure. Mild mitral valve  regurgitation. No evidence of mitral stenosis. Moderate mitral annular  calcification.  4. The aortic valve is calcified. There is mild calcification of the  aortic valve. There is mild thickening of the aortic valve. Aortic valve  regurgitation is mild. Mild to moderate aortic valve  sclerosis/calcification is present, without any evidence  of aortic stenosis. Aortic regurgitation PHT measures 374 msec. Aortic  valve mean gradient measures 6.0 mmHg. Aortic valve Vmax measures 1.75  m/s.  5. Aortic dilatation noted. There is mild dilatation of the ascending  aorta, measuring 39 mm.  6. The inferior vena cava is dilated in size with <50% respiratory  variability, suggesting right atrial pressure of 15 mmHg.   Echo 03/25/20 1. Left ventricular ejection fraction, by estimation, is 55 to 60%. The  left ventricle has normal function. The left ventricle has no regional  wall motion abnormalities. Left ventricular diastolic function could not  be evaluated.  2. Right ventricular systolic function is normal. The right ventricular  size is mildly enlarged.  3. The mitral valve is degernerative. There is moderate thickening of the  mitral valve leaflet(s). Normal mobility of the mitral valve leaflets.  Moderate mitral annular calcification.  4. The aortic valve is normal in structure. Aortic valve regurgitation is  mild. No aortic stenosis is present.  5. The inferior vena cava is normal in size with greater than 50%  respiratory variability, suggesting right  atrial pressure of 3 mmHg.  Patient Profile     84 y.o. female with a hx of hypertension, hypothyroidism,LBBB,anemia, osteoporosis, recurrent [name] is being seen today  for the evaluation of decreased EFat the request of Dr. Cruzita Lederer.  Assessment & Plan    1. New Cardiomyopathy/Acute on chronic combined heart failure: -Unknown etiology of cardiomyopathy>>admitted for sepsis -Echo this admission showed LVEF 30-35%, G1DD, abnormal global longitudinal strain, mild MR, mild AI, mild aortic dilation 58mm, dilated IVF -No prior ischemic history -GDMT initially deferred due to hypotension however losartan has been restarted -Weight, down 2lbs to 114lbs today>>down 13lbs from admit (127lb on admission) -I&O, put out 1.45L yesterday and net + 1L since admit -SCr stable at 0.86 and K+ 3.8 yesterday and bmet pending today -does not appear volume overloaded on exam today but complains of SOB laying in bed>unclear whether related to anxiety>>lungs are clear and no edema in legs>>will get a Cxray and BNP and check ddimer>>if these are normal then no further recs -change Losartan to Entresto 24-51mg  BID today -continue lasix 40mg  daily -add low dose spiro as outpt -Would keep off home amlodipine and hydralazine while uptitrating HF meds.  -Will need outpt ischemic workup with Lexiscan myoview.  -BMET in 1 week -followup TOC visit in our office with Dr. Meda Coffee in 7-10 days  2. HTN: -PTA amlodipine 5mg  and hydralazine 25mg  TID>>held for soft pressures -continue Toprol XL 50mg  daily and changing Losartan to Entresto  3. Sepsis/Recurrent UTIs/possible PNA: -CXR with possible PNA -CT abdomen unremarkable -Cultures remain negative  -Abx per IM   4. Respiratory failure -Felt to be secondary to mod-large pleural effusions  -Continue Lasix PO  5.  Nonsustained atrial tachycardia -short burst of ATach last night but just PVCs and PACs now -continue Toprol XL 50mg  daily  I have spent a total  of 35 minutes with patient reviewing 2D echo , telemetry, EKGs, labs and examining patient as well as establishing an assessment and plan that was discussed with the patient.  > 50% of time was spent in direct patient care.    [facility] will sign off if cxray and labs ok.   Medication Recommendations:  Entresto 24-26mg  BID, Toprol XL 50mg  daily, Lasix 40mg  daily Other recommendations (labs, testing, etc):  BMET 1 week Follow up as an outpatient:  Dr. Meda Coffee TOC 7-10 days  Signed, Fransico Him, MD [facility] Pager: [phone] 06/18/2020, 8:23 AM     For questions or updates, please contact   Please consult [email or web address] for contact info under Cardiology/STEMI.

## 2020-06-18 NOTE — Progress Notes (Signed)
Physical Therapy Treatment Patient Details Name: Christy Wade MRN: [id number] DOB: 1935-11-22 Today's Date: 06/18/2020    History of Present Illness [name] is a 84 y.o. female with a past medical history of generalized anxiety disorder, essential hypertension, hypothyroidism, anemia, osteoporosis ,  h/o Rt 7-9 rib fractures and Rt shoulder AC joint seperated shoudler in 9/21  who was brought into the hospital 06/10/20 due to hallucinations , being treated for UTI,In the emergency department patient was  hypothermic with borderline low blood pressures,elevated lactic acid level. SOB overnight 11/24 and placed on @ L O2    PT Comments    Progressing with mobility. Pt is anxious about various things. She c/o "I can't breathe" during session. O2: 94% on RA, HR 87 bpm during session. Cues for slow, deep breaths for calming. Pt agreeable to work with therapy. She tolerated session well. She remains weak and fatigues fairly easily. Continue to recommend ST SNF rehab to improve strength, activity tolerance, and to help pt regain PLOF and functional independence.    Follow Up Recommendations  SNF     Equipment Recommendations  None recommended by PT    Recommendations for Other Services       Precautions / Restrictions Precautions Precautions: Fall Precaution Comments: R AC separation, right rib fxs in September; RUE still painful to raise Restrictions Weight Bearing Restrictions: No    Mobility  Bed Mobility Overal bed mobility: Needs Assistance Bed Mobility: Supine to Sit     Supine to sit: Min guard;HOB elevated Sit to supine: HOB elevated;Min guard   General bed mobility comments: Cues for safety, technique. Increased time. Pt relied on bedrail. Seated rest break needed/taken.  Transfers Overall transfer level: Needs assistance Equipment used: Rolling walker (2 wheeled) Transfers: Sit to/from Stand   Stand pivot transfers: Min assist;From elevated surface        General transfer comment: Assist to power up, stabilize, control descent. Increased time. Cues for safety, hand placement. Unsteady and weak.  Ambulation/Gait Ambulation/Gait assistance: Min assist Gait Distance (Feet): 15 Feet Assistive device: Rolling walker (2 wheeled) Gait Pattern/deviations: Step-through pattern;Decreased stride length;Trunk flexed     General Gait Details: Assist to stabilize pt and manage RW. Cues for safety, posture, RW proximity, pacing.  Pt remains weak and fatigues quickly. O2 94% on RA, HR 87 bpm. Would need +2 for chair follow for longer distance.   Stairs             Wheelchair Mobility    Modified Rankin (Stroke Patients Only)       Balance Overall balance assessment: Needs assistance;History of Falls         Standing balance support: Bilateral upper extremity supported Standing balance-Leahy Scale: Poor                              Cognition Arousal/Alertness: Awake/alert Behavior During Therapy: Anxious Overall Cognitive Status: No family/caregiver present to determine baseline cognitive functioning                                 General Comments: tends to perseverate on various things; anxiety about breathing (sats, HR WNL)      Exercises      General Comments        Pertinent Vitals/Pain Pain Assessment: Faces Faces Pain Scale: No hurt    Home Living  Prior Function            PT Goals (current goals can now be found in the care plan section) Progress towards PT goals: Progressing toward goals    Frequency    Min 2X/week      PT Plan Current plan remains appropriate    Co-evaluation              AM-PAC PT "6 Clicks" Mobility   Outcome Measure  Help needed turning from your back to your side while in a flat bed without using bedrails?: A Little Help needed moving from lying on your back to sitting on the side of a flat bed without using  bedrails?: A Little Help needed moving to and from a bed to a chair (including a wheelchair)?: A Little Help needed standing up from a chair using your arms (e.g., wheelchair or bedside chair)?: A Little Help needed to walk in hospital room?: A Little Help needed climbing 3-5 steps with a railing? : A Lot 6 Click Score: 17    End of Session Equipment Utilized During Treatment: Gait belt Activity Tolerance: Patient tolerated treatment well Patient left: in bed;with call bell/phone within reach;with bed alarm set   PT Visit Diagnosis: Muscle weakness (generalized) (M62.81);Difficulty in walking, not elsewhere classified (R26.2);Unsteadiness on feet (R26.81)     Time: 9675-9163 PT Time Calculation (min) (ACUTE ONLY): 16 min  Charges:  $Gait Training: 8-22 mins              Doreatha Massed, PT Acute Rehabilitation  Office: [phone] Pager: [phone]

## 2020-06-18 NOTE — Progress Notes (Signed)
Occupational Therapy Treatment Patient Details Name: Christy Wade MRN: [id number] DOB: [birth year] Today's Date: 06/18/2020    History of present illness Christy Wade is a 84 y.o. female with a past medical history of generalized anxiety disorder, essential hypertension, hypothyroidism, anemia, osteoporosis ,  h/o Rt 7-9 rib fractures and Rt shoulder AC joint seperated shoudler in 9/21  who was brought into the hospital 06/10/20 due to hallucinations , being treated for UTI,In the emergency department patient was  hypothermic with borderline low blood pressures,elevated lactic acid level. SOB overnight 11/24 and placed on @ L O2   OT comments  Treatment focused on improving functional mobility and performing ADLs out of bed. Patient min assist for supine to sit, sit to stand and short ambulation to sink with RW. Patient initially stood a sink for approx 10 seconds before reporting she needed to urinate. Therapist placed BSC behind her - patient standing approx 20 seconds at sink - before requiring min assist to step back and control descent onto [facility]. Patient max assist for toileting for clothing management but patient able to wipe herself in seated position. After toileting patient stood at sink approx 2 minutes to perform grooming tasks before reporting needing to sit down and rest. Patient took steps backward, somewhat unsafely, with RW to sit on edge of bed. Patient reporting weakness and nausea. Patient recovered at side of bed and acquiesced to sitting in recliner for lunch. Min assist to pivot to recliner. Patient is making progress despite continues complaints of weakness. Recommend short term rehab at discharge to maximize patient's functional abilities prior to return home.    Follow Up Recommendations  SNF    Equipment Recommendations  None recommended by OT    Recommendations for Other Services      Precautions / Restrictions Precautions Precautions: Fall Precaution Comments: R  AC separation, right rib fxs in September; RUE still painful to raise Restrictions Weight Bearing Restrictions: No       Mobility Bed Mobility Overal bed mobility: Needs Assistance Bed Mobility: Supine to Sit     Supine to sit: Min assist;HOB elevated Sit to supine: HOB elevated;Min guard   General bed mobility comments: Min assist to scoot to edge of bed. Patient using being rail and requiring increased time. Patietn guarding RUE during transfer and RUE does not assist with transfer  Transfers Overall transfer level: Needs assistance Equipment used: Rolling walker (2 wheeled) Transfers: Sit to/from [facility] Sit to Stand: Min assist;From elevated surface Stand pivot transfers: Min assist;From elevated surface       General transfer comment: Min assist to power up from seated position and controlling descent. Patient unable to use RUE for sit to stand and unable to position RUE onto walking until in standing position due to pain and decreased ROM.    Balance Overall balance assessment: Needs assistance;History of Falls Sitting-balance support: No upper extremity supported;Feet supported Sitting balance-Leahy Scale: Fair     Standing balance support: Bilateral upper extremity supported Standing balance-Leahy Scale: Poor Standing balance comment: reliant on UEs on walker.                           ADL either performed or assessed with clinical judgement   ADL       Grooming: Wash/dry hands;Wash/dry face;Brushing hair;Standing;Min guard Grooming Details (indicate cue type and reason): min guard to stand to perform grooming tasks at sink. Patient bent over on elbows at sink  to perform task. Patient performed tasks quickly with less than ideal quality due to poor activity tolerance and requesting to sit down.             Lower Body Dressing: Sit to/from stand Lower Body Dressing Details (indicate cue type and reason): Total assist to don  socks. Toilet Transfer: Minimal assistance;BSC;Ambulation;RW Armed forces technical officer Details (indicate cue type and reason): Patient standing at sink. Backed up to [facility] with RW. Min assist to assist with sit to stand and control descent into sitting. Toileting- Clothing Manipulation and Hygiene: Maximal assistance;Sit to/from stand;Sitting/lateral lean Toileting - Clothing Manipulation Details (indicate cue type and reason): Max assist for clothing management. ABle to wipe self in seated position be leaning.             Vision Patient Visual Report: No change from baseline Vision Assessment?: No apparent visual deficits   Perception     Praxis      Cognition Arousal/Alertness: Awake/alert Behavior During Therapy: WFL for tasks assessed/performed Overall Cognitive Status: No family/caregiver present to determine baseline cognitive functioning Area of Impairment: Memory                     Memory: Decreased short-term memory         General Comments: tends to perseverate on various things; anxiety about breathing (sats, HR WNL)        Exercises Other Exercises Other Exercises: Educated patient to self range RUE with LUE - patient able to perform to approx 45 degrees. She reports pain with ROM.   Shoulder Instructions       General Comments      Pertinent Vitals/ Pain       Pain Assessment: Faces Faces Pain Scale: Hurts little more Pain Location: R proximal humerus (with lifting) Pain Descriptors / Indicators: Discomfort;Sore;Burning;Grimacing;Aching Pain Intervention(s): Limited activity within patient's tolerance  Home Living                                          Prior Functioning/Environment              Frequency  Min 2X/week        Progress Toward Goals  OT Goals(current goals can now be found in the care plan section)  Progress towards OT goals: Progressing toward goals  Acute Rehab OT Goals Patient Stated Goal: get  stronger OT Goal Formulation: With patient Time For Goal Achievement: 06/25/20 Potential to Achieve Goals: Good  Plan Discharge plan remains appropriate    Co-evaluation          OT goals addressed during session: ADL's and self-care;Strengthening/ROM (functional mobility)      AM-PAC OT "6 Clicks" Daily Activity     Outcome Measure   Help from another person eating meals?: A Little Help from another person taking care of personal grooming?: A Little Help from another person toileting, which includes using toliet, bedpan, or urinal?: A Lot Help from another person bathing (including washing, rinsing, drying)?: A Lot Help from another person to put on and taking off regular upper body clothing?: A Lot Help from another person to put on and taking off regular lower body clothing?: Total 6 Click Score: 13    End of Session Equipment Utilized During Treatment: Gait belt;Rolling walker  OT Visit Diagnosis: Unsteadiness on feet (R26.81);Other abnormalities of gait and mobility (R26.89);Pain Pain - Right/Left: Right  Pain - part of body: Shoulder   Activity Tolerance Patient limited by fatigue   Patient Left in chair;with call bell/phone within reach;with chair alarm set   Nurse Communication Mobility status        Time: 9324-1991 OT Time Calculation (min): 19 min  Charges: OT General Charges $OT Visit: 1 Visit OT Treatments $Self Care/Home Management : 8-22 mins  Derl Barrow, OTR/L [place]  Office [phone] Pager: [place] 06/18/2020, 12:21 PM

## 2020-06-18 NOTE — Progress Notes (Signed)
Nutrition Follow-up  RD working remotely.  DOCUMENTATION CODES:   Not applicable  INTERVENTION:  - continue Ensure Enlive BID, each supplement provides 350 kcal and 20 grams of protein. - will order Magic Cup with lunch meals, each supplement provides 290 kcal and 9 grams of protein. - will order 30 ml Prosource Plus once/day, each supplement provides 100 kcal and 15 grams protein.  - complete NFPE at follow-up.   NUTRITION DIAGNOSIS:   Increased nutrient needs related to acute illness as evidenced by estimated needs. -ongoing  GOAL:   Patient will meet greater than or equal to 90% of their needs -unmet on average  MONITOR:   PO intake, Supplement acceptance, Labs, Weight trends  ASSESSMENT:   84 year old female with medical history of GAD, HTN, hypothyroidism, anemia, and osteoporosis. Patient presented to the ED due to hallucinations that started over 24 hours prior. It was reported that over the past 4-5 months she has had recurrent UTIs associated with significant weakness and several falls. In the ED, she was found to be hypothermic.  She has been eating variably over the past 3 days: 2-100%. She has been accepting Ensure ~50% of the time offered over the past week so will order additional oral nutrition supplements.  She is on daily oral lasix and weight is -13 lb in the past 8 days. Non-pitting edema to BLE documented in the edema section of flow sheet.   Cardiology last saw patient earlier this AM.  Discharge order and discharge summary for d/c to SNF were entered yesterday; continues to await SNF placement.    Labs reviewed; Na: 132 mmol/l, Cl: 91 mmol/l, Ca: 8.4 mg/dl. Medications reviewed; 40 mg oral lasix/day, 88 mcg oral synthroid/day, 40 mg oral protonix/day, 40 mEq oral KCl/day, 250 mg florastor BID.      NUTRITION - FOCUSED PHYSICAL EXAM:  unable to complete at this time.   Diet Order:   Diet Order            DIET SOFT Room service appropriate? Yes;  Fluid consistency: Thin  Diet effective now                 EDUCATION NEEDS:   No education needs have been identified at this time  Skin:  Skin Assessment: Reviewed RN Assessment  Last BM:  11/30 (type 6)  Height:   Ht Readings from Last 1 Encounters:  06/10/20 5\' 7"  (1.702 m)    Weight:   Wt Readings from Last 1 Encounters:  06/18/20 51.9 kg     Estimated Nutritional Needs:  Kcal:  1600-1800 kcal Protein:  70-85 grams Fluid:  >/= 1.8 L/day      Jarome Matin, MS, RD, LDN, CNSC Inpatient Clinical Dietitian RD pager # available in [facility]  After hours/weekend pager # available in [facility]

## 2020-06-18 NOTE — Progress Notes (Signed)
PROGRESS NOTE  Christy TISDELL DXA:[id number] DOB: October 04, 1935 DOA: 06/10/2020 PCP: Luetta Nutting, DO   LOS: 8 days   Brief Narrative / Interim history: 84 year old female with GAD, hypertension, hypothyroidism, anemia, osteoporosis was brought to the hospital due to hallucinations that started over 24 hours prior to admission.  Apparently over the last 4 to 5 months she has been getting recurrent UTIs associated with significant weakness and several falls.  Prior cultures showed Klebsiella in August, E. coli in October, most recently Enterobacter in November.  She was seen in the ED few days ago for which she was prescribed Levaquin, hallucinations started a day afterwards.  She was hypothermic in the ED with borderline low blood pressure, she was found to have elevated lactic acid and admitted to the hospital  Subjective / 24h Interval events: She has no new complaints today except requesting for help to get back to the facility.  Assessment & Plan: Principal Problem Acute metabolic encephalopathy with hallucinations-possibly due to fluoroquinolone.  No focal deficits. CT head done a few days ago did not show any acute findings, however given recurrent falls and decline I have obtained an MRI which was also negative for acute findings -Cultures have remained negative.  Completed antibiotics Patient is clinically stable and ambulating with physical therapy. Plan for possible discharge home tomorrow.  Active Problems Sepsis due to unspecified organism/hypothermia -Possibly due to UTI, chest x-ray also raise possibility for pneumonia.  Cultures have remained negative.  She was treated with 3 days of Levaquin and 4 days of cefepime for total of 7 days, unknown source.  CT scan of the chest did not confirm pneumonia but did show pleural effusions which can explain her hypoxia.  Sepsis has resolved  Recurrent UTIs -Reason not entirely clear, CT scan of the abdomen without any significant  findings to explain recurrent UTIs.  Monitor clinically for now  Acute on chronic heart failure with reduced ejection fraction.  NYHA III-IV, ACC/AHA C -2D echo done 11/25 shows an EF of 30-35% which is significantly lower than the one done 2 months ago.  Cardiology consulted, appreciate input.  Suspect this is contributing to her recurrent weakness at home and falls.  Continue with guideline directed medical therapy with aspirin, statin, beta-blocker and ACE inhibitor Cardiology following  0 Acute hypoxic respiratory failure -Likely in the setting of pleural effusions in the setting of new onset systolic CHF.  Weaned off to room air, appears comfortable  Abnormal imaging of the right shoulder-x-ray shows large area of cortical lucency in the medial aspect of the right humeral head, unclear if new or chronic.  Apparently Dr. Griffin Basil was aware of this back in August, based on PCP notes it appears that patient is not a surgical candidate.  Outpatient follow-up  Rib fractures on the right-likely due to previous falls, no significant pain.  Possibly causing atelectasis on chest x-ray  Essential hypertension-currently on IV Lasix, losartan added by cardiology for her CHF, she seems to be tolerating well her blood pressure is stable this morning  Hypothyroidism-continue Synthroid.  TSH is normal at 3.15  Normocytic anemia- Some drop in hemoglobin noted compared to her baseline which seems to be around 11.  No overt bleeding noted.  Anemia panel with slightly low iron levels  Hyponatremia and hypokalemia -hyponatremia has a chronic component, back in August it was 111.  Thought to be due to volume depletion back then perhaps a component of SIADH.  Sodium has remained stable. Replete potassium  Disposition  possible discharge to SNF tomorrow 06/19/2020  Scheduled Meds: . (feeding supplement) PROSource Plus  30 mL Oral Daily  . cycloSPORINE  1 drop Both Eyes BID  . enoxaparin (LOVENOX) injection  40  mg Subcutaneous Q24H  . feeding supplement  237 mL Oral BID BM  . furosemide  40 mg Oral Daily  . influenza vaccine adjuvanted  0.5 mL Intramuscular Tomorrow-1000  . levothyroxine  88 mcg Oral QAC breakfast  . metoprolol succinate  50 mg Oral Daily  . multivitamin with minerals  1 tablet Oral Daily  . pantoprazole  40 mg Oral Daily  . potassium chloride  40 mEq Oral Daily  . saccharomyces boulardii  250 mg Oral BID  . sacubitril-valsartan  1 tablet Oral BID   Continuous Infusions:  PRN Meds:.acetaminophen **OR** acetaminophen, hydrALAZINE, ipratropium-albuterol, ondansetron **OR** ondansetron (ZOFRAN) IV  Diet Orders (From admission, onward)    Start     Ordered   06/12/20 1644  DIET SOFT Room service appropriate? Yes; Fluid consistency: Thin  Diet effective now       Question Answer Comment  Room service appropriate? Yes   Fluid consistency: Thin      06/12/20 1643          DVT prophylaxis: enoxaparin (LOVENOX) injection 40 mg Start: 06/10/20 2200     Code Status: DNR  Family Communication: daughter over the phone  Status is: Inpatient  Remains inpatient appropriate because:IV treatments appropriate due to intensity of illness or inability to take PO and Inpatient level of care appropriate due to severity of illness   Dispo: The patient is from: Home              Anticipated d/c is to: SNF              Anticipated d/c date is: 2 days              Patient currently medically stable to be discharged to SNF tomorrow 06/19/2020  Consultants:  Cardiology  Procedures:  None   Microbiology  Repeat cultures no growth   Antimicrobials: Cefepime 11/23 >> 11/27   Objective: Vitals:   06/17/20 2050 06/18/20 0457 06/18/20 0824 06/18/20 1229  BP: 120/66 131/61 122/78 118/81  Pulse: 85 (!) 54 83 90  Resp: 18 18 19 16   Temp: 98.4 F (36.9 C) 97.7 F (36.5 C) 98.3 F (36.8 C) 97.7 F (36.5 C)  TempSrc: Oral Oral Oral Oral  SpO2: 97% 97% 95% 96%  Weight:  51.9 kg     Height:        Intake/Output Summary (Last 24 hours) at 06/18/2020 1607 Last data filed at 06/18/2020 0900 Gross per 24 hour  Intake 600 ml  Output 1100 ml  Net -500 ml   Filed Weights   06/16/20 0600 06/17/20 0443 06/18/20 0457  Weight: 52.3 kg 53 kg 51.9 kg    Examination:  Constitutional: NAD, in bed Eyes: No scleral icterus ENMT: Moist mucous membranes Neck: normal, supple Respiratory: Diminished at the bases, clear, no wheezing Cardiovascular: Regular rate and rhythm, no murmurs, trace edema Abdomen: Soft, NT, ND, bowel sounds positive Musculoskeletal: no clubbing / cyanosis.  Skin: No rash seen Neurologic: Nonfocal, equal strength  Data Reviewed: I have independently reviewed following labs and imaging studies   CBC: Recent Labs  Lab 06/12/20 0409 06/13/20 0449 06/14/20 0445 06/15/20 0553 06/16/20 0410  WBC 6.5 5.7 5.5 5.2 5.2  HGB 8.1* 8.3* 9.1* 9.0* 9.8*  HCT 25.6* 27.4* 30.7* 29.0* 31.6*  MCV 93.1 96.1 97.8 93.5 93.5  PLT 264 242 226 231 330   Basic Metabolic Panel: Recent Labs  Lab 06/13/20 0449 06/14/20 0445 06/15/20 0553 06/16/20 0410 06/17/20 0503  NA 133* 136 134* 134* 132*  K 3.6 4.2 3.5 3.7 3.8  CL 97* 96* 92* 92* 91*  CO2 28 31 33* 33* 31  GLUCOSE 87 93 83 80 77  BUN 12 14 13 14 13   CREATININE 0.97 0.79 0.82 0.95 0.86  CALCIUM 7.6* 8.0* 8.1* 8.3* 8.4*   Liver Function Tests: Recent Labs  Lab 06/12/20 0409 06/13/20 0449 06/14/20 0445 06/15/20 0553  AST 19 20 26 23   ALT 16 14 18 16   ALKPHOS 70 70 73 78  BILITOT 0.5 0.5 0.3 0.4  PROT 5.4* 5.3* 5.6* 5.5*  ALBUMIN 2.3* 2.1* 2.3* 2.2*   Coagulation Profile: No results for input(s): INR, PROTIME in the last 168 hours. HbA1C: No results for input(s): HGBA1C in the last 72 hours. CBG: No results for input(s): GLUCAP in the last 168 hours.  Recent Results (from the past 240 hour(s))  Culture, blood (routine x 2)     Status: None   Collection Time: 06/10/20 12:34 PM    Specimen: BLOOD  Result Value Ref Range Status   Specimen Description   Final    BLOOD LEFT WRIST Performed at [facility] [address] [place], [place] [address]    Special Requests   Final    BOTTLES DRAWN AEROBIC AND ANAEROBIC Blood Culture adequate volume Performed at [facility] [address], [place], [place] [address]    Culture   Final    NO GROWTH 5 DAYS Performed at [facility], [place] [address] [place], [place] [address]    Report Status 06/15/2020 FINAL  Final  Culture, blood (routine x 2)     Status: None   Collection Time: 06/10/20 12:34 PM   Specimen: BLOOD  Result Value Ref Range Status   Specimen Description   Final    BLOOD RIGHT ANTECUBITAL Performed at [facility] [address], [place], [place] [address]    Special Requests   Final    BOTTLES DRAWN AEROBIC AND ANAEROBIC Blood Culture results may not be optimal due to an excessive volume of blood received in culture bottles Performed at [facility] [address] [place], [place] [address]    Culture   Final    NO GROWTH 5 DAYS Performed at [facility], [place] [address] [place], [place] [address]    Report Status 06/15/2020 FINAL  Final  Resp Panel by RT-PCR (Flu A&B, Covid) Nasopharyngeal Swab     Status: None   Collection Time: 06/10/20  4:57 PM   Specimen: Nasopharyngeal Swab; Nasopharyngeal(NP) swabs in vial transport medium  Result Value Ref Range Status   SARS Coronavirus 2 by RT PCR NEGATIVE NEGATIVE Final    Comment: (NOTE) SARS-CoV-2 target nucleic acids are NOT DETECTED.  The SARS-CoV-2 RNA is generally detectable in upper respiratory specimens during the acute phase of infection. The lowest concentration of SARS-CoV-2 viral copies this assay can detect is 138 copies/mL. A negative result does not preclude SARS-Cov-2 infection and should not be used as the sole basis for treatment or other  patient management decisions. A negative result may occur with  improper specimen collection/handling, submission of specimen other than nasopharyngeal swab, presence of viral mutation(s) within the areas targeted by this assay, and inadequate number  of viral copies(<138 copies/mL). A negative result must be combined with clinical observations, patient history, and epidemiological information. The expected result is Negative.  Fact Sheet for Patients:  EntrepreneurPulse.com.au  Fact Sheet for Healthcare Providers:  IncredibleEmployment.be  This test is no t yet approved or cleared by the [place] FDA and  has been authorized for detection and/or diagnosis of SARS-CoV-2 by FDA under an Emergency Use Authorization (EUA). This EUA will remain  in effect (meaning this test can be used) for the duration of the COVID-19 declaration under Section 564(b)(1) of the Act, 21 U.S.C.section 360bbb-3(b)(1), unless the authorization is terminated  or revoked sooner.       Influenza A by PCR NEGATIVE NEGATIVE Final   Influenza B by PCR NEGATIVE NEGATIVE Final    Comment: (NOTE) The Xpert Xpress SARS-CoV-2/FLU/RSV plus assay is intended as an aid in the diagnosis of influenza from Nasopharyngeal swab specimens and should not be used as a sole basis for treatment. Nasal washings and aspirates are unacceptable for Xpert Xpress SARS-CoV-2/FLU/RSV testing.  Fact Sheet for Patients: EntrepreneurPulse.com.au  Fact Sheet for Healthcare Providers: IncredibleEmployment.be  This test is not yet approved or cleared by the [place] FDA and has been authorized for detection and/or diagnosis of SARS-CoV-2 by FDA under an Emergency Use Authorization (EUA). This EUA will remain in effect (meaning this test can be used) for the duration of the COVID-19 declaration under Section 564(b)(1) of the Act, 21 U.S.C. section  360bbb-3(b)(1), unless the authorization is terminated or revoked.  Performed at [facility], [place] [address], [place], [place] [address]   Urine culture     Status: None   Collection Time: 06/10/20  5:15 PM   Specimen: Urine, Clean Catch  Result Value Ref Range Status   Specimen Description   Final    URINE, CLEAN CATCH Performed at [facility], [place] [address], [place], [place] [address]    Special Requests   Final    NONE Performed at [facility], [place] [address] [place], [place] [address]    Culture   Final    NO GROWTH Performed at [facility], [place] [address] [place], [place] [address]    Report Status 06/12/2020 FINAL  Final  Resp Panel by RT-PCR (Flu A&B, Covid) Nasopharyngeal Swab     Status: None   Collection Time: 06/17/20  6:10 AM   Specimen: Nasopharyngeal Swab; Nasopharyngeal(NP) swabs in vial transport medium  Result Value Ref Range Status   SARS Coronavirus 2 by RT PCR NEGATIVE NEGATIVE Final    Comment: (NOTE) SARS-CoV-2 target nucleic acids are NOT DETECTED.  The SARS-CoV-2 RNA is generally detectable in upper respiratory specimens during the acute phase of infection. The lowest concentration of SARS-CoV-2 viral copies this assay can detect is 138 copies/mL. A negative result does not preclude SARS-Cov-2 infection and should not be used as the sole basis for treatment or other patient management decisions. A negative result may occur with  improper specimen collection/handling, submission of specimen other than nasopharyngeal swab, presence of viral mutation(s) within the areas targeted by this assay, and inadequate number of viral copies(<138 copies/mL). A negative result must be combined with clinical observations, patient history, and epidemiological information. The expected result is Negative.  Fact Sheet for Patients:  EntrepreneurPulse.com.au  Fact Sheet for  Healthcare Providers:  IncredibleEmployment.be  This test is no t yet approved or cleared by the [place] and  has been authorized for  detection and/or diagnosis of SARS-CoV-2 by FDA under an Emergency Use Authorization (EUA). This EUA will remain  in effect (meaning this test can be used) for the duration of the COVID-19 declaration under Section 564(b)(1) of the Act, 21 U.S.C.section 360bbb-3(b)(1), unless the authorization is terminated  or revoked sooner.       Influenza A by PCR NEGATIVE NEGATIVE Final   Influenza B by PCR NEGATIVE NEGATIVE Final    Comment: (NOTE) The Xpert Xpress SARS-CoV-2/FLU/RSV plus assay is intended as an aid in the diagnosis of influenza from Nasopharyngeal swab specimens and should not be used as a sole basis for treatment. Nasal washings and aspirates are unacceptable for Xpert Xpress SARS-CoV-2/FLU/RSV testing.  Fact Sheet for Patients: EntrepreneurPulse.com.au  Fact Sheet for Healthcare Providers: IncredibleEmployment.be  This test is not yet approved or cleared by the [place] FDA and has been authorized for detection and/or diagnosis of SARS-CoV-2 by FDA under an Emergency Use Authorization (EUA). This EUA will remain in effect (meaning this test can be used) for the duration of the COVID-19 declaration under Section 564(b)(1) of the Act, 21 U.S.C. section 360bbb-3(b)(1), unless the authorization is terminated or revoked.  Performed at [facility], [place] [address], [place], [place] [address]      Radiology Studies: DG Chest 2 View  Result Date: 06/18/2020 CLINICAL DATA:  Shortness of breath. EXAM: CHEST - 2 VIEW COMPARISON:  June 11, 2020. FINDINGS: Stable cardiomegaly. No pneumothorax is noted. Left lung is clear. Small right pleural effusion is noted with associated atelectasis or infiltrate. Right rib fractures are again noted. IMPRESSION: Small  right pleural effusion with associated atelectasis or infiltrate. Electronically Signed   By: Marijo Conception M.D.   On: 06/18/2020 09:35   Total time spent for this encounter is 30 minutes  Leonides Grills, M.D [facility] Pager: [phone]    Between 7 pm - 7 am I am not available, please contact night coverage MD/APP via Amion

## 2020-06-18 NOTE — TOC Progression Note (Addendum)
Transition of Care [facility]) - Progression Note    Patient Details  Name: Christy Wade MRN: [id number] Date of Birth: [birth year]  [facility] CM/SW Contact  Ross Ludwig, [place] Phone Number: 06/18/2020, 1:17 PM  Clinical Narrative:     CSW spoke to patient's daughter and provided choice of facilities.  Patient's daughter said she would like patient to go to [facility].  CSW spoke to [facility], and they can accept patient pending insurance approval.  CSW spoke to [employer], the authorization is still pending.  2:00pm  CSW Received phone call from [employer] requesting details about patient's prior level of functioning.  CSW attempted to call patient's daughter, and left a message on her voice mail.    3:00pm  CSW spoke to patient's daughter, and she updated CSW about patient's prior level of functioning.  CSW spoke patient's insurance company they have approved patient for SNF placement at [facility] effective tomorrow.  Authorization number is [id number].  CSW spoke to [facility] and they can accept patient tomorrow as long as patient is medically ready for discharge.  CSW to continue to facilitate discharge planning.    Expected Discharge Plan and Services  SNF once insurance approves and patient is medically ready for discharge.         Expected Discharge Date: 06/17/20                                     Social Determinants of Health (SDOH) Interventions    Readmission Risk Interventions No flowsheet data found.

## 2020-06-19 DIAGNOSIS — I10 Essential (primary) hypertension: Secondary | ICD-10-CM | POA: Diagnosis not present

## 2020-06-19 DIAGNOSIS — A419 Sepsis, unspecified organism: Secondary | ICD-10-CM | POA: Diagnosis not present

## 2020-06-19 DIAGNOSIS — Z87440 Personal history of urinary (tract) infections: Secondary | ICD-10-CM | POA: Diagnosis not present

## 2020-06-19 DIAGNOSIS — N390 Urinary tract infection, site not specified: Secondary | ICD-10-CM | POA: Diagnosis not present

## 2020-06-19 DIAGNOSIS — I5021 Acute systolic (congestive) heart failure: Secondary | ICD-10-CM | POA: Diagnosis not present

## 2020-06-19 DIAGNOSIS — M6281 Muscle weakness (generalized): Secondary | ICD-10-CM | POA: Diagnosis not present

## 2020-06-19 DIAGNOSIS — I5020 Unspecified systolic (congestive) heart failure: Secondary | ICD-10-CM | POA: Diagnosis not present

## 2020-06-19 DIAGNOSIS — Z7401 Bed confinement status: Secondary | ICD-10-CM | POA: Diagnosis not present

## 2020-06-19 DIAGNOSIS — E46 Unspecified protein-calorie malnutrition: Secondary | ICD-10-CM | POA: Diagnosis not present

## 2020-06-19 DIAGNOSIS — R52 Pain, unspecified: Secondary | ICD-10-CM | POA: Diagnosis not present

## 2020-06-19 DIAGNOSIS — R2681 Unsteadiness on feet: Secondary | ICD-10-CM | POA: Diagnosis not present

## 2020-06-19 DIAGNOSIS — R443 Hallucinations, unspecified: Secondary | ICD-10-CM | POA: Diagnosis not present

## 2020-06-19 DIAGNOSIS — R5381 Other malaise: Secondary | ICD-10-CM | POA: Diagnosis not present

## 2020-06-19 DIAGNOSIS — I420 Dilated cardiomyopathy: Secondary | ICD-10-CM | POA: Diagnosis not present

## 2020-06-19 DIAGNOSIS — E039 Hypothyroidism, unspecified: Secondary | ICD-10-CM | POA: Diagnosis not present

## 2020-06-19 DIAGNOSIS — E876 Hypokalemia: Secondary | ICD-10-CM | POA: Diagnosis not present

## 2020-06-19 DIAGNOSIS — M25511 Pain in right shoulder: Secondary | ICD-10-CM | POA: Diagnosis not present

## 2020-06-19 DIAGNOSIS — M2550 Pain in unspecified joint: Secondary | ICD-10-CM | POA: Diagnosis not present

## 2020-06-19 DIAGNOSIS — Z79899 Other long term (current) drug therapy: Secondary | ICD-10-CM | POA: Diagnosis not present

## 2020-06-19 DIAGNOSIS — R2689 Other abnormalities of gait and mobility: Secondary | ICD-10-CM | POA: Diagnosis not present

## 2020-06-19 DIAGNOSIS — D649 Anemia, unspecified: Secondary | ICD-10-CM | POA: Diagnosis not present

## 2020-06-19 DIAGNOSIS — I5042 Chronic combined systolic (congestive) and diastolic (congestive) heart failure: Secondary | ICD-10-CM | POA: Diagnosis not present

## 2020-06-19 DIAGNOSIS — Z9181 History of falling: Secondary | ICD-10-CM | POA: Diagnosis not present

## 2020-06-19 DIAGNOSIS — R0602 Shortness of breath: Secondary | ICD-10-CM | POA: Diagnosis not present

## 2020-06-19 DIAGNOSIS — G9340 Encephalopathy, unspecified: Secondary | ICD-10-CM | POA: Diagnosis not present

## 2020-06-19 DIAGNOSIS — S43004D Unspecified dislocation of right shoulder joint, subsequent encounter: Secondary | ICD-10-CM | POA: Diagnosis not present

## 2020-06-19 DIAGNOSIS — S2241XD Multiple fractures of ribs, right side, subsequent encounter for fracture with routine healing: Secondary | ICD-10-CM | POA: Diagnosis not present

## 2020-06-19 DIAGNOSIS — M25519 Pain in unspecified shoulder: Secondary | ICD-10-CM | POA: Diagnosis not present

## 2020-06-19 LAB — COMPREHENSIVE METABOLIC PANEL
ALT: 12 U/L (ref 0–44)
AST: 19 U/L (ref 15–41)
Albumin: 2.5 g/dL — ABNORMAL LOW (ref 3.5–5.0)
Alkaline Phosphatase: 85 U/L (ref 38–126)
Anion gap: 8 (ref 5–15)
BUN: 14 mg/dL (ref 8–23)
CO2: 30 mmol/L (ref 22–32)
Calcium: 8.1 mg/dL — ABNORMAL LOW (ref 8.9–10.3)
Chloride: 92 mmol/L — ABNORMAL LOW (ref 98–111)
Creatinine, Ser: 0.84 mg/dL (ref 0.44–1.00)
GFR, Estimated: >60 mL/min (ref >60)
Glucose, Bld: 94 mg/dL (ref 70–99)
Potassium: 3.3 mmol/L — ABNORMAL LOW (ref 3.5–5.1)
Sodium: 130 mmol/L — ABNORMAL LOW (ref 135–145)
Total Bilirubin: 0.7 mg/dL (ref 0.3–1.2)
Total Protein: 5.8 g/dL — ABNORMAL LOW (ref 6.5–8.1)

## 2020-06-19 LAB — CBC
HCT: 33.3 % — ABNORMAL LOW (ref 36.0–46.0)
Hemoglobin: 10.5 g/dL — ABNORMAL LOW (ref 12.0–15.0)
MCH: 29 pg (ref 26.0–34.0)
MCHC: 31.5 g/dL (ref 30.0–36.0)
MCV: 92 fL (ref 80.0–100.0)
Platelets: 226 10*3/uL (ref 150–400)
RBC: 3.62 MIL/uL — ABNORMAL LOW (ref 3.87–5.11)
RDW: 15.2 % (ref 11.5–15.5)
WBC: 4.9 10*3/uL (ref 4.0–10.5)
nRBC: 0 % (ref 0.0–0.2)

## 2020-06-19 MED ORDER — SACUBITRIL-VALSARTAN 24-26 MG PO TABS
1.0000 | ORAL_TABLET | Freq: Two times a day (BID) | ORAL | 0 refills | Status: DC
Start: 2020-06-19 — End: 2023-03-04

## 2020-06-19 MED ORDER — PROSOURCE PLUS PO LIQD: 30.0000 mL | Freq: Every day | ORAL | 0 refills | Status: AC

## 2020-06-19 MED ORDER — ALBUMIN HUMAN 25 % IV SOLN
12.5000 g | Freq: Once | INTRAVENOUS | Status: AC
  Administered 2020-06-19: 12.5 g via INTRAVENOUS
  Filled 2020-06-19 (×2): qty 50

## 2020-06-19 MED ORDER — ALBUMIN HUMAN 25 % IV SOLN: 12.5000 g | Freq: Once | INTRAVENOUS | Status: AC

## 2020-06-19 MED ORDER — POTASSIUM CHLORIDE 20 MEQ PO PACK
20.0000 meq | PACK | Freq: Once | ORAL | Status: AC
  Administered 2020-06-19: 20 meq via ORAL
  Filled 2020-06-19: qty 1

## 2020-06-19 NOTE — Discharge Summary (Signed)
Physician Discharge Summary  Christy Wade YQI:[id number] DOB: 1936/05/09 DOA: 06/10/2020  PCP: Luetta Nutting, DO  Admit date: 06/10/2020 Discharge date: 06/19/2020  Admitted From: home Disposition:  SNF  Recommendations for Outpatient Follow-up:  1. Follow up with cardiology in 1 week 2. Potentially start spironolactone in an outpatient setting  Home Health: none Equipment/Devices: none  Discharge Condition: stable CODE STATUS: DNR Diet recommendation: low sodium  HPI: Per admitting MD, Christy Wade is a 84 y.o. female with a past medical history of generalized anxiety disorder, essential hypertension, hypothyroidism, anemia, osteoporosis who was brought into the hospital due to hallucinations that started over the last 24 hours.  Patient noted to be confused and anxious.  Not a very good historian.  Lot of information was obtained from her daughter.  Apparently ever since August patient has been having UTIs.  She has had falls as a result of these infections.  She has been on multiple courses of antibiotics including Keflex, nitrofurantoin, ciprofloxacin and most recently Levaquin.  Based on information in our system it looks like she has had positive urine cultures Klebsiella back in August, E. coli in October, and most recently Enterobacter on November 15.  There is also positive urine culture for Staph hominis from November 19 although it was an insignificant growth. Patient has had falls as a result of her UTIs.  She did have an abnormal right shoulder x-ray.  She was also found to have multiple rib fractures.  She was prescribed Levaquin during her last ER visit which she has taken for about 3 days now.  Hallucinations started about 1 to 2 days ago.  No falls reported in the last 24 hours.  Once again history is limited from the patient. In the emergency department patient was noted to be hypothermic with borderline low blood pressures.  She was found to have elevated lactic  acid level.  She was thought to have sepsis.  Sepsis protocol was initiated.  Patient will need hospitalization for further management.   Hospital Course / Discharge diagnoses: Principal Problem Acute metabolic encephalopathy with hallucinations-possibly due to fluoroquinolone.  No focal deficits. CT head done a few days ago did not show any acute findings, however given recurrent falls and decline I have obtained an MRI which was also negative for acute findings. Cultures have remained negative.  Completed antibiotics.  With discontinuation of Levaquin her hallucinations have resolved completely and mental status has returned to baseline  Active Problems Sepsis due to unspecified organism/hypothermia -Possibly due to UTI, chest x-ray also raise possibility for pneumonia.  Cultures have remained negative.  She was treated with 3 days of Levaquin and 4 days of cefepime for total of 7 days.  CT scan of the chest did not confirm pneumonia but did show pleural effusions Recurrent UTIs -Reason not entirely clear, CT scan of the abdomen without any significant findings to explain recurrent UTIs.  Monitor clinically for now Acute systolic CHF -2D echo done 11/25 shows an EF of 30-35% which is significantly lower than the one done 2 months ago.  Cardiology consulted, appreciate input.  Suspect this is contributing to her recurrent weakness at home and falls.    Her medication regimen has been changed, her home amlodipine as well as hydralazine were discontinued and she was started on Toprol-XL along with Entresto in addition to her home Lasix.  Patient will be seen by cardiology as an outpatient in a week, she likely needs Myoview as well as addition of  spironolactone in the future. Acute hypoxic respiratory failure -Likely in the setting of pleural effusions in the setting of new onset systolic CHF.  Weaned off to room air, appears comfortable.  Please follow low-sodium diet Abnormal imaging of the right  shoulder-x-ray shows large area of cortical lucency in the medial aspect of the right humeral head, unclear if new or chronic.  Apparently Dr. Griffin Basil was aware of this back in August, based on PCP notes it appears that patient is not a surgical candidate. Outpatient follow-up Rib fractures on the right-likely due to previous falls, no significant pain. Possibly causing atelectasis on chest x-ray Essential hypertension-continue Toprol-XL, Lasix, Entresto Hypothyroidism-continue Synthroid.  TSH is normal at 3.15 Normocytic anemia-stable, no bleeding Hyponatremia and hypokalemia -hyponatremia has a chronic component, back in August it was 111.  Thought to be due to volume depletion back then perhaps a component of SIADH.  Sodium has remained stable.  Sodium stable, continue potassium repletion as an outpatient. Gasps during no changes in her clinical status since 06/17/2020. Patient has brief episode of low blood pressure with systolic in the 90.  Delene Loll was held and a bottle of 25% albumin given. Patient is clinically and hemodynamically stable and is discharged to SNF today 06/19/2020  Discharge Instructions: Palliative will continue to follow Please continue to check blood pressure prior to administration of Entresto due to potential of worsening low blood pressure.  Hold if MAP is less than 65.   Discharge Instructions    Amb Referral to Palliative Care   Complete by: As directed    Diet - low sodium heart healthy   Complete by: As directed    Increase activity slowly   Complete by: As directed      Allergies as of 06/19/2020      Reactions   Codeine Nausea Only   REACTION: nausea   Sulfamethoxazole Rash   REACTION: rash      Medication List    STOP taking these medications   amLODipine 5 MG tablet Commonly known as: Norvasc   hydrALAZINE 25 MG tablet Commonly known as: APRESOLINE   levofloxacin 500 MG tablet Commonly known as: LEVAQUIN   saccharomyces boulardii 250 MG  capsule Commonly known as: Florastor     TAKE these medications   (feeding supplement) PROSource Plus liquid Take 30 mLs by mouth daily.   albumin human 25 % bottle Inject 50 mLs (12.5 g total) into the vein once for 1 dose.   ALPRAZolam 0.25 MG tablet Commonly known as: XANAX Take 1 tablet (0.25 mg total) by mouth at bedtime as needed for anxiety.   Biofreeze 4 % Gel Generic drug: Menthol (Topical Analgesic) Apply 3 oz topically 3 (three) times daily as needed.   CRANBERRY-CALCIUM PO Take 1 tablet by mouth daily.   cycloSPORINE 0.05 % ophthalmic emulsion Commonly known as: RESTASIS Place 1 drop into both eyes 2 (two) times daily.   diphenoxylate-atropine 2.5-0.025 MG tablet Commonly known as: Lomotil Take one tablet 1-2x per day as needed for diarrhea. What changed:   how much to take  how to take this  when to take this  reasons to take this  additional instructions   furosemide 40 MG tablet Commonly known as: LASIX Take 1 tablet (40 mg total) by mouth daily.   levothyroxine 88 MCG tablet Commonly known as: SYNTHROID Take 1 tablet (88 mcg total) by mouth daily before breakfast.   metoprolol succinate 50 MG 24 hr tablet Commonly known as: TOPROL-XL Take 1 tablet (  50 mg total) by mouth daily. Take with or immediately following a meal.   mirtazapine 15 MG tablet Commonly known as: Remeron Take 0.5 tablets (7.5 mg total) by mouth at bedtime. What changed: how much to take   pantoprazole 40 MG tablet Commonly known as: PROTONIX Take 1 tablet (40 mg total) by mouth daily.   potassium chloride 20 MEQ/15ML (10%) Soln Take 30 mLs (40 mEq total) by mouth daily.   sacubitril-valsartan 24-26 MG Commonly known as: ENTRESTO Take 1 tablet by mouth 2 (two) times daily. Please check BP prior to administration and hold if MAP is less than 65       Follow-up Information    [place], Bhavinkumar, PA Follow up on 06/30/2020.   Specialty: Cardiology Why: Please  arrive 15 minutes early for your 3:15PM post hospital cardiology appointment Contact information: [address] [place] [address] [phone]               Consultations:  Cardiology   Procedures/Studies:  DG Chest 1 View  Result Date: 06/11/2020 CLINICAL DATA:  New shortness of breath today. EXAM: CHEST  1 VIEW COMPARISON:  One-view chest x-ray 06/10/2020 FINDINGS: Heart is enlarged. Right pleural effusion and basilar airspace disease is again seen. Aeration of the left lung is improved. Right-sided rib fractures are again noted. Fracture of the coracoid process demonstrates interval healing. IMPRESSION: 1. Stable right pleural effusion and basilar airspace disease. 2. Improved aeration of the left lung. 3. Healing right coracoid process fracture. Electronically Signed   By: San Morelle M.D.   On: 06/11/2020 06:46   DG Chest 2 View  Result Date: 06/18/2020 CLINICAL DATA:  Shortness of breath. EXAM: CHEST - 2 VIEW COMPARISON:  June 11, 2020. FINDINGS: Stable cardiomegaly. No pneumothorax is noted. Left lung is clear. Small right pleural effusion is noted with associated atelectasis or infiltrate. Right rib fractures are again noted. IMPRESSION: Small right pleural effusion with associated atelectasis or infiltrate. Electronically Signed   By: Marijo Conception M.D.   On: 06/18/2020 09:35   DG Chest 2 View  Result Date: 06/06/2020 CLINICAL DATA:  Fall this morning. EXAM: CHEST - 2 VIEW COMPARISON:  05/05/2020 FINDINGS: Lungs are adequately inflated demonstrate continued bibasilar opacification compatible with small effusions with associated atelectasis as these findings may be slightly worse. Linear atelectasis over the anterior mid lungs on the lateral film. Cardiac silhouette is unremarkable. Evidence patient's known multiple acute to subacute right posterolateral rib fractures. Superior subluxation of the distal right clavicle at the [facility] joint unchanged.  Findings suggest fracture involving the right shoulder which is not well evaluated on this exam. IMPRESSION: 1. Bibasilar opacification compatible with small effusions with associated atelectasis slightly worse. 2. Multiple acute to subacute right posterolateral rib fractures unchanged. 3. Suggestion of fracture involving the right shoulder not well evaluated on this exam. Electronically Signed   By: Marin Olp M.D.   On: 06/06/2020 15:30   DG Shoulder Right  Result Date: 06/06/2020 CLINICAL DATA:  Shoulder pain, fall EXAM: RIGHT SHOULDER - 2+ VIEW COMPARISON:  None. FINDINGS: There is a linear lucency seen at the medial aspect of the humeral head which could represent a nondisplaced fracture. Again noted are posterior right rib fractures as on the prior exam. A slightly incongruous AC joint is again noted. Overlying soft tissue swelling. IMPRESSION: Lucency at the medial aspect of the humeral head which may be a incomplete fracture versus overlap of shadows. Electronically Signed   By:  Prudencio Pair M.D.   On: 06/06/2020 15:34   CT Head Wo Contrast  Result Date: 06/06/2020 CLINICAL DATA:  Head trauma. EXAM: CT HEAD WITHOUT CONTRAST TECHNIQUE: Contiguous axial images were obtained from the base of the skull through the vertex without intravenous contrast. COMPARISON:  None. FINDINGS: Brain: No evidence of acute infarction, hemorrhage, hydrocephalus, extra-axial collection or mass lesion/mass effect. Patchy white matter hypoattenuation, likely the sequela of chronic microvascular ischemic disease. Vascular: Calcific atherosclerosis. Skull: Normal. Negative for fracture or focal lesion. Sinuses/Orbits: No acute finding.  Visualized sinuses are clear. Other: No mastoid effusions. IMPRESSION: No evidence of acute intracranial abnormality. Electronically Signed   By: Margaretha Sheffield MD   On: 06/06/2020 16:23   CT ANGIO CHEST PE W OR WO CONTRAST  Result Date: 06/12/2020 CLINICAL DATA:  Shortness of  breath, elevated D-dimer EXAM: CT ANGIOGRAPHY CHEST WITH CONTRAST TECHNIQUE: Multidetector CT imaging of the chest was performed using the standard protocol during bolus administration of intravenous contrast. Multiplanar CT image reconstructions and MIPs were obtained to evaluate the vascular anatomy. CONTRAST:  169mL OMNIPAQUE IOHEXOL 350 MG/ML SOLN COMPARISON:  03/25/2020 FINDINGS: Cardiovascular: Satisfactory opacification of the pulmonary arteries to the segmental level. No evidence of pulmonary embolism. Thoracic aorta is nonaneurysmal. There is tortuosity of the descending thoracic aorta. Three vessel arch with widely patent arch vessels. Atherosclerotic calcification of the aorta. Normal heart size. No pericardial effusion. Mediastinum/Nodes: No axillary, mediastinal, or hilar lymphadenopathy. Esophagus within normal limits. Small hiatal hernia. Lungs/Pleura: Moderate to large right-sided pleural effusion with complete atelectasis/collapse of the right lower lobe. Moderate left-sided pleural effusion with partial left lower lobe atelectasis. No pneumothorax. Upper Abdomen: No acute findings identified. Musculoskeletal: Healing fracture of the base of the right coracoid process. Additional nondisplaced healing fracture at the tip of the coracoid process. Large fracture deformity of the medial aspect of the right humeral head. Large glenohumeral joint effusion with intra-articular fracture fragments interval development of numerous prominent calcifications within the right glenohumeral joint capsule and biceps tendon sheath. Os acromiale. Subacute fractures of the right sixth through eleventh ribs. The posterior eighth and ninth rib fractures appear to extend into the right pleural space posteriorly (series 6, image 70). Exaggerated thoracic kyphosis. Review of the MIP images confirms the above findings. IMPRESSION: 1. No evidence of acute pulmonary embolism. 2. Moderate to large right-sided pleural effusion  with complete atelectasis/collapse of the right lower lobe. 3. Moderate left-sided pleural effusion with partial left lower lobe atelectasis. 4. Subacute fractures of the right sixth through eleventh ribs. The posterior eighth and ninth rib fractures appear to extend into the right pleural space posteriorly. No pneumothorax. 5. Large fracture deformity of the medial aspect of the right humeral head with numerous fracture fragments and dystrophic calcifications within the right glenohumeral joint capsule and biceps tendon sheath. Aortic Atherosclerosis (ICD10-I70.0). Electronically Signed   By: Davina Poke D.O.   On: 06/12/2020 13:09   MR BRAIN WO CONTRAST  Result Date: 06/12/2020 CLINICAL DATA:  Mental status changes.  Unknown cause. EXAM: MRI HEAD WITHOUT CONTRAST TECHNIQUE: Multiplanar, multiecho pulse sequences of the brain and surrounding structures were obtained without intravenous contrast. COMPARISON:  CT head without contrast 06/06/2020 FINDINGS: Brain: No acute infarct, hemorrhage, or mass lesion is present. Moderate generalized atrophy and diffuse white matter disease is present bilaterally. The ventricles are proportionate to the degree of atrophy. No significant white matter lesions are present. The brainstem and cerebellum are within normal limits. Vascular: Flow is present in the major  intracranial arteries. Skull and upper cervical spine: The craniocervical junction is normal. Upper cervical spine is within normal limits. Marrow signal is unremarkable. Sinuses/Orbits: The paranasal sinuses and mastoid air cells are clear. The globes and orbits are within normal limits. IMPRESSION: 1. No acute intracranial abnormality. 2. Moderate generalized atrophy and diffuse white matter disease likely reflects the sequela of chronic microvascular ischemia. Electronically Signed   By: San Morelle M.D.   On: 06/12/2020 17:45   DG Chest Port 1 View  Result Date: 06/10/2020 CLINICAL DATA:   Questionable sepsis, UTI on antibiotics, having hallucinations, unsteady on feet, increased falls, hypertension EXAM: PORTABLE CHEST 1 VIEW COMPARISON:  Portable exam 1307 hours compared to 06/06/2020 FINDINGS: Enlargement of cardiac silhouette. Rotated to the RIGHT with kyphotic positioning. Mediastinal contours and pulmonary vascularity normal. Atelectasis and questionable infiltrate at RIGHT base with associated RIGHT pleural effusion. Minimal subsegmental atelectasis LEFT base. Remaining lungs clear. No pneumothorax. Bones demineralized. IMPRESSION: Atelectasis and small pleural effusion at RIGHT base with questionable associated RIGHT basilar infiltrate. Minimal subsegmental atelectasis LEFT base. Electronically Signed   By: Lavonia Dana M.D.   On: 06/10/2020 13:14   DG Humerus Right  Result Date: 06/06/2020 CLINICAL DATA:  Status post fall. EXAM: RIGHT HUMERUS - 2+ VIEW COMPARISON:  Right shoulder plain films, dated March 05, 2020, and chest CTA, dated March 25, 2020. FINDINGS: A large area of irregular appearing cortical lucency is seen overlying the medial aspect of the right humeral head. This represents a new finding when compared to the prior right shoulder plain films, however, a displaced fracture deformity of the right scapula is noted near this region on the prior chest CTA. Six, seventh and eighth right rib fractures are again seen. There is no evidence of dislocation. Ill-defined, lobulated soft tissue calcification is also seen overlying this region, as well as the adjacent portion of the right scapula. IMPRESSION: 1. Large cortical lucency overlying the right humeral head which may represent sequelae associated with overlying chronic fracture fragments originating from the right scapula. MRI correlation is recommended, as cortical destruction of the right humeral head cannot be excluded. 2. Multiple right-sided rib fractures. Electronically Signed   By: Virgina Norfolk M.D.   On:  06/06/2020 21:34   ECHOCARDIOGRAM COMPLETE  Result Date: 06/12/2020    ECHOCARDIOGRAM REPORT   Patient Name:   Christy Wade Date of Exam: 06/12/2020 Medical Rec #:  [id number]       Height:       67.0 in Accession #:    [id number]      Weight:       127.4 lb Date of Birth:  [birth year]      BSA:          1.670 m Patient Age:    77 years        BP:           108/55 mmHg Patient Gender: F               HR:           68 bpm. Exam Location:  Inpatient Procedure: 2D Echo, 3D Echo, Color Doppler, Cardiac Doppler and Intracardiac            Opacification Agent Indications:    Abnormal ECG 794.31 / R94.31  History:        Patient has prior history of Echocardiogram examinations, most                 recent  03/25/2020. Risk Factors:Hypertension. Thyroid disease.                 Anemia.  Sonographer:    Merrie Roof RDCS Referring Phys: [place]  1. Left ventricular ejection fraction, by estimation, is 30 to 35%. The left ventricle has moderately decreased function. The left ventricle demonstrates global hypokinesis. Left ventricular diastolic parameters are consistent with Grade I diastolic dysfunction (impaired relaxation). Elevated left atrial pressure. The average left ventricular global longitudinal strain is -14.0 %. The global longitudinal strain is abnormal.  2. Right ventricular systolic function is normal. The right ventricular size is normal. There is normal pulmonary artery systolic pressure. The estimated right ventricular systolic pressure is 81.2 mmHg.  3. The mitral valve is normal in structure. Mild mitral valve regurgitation. No evidence of mitral stenosis. Moderate mitral annular calcification.  4. The aortic valve is calcified. There is mild calcification of the aortic valve. There is mild thickening of the aortic valve. Aortic valve regurgitation is mild. Mild to moderate aortic valve sclerosis/calcification is present, without any evidence of aortic stenosis. Aortic  regurgitation PHT measures 374 msec. Aortic valve mean gradient measures 6.0 mmHg. Aortic valve Vmax measures 1.75 m/s.  5. Aortic dilatation noted. There is mild dilatation of the ascending aorta, measuring 39 mm.  6. The inferior vena cava is dilated in size with <50% respiratory variability, suggesting right atrial pressure of 15 mmHg. Comparison(s): Prior images reviewed side by side. The left ventricular function is worsened. FINDINGS  Left Ventricle: Left ventricular ejection fraction, by estimation, is 30 to 35%. The left ventricle has moderately decreased function. The left ventricle demonstrates global hypokinesis. Definity contrast agent was given IV to delineate the left ventricular endocardial borders. The average left ventricular global longitudinal strain is -14.0 %. The global longitudinal strain is abnormal. The left ventricular internal cavity size was normal in size. There is no left ventricular hypertrophy. Left ventricular diastolic parameters are consistent with Grade I diastolic dysfunction (impaired relaxation). Elevated left atrial pressure. Right Ventricle: The right ventricular size is normal. No increase in right ventricular wall thickness. Right ventricular systolic function is normal. There is normal pulmonary artery systolic pressure. The tricuspid regurgitant velocity is 2.29 m/s, and  with an assumed right atrial pressure of 15 mmHg, the estimated right ventricular systolic pressure is 75.1 mmHg. Left Atrium: Left atrial size was normal in size. Right Atrium: Right atrial size was normal in size. Pericardium: There is no evidence of pericardial effusion. Mitral Valve: The mitral valve is normal in structure. Moderate mitral annular calcification. Mild mitral valve regurgitation. No evidence of mitral valve stenosis. Tricuspid Valve: The tricuspid valve is normal in structure. Tricuspid valve regurgitation is mild . No evidence of tricuspid stenosis. Aortic Valve: The aortic valve is  calcified. There is mild calcification of the aortic valve. There is mild thickening of the aortic valve. Aortic valve regurgitation is mild. Aortic regurgitation PHT measures 374 msec. Mild to moderate aortic valve sclerosis/calcification is present, without any evidence of aortic stenosis. Aortic valve mean gradient measures 6.0 mmHg. Aortic valve peak gradient measures 12.2 mmHg. Aortic valve area, by VTI measures 1.35 cm. Pulmonic Valve: The pulmonic valve was normal in structure. Pulmonic valve regurgitation is not visualized. No evidence of pulmonic stenosis. Aorta: Aortic dilatation noted. There is mild dilatation of the ascending aorta, measuring 39 mm. Venous: The inferior vena cava is dilated in size with less than 50% respiratory variability, suggesting right atrial pressure of 15 mmHg.  IAS/Shunts: No atrial level shunt detected by color flow Doppler.  LEFT VENTRICLE PLAX 2D LVIDd:         4.60 cm     Diastology LV PW:         0.80 cm     LV e' medial:    5.00 cm/s LV IVS:        1.00 cm     LV E/e' medial:  14.7 LVOT diam:     1.70 cm     LV e' lateral:   4.68 cm/s LV SV:         44          LV E/e' lateral: 15.7 LV SV Index:   26 LVOT Area:     2.27 cm    2D Longitudinal Strain                            2D Strain GLS Avg:     -14.0 %  LV Volumes (MOD) LV vol s, MOD A2C: 97.9 ml                            3D Volume EF:                            3D EF:        40 %                            LV EDV:       124 ml                            LV ESV:       74 ml                            LV SV:        50 ml RIGHT VENTRICLE RV S prime:     18.10 cm/s TAPSE (M-mode): 2.0 cm LEFT ATRIUM             Index       RIGHT ATRIUM           Index LA Vol (A2C):   65.2 ml 39.02 ml/m RA Area:     17.10 cm LA Vol (A4C):   53.1 ml 31.80 ml/m RA Volume:   44.50 ml  26.65 ml/m LA Biplane Vol: 53.1 ml 31.80 ml/m  AORTIC VALVE AV Area (Vmax):    1.23 cm AV Area (Vmean):   1.35 cm AV Area (VTI):     1.35 cm AV Vmax:            175.00 cm/s AV Vmean:          117.000 cm/s AV VTI:            0.324 m AV Peak Grad:      12.2 mmHg AV Mean Grad:      6.0 mmHg LVOT Vmax:         95.10 cm/s LVOT Vmean:        69.700 cm/s LVOT VTI:          0.193 m LVOT/AV VTI ratio: 0.60 AI PHT:  374 msec  AORTA Ao Root diam: 3.20 cm Ao Asc diam:  3.90 cm MITRAL VALVE                TRICUSPID VALVE MV Area (PHT): 4.86 cm     TR Peak grad:   21.0 mmHg MV Decel Time: 156 msec     TR Vmax:        229.00 cm/s MV E velocity: 73.30 cm/s MV A velocity: 107.00 cm/s  SHUNTS MV E/A ratio:  0.69         Systemic VTI:  0.19 m                             Systemic Diam: 1.70 cm Candee Furbish MD Electronically signed by Candee Furbish MD Signature Date/Time: 06/12/2020/2:01:10 PM    Final    CT RENAL STONE STUDY  Result Date: 06/10/2020 CLINICAL DATA:  Frequent UTI EXAM: CT ABDOMEN AND PELVIS WITHOUT CONTRAST TECHNIQUE: Multidetector CT imaging of the abdomen and pelvis was performed following the standard protocol without IV contrast. COMPARISON:  2016 FINDINGS: Lower chest: Cardiomegaly. Partially imaged right greater than left pleural effusions with adjacent atelectasis/consolidation. Hepatobiliary: No focal liver abnormality is seen. No gallstones, gallbladder wall thickening, or biliary dilatation. Pancreas: Unremarkable. Spleen: Unremarkable. Adrenals/Urinary Tract: Small cyst of the upper pole of the right kidney. No renal calculi hydronephrosis. There are no ureteral calculi identified. The bladder is poorly distended. Stomach/Bowel: Stomach is within normal limits apart from possible small hiatal hernia. Bowel is normal in caliber. There is an anastomosis in the right lower quadrant. Vascular/Lymphatic: Aortic atherosclerosis. No enlarged lymph nodes identified. Reproductive: Status post hysterectomy. No adnexal masses. Other: No ascites.  Mild body wall edema. Musculoskeletal: Degenerative changes of the included spine particularly at L4-L5 and  L5-S1. No acute osseous abnormality. IMPRESSION: No urinary tract calculi. Pyelonephritis is not well evaluated on this noncontrast study. Partially imaged right greater than left pleural effusions with adjacent atelectasis/consolidation. Cardiomegaly. Electronically Signed   By: Macy Mis M.D.   On: 06/10/2020 19:48     Subjective: - no chest pain, shortness of breath, no abdominal pain, nausea or vomiting.   Discharge Exam: BP 101/62 (BP Location: Left Arm)   Pulse 67   Temp 98.8 F (37.1 C) (Oral)   Resp 20   Ht 5\' 7"  (1.702 m)   Wt 52.2 kg   SpO2 96%   BMI 18.02 kg/m   General: Pt is alert, awake, not in acute distress Cardiovascular: RRR, S1/S2 +, no rubs, no gallops Respiratory: CTA bilaterally, no wheezing, no rhonchi Abdominal: Soft, NT, ND, bowel sounds + Extremities: no edema, no cyanosis  The results of significant diagnostics from this hospitalization (including imaging, microbiology, ancillary and laboratory) are listed below for reference.     Microbiology: Recent Results (from the past 240 hour(s))  Culture, blood (routine x 2)     Status: None   Collection Time: 06/10/20 12:34 PM   Specimen: BLOOD  Result Value Ref Range Status   Specimen Description   Final    BLOOD LEFT WRIST Performed at [facility] [address] [place], [place] [address]    Special Requests   Final    BOTTLES DRAWN AEROBIC AND ANAEROBIC Blood Culture adequate volume Performed at [facility] [address], [place], [place] [address]    Culture   Final    NO GROWTH 5 DAYS Performed at [facility], [place] [address] [place], [place] [address]    Report Status 06/15/2020 FINAL  Final  Culture, blood (routine x 2)     Status: None   Collection Time: 06/10/20 12:34 PM   Specimen: BLOOD  Result Value Ref Range Status   Specimen Description   Final    BLOOD RIGHT ANTECUBITAL Performed at [facility]  [address], [place], [place] [address]    Special Requests   Final    BOTTLES DRAWN AEROBIC AND ANAEROBIC Blood Culture results may not be optimal due to an excessive volume of blood received in culture bottles Performed at [facility] [address], [place], [place] [address]    Culture   Final    NO GROWTH 5 DAYS Performed at [facility], [place] [address] [place], [place] [address]    Report Status 06/15/2020 FINAL  Final  Resp Panel by RT-PCR (Flu A&B, Covid) Nasopharyngeal Swab     Status: None   Collection Time: 06/10/20  4:57 PM   Specimen: Nasopharyngeal Swab; Nasopharyngeal(NP) swabs in vial transport medium  Result Value Ref Range Status   SARS Coronavirus 2 by RT PCR NEGATIVE NEGATIVE Final    Comment: (NOTE) SARS-CoV-2 target nucleic acids are NOT DETECTED.  The SARS-CoV-2 RNA is generally detectable in upper respiratory specimens during the acute phase of infection. The lowest concentration of SARS-CoV-2 viral copies this assay can detect is 138 copies/mL. A negative result does not preclude SARS-Cov-2 infection and should not be used as the sole basis for treatment or other patient management decisions. A negative result may occur with  improper specimen collection/handling, submission of specimen other than nasopharyngeal swab, presence of viral mutation(s) within the areas targeted by this assay, and inadequate number of viral copies(<138 copies/mL). A negative result must be combined with clinical observations, patient history, and epidemiological information. The expected result is Negative.  Fact Sheet for Patients:  EntrepreneurPulse.com.au  Fact Sheet for Healthcare Providers:  IncredibleEmployment.be  This test is no t yet approved or cleared by the [place] FDA and  has been authorized for detection and/or diagnosis of SARS-CoV-2 by FDA under an Emergency Use Authorization (EUA). This EUA  will remain  in effect (meaning this test can be used) for the duration of the COVID-19 declaration under Section 564(b)(1) of the Act, 21 U.S.C.section 360bbb-3(b)(1), unless the authorization is terminated  or revoked sooner.       Influenza A by PCR NEGATIVE NEGATIVE Final   Influenza B by PCR NEGATIVE NEGATIVE Final    Comment: (NOTE) The Xpert Xpress SARS-CoV-2/FLU/RSV plus assay is intended as an aid in the diagnosis of influenza from Nasopharyngeal swab specimens and should not be used as a sole basis for treatment. Nasal washings and aspirates are unacceptable for Xpert Xpress SARS-CoV-2/FLU/RSV testing.  Fact Sheet for Patients: EntrepreneurPulse.com.au  Fact Sheet for Healthcare Providers: IncredibleEmployment.be  This test is not yet approved or cleared by the [place] FDA and has been authorized for detection and/or diagnosis of SARS-CoV-2 by FDA under an Emergency Use Authorization (EUA). This EUA will remain in effect (meaning this test can be used) for the duration of the COVID-19 declaration under Section 564(b)(1) of the Act, 21 U.S.C. section 360bbb-3(b)(1), unless the authorization is terminated or revoked.  Performed at [facility], [place] [address], [place],  [address]   Urine culture     Status: None   Collection Time: 06/10/20  5:15  PM   Specimen: Urine, Clean Catch  Result Value Ref Range Status   Specimen Description   Final    URINE, CLEAN CATCH Performed at [facility], [place] [address] [place], [place] [address]    Special Requests   Final    NONE Performed at [facility], [place] [address] [place], [place] [address]    Culture   Final    NO GROWTH Performed at [facility], [place] [address] [place], [place] [address]    Report Status 06/12/2020 FINAL  Final  Resp Panel by RT-PCR (Flu A&B, Covid) Nasopharyngeal Swab     Status: None    Collection Time: 06/17/20  6:10 AM   Specimen: Nasopharyngeal Swab; Nasopharyngeal(NP) swabs in vial transport medium  Result Value Ref Range Status   SARS Coronavirus 2 by RT PCR NEGATIVE NEGATIVE Final    Comment: (NOTE) SARS-CoV-2 target nucleic acids are NOT DETECTED.  The SARS-CoV-2 RNA is generally detectable in upper respiratory specimens during the acute phase of infection. The lowest concentration of SARS-CoV-2 viral copies this assay can detect is 138 copies/mL. A negative result does not preclude SARS-Cov-2 infection and should not be used as the sole basis for treatment or other patient management decisions. A negative result may occur with  improper specimen collection/handling, submission of specimen other than nasopharyngeal swab, presence of viral mutation(s) within the areas targeted by this assay, and inadequate number of viral copies(<138 copies/mL). A negative result must be combined with clinical observations, patient history, and epidemiological information. The expected result is Negative.  Fact Sheet for Patients:  EntrepreneurPulse.com.au  Fact Sheet for Healthcare Providers:  IncredibleEmployment.be  This test is no t yet approved or cleared by the [place] FDA and  has been authorized for detection and/or diagnosis of SARS-CoV-2 by FDA under an Emergency Use Authorization (EUA). This EUA will remain  in effect (meaning this test can be used) for the duration of the COVID-19 declaration under Section 564(b)(1) of the Act, 21 U.S.C.section 360bbb-3(b)(1), unless the authorization is terminated  or revoked sooner.       Influenza A by PCR NEGATIVE NEGATIVE Final   Influenza B by PCR NEGATIVE NEGATIVE Final    Comment: (NOTE) The Xpert Xpress SARS-CoV-2/FLU/RSV plus assay is intended as an aid in the diagnosis of influenza from Nasopharyngeal swab specimens and should not be used as a sole basis for treatment.  Nasal washings and aspirates are unacceptable for Xpert Xpress SARS-CoV-2/FLU/RSV testing.  Fact Sheet for Patients: EntrepreneurPulse.com.au  Fact Sheet for Healthcare Providers: IncredibleEmployment.be  This test is not yet approved or cleared by the [place] FDA and has been authorized for detection and/or diagnosis of SARS-CoV-2 by FDA under an Emergency Use Authorization (EUA). This EUA will remain in effect (meaning this test can be used) for the duration of the COVID-19 declaration under Section 564(b)(1) of the Act, 21 U.S.C. section 360bbb-3(b)(1), unless the authorization is terminated or revoked.  Performed at [facility], [place] [address], [place],  [address]      Labs: Basic Metabolic Panel: Recent Labs  Lab 06/14/20 0445 06/15/20 0553 06/16/20 0410 06/17/20 0503 06/18/20 0904 06/19/20 0419  NA 136 134* 134* 132*  --  130*  K 4.2 3.5 3.7 3.8  --  3.3*  CL 96* 92* 92* 91*  --  92*  CO2 31 33* 33* 31  --  30  GLUCOSE 93 83 80 77  --  94  BUN 14 13 14 13   --  14  CREATININE 0.79 0.82 0.95 0.86  --  0.84  CALCIUM 8.0* 8.1* 8.3* 8.4*  --  8.1*  MG  --   --   --   --  2.0  --   PHOS  --   --   --   --  3.4  --    Liver Function Tests: Recent Labs  Lab 06/13/20 0449 06/14/20 0445 06/15/20 0553 06/19/20 0419  AST 20 26 23 19   ALT 14 18 16 12   ALKPHOS 70 73 78 85  BILITOT 0.5 0.3 0.4 0.7  PROT 5.3* 5.6* 5.5* 5.8*  ALBUMIN 2.1* 2.3* 2.2* 2.5*   CBC: Recent Labs  Lab 06/13/20 0449 06/14/20 0445 06/15/20 0553 06/16/20 0410 06/19/20 0419  WBC 5.7 5.5 5.2 5.2 4.9  HGB 8.3* 9.1* 9.0* 9.8* 10.5*  HCT 27.4* 30.7* 29.0* 31.6* 33.3*  MCV 96.1 97.8 93.5 93.5 92.0  PLT 242 226 231 236 226   CBG: No results for input(s): GLUCAP in the last 168 hours. Hgb A1c No results for input(s): HGBA1C in the last 72 hours. Lipid Profile No results for input(s): CHOL, HDL, LDLCALC, TRIG, CHOLHDL,  LDLDIRECT in the last 72 hours. Thyroid function studies No results for input(s): TSH, T4TOTAL, T3FREE, THYROIDAB in the last 72 hours.  Invalid input(s): FREET3 Urinalysis    Component Value Date/Time   COLORURINE YELLOW 06/10/2020 [place] 06/10/2020 1715   LABSPEC 1.014 06/10/2020 1715   PHURINE 5.0 06/10/2020 1715   GLUCOSEU NEGATIVE 06/10/2020 1715   HGBUR NEGATIVE 06/10/2020 1715   HGBUR negative 03/02/2010 0000   BILIRUBINUR NEGATIVE 06/10/2020 1715   BILIRUBINUR negative 06/02/2020 1456   BILIRUBINUR NEGATIVE 04/16/2019 1000   KETONESUR NEGATIVE 06/10/2020 1715   PROTEINUR NEGATIVE 06/10/2020 1715   UROBILINOGEN 0.2 06/02/2020 1456   UROBILINOGEN 0.2 03/02/2010 0000   NITRITE NEGATIVE 06/10/2020 1715   LEUKOCYTESUR NEGATIVE 06/10/2020 1715    FURTHER DISCHARGE INSTRUCTIONS:   Get Medicines reviewed and adjusted: Please take all your medications with you for your next visit with your Primary MD   Laboratory/radiological data: Please request your Primary MD to go over all hospital tests and procedure/radiological results at the follow up, please ask your Primary MD to get all Hospital records sent to his/her office.   In some cases, they will be blood work, cultures and biopsy results pending at the time of your discharge. Please request that your primary care M.D. goes through all the records of your hospital data and follows up on these results.   Also Note the following: If you experience worsening of your admission symptoms, develop shortness of breath, life threatening emergency, suicidal or homicidal thoughts you must seek medical attention immediately by calling 911 or calling your MD immediately  if symptoms less severe.   You must read complete instructions/literature along with all the possible adverse reactions/side effects for all the Medicines you take and that have been prescribed to you. Take any new Medicines after you have completely  understood and accpet all the possible adverse reactions/side effects.    Do not drive when taking Pain medications or sleeping medications (Benzodaizepines)   Do not take more than prescribed Pain, Sleep and Anxiety Medications. It is not advisable to combine anxiety,sleep and pain medications without talking with your primary care practitioner   Special Instructions: If you have smoked or chewed Tobacco  in the last 2 yrs please stop smoking, stop any regular  Alcohol  and or any Recreational drug use.   Wear Seat belts while driving.   Please note: You were cared for by a hospitalist during your hospital stay. Once you are discharged, your primary care physician will handle any further medical issues. Please note that NO REFILLS for any discharge medications will be authorized once you are discharged, as it is imperative that you return to your primary care physician (or establish a relationship with a primary care physician if you do not have one) for your post hospital discharge needs so that they can reassess your need for medications and monitor your lab values.  Time coordinating discharge: 35 minutes  SIGNED:  Elie Confer, MD 06/19/2020, 1:17 PM

## 2020-06-19 NOTE — TOC Transition Note (Addendum)
Transition of Care [facility]) - CM/SW Discharge Note   Patient Details  Name: Christy Wade MRN: [id number] Date of Birth: 1935-09-30  Transition of Care [facility]) CM/SW Contact:  Ross Ludwig, LCSW Phone Number: 06/19/2020, 2:15 PM   Clinical Narrative:     Patient to be d/c'ed today to [facility] room 107.  Patient and family agreeable to plans will transport via ems RN to call report to [phone].  CSW notified patient's daughter that patient will be discharging today.  CSW requested to have Palliative to follow outpatient, patient's daughter is in agreement.  CSW contacted [facility], they have added patient to the outpatient palliative list.   Final next level of care: Skilled Nursing Facility Barriers to Discharge: Barriers Resolved   Patient Goals and CMS Choice Patient states their goals for this hospitalization and ongoing recovery are:: To go to SNF and then return back home CMS Medicare.gov Compare Post Acute Care list provided to:: Patient Represenative (must comment) Choice offered to / list presented to : Adult Children  Discharge Placement   Existing PASRR number confirmed : 06/16/20          Patient chooses bed at: [facility] Patient to be transferred to facility by: [facility] Name of family member notified: Daughter Christy Wade Patient and family notified of of transfer: 06/19/20  Discharge Plan and Services                                     Social Determinants of Health (SDOH) Interventions     Readmission Risk Interventions No flowsheet data found.

## 2020-06-19 NOTE — Plan of Care (Signed)
  Problem: Education: Goal: Knowledge of General Education information will improve Description: Including pain rating scale, medication(s)/side effects and non-pharmacologic comfort measures Outcome: Progressing   Problem: Health Behavior/Discharge Planning: Goal: Ability to manage health-related needs will improve Outcome: Progressing   Problem: Clinical Measurements: Goal: Respiratory complications will improve Outcome: Progressing Goal: Cardiovascular complication will be avoided Outcome: Progressing   Problem: Activity: Goal: Risk for activity intolerance will decrease Outcome: Progressing   Problem: Nutrition: Goal: Adequate nutrition will be maintained Outcome: Progressing   Problem: Coping: Goal: Level of anxiety will decrease Outcome: Progressing   Problem: Pain Managment: Goal: General experience of comfort will improve Outcome: Completed/Met

## 2020-06-19 NOTE — Care Management Important Message (Signed)
Important Message  Patient Details IM Letter given to the Patient. Name: Christy Wade MRN: [id number] Date of Birth: [birth year]   Medicare Important Message Given:  Yes     Kerin Salen 06/19/2020, 1:04 PM

## 2020-06-19 NOTE — Progress Notes (Signed)
Hydrolog[facility])  Hospital Liaison: RN note         Notified by [facility] manager of patient/family request for [facility] Palliative services at [facility] after discharge.               [place] Palliative team will follow up with patient after discharge.         Please call with any hospice or palliative related questions.         Thank you for this referral.         Farrel Gordon, RN, CCM  [facility] (listed on [place] under Hospice/[facility])    [phone]

## 2020-06-19 NOTE — Plan of Care (Signed)
  Problem: Education: Goal: Knowledge of General Education information will improve Description: Including pain rating scale, medication(s)/side effects and non-pharmacologic comfort measures Outcome: Progressing   Problem: Health Behavior/Discharge Planning: Goal: Ability to manage health-related needs will improve Outcome: Progressing   Problem: Clinical Measurements: Goal: Ability to maintain clinical measurements within normal limits will improve Outcome: Progressing Goal: Diagnostic test results will improve Outcome: Progressing Goal: Respiratory complications will improve Outcome: Progressing Goal: Cardiovascular complication will be avoided Outcome: Progressing   Problem: Activity: Goal: Risk for activity intolerance will decrease Outcome: Progressing   Problem: Nutrition: Goal: Adequate nutrition will be maintained Outcome: Progressing   Problem: Coping: Goal: Level of anxiety will decrease Outcome: Progressing   Problem: Elimination: Goal: Will not experience complications related to bowel motility Outcome: Progressing Goal: Will not experience complications related to urinary retention Outcome: Progressing   Problem: Pain Managment: Goal: General experience of comfort will improve Outcome: Progressing   Problem: Safety: Goal: Ability to remain free from injury will improve Outcome: Progressing   Problem: Skin Integrity: Goal: Risk for impaired skin integrity will decrease Outcome: Progressing   Problem: Fluid Volume: Goal: Hemodynamic stability will improve Outcome: Progressing   Problem: Clinical Measurements: Goal: Diagnostic test results will improve Outcome: Progressing Goal: Signs and symptoms of infection will decrease Outcome: Progressing   Problem: Respiratory: Goal: Ability to maintain adequate ventilation will improve Outcome: Progressing   Problem: Urinary Elimination: Goal: Signs and symptoms of infection will decrease Outcome:  Progressing

## 2020-06-20 DIAGNOSIS — I10 Essential (primary) hypertension: Secondary | ICD-10-CM | POA: Diagnosis not present

## 2020-06-20 DIAGNOSIS — E039 Hypothyroidism, unspecified: Secondary | ICD-10-CM | POA: Diagnosis not present

## 2020-06-20 DIAGNOSIS — D649 Anemia, unspecified: Secondary | ICD-10-CM | POA: Diagnosis not present

## 2020-06-20 DIAGNOSIS — N390 Urinary tract infection, site not specified: Secondary | ICD-10-CM | POA: Diagnosis not present

## 2020-06-23 ENCOUNTER — Ambulatory Visit: Payer: Medicare PPO | Admitting: Family Medicine

## 2020-06-25 DIAGNOSIS — M25511 Pain in right shoulder: Secondary | ICD-10-CM | POA: Diagnosis not present

## 2020-06-25 DIAGNOSIS — I10 Essential (primary) hypertension: Secondary | ICD-10-CM | POA: Diagnosis not present

## 2020-06-25 DIAGNOSIS — E039 Hypothyroidism, unspecified: Secondary | ICD-10-CM | POA: Diagnosis not present

## 2020-06-25 DIAGNOSIS — I5020 Unspecified systolic (congestive) heart failure: Secondary | ICD-10-CM | POA: Diagnosis not present

## 2020-06-25 DIAGNOSIS — N390 Urinary tract infection, site not specified: Secondary | ICD-10-CM | POA: Diagnosis not present

## 2020-06-25 DIAGNOSIS — R5381 Other malaise: Secondary | ICD-10-CM | POA: Diagnosis not present

## 2020-06-30 ENCOUNTER — Ambulatory Visit: Payer: Medicare PPO | Admitting: Physician Assistant

## 2020-06-30 ENCOUNTER — Encounter: Payer: Self-pay | Admitting: Physician Assistant

## 2020-06-30 ENCOUNTER — Encounter: Payer: Self-pay | Admitting: *Deleted

## 2020-06-30 ENCOUNTER — Other Ambulatory Visit: Payer: Self-pay

## 2020-06-30 VITALS — BP 110/62 | HR 74 | Ht 67.0 in | Wt 112.0 lb

## 2020-06-30 DIAGNOSIS — Z79899 Other long term (current) drug therapy: Secondary | ICD-10-CM | POA: Diagnosis not present

## 2020-06-30 DIAGNOSIS — I5042 Chronic combined systolic (congestive) and diastolic (congestive) heart failure: Secondary | ICD-10-CM

## 2020-06-30 DIAGNOSIS — M25511 Pain in right shoulder: Secondary | ICD-10-CM

## 2020-06-30 NOTE — Progress Notes (Addendum)
Cardiology Office Note:    Date:  06/30/2020   ID:  Christy Wade, DOB [birth year], MRN [id number]  PCP:  Luetta Nutting, DO  [facility] Cardiologist:  Ena Dawley, MD  [place] Electrophysiologist:  None   Chief Complaint: Hospital follow up   History of Present Illness:    Christy Wade is a 84 y.o. female with a hx of with a hx of hypertension, hypothyroidism, LBBB, anemia, osteoporosis, recurrent UTIs presents for hospital follow up.   Echo in September 2021 ordered for hypertension showed LVEF 55-60%, RV mildly enlarged, mod MR calcification, mild AI.   Admitted November 2021 for sepsis of unknown organism.  Treated with broad-spectrum antibiotic.  She was found to have new cardiomyopathy with LV function of 30 to 82%, grade 1 diastolic dysfunction, abnormal global longitudinal strain, mild MR, mild AI, mild aortic dilation 20mm, dilated IVF.  No prior cardiac history.  She was treated medically and diuresed well.  She was discharged on Toprol-XL and Entresto.  Presents today with daughter.  She is at facility.  Does physical therapy without any chest discomfort.  For the past 2 days, dealing with right shoulder pain.  She denies chest pain, shortness of breath, dizziness, orthopnea, PND, syncope, lower extremity edema or melena.  Past Medical History:  Diagnosis Date  . Anemia    on iron now  . Cataract   . Hypertension   . Hypothyroidism   . Osteoporosis   . PONV (postoperative nausea and vomiting)   . Thyroid disease   . Varicose vein     Past Surgical History:  Procedure Laterality Date  . ABDOMINAL HYSTERECTOMY  1975   partial  . APPENDECTOMY  1975   with hysterectomy  . HEMORROIDECTOMY     [phone]  . LAPAROSCOPIC SMALL BOWEL RESECTION N/A 11/29/2014   Procedure: LAPAROSCOPIC SMALL INTESTINE RESECTION;  Surgeon: Michael Boston, MD;  Location: [facility];  Service: General;  Laterality: N/A;  . VARICOSE VEIN SURGERY Left 1971    Current  Medications: Current Meds  Medication Sig  . ALPRAZolam (XANAX) 0.25 MG tablet Take 1 tablet (0.25 mg total) by mouth at bedtime as needed for anxiety.  Marland Kitchen ampicillin (PRINCIPEN) 500 MG capsule Take 500 mg by mouth 4 (four) times daily. Start 06/24/20 finish 07/01/20  . CRANBERRY-CALCIUM PO Take 1 tablet by mouth daily.  . cycloSPORINE (RESTASIS) 0.05 % ophthalmic emulsion Place 1 drop into both eyes 2 (two) times daily.  . diphenoxylate-atropine (LOMOTIL) 2.5-0.025 MG tablet Take one tablet 1-2x per day as needed for diarrhea. (Patient taking differently: Take 1 tablet by mouth daily as needed for diarrhea or loose stools.)  . furosemide (LASIX) 40 MG tablet Take 1 tablet (40 mg total) by mouth daily.  . hydroxypropyl methylcellulose / hypromellose (ISOPTO TEARS / GONIOVISC) 2.5 % ophthalmic solution 1 drop.  Marland Kitchen levothyroxine (SYNTHROID) 88 MCG tablet Take 1 tablet (88 mcg total) by mouth daily before breakfast.  . Menthol, Topical Analgesic, (BIOFREEZE) 4 % GEL Apply 3 oz topically 3 (three) times daily as needed.  . metoprolol succinate (TOPROL-XL) 50 MG 24 hr tablet Take 1 tablet (50 mg total) by mouth daily. Take with or immediately following a meal.  . mirtazapine (REMERON) 15 MG tablet Take 0.5 tablets (7.5 mg total) by mouth at bedtime. (Patient taking differently: Take 15 mg by mouth at bedtime.)  . Nutritional Supplements (,FEEDING SUPPLEMENT, PROSOURCE PLUS) liquid Take 30 mLs by mouth daily.  . pantoprazole (PROTONIX) 40 MG tablet Take  1 tablet (40 mg total) by mouth daily.  . potassium chloride 20 MEQ/15ML (10%) SOLN Take 30 mLs (40 mEq total) by mouth daily.  . sacubitril-valsartan (ENTRESTO) 24-26 MG Take 1 tablet by mouth 2 (two) times daily. Please check BP prior to administration and hold if MAP is less than 65     Allergies:   Codeine and Sulfamethoxazole   Social History   Socioeconomic History  . Marital status: Widowed    Spouse name: Not on file  . Number of children:  Not on file  . Years of education: Not on file  . Highest education level: Not on file  Occupational History  . Occupation: Retired  Tobacco Use  . Smoking status: Never Smoker  . Smokeless tobacco: Never Used  Vaping Use  . Vaping Use: Never used  Substance and Sexual Activity  . Alcohol use: No    Alcohol/week: 0.0 standard drinks  . Drug use: No  . Sexual activity: Not Currently  Other Topics Concern  . Not on file  Social History Narrative  . Not on file   Social Determinants of Health   Financial Resource Strain: Low Risk   . Difficulty of Paying Living Expenses: Not hard at all  Food Insecurity: No Food Insecurity  . Worried About Charity fundraiser in the Last Year: Never true  . Ran Out of Food in the Last Year: Never true  Transportation Needs: No Transportation Needs  . Lack of Transportation (Medical): No  . Lack of Transportation (Non-Medical): No  Physical Activity: Inactive  . Days of Exercise per Week: 0 days  . Minutes of Exercise per Session: 0 min  Stress: No Stress Concern Present  . Feeling of Stress : Not at all  Social Connections: Moderately Integrated  . Frequency of Communication with Friends and Family: More than three times a week  . Frequency of Social Gatherings with Friends and Family: More than three times a week  . Attends Religious Services: More than 4 times per year  . Active Member of Clubs or Organizations: Yes  . Attends Archivist Meetings: More than 4 times per year  . Marital Status: Widowed     Family History: The patient's family history includes Bone cancer in her mother; Breast cancer in her sister; Diabetes in her father; Stroke in her father. There is no history of Stomach cancer or Colon cancer.    ROS:   Please see the history of present illness.    All other systems reviewed and are negative.   EKGs/Labs/Other Studies Reviewed:    The following studies were reviewed today:  Echo 06/12/20  1. Left  ventricular ejection fraction, by estimation, is 30 to 35%. The  left ventricle has moderately decreased function. The left ventricle  demonstrates global hypokinesis. Left ventricular diastolic parameters are  consistent with Grade I diastolic  dysfunction (impaired relaxation). Elevated left atrial pressure. The  average left ventricular global longitudinal strain is -14.0 %. The global  longitudinal strain is abnormal.  2. Right ventricular systolic function is normal. The right ventricular  size is normal. There is normal pulmonary artery systolic pressure. The  estimated right ventricular systolic pressure is 68.1 mmHg.  3. The mitral valve is normal in structure. Mild mitral valve  regurgitation. No evidence of mitral stenosis. Moderate mitral annular  calcification.  4. The aortic valve is calcified. There is mild calcification of the  aortic valve. There is mild thickening of the aortic valve. Aortic  valve  regurgitation is mild. Mild to moderate aortic valve  sclerosis/calcification is present, without any evidence  of aortic stenosis. Aortic regurgitation PHT measures 374 msec. Aortic  valve mean gradient measures 6.0 mmHg. Aortic valve Vmax measures 1.75  m/s.  5. Aortic dilatation noted. There is mild dilatation of the ascending  aorta, measuring 39 mm.  6. The inferior vena cava is dilated in size with <50% respiratory  variability, suggesting right atrial pressure of 15 mmHg.   Echo 03/25/20 1. Left ventricular ejection fraction, by estimation, is 55 to 60%. The  left ventricle has normal function. The left ventricle has no regional  wall motion abnormalities. Left ventricular diastolic function could not  be evaluated.  2. Right ventricular systolic function is normal. The right ventricular  size is mildly enlarged.  3. The mitral valve is degernerative. There is moderate thickening of the  mitral valve leaflet(s). Normal mobility of the mitral valve leaflets.   Moderate mitral annular calcification.  4. The aortic valve is normal in structure. Aortic valve regurgitation is  mild. No aortic stenosis is present.  5. The inferior vena cava is normal in size with greater than 50%  respiratory variability, suggesting right atrial pressure of 3 mmHg.  EKG:  EKG is not  ordered today.    Recent Labs: 06/11/2020: TSH 3.150 06/18/2020: B Natriuretic Peptide 149.5; Magnesium 2.0 06/19/2020: ALT 12; BUN 14; Creatinine, Ser 0.84; Hemoglobin 10.5; Platelets 226; Potassium 3.3; Sodium 130  Recent Lipid Panel    Component Value Date/Time   CHOL 152 01/15/2019 1148   TRIG 71.0 01/15/2019 1148   HDL 78.90 01/15/2019 1148   CHOLHDL 2 01/15/2019 1148   VLDL 14.2 01/15/2019 1148   LDLCALC 59 01/15/2019 1148     Physical Exam:    VS:  BP 110/62   Pulse 74   Ht 5\' 7"  (1.702 m)   Wt 112 lb (50.8 kg)   SpO2 98%   BMI 17.54 kg/m     Wt Readings from Last 3 Encounters:  06/30/20 112 lb (50.8 kg)  06/19/20 115 lb 1.3 oz (52.2 kg)  06/06/20 121 lb 14.6 oz (55.3 kg)     GEN:  Well nourished, well developed in no acute distress HEENT: Normal NECK: No JVD; No carotid bruits LYMPHATICS: No lymphadenopathy CARDIAC: Child RRR, no murmurs, rubs, gallops RESPIRATORY:  Clear to auscultation without rales, wheezing or rhonchi  ABDOMEN: Soft, non-tender, non-distended MUSCULOSKELETAL:  No edema; No deformity  SKIN: Warm and dry NEUROLOGIC:  Alert and oriented x 3 PSYCHIATRIC:  Normal affect   ASSESSMENT AND PLAN:    1. Chronic combined CHF - Recent echo with LVEF 30-35%, G1DD, abnormal global longitudinal strain, mild MR, mild AI, mild aortic dilation 67mm, dilated IVF.  -Patient appears euvolemic by exam -Continue current dose of Toprol-XL, Entresto and Lasix -Check renal function, if stable will consider spironolactone -Get stress test to rule out ischemia  2.  Hypertension -Blood pressure stable and well-controlled  3.  Nonsustained atrial  tachycardia -Noted in hospital -Denies palpitation -Continue beta-blocker  Addendum:  Shared Decision Making/Informed Consent The risks [chest pain, shortness of breath, cardiac arrhythmias, dizziness, blood pressure fluctuations, myocardial infarction, stroke/transient ischemic attack, nausea, vomiting, allergic reaction, radiation exposure, metallic taste sensation and life-threatening complications (estimated to be 1 in 10,000)], benefits (risk stratification, diagnosing coronary artery disease, treatment guidance) and alternatives of a nuclear stress test were discussed in detail with Ms. Fuchs and she agrees to proceed.   Medication Adjustments/Labs and  Tests Ordered: Current medicines are reviewed at length with the patient today.  Concerns regarding medicines are outlined above.  Orders Placed This Encounter  Procedures  . Basic metabolic panel  . MYOCARDIAL PERFUSION IMAGING   No orders of the defined types were placed in this encounter.   Patient Instructions  Medication Instructions:  Your physician recommends that you continue on your current medications as directed. Please refer to the Current Medication list given to you today.  *If you need a refill on your cardiac medications before your next appointment, please call your pharmacy*   Lab Work: BMET today. If you have labs (blood work) drawn today and your tests are completely normal, you will receive your results only by: Marland Kitchen MyChart Message (if you have MyChart) OR . A paper copy in the mail If you have any lab test that is abnormal or we need to change your treatment, we will call you to review the results.   Testing/Procedures: Your physician has requested that you have a lexiscan myoview. For further information please visit [email or web address]. Please follow instruction sheet, as given.   Follow-Up: At [facility], you and your health needs are our priority.  As part of our continuing mission to  provide you with exceptional heart care, we have created designated Provider Care Teams.  These Care Teams include your primary Cardiologist (physician) and Advanced Practice Providers (APPs -  Physician Assistants and Nurse Practitioners) who all work together to provide you with the care you need, when you need it.   Your next appointment:   3-4 month(s)  The format for your next appointment:   In Person  Provider:   Ena Dawley, MD     Signed, Leanor Kail, [place]  06/30/2020 4:31 PM    [place]

## 2020-06-30 NOTE — Patient Instructions (Addendum)
Medication Instructions:  Your physician recommends that you continue on your current medications as directed. Please refer to the Current Medication list given to you today.  *If you need a refill on your cardiac medications before your next appointment, please call your pharmacy*   Lab Work: BMET today. If you have labs (blood work) drawn today and your tests are completely normal, you will receive your results only by: Marland Kitchen MyChart Message (if you have MyChart) OR . A paper copy in the mail If you have any lab test that is abnormal or we need to change your treatment, we will call you to review the results.   Testing/Procedures: Your physician has requested that you have a lexiscan myoview. For further information please visit [email or web address]. Please follow instruction sheet, as given.   Follow-Up: At [facility], you and your health needs are our priority.  As part of our continuing mission to provide you with exceptional heart care, we have created designated Provider Care Teams.  These Care Teams include your primary Cardiologist (physician) and Advanced Practice Providers (APPs -  Physician Assistants and Nurse Practitioners) who all work together to provide you with the care you need, when you need it.   Your next appointment:   3-4 month(s)  The format for your next appointment:   In Person  Provider:   Ena Dawley, MD

## 2020-07-01 ENCOUNTER — Other Ambulatory Visit: Payer: Self-pay | Admitting: *Deleted

## 2020-07-01 LAB — BASIC METABOLIC PANEL
BUN/Creatinine Ratio: 11 — ABNORMAL LOW (ref 12–28)
BUN: 11 mg/dL (ref 8–27)
CO2: 27 mmol/L (ref 20–29)
Calcium: 8.7 mg/dL (ref 8.7–10.3)
Chloride: 94 mmol/L — ABNORMAL LOW (ref 96–106)
Creatinine, Ser: 0.99 mg/dL (ref 0.57–1.00)
GFR calc Af Amer: 62 mL/min/{1.73_m2} (ref >59)
GFR calc non Af Amer: 54 mL/min/{1.73_m2} — ABNORMAL LOW (ref >59)
Glucose: 93 mg/dL (ref 65–99)
Potassium: 3.7 mmol/L (ref 3.5–5.2)
Sodium: 137 mmol/L (ref 134–144)

## 2020-07-01 MED ORDER — SPIRONOLACTONE 25 MG PO TABS: 25.0000 mg | ORAL_TABLET | Freq: Every day | ORAL | 3 refills | Status: DC

## 2020-07-01 MED ORDER — POTASSIUM CHLORIDE 20 MEQ/15ML (10%) PO SOLN: 20.0000 meq | Freq: Every day | ORAL | 0 refills | Status: DC

## 2020-07-03 NOTE — Addendum Note (Signed)
Addended by: Gaetano Net on: 07/03/2020 07:35 AM   Modules accepted: Orders

## 2020-07-04 ENCOUNTER — Other Ambulatory Visit: Payer: Self-pay | Admitting: Family Medicine

## 2020-07-04 DIAGNOSIS — I10 Essential (primary) hypertension: Secondary | ICD-10-CM

## 2020-07-04 DIAGNOSIS — I5032 Chronic diastolic (congestive) heart failure: Secondary | ICD-10-CM

## 2020-07-05 NOTE — Addendum Note (Signed)
Addended by: Dominga Ferry on: 07/05/2020 08:59 AM   Modules accepted: Orders

## 2020-07-08 DIAGNOSIS — I10 Essential (primary) hypertension: Secondary | ICD-10-CM | POA: Diagnosis not present

## 2020-07-08 DIAGNOSIS — E039 Hypothyroidism, unspecified: Secondary | ICD-10-CM | POA: Diagnosis not present

## 2020-07-10 DIAGNOSIS — I5020 Unspecified systolic (congestive) heart failure: Secondary | ICD-10-CM | POA: Diagnosis not present

## 2020-07-10 DIAGNOSIS — E039 Hypothyroidism, unspecified: Secondary | ICD-10-CM | POA: Diagnosis not present

## 2020-07-10 DIAGNOSIS — M25511 Pain in right shoulder: Secondary | ICD-10-CM | POA: Diagnosis not present

## 2020-07-10 DIAGNOSIS — N390 Urinary tract infection, site not specified: Secondary | ICD-10-CM | POA: Diagnosis not present

## 2020-07-18 DIAGNOSIS — N390 Urinary tract infection, site not specified: Secondary | ICD-10-CM | POA: Diagnosis not present

## 2020-07-21 DIAGNOSIS — I5020 Unspecified systolic (congestive) heart failure: Secondary | ICD-10-CM | POA: Diagnosis not present

## 2020-07-21 DIAGNOSIS — E039 Hypothyroidism, unspecified: Secondary | ICD-10-CM | POA: Diagnosis not present

## 2020-07-21 DIAGNOSIS — R5381 Other malaise: Secondary | ICD-10-CM | POA: Diagnosis not present

## 2020-07-21 DIAGNOSIS — I10 Essential (primary) hypertension: Secondary | ICD-10-CM | POA: Diagnosis not present

## 2020-07-22 DIAGNOSIS — M25511 Pain in right shoulder: Secondary | ICD-10-CM | POA: Diagnosis not present

## 2020-07-23 ENCOUNTER — Telehealth (HOSPITAL_COMMUNITY): Payer: Self-pay | Admitting: *Deleted

## 2020-07-23 NOTE — Telephone Encounter (Signed)
Patient given detailed instructions per Myocardial Perfusion Study Information Sheet for the test on 07/29/19 at 7:45. Patient notified to arrive 15 minutes early and that it is imperative to arrive on time for appointment to keep from having the test rescheduled.  If you need to cancel or reschedule your appointment, please call the office within 24 hours of your appointment. . Patient verbalized understanding.Daneil Dolin

## 2020-07-27 DIAGNOSIS — S43004D Unspecified dislocation of right shoulder joint, subsequent encounter: Secondary | ICD-10-CM | POA: Diagnosis not present

## 2020-07-27 DIAGNOSIS — Z9181 History of falling: Secondary | ICD-10-CM | POA: Diagnosis not present

## 2020-07-27 DIAGNOSIS — R2681 Unsteadiness on feet: Secondary | ICD-10-CM | POA: Diagnosis not present

## 2020-07-27 DIAGNOSIS — S2241XD Multiple fractures of ribs, right side, subsequent encounter for fracture with routine healing: Secondary | ICD-10-CM | POA: Diagnosis not present

## 2020-07-27 DIAGNOSIS — R2689 Other abnormalities of gait and mobility: Secondary | ICD-10-CM | POA: Diagnosis not present

## 2020-07-27 DIAGNOSIS — M6281 Muscle weakness (generalized): Secondary | ICD-10-CM | POA: Diagnosis not present

## 2020-07-28 ENCOUNTER — Encounter (HOSPITAL_COMMUNITY): Payer: Medicare PPO

## 2020-08-06 ENCOUNTER — Encounter (HOSPITAL_COMMUNITY): Payer: Self-pay | Admitting: *Deleted

## 2020-08-06 ENCOUNTER — Telehealth (HOSPITAL_COMMUNITY): Payer: Self-pay | Admitting: *Deleted

## 2020-08-06 NOTE — Telephone Encounter (Signed)
Attempted to call patient regarding upcoming appt. No answer, unable to leave a message, mailbox full. Christy Wade

## 2020-08-08 ENCOUNTER — Encounter (HOSPITAL_COMMUNITY): Payer: Medicare PPO

## 2020-08-19 ENCOUNTER — Telehealth (HOSPITAL_COMMUNITY): Payer: Self-pay | Admitting: *Deleted

## 2020-08-19 NOTE — Telephone Encounter (Signed)
Patient given detailed instructions per Myocardial Perfusion Study Information Sheet for the test on 08/22/20. Patient notified to arrive 15 minutes early and that it is imperative to arrive on time for appointment to keep from having the test rescheduled.  If you need to cancel or reschedule your appointment, please call the office within 24 hours of your appointment. . Patient verbalized understanding. Lillyth Spong Jacqueline    

## 2020-08-21 DIAGNOSIS — R946 Abnormal results of thyroid function studies: Secondary | ICD-10-CM | POA: Diagnosis not present

## 2020-08-22 ENCOUNTER — Encounter (HOSPITAL_COMMUNITY): Payer: Medicare PPO

## 2020-08-22 DIAGNOSIS — I5020 Unspecified systolic (congestive) heart failure: Secondary | ICD-10-CM | POA: Diagnosis not present

## 2020-08-22 DIAGNOSIS — I10 Essential (primary) hypertension: Secondary | ICD-10-CM | POA: Diagnosis not present

## 2020-08-22 DIAGNOSIS — M25511 Pain in right shoulder: Secondary | ICD-10-CM | POA: Diagnosis not present

## 2020-08-22 DIAGNOSIS — E039 Hypothyroidism, unspecified: Secondary | ICD-10-CM | POA: Diagnosis not present

## 2020-08-27 DIAGNOSIS — R2689 Other abnormalities of gait and mobility: Secondary | ICD-10-CM | POA: Diagnosis not present

## 2020-08-27 DIAGNOSIS — S2241XD Multiple fractures of ribs, right side, subsequent encounter for fracture with routine healing: Secondary | ICD-10-CM | POA: Diagnosis not present

## 2020-08-27 DIAGNOSIS — S43004D Unspecified dislocation of right shoulder joint, subsequent encounter: Secondary | ICD-10-CM | POA: Diagnosis not present

## 2020-08-27 DIAGNOSIS — M6281 Muscle weakness (generalized): Secondary | ICD-10-CM | POA: Diagnosis not present

## 2020-08-27 DIAGNOSIS — R2681 Unsteadiness on feet: Secondary | ICD-10-CM | POA: Diagnosis not present

## 2020-08-27 DIAGNOSIS — Z9181 History of falling: Secondary | ICD-10-CM | POA: Diagnosis not present

## 2020-08-29 ENCOUNTER — Encounter (HOSPITAL_COMMUNITY): Payer: Self-pay | Admitting: Physician Assistant

## 2020-09-02 ENCOUNTER — Other Ambulatory Visit: Payer: Self-pay | Admitting: Family Medicine

## 2020-09-02 DIAGNOSIS — B351 Tinea unguium: Secondary | ICD-10-CM | POA: Diagnosis not present

## 2020-09-03 DIAGNOSIS — E871 Hypo-osmolality and hyponatremia: Secondary | ICD-10-CM | POA: Diagnosis not present

## 2020-09-03 DIAGNOSIS — E039 Hypothyroidism, unspecified: Secondary | ICD-10-CM | POA: Diagnosis not present

## 2020-09-03 DIAGNOSIS — I10 Essential (primary) hypertension: Secondary | ICD-10-CM | POA: Diagnosis not present

## 2020-09-03 DIAGNOSIS — I5020 Unspecified systolic (congestive) heart failure: Secondary | ICD-10-CM | POA: Diagnosis not present

## 2020-09-03 DIAGNOSIS — M25511 Pain in right shoulder: Secondary | ICD-10-CM | POA: Diagnosis not present

## 2020-09-10 DIAGNOSIS — M818 Other osteoporosis without current pathological fracture: Secondary | ICD-10-CM | POA: Diagnosis not present

## 2020-09-10 DIAGNOSIS — I5021 Acute systolic (congestive) heart failure: Secondary | ICD-10-CM | POA: Diagnosis not present

## 2020-09-11 DIAGNOSIS — I10 Essential (primary) hypertension: Secondary | ICD-10-CM | POA: Diagnosis not present

## 2020-09-11 DIAGNOSIS — I5020 Unspecified systolic (congestive) heart failure: Secondary | ICD-10-CM | POA: Diagnosis not present

## 2020-09-11 DIAGNOSIS — E039 Hypothyroidism, unspecified: Secondary | ICD-10-CM | POA: Diagnosis not present

## 2020-09-11 DIAGNOSIS — R5381 Other malaise: Secondary | ICD-10-CM | POA: Diagnosis not present

## 2020-09-17 DIAGNOSIS — Z9181 History of falling: Secondary | ICD-10-CM | POA: Diagnosis not present

## 2020-09-17 DIAGNOSIS — I5021 Acute systolic (congestive) heart failure: Secondary | ICD-10-CM | POA: Diagnosis not present

## 2020-09-17 DIAGNOSIS — E876 Hypokalemia: Secondary | ICD-10-CM | POA: Diagnosis not present

## 2020-09-17 DIAGNOSIS — F411 Generalized anxiety disorder: Secondary | ICD-10-CM | POA: Diagnosis not present

## 2020-09-17 DIAGNOSIS — I110 Hypertensive heart disease with heart failure: Secondary | ICD-10-CM | POA: Diagnosis not present

## 2020-09-17 DIAGNOSIS — I10 Essential (primary) hypertension: Secondary | ICD-10-CM | POA: Diagnosis not present

## 2020-09-17 DIAGNOSIS — E039 Hypothyroidism, unspecified: Secondary | ICD-10-CM | POA: Diagnosis not present

## 2020-09-17 DIAGNOSIS — E46 Unspecified protein-calorie malnutrition: Secondary | ICD-10-CM | POA: Diagnosis not present

## 2020-09-17 DIAGNOSIS — K219 Gastro-esophageal reflux disease without esophagitis: Secondary | ICD-10-CM | POA: Diagnosis not present

## 2020-09-18 DIAGNOSIS — E039 Hypothyroidism, unspecified: Secondary | ICD-10-CM | POA: Diagnosis not present

## 2020-09-18 DIAGNOSIS — I5021 Acute systolic (congestive) heart failure: Secondary | ICD-10-CM | POA: Diagnosis not present

## 2020-09-18 DIAGNOSIS — Z9181 History of falling: Secondary | ICD-10-CM | POA: Diagnosis not present

## 2020-09-18 DIAGNOSIS — E119 Type 2 diabetes mellitus without complications: Secondary | ICD-10-CM | POA: Diagnosis not present

## 2020-09-18 DIAGNOSIS — F411 Generalized anxiety disorder: Secondary | ICD-10-CM | POA: Diagnosis not present

## 2020-09-18 DIAGNOSIS — E46 Unspecified protein-calorie malnutrition: Secondary | ICD-10-CM | POA: Diagnosis not present

## 2020-09-18 DIAGNOSIS — I110 Hypertensive heart disease with heart failure: Secondary | ICD-10-CM | POA: Diagnosis not present

## 2020-09-19 DIAGNOSIS — I110 Hypertensive heart disease with heart failure: Secondary | ICD-10-CM | POA: Diagnosis not present

## 2020-09-19 DIAGNOSIS — E46 Unspecified protein-calorie malnutrition: Secondary | ICD-10-CM | POA: Diagnosis not present

## 2020-09-19 DIAGNOSIS — I5021 Acute systolic (congestive) heart failure: Secondary | ICD-10-CM | POA: Diagnosis not present

## 2020-09-19 DIAGNOSIS — F411 Generalized anxiety disorder: Secondary | ICD-10-CM | POA: Diagnosis not present

## 2020-09-19 DIAGNOSIS — Z9181 History of falling: Secondary | ICD-10-CM | POA: Diagnosis not present

## 2020-09-23 DIAGNOSIS — Z9181 History of falling: Secondary | ICD-10-CM | POA: Diagnosis not present

## 2020-09-23 DIAGNOSIS — I110 Hypertensive heart disease with heart failure: Secondary | ICD-10-CM | POA: Diagnosis not present

## 2020-09-23 DIAGNOSIS — E46 Unspecified protein-calorie malnutrition: Secondary | ICD-10-CM | POA: Diagnosis not present

## 2020-09-23 DIAGNOSIS — F411 Generalized anxiety disorder: Secondary | ICD-10-CM | POA: Diagnosis not present

## 2020-09-23 DIAGNOSIS — I5021 Acute systolic (congestive) heart failure: Secondary | ICD-10-CM | POA: Diagnosis not present

## 2020-09-24 DIAGNOSIS — S43004D Unspecified dislocation of right shoulder joint, subsequent encounter: Secondary | ICD-10-CM | POA: Diagnosis not present

## 2020-09-24 DIAGNOSIS — M6281 Muscle weakness (generalized): Secondary | ICD-10-CM | POA: Diagnosis not present

## 2020-09-24 DIAGNOSIS — I5021 Acute systolic (congestive) heart failure: Secondary | ICD-10-CM | POA: Diagnosis not present

## 2020-09-24 DIAGNOSIS — S2241XD Multiple fractures of ribs, right side, subsequent encounter for fracture with routine healing: Secondary | ICD-10-CM | POA: Diagnosis not present

## 2020-09-24 DIAGNOSIS — I110 Hypertensive heart disease with heart failure: Secondary | ICD-10-CM | POA: Diagnosis not present

## 2020-09-24 DIAGNOSIS — Z9181 History of falling: Secondary | ICD-10-CM | POA: Diagnosis not present

## 2020-09-24 DIAGNOSIS — R2681 Unsteadiness on feet: Secondary | ICD-10-CM | POA: Diagnosis not present

## 2020-09-24 DIAGNOSIS — F411 Generalized anxiety disorder: Secondary | ICD-10-CM | POA: Diagnosis not present

## 2020-09-24 DIAGNOSIS — E46 Unspecified protein-calorie malnutrition: Secondary | ICD-10-CM | POA: Diagnosis not present

## 2020-09-24 DIAGNOSIS — R2689 Other abnormalities of gait and mobility: Secondary | ICD-10-CM | POA: Diagnosis not present

## 2020-09-25 DIAGNOSIS — F411 Generalized anxiety disorder: Secondary | ICD-10-CM | POA: Diagnosis not present

## 2020-09-25 DIAGNOSIS — E46 Unspecified protein-calorie malnutrition: Secondary | ICD-10-CM | POA: Diagnosis not present

## 2020-09-25 DIAGNOSIS — I5021 Acute systolic (congestive) heart failure: Secondary | ICD-10-CM | POA: Diagnosis not present

## 2020-09-25 DIAGNOSIS — I110 Hypertensive heart disease with heart failure: Secondary | ICD-10-CM | POA: Diagnosis not present

## 2020-09-25 DIAGNOSIS — Z9181 History of falling: Secondary | ICD-10-CM | POA: Diagnosis not present

## 2020-09-26 DIAGNOSIS — F411 Generalized anxiety disorder: Secondary | ICD-10-CM | POA: Diagnosis not present

## 2020-09-26 DIAGNOSIS — I110 Hypertensive heart disease with heart failure: Secondary | ICD-10-CM | POA: Diagnosis not present

## 2020-09-26 DIAGNOSIS — E46 Unspecified protein-calorie malnutrition: Secondary | ICD-10-CM | POA: Diagnosis not present

## 2020-09-26 DIAGNOSIS — Z9181 History of falling: Secondary | ICD-10-CM | POA: Diagnosis not present

## 2020-09-26 DIAGNOSIS — I5021 Acute systolic (congestive) heart failure: Secondary | ICD-10-CM | POA: Diagnosis not present

## 2020-09-29 DIAGNOSIS — Z9181 History of falling: Secondary | ICD-10-CM | POA: Diagnosis not present

## 2020-09-29 DIAGNOSIS — I5021 Acute systolic (congestive) heart failure: Secondary | ICD-10-CM | POA: Diagnosis not present

## 2020-09-29 DIAGNOSIS — I110 Hypertensive heart disease with heart failure: Secondary | ICD-10-CM | POA: Diagnosis not present

## 2020-09-29 DIAGNOSIS — E46 Unspecified protein-calorie malnutrition: Secondary | ICD-10-CM | POA: Diagnosis not present

## 2020-09-29 DIAGNOSIS — F411 Generalized anxiety disorder: Secondary | ICD-10-CM | POA: Diagnosis not present

## 2020-09-30 ENCOUNTER — Encounter: Payer: Self-pay | Admitting: Cardiology

## 2020-10-01 DIAGNOSIS — E46 Unspecified protein-calorie malnutrition: Secondary | ICD-10-CM | POA: Diagnosis not present

## 2020-10-01 DIAGNOSIS — I5021 Acute systolic (congestive) heart failure: Secondary | ICD-10-CM | POA: Diagnosis not present

## 2020-10-01 DIAGNOSIS — I110 Hypertensive heart disease with heart failure: Secondary | ICD-10-CM | POA: Diagnosis not present

## 2020-10-01 DIAGNOSIS — Z9181 History of falling: Secondary | ICD-10-CM | POA: Diagnosis not present

## 2020-10-01 DIAGNOSIS — F411 Generalized anxiety disorder: Secondary | ICD-10-CM | POA: Diagnosis not present

## 2020-10-02 DIAGNOSIS — I110 Hypertensive heart disease with heart failure: Secondary | ICD-10-CM | POA: Diagnosis not present

## 2020-10-02 DIAGNOSIS — I5021 Acute systolic (congestive) heart failure: Secondary | ICD-10-CM | POA: Diagnosis not present

## 2020-10-02 DIAGNOSIS — E46 Unspecified protein-calorie malnutrition: Secondary | ICD-10-CM | POA: Diagnosis not present

## 2020-10-02 DIAGNOSIS — F411 Generalized anxiety disorder: Secondary | ICD-10-CM | POA: Diagnosis not present

## 2020-10-02 DIAGNOSIS — Z9181 History of falling: Secondary | ICD-10-CM | POA: Diagnosis not present

## 2020-10-06 DIAGNOSIS — F411 Generalized anxiety disorder: Secondary | ICD-10-CM | POA: Diagnosis not present

## 2020-10-06 DIAGNOSIS — Z9181 History of falling: Secondary | ICD-10-CM | POA: Diagnosis not present

## 2020-10-06 DIAGNOSIS — I5021 Acute systolic (congestive) heart failure: Secondary | ICD-10-CM | POA: Diagnosis not present

## 2020-10-06 DIAGNOSIS — E46 Unspecified protein-calorie malnutrition: Secondary | ICD-10-CM | POA: Diagnosis not present

## 2020-10-06 DIAGNOSIS — I110 Hypertensive heart disease with heart failure: Secondary | ICD-10-CM | POA: Diagnosis not present

## 2020-10-07 DIAGNOSIS — E46 Unspecified protein-calorie malnutrition: Secondary | ICD-10-CM | POA: Diagnosis not present

## 2020-10-07 DIAGNOSIS — I110 Hypertensive heart disease with heart failure: Secondary | ICD-10-CM | POA: Diagnosis not present

## 2020-10-07 DIAGNOSIS — Z9181 History of falling: Secondary | ICD-10-CM | POA: Diagnosis not present

## 2020-10-07 DIAGNOSIS — I5021 Acute systolic (congestive) heart failure: Secondary | ICD-10-CM | POA: Diagnosis not present

## 2020-10-07 DIAGNOSIS — F411 Generalized anxiety disorder: Secondary | ICD-10-CM | POA: Diagnosis not present

## 2020-10-08 DIAGNOSIS — M818 Other osteoporosis without current pathological fracture: Secondary | ICD-10-CM | POA: Diagnosis not present

## 2020-10-08 DIAGNOSIS — F411 Generalized anxiety disorder: Secondary | ICD-10-CM | POA: Diagnosis not present

## 2020-10-08 DIAGNOSIS — I110 Hypertensive heart disease with heart failure: Secondary | ICD-10-CM | POA: Diagnosis not present

## 2020-10-08 DIAGNOSIS — E46 Unspecified protein-calorie malnutrition: Secondary | ICD-10-CM | POA: Diagnosis not present

## 2020-10-08 DIAGNOSIS — Z9181 History of falling: Secondary | ICD-10-CM | POA: Diagnosis not present

## 2020-10-08 DIAGNOSIS — I5021 Acute systolic (congestive) heart failure: Secondary | ICD-10-CM | POA: Diagnosis not present

## 2020-10-09 DIAGNOSIS — F411 Generalized anxiety disorder: Secondary | ICD-10-CM | POA: Diagnosis not present

## 2020-10-09 DIAGNOSIS — I5021 Acute systolic (congestive) heart failure: Secondary | ICD-10-CM | POA: Diagnosis not present

## 2020-10-09 DIAGNOSIS — I110 Hypertensive heart disease with heart failure: Secondary | ICD-10-CM | POA: Diagnosis not present

## 2020-10-10 DIAGNOSIS — E46 Unspecified protein-calorie malnutrition: Secondary | ICD-10-CM | POA: Diagnosis not present

## 2020-10-10 DIAGNOSIS — Z9181 History of falling: Secondary | ICD-10-CM | POA: Diagnosis not present

## 2020-10-10 DIAGNOSIS — F411 Generalized anxiety disorder: Secondary | ICD-10-CM | POA: Diagnosis not present

## 2020-10-10 DIAGNOSIS — I110 Hypertensive heart disease with heart failure: Secondary | ICD-10-CM | POA: Diagnosis not present

## 2020-10-10 DIAGNOSIS — I5021 Acute systolic (congestive) heart failure: Secondary | ICD-10-CM | POA: Diagnosis not present

## 2020-10-13 DIAGNOSIS — Z9181 History of falling: Secondary | ICD-10-CM | POA: Diagnosis not present

## 2020-10-13 DIAGNOSIS — E46 Unspecified protein-calorie malnutrition: Secondary | ICD-10-CM | POA: Diagnosis not present

## 2020-10-13 DIAGNOSIS — I110 Hypertensive heart disease with heart failure: Secondary | ICD-10-CM | POA: Diagnosis not present

## 2020-10-13 DIAGNOSIS — I5021 Acute systolic (congestive) heart failure: Secondary | ICD-10-CM | POA: Diagnosis not present

## 2020-10-13 DIAGNOSIS — F411 Generalized anxiety disorder: Secondary | ICD-10-CM | POA: Diagnosis not present

## 2020-10-14 DIAGNOSIS — Z9181 History of falling: Secondary | ICD-10-CM | POA: Diagnosis not present

## 2020-10-14 DIAGNOSIS — E46 Unspecified protein-calorie malnutrition: Secondary | ICD-10-CM | POA: Diagnosis not present

## 2020-10-14 DIAGNOSIS — I110 Hypertensive heart disease with heart failure: Secondary | ICD-10-CM | POA: Diagnosis not present

## 2020-10-14 DIAGNOSIS — F411 Generalized anxiety disorder: Secondary | ICD-10-CM | POA: Diagnosis not present

## 2020-10-14 DIAGNOSIS — I5021 Acute systolic (congestive) heart failure: Secondary | ICD-10-CM | POA: Diagnosis not present

## 2020-10-15 ENCOUNTER — Encounter: Payer: Self-pay | Admitting: Cardiology

## 2020-10-15 DIAGNOSIS — E46 Unspecified protein-calorie malnutrition: Secondary | ICD-10-CM | POA: Diagnosis not present

## 2020-10-15 DIAGNOSIS — I110 Hypertensive heart disease with heart failure: Secondary | ICD-10-CM | POA: Diagnosis not present

## 2020-10-15 DIAGNOSIS — I5021 Acute systolic (congestive) heart failure: Secondary | ICD-10-CM | POA: Diagnosis not present

## 2020-10-15 DIAGNOSIS — F411 Generalized anxiety disorder: Secondary | ICD-10-CM | POA: Diagnosis not present

## 2020-10-15 DIAGNOSIS — Z9181 History of falling: Secondary | ICD-10-CM | POA: Diagnosis not present

## 2020-10-16 DIAGNOSIS — I110 Hypertensive heart disease with heart failure: Secondary | ICD-10-CM | POA: Diagnosis not present

## 2020-10-16 DIAGNOSIS — E46 Unspecified protein-calorie malnutrition: Secondary | ICD-10-CM | POA: Diagnosis not present

## 2020-10-16 DIAGNOSIS — F411 Generalized anxiety disorder: Secondary | ICD-10-CM | POA: Diagnosis not present

## 2020-10-16 DIAGNOSIS — Z9181 History of falling: Secondary | ICD-10-CM | POA: Diagnosis not present

## 2020-10-16 DIAGNOSIS — I5021 Acute systolic (congestive) heart failure: Secondary | ICD-10-CM | POA: Diagnosis not present

## 2020-11-11 ENCOUNTER — Ambulatory Visit: Payer: Medicare PPO | Admitting: Cardiology

## 2020-12-04 ENCOUNTER — Ambulatory Visit: Payer: Medicare PPO | Admitting: Cardiology

## 2020-12-17 ENCOUNTER — Telehealth: Payer: Self-pay | Admitting: Family Medicine

## 2020-12-17 NOTE — Telephone Encounter (Signed)
I spoke to patient's daughter, Manuela Schwartz, about changing patient's AWV to a phone call.  Manuela Schwartz said patient's in Assisted Living.  I cancelled appointment.

## 2021-01-06 ENCOUNTER — Ambulatory Visit: Payer: Medicare PPO

## 2021-01-07 ENCOUNTER — Ambulatory Visit: Payer: Medicare PPO

## 2021-04-27 ENCOUNTER — Telehealth: Payer: Self-pay

## 2021-04-27 NOTE — Telephone Encounter (Signed)
Attempted to contact patient Christy Wade to schedule a Palliative Care consult appointment. No answer and voicemail is full.

## 2021-05-01 ENCOUNTER — Telehealth: Payer: Self-pay

## 2021-05-01 NOTE — Telephone Encounter (Signed)
Attempted to contact patient's daughter Manuela Schwartz to schedule a Palliative Care consult appointment. No answer left a message to return call.

## 2021-05-14 ENCOUNTER — Telehealth: Payer: Self-pay

## 2021-05-14 NOTE — Telephone Encounter (Signed)
Spoke with patient's daughter Manuela Schwartz and she declined Palliative Care services at this time. The referral came through from when she was admitted at [facility] in 2021. Referral canceled.

## 2021-12-16 ENCOUNTER — Telehealth: Payer: Self-pay | Admitting: Physician Assistant

## 2021-12-16 ENCOUNTER — Telehealth: Payer: Self-pay | Admitting: Cardiology

## 2021-12-16 NOTE — Telephone Encounter (Signed)
   Pre-operative Risk Assessment    Patient Name: Christy Wade  DOB: Oct 17, 1935 MRN: [id number]     Request for Surgical Clearance    Procedure:  Dental Extraction - Amount of Teeth to be Pulled:  2  Date of Surgery:  Clearance 12/17/21                                 Surgeon:  Dr Amil Amen  Surgeon's Group or Practice Name:  Dr [facility] Am[facility] Phone number:  [phone] Fax number:  [phone]   Type of Clearance Requested:   - Medical    Type of Anesthesia:  Local    Additional requests/questions:   Needing to know if they can use lidocaine or septocaine   Signed, April Henson   12/16/2021, 3:14 PM

## 2021-12-16 NOTE — Telephone Encounter (Signed)
Indie with [facility] requests the clearance also be faxed to them at: [phone]

## 2021-12-16 NOTE — Telephone Encounter (Signed)
error 

## 2021-12-16 NOTE — Telephone Encounter (Signed)
   Patient Name: Christy Wade  DOB: 1935/11/08 MRN: [id number]  Primary Cardiologist: Ena Dawley, MD  Chart reviewed as part of pre-operative protocol coverage.   Simple dental extractions (i.e. 1-2 teeth) are considered low risk procedures per guidelines and generally do not require any specific cardiac clearance. It is also generally accepted that for simple extractions and dental cleanings, there is no need to interrupt blood thinner therapy.    SBE prophylaxis is not required for the patient from a cardiac standpoint.  I will route this recommendation to the requesting party via Epic fax function and remove from pre-op pool.  Please call with questions.  Mable Fill, Marissa Nestle, NP 12/16/2021, 4:39 PM

## 2022-03-01 IMAGING — CT CT ANGIO CHEST
2 of 6 series · 18 of 36 positions shown · IV contrast (omnipaque)
Comparison: Two-view chest x-ray

CLINICAL DATA: Chest pain. Shortness of breath. Hypoxia. Recent
fall with rib fractures.

EXAM:
CT ANGIOGRAPHY CHEST WITH CONTRAST
TECHNIQUE: Multidetector CT imaging of the chest was performed using the
standard protocol during bolus administration of intravenous
contrast. Multiplanar CT image reconstructions and MIPs were
obtained to evaluate the vascular anatomy.
CONTRAST:  100mL OMNIPAQUE IOHEXOL 350 MG/ML SOLN

[Series 5: thins · axial · 0.73mm/px · z∈[-243,+1]mm · 17 of 276 slices shown]
[im 16/276  lung]
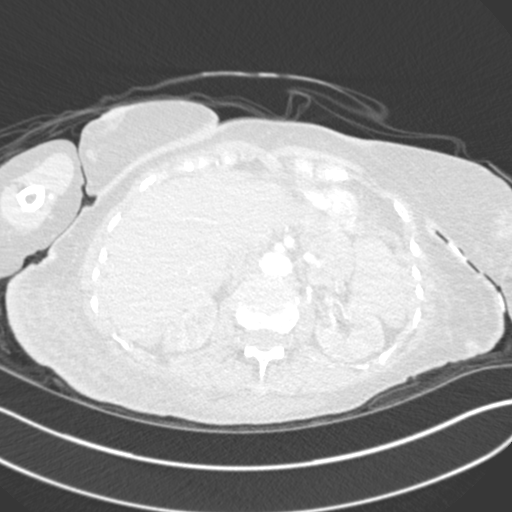
[im 31/276  mediastinal]
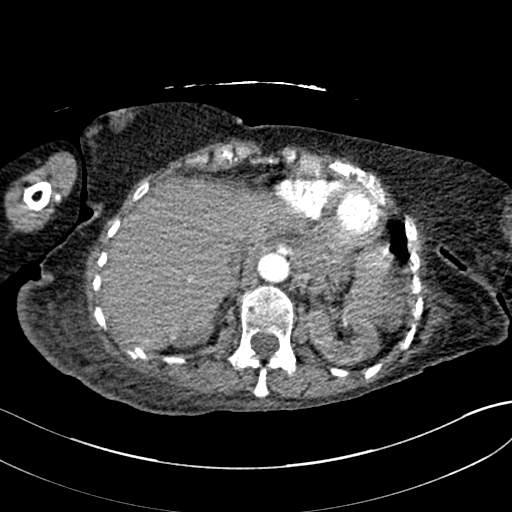
[im 46/276  lung]
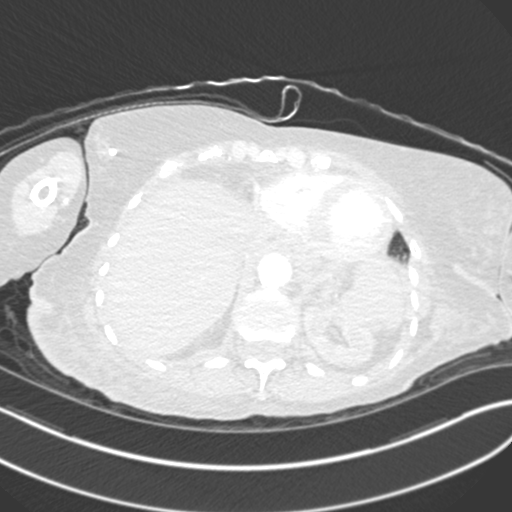
[im 62/276  mediastinal]
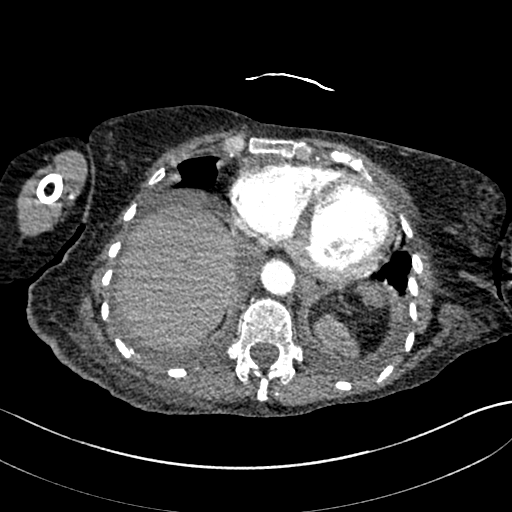
[im 77/276  lung]
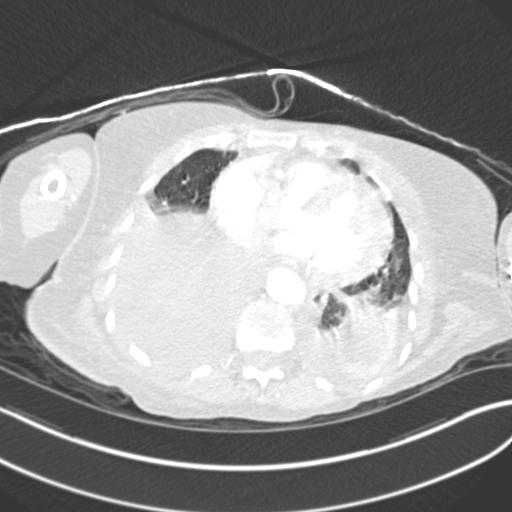
[im 92/276  mediastinal]
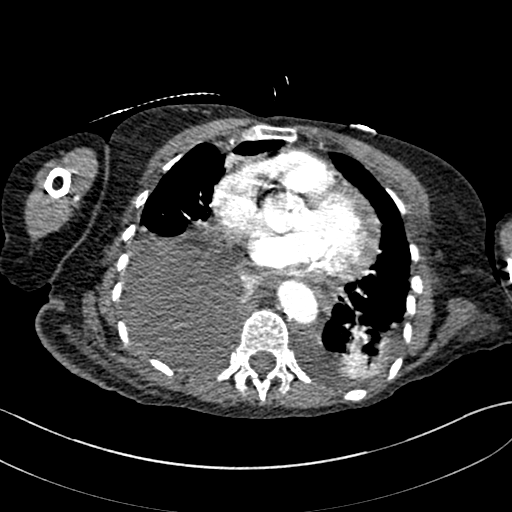
[im 107/276  lung]
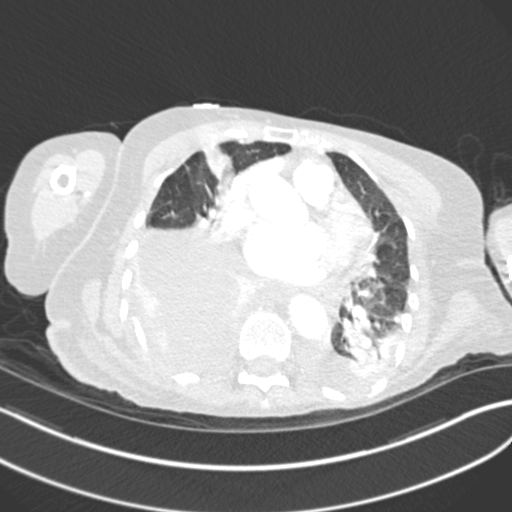
[im 123/276  mediastinal]
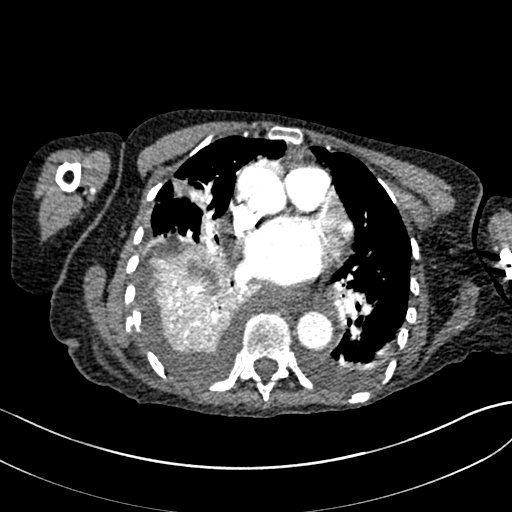
[im 138/276  lung]
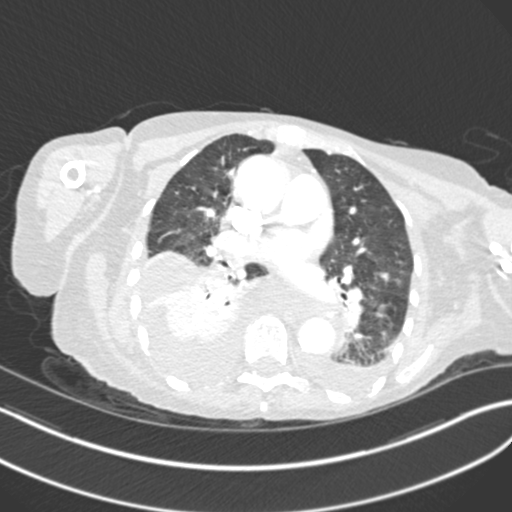
[im 153/276  mediastinal]
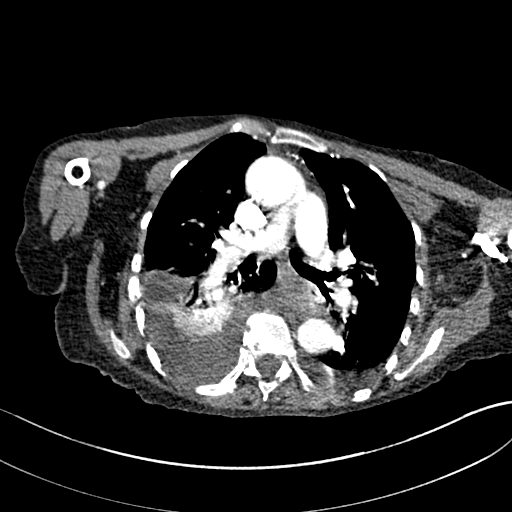
[im 169/276  lung]
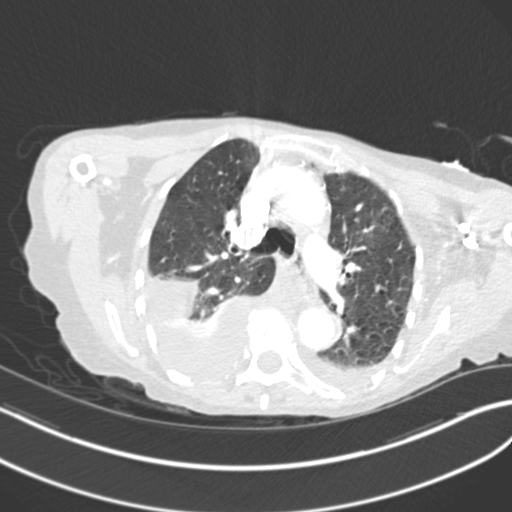
[im 184/276  mediastinal]
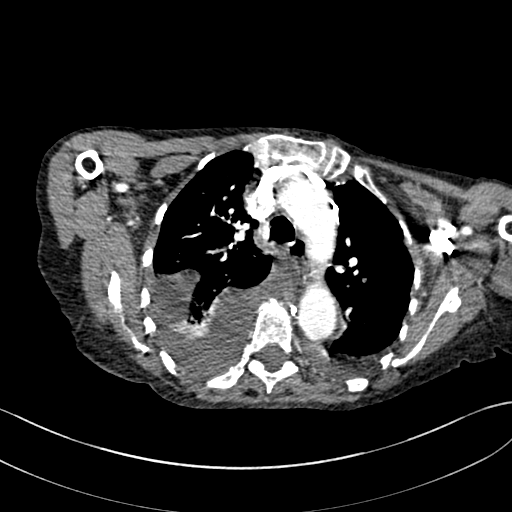
[im 199/276  lung]
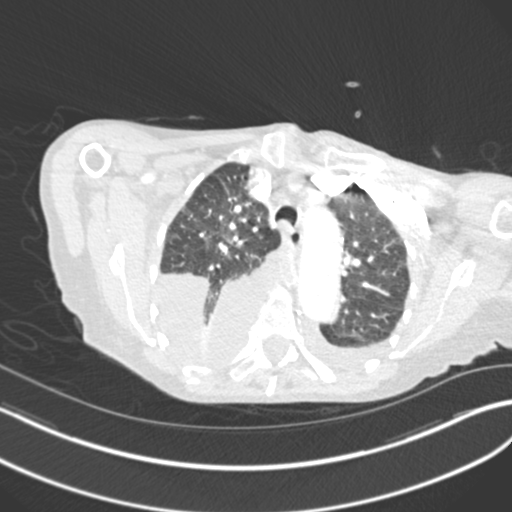
[im 214/276  mediastinal]
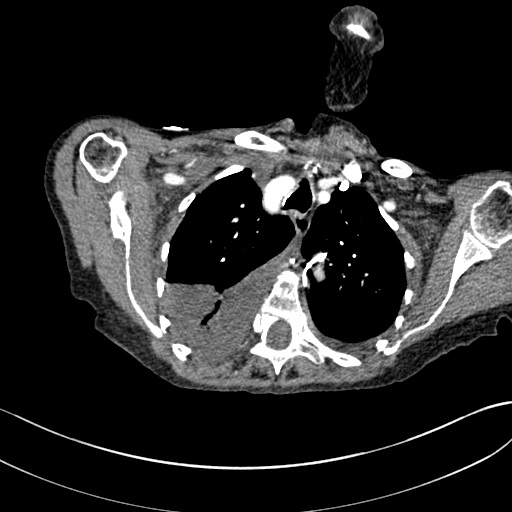
[im 230/276  lung]
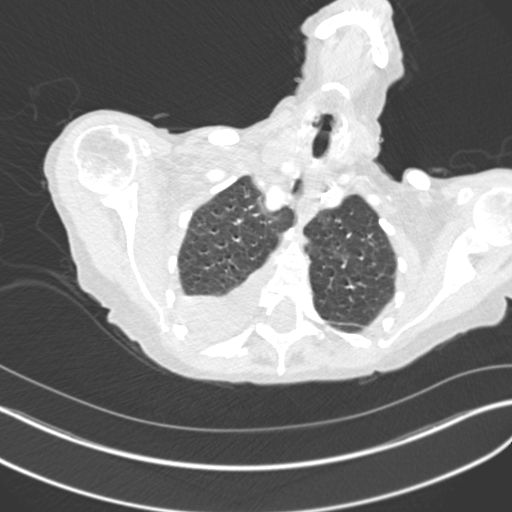
[im 245/276  mediastinal]
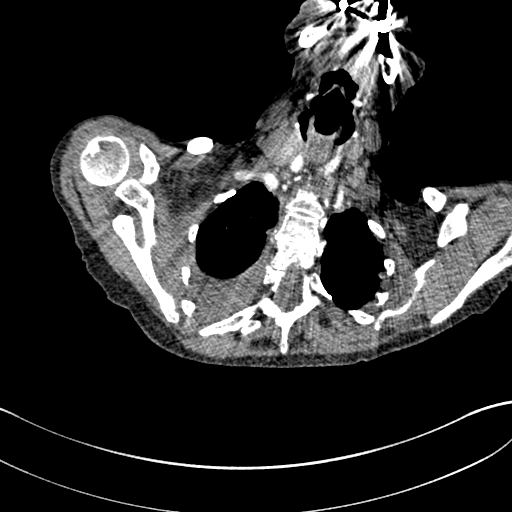
[im 260/276  lung]
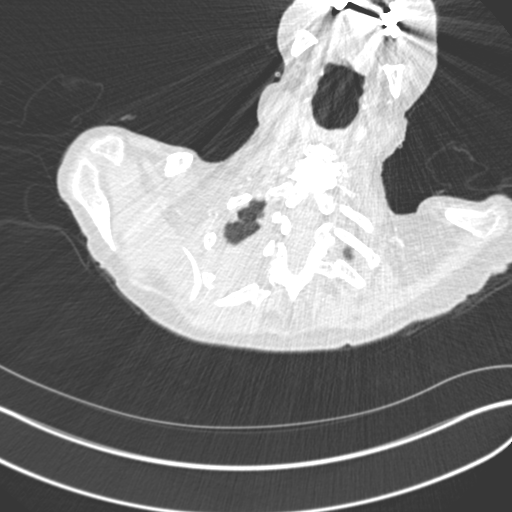

[Series 7: coronal mpr · coronal · 0.59mm/px · 1 of 123 slices shown]
[im 62/123  mediastinal]
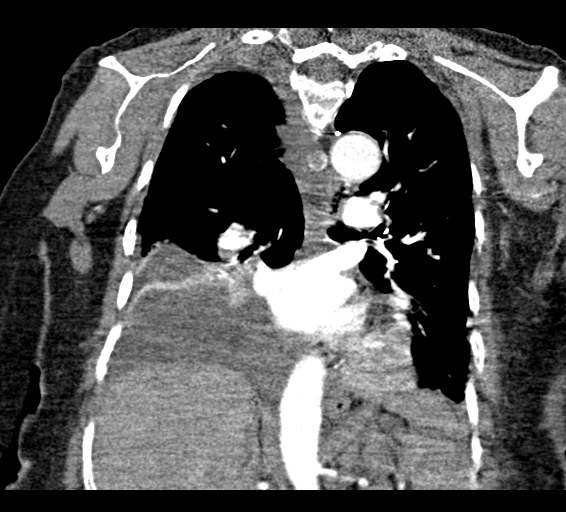

[18 of 36 positions shown; findings below may reference images not displayed]

FINDINGS: Cardiovascular: The heart size is normal. Aortic arch and great
vessel origins are within normal limits.

Pulmonary artery opacification is satisfactory. Pulmonary artery
size is normal. No focal filling defects are present to suggest
pulmonary embolus.

Mediastinum/Nodes: No significant mediastinal, hilar, or axillary
adenopathy is present.

Lungs/Pleura: Moderate size right pleural effusion is present. Right
lower lobe atelectasis is associated. Smaller left pleural effusion
is present. More mild volume loss is present on the left. Fluid is
present along the minor fissure with mild associated atelectasis.
Minimal ground-glass attenuation is present otherwise.

Upper Abdomen: . water density 2 cm cyst is present at the posterior
right kidney. Visualized upper abdomen is otherwise unremarkable.

Musculoskeletal: Right posterior 6-11 rib fractures are noted. No
pneumothorax is present. Exaggerated thoracic kyphosis is present.
Vertebral body heights are maintained. No focal lytic or blastic
lesions are present.

Review of the MIP images confirms the above findings.
IMPRESSION: 1. No pulmonary embolus.
2. Moderate size right pleural effusion and small left pleural
effusion.
3. Right lower lobe volume loss/atelectasis.
4. Minimal volume loss at the left base.
5. Minimal scattered ground-glass attenuation likely represents
edema or atelectasis. Infection is considered less likely.
6. Right posterior 6-11 rib fractures without pneumothorax.
7. Exaggerated thoracic kyphosis.

## 2022-03-03 IMAGING — DX DG CHEST 1V PORT
1 series · 1 of 1 positions shown · non-contrast
Comparison: 03/25/2020

CLINICAL DATA: Pleural effusion

EXAM:
PORTABLE CHEST 1 VIEW

[chest ap]
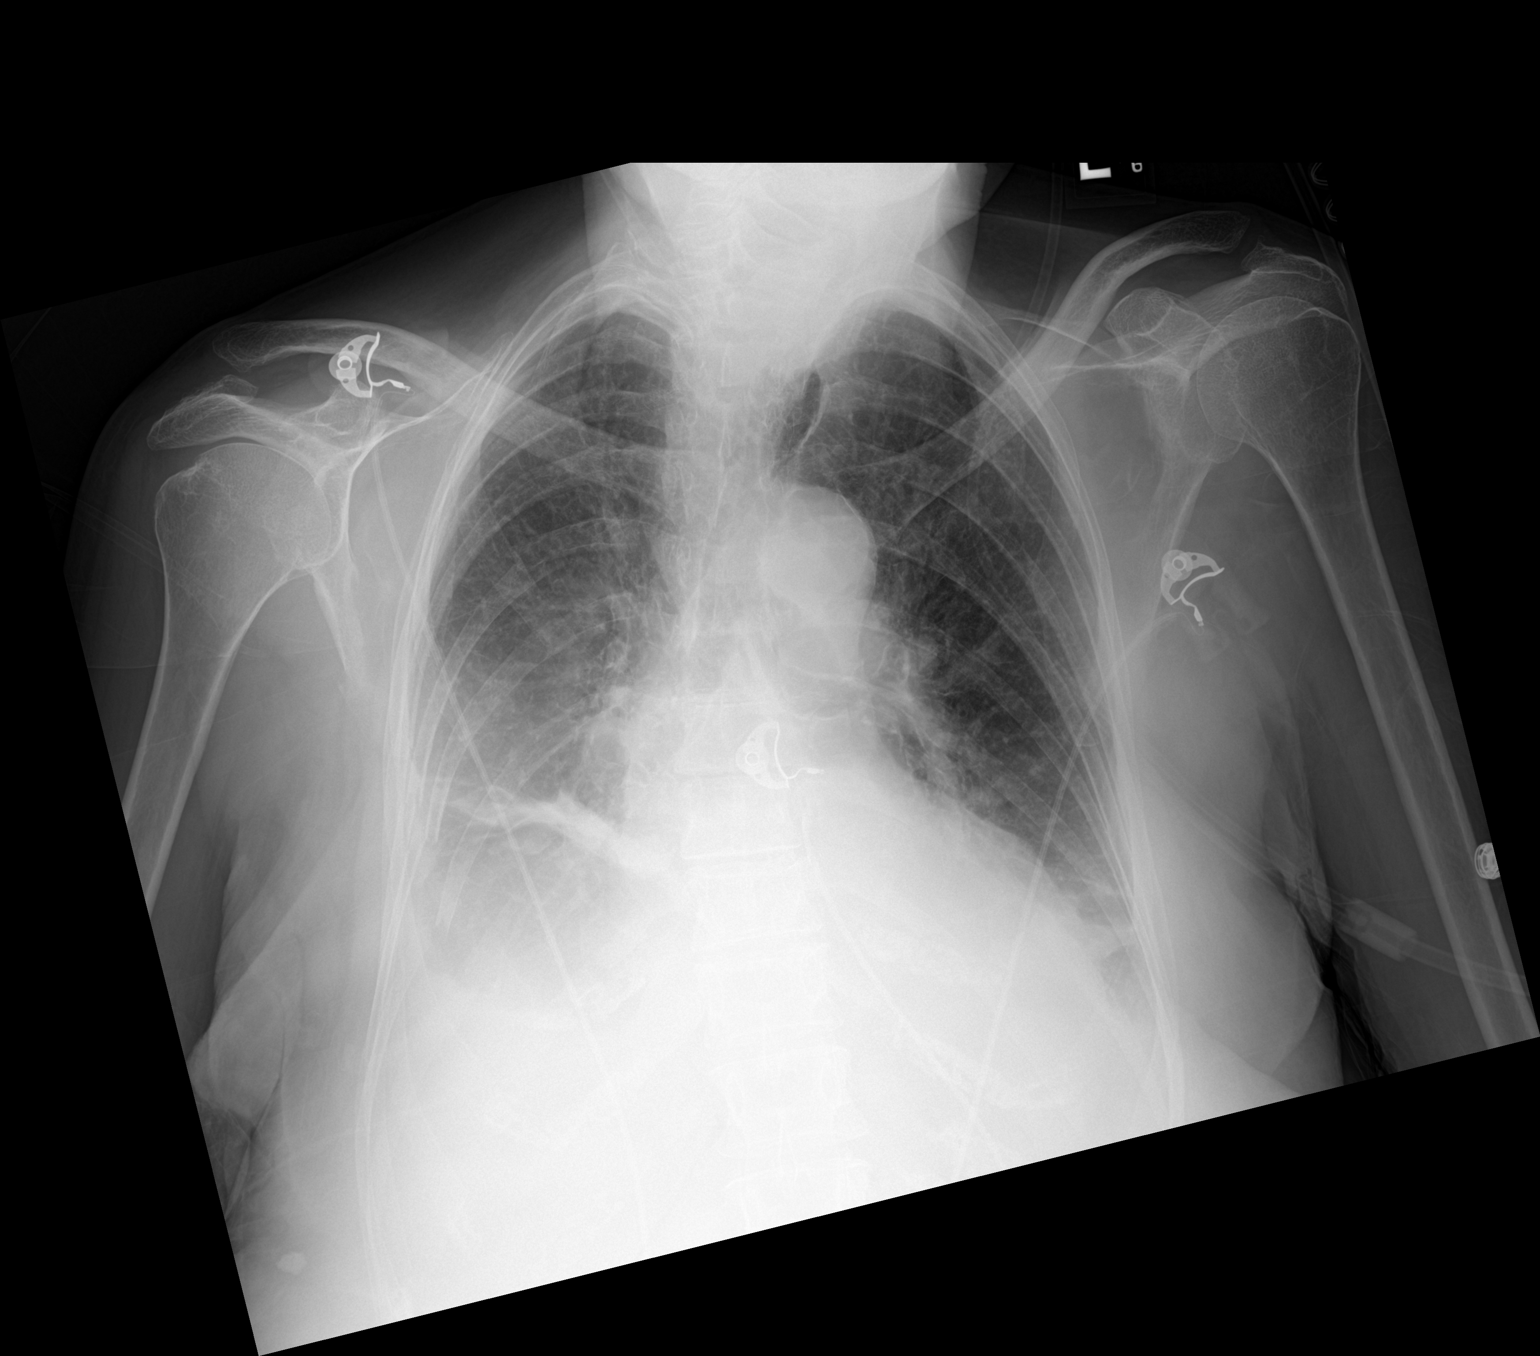

[1 of 1 positions shown; findings below may reference images not displayed]

FINDINGS: Shallow inspiration. Cardiac enlargement. Bilateral pleural
effusions greater on the right. Bilateral basilar atelectasis or
infiltration, also greater on the right. Similar appearance to
previous study.
IMPRESSION: Bilateral pleural effusions and basilar atelectasis or infiltration,
greater on the right.

## 2022-03-08 IMAGING — DX DG CHEST 2V
2 series · 2 of 2 positions shown · non-contrast
Comparison: March 27, 2020.

CLINICAL DATA: Shortness of breath.

EXAM:
CHEST - 2 VIEW

[w chest lat]
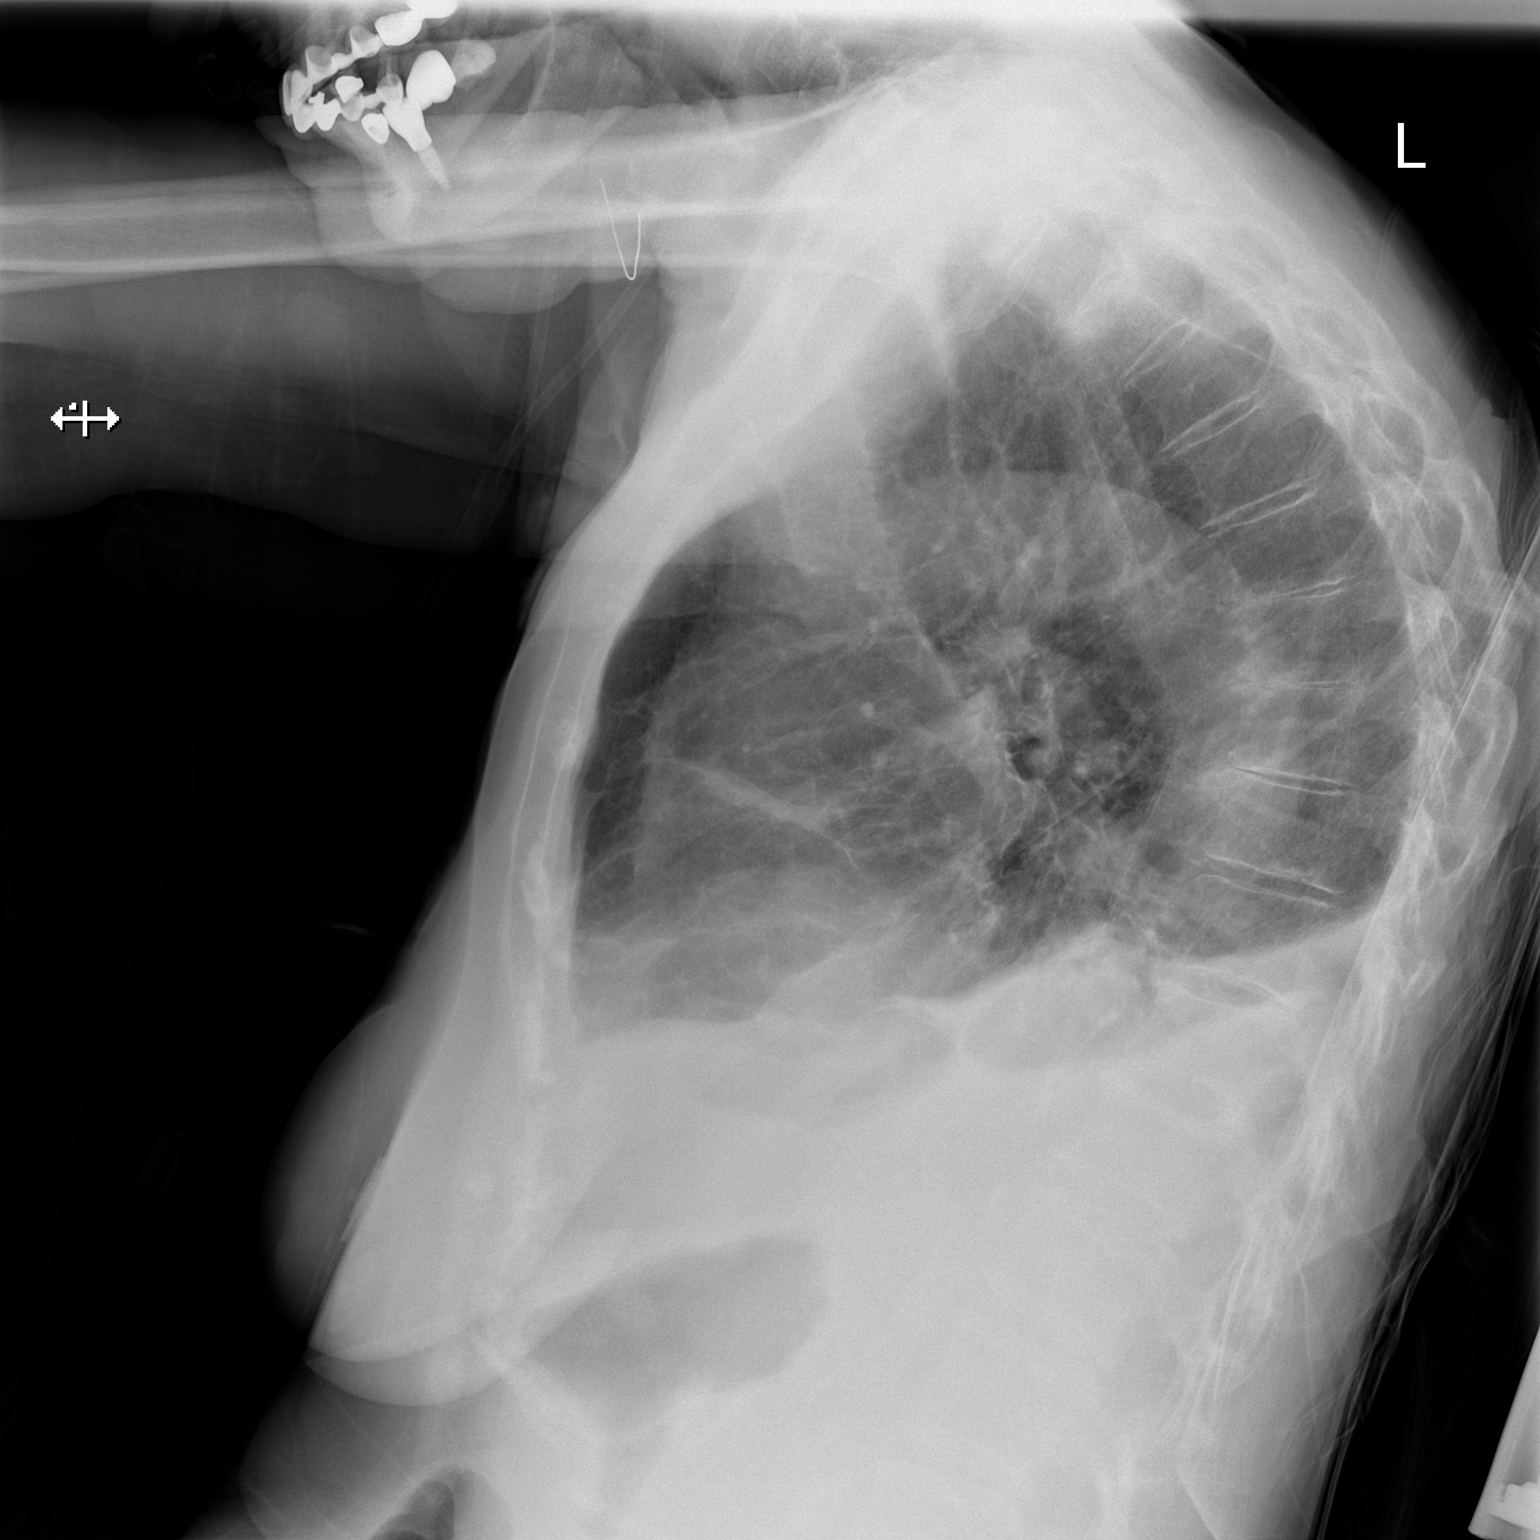

[x chest ap]
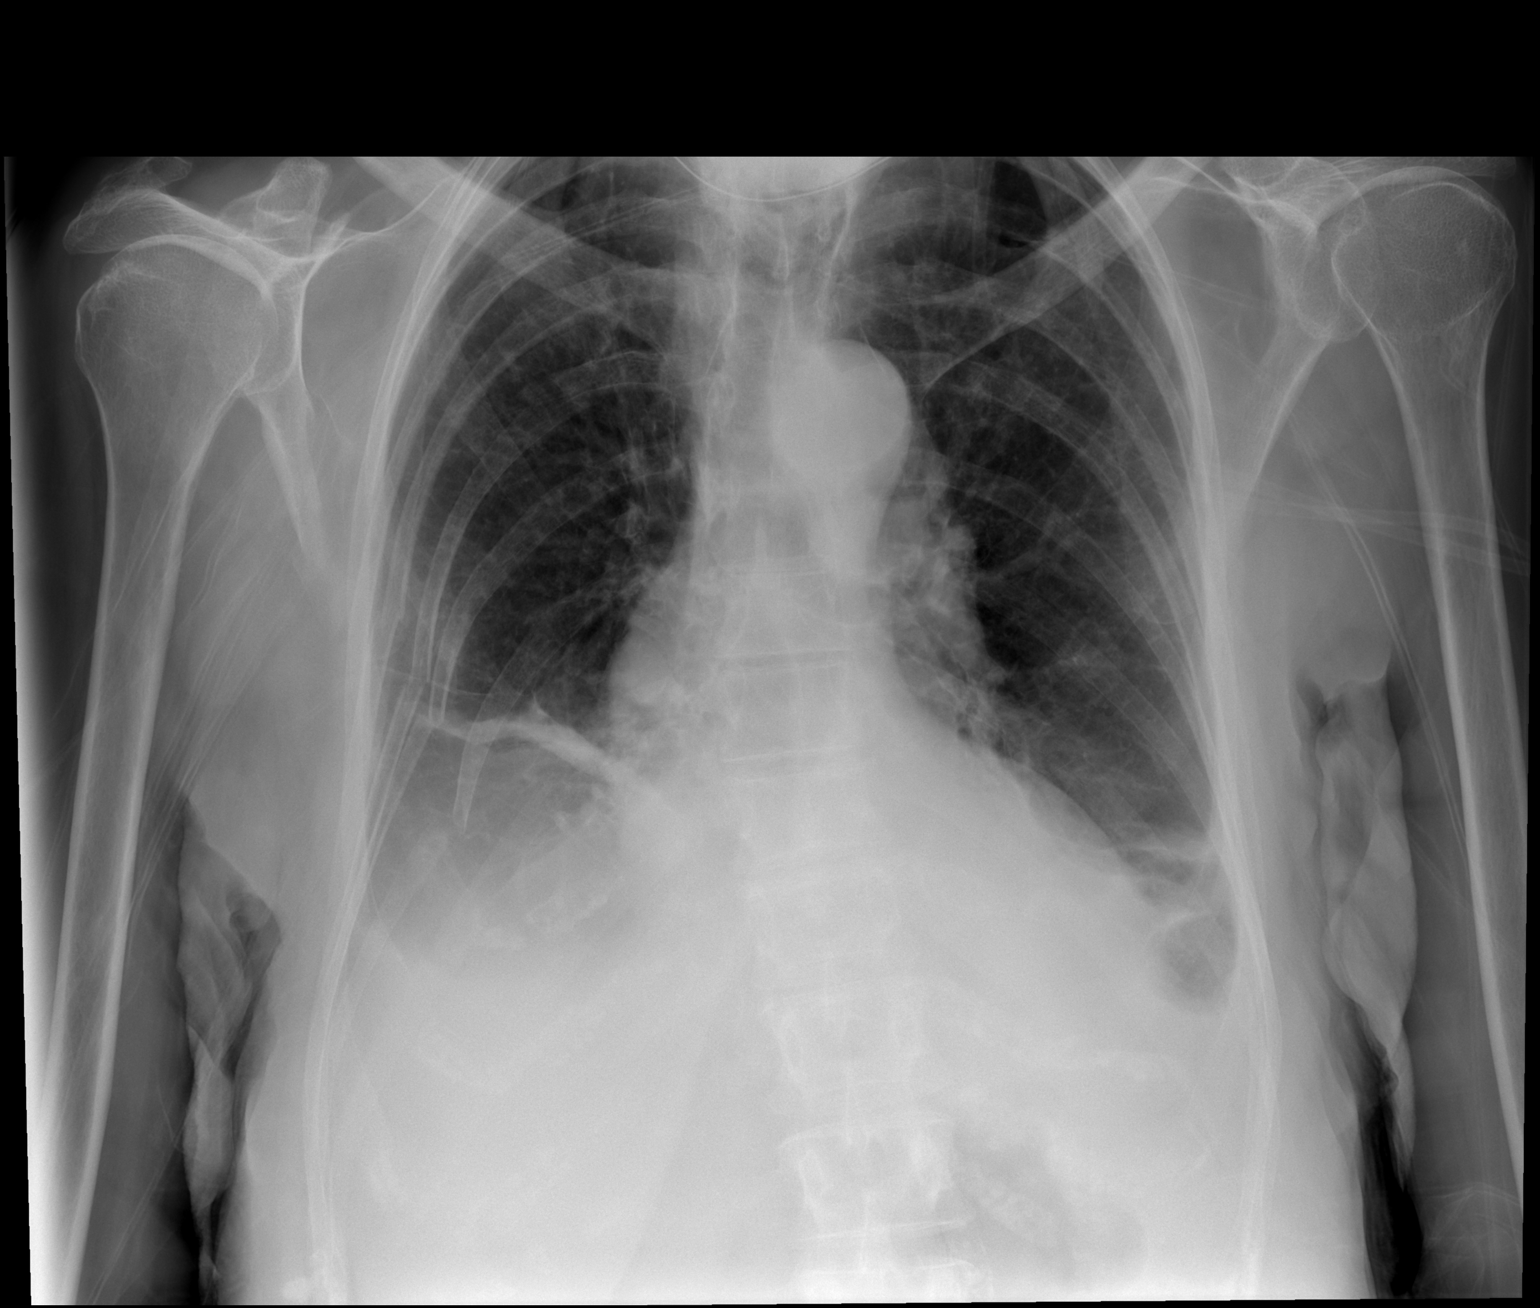

[2 of 2 positions shown; findings below may reference images not displayed]

FINDINGS: Stable cardiomediastinal silhouette. No pneumothorax is noted.
Bibasilar subsegmental atelectasis is noted with small pleural
effusions. Stable right rib fractures are noted.
IMPRESSION: Bibasilar subsegmental atelectasis with small pleural effusions.

## 2022-03-10 ENCOUNTER — Encounter: Payer: Self-pay | Admitting: General Practice

## 2022-05-08 ENCOUNTER — Emergency Department (HOSPITAL_COMMUNITY): Payer: Medicare PPO

## 2022-05-08 ENCOUNTER — Inpatient Hospital Stay (HOSPITAL_COMMUNITY)
Admission: EM | Admit: 2022-05-08 | Discharge: 2022-05-13 | DRG: 641 | Disposition: A | Discharge status: Home Health | Payer: Medicare PPO | Source: Skilled Nursing Facility | Attending: Family Medicine | Admitting: Family Medicine

## 2022-05-08 ENCOUNTER — Encounter (HOSPITAL_COMMUNITY): Payer: Self-pay

## 2022-05-08 ENCOUNTER — Other Ambulatory Visit: Payer: Self-pay

## 2022-05-08 DIAGNOSIS — Z1152 Encounter for screening for COVID-19: Secondary | ICD-10-CM

## 2022-05-08 DIAGNOSIS — K219 Gastro-esophageal reflux disease without esophagitis: Secondary | ICD-10-CM | POA: Diagnosis present

## 2022-05-08 DIAGNOSIS — R072 Precordial pain: Secondary | ICD-10-CM | POA: Diagnosis present

## 2022-05-08 DIAGNOSIS — B9620 Unspecified Escherichia coli [E. coli] as the cause of diseases classified elsewhere: Secondary | ICD-10-CM | POA: Diagnosis present

## 2022-05-08 DIAGNOSIS — I5042 Chronic combined systolic (congestive) and diastolic (congestive) heart failure: Secondary | ICD-10-CM | POA: Diagnosis present

## 2022-05-08 DIAGNOSIS — N390 Urinary tract infection, site not specified: Secondary | ICD-10-CM | POA: Diagnosis present

## 2022-05-08 DIAGNOSIS — M810 Age-related osteoporosis without current pathological fracture: Secondary | ICD-10-CM | POA: Diagnosis present

## 2022-05-08 DIAGNOSIS — D509 Iron deficiency anemia, unspecified: Secondary | ICD-10-CM | POA: Diagnosis present

## 2022-05-08 DIAGNOSIS — R0609 Other forms of dyspnea: Secondary | ICD-10-CM | POA: Diagnosis present

## 2022-05-08 DIAGNOSIS — Z882 Allergy status to sulfonamides status: Secondary | ICD-10-CM

## 2022-05-08 DIAGNOSIS — Z885 Allergy status to narcotic agent status: Secondary | ICD-10-CM

## 2022-05-08 DIAGNOSIS — E871 Hypo-osmolality and hyponatremia: Principal | ICD-10-CM | POA: Diagnosis present

## 2022-05-08 DIAGNOSIS — Z66 Do not resuscitate: Secondary | ICD-10-CM | POA: Diagnosis present

## 2022-05-08 DIAGNOSIS — Z803 Family history of malignant neoplasm of breast: Secondary | ICD-10-CM

## 2022-05-08 DIAGNOSIS — D72819 Decreased white blood cell count, unspecified: Secondary | ICD-10-CM | POA: Diagnosis present

## 2022-05-08 DIAGNOSIS — Z833 Family history of diabetes mellitus: Secondary | ICD-10-CM

## 2022-05-08 DIAGNOSIS — Z90711 Acquired absence of uterus with remaining cervical stump: Secondary | ICD-10-CM

## 2022-05-08 DIAGNOSIS — R079 Chest pain, unspecified: Secondary | ICD-10-CM

## 2022-05-08 DIAGNOSIS — Z87440 Personal history of urinary (tract) infections: Secondary | ICD-10-CM

## 2022-05-08 DIAGNOSIS — Z79890 Hormone replacement therapy (postmenopausal): Secondary | ICD-10-CM

## 2022-05-08 DIAGNOSIS — Z823 Family history of stroke: Secondary | ICD-10-CM

## 2022-05-08 DIAGNOSIS — I110 Hypertensive heart disease with heart failure: Secondary | ICD-10-CM | POA: Diagnosis present

## 2022-05-08 DIAGNOSIS — W19XXXD Unspecified fall, subsequent encounter: Secondary | ICD-10-CM | POA: Diagnosis present

## 2022-05-08 DIAGNOSIS — Z79899 Other long term (current) drug therapy: Secondary | ICD-10-CM

## 2022-05-08 DIAGNOSIS — S42301D Unspecified fracture of shaft of humerus, right arm, subsequent encounter for fracture with routine healing: Secondary | ICD-10-CM

## 2022-05-08 DIAGNOSIS — R1013 Epigastric pain: Secondary | ICD-10-CM

## 2022-05-08 DIAGNOSIS — I447 Left bundle-branch block, unspecified: Secondary | ICD-10-CM | POA: Diagnosis present

## 2022-05-08 DIAGNOSIS — R296 Repeated falls: Secondary | ICD-10-CM | POA: Diagnosis present

## 2022-05-08 DIAGNOSIS — E039 Hypothyroidism, unspecified: Secondary | ICD-10-CM | POA: Diagnosis present

## 2022-05-08 DIAGNOSIS — N179 Acute kidney failure, unspecified: Secondary | ICD-10-CM | POA: Diagnosis present

## 2022-05-08 DIAGNOSIS — R0789 Other chest pain: Secondary | ICD-10-CM

## 2022-05-08 DIAGNOSIS — R000 Tachycardia, unspecified: Secondary | ICD-10-CM | POA: Diagnosis present

## 2022-05-08 LAB — CBC WITH DIFFERENTIAL/PLATELET
Abs Immature Granulocytes: 0.02 10*3/uL (ref 0.00–0.07)
Basophils Absolute: 0 10*3/uL (ref 0.0–0.1)
Basophils Relative: 0 %
Eosinophils Absolute: 0 10*3/uL (ref 0.0–0.5)
Eosinophils Relative: 0 %
HCT: 33.3 % — ABNORMAL LOW (ref 36.0–46.0)
Hemoglobin: 11.2 g/dL — ABNORMAL LOW (ref 12.0–15.0)
Immature Granulocytes: 1 %
Lymphocytes Relative: 26 %
Lymphs Abs: 0.9 10*3/uL (ref 0.7–4.0)
MCH: 30.1 pg (ref 26.0–34.0)
MCHC: 33.6 g/dL (ref 30.0–36.0)
MCV: 89.5 fL (ref 80.0–100.0)
Monocytes Absolute: 0.4 10*3/uL (ref 0.1–1.0)
Monocytes Relative: 11 %
Neutro Abs: 2.1 10*3/uL (ref 1.7–7.7)
Neutrophils Relative %: 62 %
Platelets: 193 10*3/uL (ref 150–400)
RBC: 3.72 MIL/uL — ABNORMAL LOW (ref 3.87–5.11)
RDW: 15.3 % (ref 11.5–15.5)
WBC: 3.4 10*3/uL — ABNORMAL LOW (ref 4.0–10.5)
nRBC: 0 % (ref 0.0–0.2)

## 2022-05-08 LAB — COMPREHENSIVE METABOLIC PANEL
ALT: 14 U/L (ref 0–44)
AST: 27 U/L (ref 15–41)
Albumin: 3.5 g/dL (ref 3.5–5.0)
Alkaline Phosphatase: 88 U/L (ref 38–126)
Anion gap: 8 (ref 5–15)
BUN: 10 mg/dL (ref 8–23)
CO2: 26 mmol/L (ref 22–32)
Calcium: 8.3 mg/dL — ABNORMAL LOW (ref 8.9–10.3)
Chloride: 87 mmol/L — ABNORMAL LOW (ref 98–111)
Creatinine, Ser: 1.25 mg/dL — ABNORMAL HIGH (ref 0.44–1.00)
GFR, Estimated: 43 mL/min — ABNORMAL LOW (ref >60)
Glucose, Bld: 90 mg/dL (ref 70–99)
Potassium: 3.8 mmol/L (ref 3.5–5.1)
Sodium: 121 mmol/L — ABNORMAL LOW (ref 135–145)
Total Bilirubin: 1.3 mg/dL — ABNORMAL HIGH (ref 0.3–1.2)
Total Protein: 6.6 g/dL (ref 6.5–8.1)

## 2022-05-08 LAB — RESP PANEL BY RT-PCR (FLU A&B, COVID) ARPGX2
Influenza A by PCR: NEGATIVE
Influenza B by PCR: NEGATIVE
SARS Coronavirus 2 by RT PCR: NEGATIVE

## 2022-05-08 LAB — LIPASE, BLOOD
Lipase: 45 U/L (ref 11–51)
Lipase: 54 U/L — ABNORMAL HIGH (ref 11–51)

## 2022-05-08 LAB — TROPONIN I (HIGH SENSITIVITY)
Troponin I (High Sensitivity): 8 ng/L (ref <18)
Troponin I (High Sensitivity): 10 ng/L (ref <18)

## 2022-05-08 LAB — BRAIN NATRIURETIC PEPTIDE: B Natriuretic Peptide: 91.9 pg/mL (ref 0.0–100.0)

## 2022-05-08 MED ORDER — TRAMADOL 5 MG/ML ORAL SUSPENSION: 25.0000 mg | Freq: Four times a day (QID) | ORAL | Status: DC | PRN

## 2022-05-08 MED ORDER — TRAMADOL HCL 50 MG PO TABS
25.0000 mg | ORAL_TABLET | Freq: Four times a day (QID) | ORAL | Status: DC | PRN
  Administered 2022-05-08 – 2022-05-09 (×2): 25 mg via ORAL
  Filled 2022-05-08 (×2): qty 1

## 2022-05-08 MED ORDER — MIRTAZAPINE 15 MG PO TABS
7.5000 mg | ORAL_TABLET | Freq: Every day | ORAL | Status: DC
  Administered 2022-05-08 – 2022-05-09 (×2): 7.5 mg via ORAL
  Filled 2022-05-08 (×2): qty 1

## 2022-05-08 MED ORDER — ALPRAZOLAM 0.25 MG PO TABS: 0.2500 mg | ORAL_TABLET | Freq: Every evening | ORAL | Status: DC | PRN

## 2022-05-08 MED ORDER — SODIUM CHLORIDE 0.9 % IV SOLN: INTRAVENOUS | Status: DC

## 2022-05-08 MED ORDER — HYDRALAZINE HCL 25 MG PO TABS
25.0000 mg | ORAL_TABLET | Freq: Three times a day (TID) | ORAL | Status: DC
  Administered 2022-05-08 – 2022-05-10 (×4): 25 mg via ORAL
  Filled 2022-05-08 (×3): qty 1

## 2022-05-08 MED ORDER — CYCLOSPORINE 0.05 % OP EMUL
1.0000 [drp] | Freq: Two times a day (BID) | OPHTHALMIC | Status: DC
  Administered 2022-05-08 – 2022-05-13 (×10): 1 [drp] via OPHTHALMIC
  Filled 2022-05-08 (×10): qty 30

## 2022-05-08 MED ORDER — IOHEXOL 350 MG/ML SOLN
100.0000 mL | Freq: Once | INTRAVENOUS | Status: AC | PRN
  Administered 2022-05-08: 100 mL via INTRAVENOUS

## 2022-05-08 MED ORDER — LACTATED RINGERS IV SOLN: INTRAVENOUS | Status: DC

## 2022-05-08 MED ORDER — LEVOTHYROXINE SODIUM 88 MCG PO TABS
88.0000 ug | ORAL_TABLET | Freq: Every day | ORAL | Status: DC
  Administered 2022-05-09 – 2022-05-10 (×2): 88 ug via ORAL
  Filled 2022-05-08 (×2): qty 1

## 2022-05-08 MED ORDER — SUCRALFATE 1 G PO TABS
1.0000 g | ORAL_TABLET | Freq: Once | ORAL | Status: AC
  Administered 2022-05-08: 1 g via ORAL
  Filled 2022-05-08: qty 1

## 2022-05-08 MED ORDER — PROSOURCE PLUS PO LIQD
30.0000 mL | Freq: Every day | ORAL | Status: DC
  Administered 2022-05-09 – 2022-05-13 (×5): 30 mL via ORAL
  Filled 2022-05-08 (×5): qty 30

## 2022-05-08 MED ORDER — METOPROLOL SUCCINATE ER 50 MG PO TB24
50.0000 mg | ORAL_TABLET | Freq: Every day | ORAL | Status: DC
  Administered 2022-05-09: 50 mg via ORAL
  Filled 2022-05-08: qty 1

## 2022-05-08 MED ORDER — ALUM & MAG HYDROXIDE-SIMETH 200-200-20 MG/5ML PO SUSP
30.0000 mL | Freq: Once | ORAL | Status: AC
  Administered 2022-05-08: 30 mL via ORAL
  Filled 2022-05-08: qty 30

## 2022-05-08 MED ORDER — PANTOPRAZOLE SODIUM 40 MG PO TBEC
40.0000 mg | DELAYED_RELEASE_TABLET | Freq: Two times a day (BID) | ORAL | Status: DC
  Administered 2022-05-08 – 2022-05-09 (×3): 40 mg via ORAL
  Filled 2022-05-08 (×3): qty 1

## 2022-05-08 MED ORDER — HEPARIN SODIUM (PORCINE) 5000 UNIT/ML IJ SOLN
5000.0000 [IU] | Freq: Three times a day (TID) | INTRAMUSCULAR | Status: DC
  Administered 2022-05-08 – 2022-05-13 (×14): 5000 [IU] via SUBCUTANEOUS
  Filled 2022-05-08 (×13): qty 1

## 2022-05-08 MED ORDER — FAMOTIDINE 20 MG PO TABS
40.0000 mg | ORAL_TABLET | Freq: Once | ORAL | Status: AC
  Administered 2022-05-08: 40 mg via ORAL
  Filled 2022-05-08: qty 2

## 2022-05-08 NOTE — H&P (Addendum)
History and Physical    Patient: Christy Wade WUJ:[id number] DOB: Jul 18, 1936 DOA: 05/08/2022 DOS: the patient was seen and examined on 05/08/2022 PCP: Luetta Nutting, DO  Patient coming from: Home  Chief Complaint:  Chief Complaint  Patient presents with   Chest Pain   Nausea   HPI: [name] is a 86 y.o. female with medical history significant for but not limited to hypertension, hypothyroidism, history of a cataract, history of cardiac disease and history of chronic combined systolic and diastolic CHF as well as a recent fall with a spiral humeral fracture, GERD as well as other comorbidities including a history of hyponatremia who presents with epigastric and mid chest discomfort as well as dyspnea on exertion with "frequent burping".  Patient states that about a week ago she started having some reflux symptoms which progressively worsened throughout the week.  Also noticed that she is getting more more short of breath over the last week or so especially with ambulation and exertion.  States that she has had some diarrhea which is chronic for her and is "nervous".  Had a fall about a month ago and broke her right humerus and was post to see the orthopedic surgeon Dr. Doy Mince on Monday.  Patient states that she "spit up" once and continues to abdomen burning epigastric and midsternal chest pain.  States this is happened to her before in the past and it feels like it was before when the sodium pills that she was taken for prior hyponatremia caused esophageal irritation.  She denies any food getting stuck in either leg pain or swelling.  She did have some chest discomfort and shortness of breath but denies any orthopnea.  States that the cast in her right arm is causing her pain and digging into her skin.  Given her symptoms she presented to the ED for further evaluation and [facility] hospitalist was asked to admit this patient for epigastric and chest discomfort with reflux symptoms as well as  dyspnea on exertion.  Further work-up revealed that she was hyponatremic with a sodium of 121.  Review of Systems: As mentioned in the history of present illness. All other systems reviewed and are negative. Past Medical History:  Diagnosis Date   Anemia    on iron now   Cataract    Hypertension    Hypothyroidism    Osteoporosis    PONV (postoperative nausea and vomiting)    Thyroid disease    Varicose vein    Past Surgical History:  Procedure Laterality Date   ABDOMINAL HYSTERECTOMY  1975   partial   APPENDECTOMY  1975   with hysterectomy   HEMORROIDECTOMY     [phone]   LAPAROSCOPIC SMALL BOWEL RESECTION N/A 11/29/2014   Procedure: LAPAROSCOPIC SMALL INTESTINE RESECTION;  Surgeon: Michael Boston, MD;  Location: [facility];  Service: General;  Laterality: N/A;   VARICOSE VEIN SURGERY Left 1971   Social History:  reports that she has never smoked. She has never used smokeless tobacco. She reports that she does not drink alcohol and does not use drugs.  Allergies  Allergen Reactions   Codeine Nausea Only    REACTION: nausea   Sulfamethoxazole Rash    REACTION: rash    Family History  Problem Relation Age of Onset   Bone cancer Mother    Diabetes Father    Stroke Father    Breast cancer Sister    Stomach cancer Neg Hx    Colon cancer Neg Hx  Prior to Admission medications   Medication Sig Start Date End Date Taking? Authorizing Provider  ALPRAZolam (XANAX) 0.25 MG tablet Take 1 tablet (0.25 mg total) by mouth at bedtime as needed for anxiety. 06/17/20   Caren Griffins, MD  ampicillin (PRINCIPEN) 500 MG capsule Take 500 mg by mouth 4 (four) times daily. Start 06/24/20 finish 07/01/20    [provider]  CRANBERRY-CALCIUM PO Take 1 tablet by mouth daily.    [provider]  cycloSPORINE (RESTASIS) 0.05 % ophthalmic emulsion Place 1 drop into both eyes 2 (two) times daily. 04/24/20   Ngetich, Dinah C, NP  diphenoxylate-atropine (LOMOTIL) 2.5-0.025  MG tablet Take one tablet 1-2x per day as needed for diarrhea. Patient taking differently: Take 1 tablet by mouth daily as needed for diarrhea or loose stools. 06/02/20   Luetta Nutting, DO  furosemide (LASIX) 40 MG tablet TAKE 1 TABLET(40 MG) BY MOUTH DAILY 07/04/20   Luetta Nutting, DO  hydrALAZINE (APRESOLINE) 25 MG tablet TAKE 1 TABLET(25 MG) BY MOUTH EVERY 8 HOURS 07/04/20   Luetta Nutting, DO  hydroxypropyl methylcellulose / hypromellose (ISOPTO TEARS / GONIOVISC) 2.5 % ophthalmic solution 1 drop.    [provider]  levothyroxine (SYNTHROID) 88 MCG tablet Take 1 tablet (88 mcg total) by mouth daily before breakfast. 05/14/20   Luetta Nutting, DO  Menthol, Topical Analgesic, (BIOFREEZE) 4 % GEL Apply 3 oz topically 3 (three) times daily as needed. 04/24/20   Ngetich, Dinah C, NP  metoprolol succinate (TOPROL-XL) 50 MG 24 hr tablet Take 1 tablet (50 mg total) by mouth daily. Take with or immediately following a meal. 06/18/20   Gherghe, Vella Redhead, MD  mirtazapine (REMERON) 15 MG tablet Take 0.5 tablets (7.5 mg total) by mouth at bedtime. Patient taking differently: Take 15 mg by mouth at bedtime. 06/02/20   Luetta Nutting, DO  Nutritional Supplements (,FEEDING SUPPLEMENT, PROSOURCE PLUS) liquid Take 30 mLs by mouth daily. 06/19/20   Elie Confer, MD  pantoprazole (PROTONIX) 40 MG tablet Take 1 tablet (40 mg total) by mouth daily. 05/12/20   Luetta Nutting, DO  potassium chloride 20 MEQ/15ML (10%) SOLN Take 15 mLs (20 mEq total) by mouth daily. 07/01/20 05/08/22  Bhagat, Crista Luria, PA  sacubitril-valsartan (ENTRESTO) 24-26 MG Take 1 tablet by mouth 2 (two) times daily. Please check BP prior to administration and hold if MAP is less than 65 06/19/20   Ajuonuma, Waneta Martins, MD  spironolactone (ALDACTONE) 25 MG tablet Take 1 tablet (25 mg total) by mouth daily. 07/01/20 05/08/22  Leanor Kail, PA    Physical Exam: Vitals:   05/08/22 1400 05/08/22 1430 05/08/22 1700 05/08/22 1702   BP: (!) 126/110 (!) 158/94 (!) 170/75 (!) 159/78  Pulse: 84 81 68 69  Resp: 19 (!) '24 16 15  '$ Temp:    97.8 F (36.6 C)  TempSrc:    Oral  SpO2: 100% 100% 100% 99%  Weight:      Height:       Examination: Physical Exam:  Constitutional: Chronically ill-appearing elderly Caucasian female who is anxious Respiratory: Diminished to auscultation bilaterally, no wheezing, rales, rhonchi or crackles. Normal respiratory effort and patient is not tachypenic. No accessory muscle use.  Unlabored breathing Cardiovascular: RRR but she has some PACs noted, no murmurs / rubs / gallops. S1 and S2 auscultated. No extremity edema.  Abdomen: Soft, non-tender, distended secondary body habitus. Bowel sounds positive.  GU: Deferred. Musculoskeletal: Has a right shoulder immobilizer with a hard shell Skin: No rashes,  lesions, ulcers limited skin evaluation but does have some bruising and has a bruise where the hard shell was poking into her armpit. No induration; Warm and dry.  Neurologic: CN 2-12 grossly intact with no focal deficits. Romberg sign and cerebellar reflexes not assessed.  Psychiatric: Normal judgment and insight. Alert and oriented x 3.  Anxious mood and appropriate affect.   Data Reviewed: Recent Results (from the past 2160 hour(s))  CBC with Differential/Platelet     Status: Abnormal   Collection Time: 05/08/22  2:02 PM  Result Value Ref Range   WBC 3.4 (L) 4.0 - 10.5 K/uL   RBC 3.72 (L) 3.87 - 5.11 MIL/uL   Hemoglobin 11.2 (L) 12.0 - 15.0 g/dL   HCT 33.3 (L) 36.0 - 46.0 %   MCV 89.5 80.0 - 100.0 fL   MCH 30.1 26.0 - 34.0 pg   MCHC 33.6 30.0 - 36.0 g/dL   RDW 15.3 11.5 - 15.5 %   Platelets 193 150 - 400 K/uL   nRBC 0.0 0.0 - 0.2 %   Neutrophils Relative % 62 %   Neutro Abs 2.1 1.7 - 7.7 K/uL   Lymphocytes Relative 26 %   Lymphs Abs 0.9 0.7 - 4.0 K/uL   Monocytes Relative 11 %   Monocytes Absolute 0.4 0.1 - 1.0 K/uL   Eosinophils Relative 0 %   Eosinophils Absolute 0.0 0.0 -  0.5 K/uL   Basophils Relative 0 %   Basophils Absolute 0.0 0.0 - 0.1 K/uL   Immature Granulocytes 1 %   Abs Immature Granulocytes 0.02 0.00 - 0.07 K/uL    Comment: Performed at [facility], [place] [address] [place], [place] [address]  Comprehensive metabolic panel     Status: Abnormal   Collection Time: 05/08/22  2:02 PM  Result Value Ref Range   Sodium 121 (L) 135 - 145 mmol/L   Potassium 3.8 3.5 - 5.1 mmol/L   Chloride 87 (L) 98 - 111 mmol/L   CO2 26 22 - 32 mmol/L   Glucose, Bld 90 70 - 99 mg/dL    Comment: Glucose reference range applies only to samples taken after fasting for at least 8 hours.   BUN 10 8 - 23 mg/dL   Creatinine, Ser 1.25 (H) 0.44 - 1.00 mg/dL   Calcium 8.3 (L) 8.9 - 10.3 mg/dL   Total Protein 6.6 6.5 - 8.1 g/dL   Albumin 3.5 3.5 - 5.0 g/dL   AST 27 15 - 41 U/L   ALT 14 0 - 44 U/L   Alkaline Phosphatase 88 38 - 126 U/L   Total Bilirubin 1.3 (H) 0.3 - 1.2 mg/dL   GFR, Estimated 43 (L) >60 mL/min    Comment: (NOTE) Calculated using the CKD-EPI Creatinine Equation (2021)    Anion gap 8 5 - 15    Comment: Performed at [facility], [place] [address] [place], [place] [address]  Lipase, blood     Status: None   Collection Time: 05/08/22  2:02 PM  Result Value Ref Range   Lipase 45 11 - 51 U/L    Comment: Performed at [facility], [place] [address] [place], [place] [address]  Troponin I (High Sensitivity)     Status: None   Collection Time: 05/08/22  2:02 PM  Result Value Ref Range   Troponin I (High Sensitivity) 8 <18 ng/L    Comment: (NOTE) Elevated high sensitivity troponin I (hsTnI) values and significant  changes across serial measurements may  suggest ACS but many other  chronic and acute conditions are known to elevate hsTnI results.  Refer to the "Links" section for chest pain algorithms and additional  guidance. Performed at [facility], [place] [address] [place], [place]  [address]   Brain natriuretic peptide     Status: None   Collection Time: 05/08/22  2:02 PM  Result Value Ref Range   B Natriuretic Peptide 91.9 0.0 - 100.0 pg/mL    Comment: Performed at [facility], [place] [address], [place], [place] [address]  Troponin I (High Sensitivity)     Status: None   Collection Time: 05/08/22  3:44 PM  Result Value Ref Range   Troponin I (High Sensitivity) 10 <18 ng/L    Comment: (NOTE) Elevated high sensitivity troponin I (hsTnI) values and significant  changes across serial measurements may suggest ACS but many other  chronic and acute conditions are known to elevate hsTnI results.  Refer to the "Links" section for chest pain algorithms and additional  guidance. Performed at [facility], [place] [address], [place], [place] [address]   Resp Panel by RT-PCR (Flu A&B, Covid) Anterior Nasal Swab     Status: None   Collection Time: 05/08/22  5:58 PM   Specimen: Anterior Nasal Swab  Result Value Ref Range   SARS Coronavirus 2 by RT PCR NEGATIVE NEGATIVE    Comment: (NOTE) SARS-CoV-2 target nucleic acids are NOT DETECTED.  The SARS-CoV-2 RNA is generally detectable in upper respiratory specimens during the acute phase of infection. The lowest concentration of SARS-CoV-2 viral copies this assay can detect is 138 copies/mL. A negative result does not preclude SARS-Cov-2 infection and should not be used as the sole basis for treatment or other patient management decisions. A negative result may occur with  improper specimen collection/handling, submission of specimen other than nasopharyngeal swab, presence of viral mutation(s) within the areas targeted by this assay, and inadequate number of viral copies(<138 copies/mL). A negative result must be combined with clinical observations, patient history, and epidemiological information. The expected result is Negative.  Fact Sheet for Patients:   EntrepreneurPulse.com.au  Fact Sheet for Healthcare Providers:  IncredibleEmployment.be  This test is no t yet approved or cleared by the [place] FDA and  has been authorized for detection and/or diagnosis of SARS-CoV-2 by FDA under an Emergency Use Authorization (EUA). This EUA will remain  in effect (meaning this test can be used) for the duration of the COVID-19 declaration under Section 564(b)(1) of the Act, 21 U.S.C.section 360bbb-3(b)(1), unless the authorization is terminated  or revoked sooner.       Influenza A by PCR NEGATIVE NEGATIVE   Influenza B by PCR NEGATIVE NEGATIVE    Comment: (NOTE) The Xpert Xpress SARS-CoV-2/FLU/RSV plus assay is intended as an aid in the diagnosis of influenza from Nasopharyngeal swab specimens and should not be used as a sole basis for treatment. Nasal washings and aspirates are unacceptable for Xpert Xpress SARS-CoV-2/FLU/RSV testing.  Fact Sheet for Patients: EntrepreneurPulse.com.au  Fact Sheet for Healthcare Providers: IncredibleEmployment.be  This test is not yet approved or cleared by the [place] FDA and has been authorized for detection and/or diagnosis of SARS-CoV-2 by FDA under an Emergency Use Authorization (EUA). This EUA will remain in effect (meaning this test can be used) for the duration of the COVID-19 declaration under Section 564(b)(1) of the Act, 21 U.S.C. section 360bbb-3(b)(1), unless the authorization is terminated or revoked.  Performed at [facility], [place] [address], [place], [place] [address]    EKG: Showed sinus rhythm with PACs and a left bundle branch block and a QTc of 520  Assessment and Plan: No notes have been filed under this hospital service. Service: Hospitalist  Chest pain rule out ACS -Patient is having chest discomfort and dyspnea and she states that this is a little bit different than  what she remembers from her prior cardiac event where she ended up having chronic combined systolic and diastolic CHF -Checking BNP  -Troponins are flat negative and initial troponin was 8 and repeat troponin was 10 -EKG showed sinus rhythm but did show a left bundle branch block -Ordered echocardiogram for further evaluation recommendations -We will continue monitor telemetry -CTA of the chest done and showed no definite evidence of pulm embolus but she did have some mild right posterior lobe scarring or subsegmental atelectasis that was noted due to an old adjacent displaced rib fracture -May need to consult cardiology for further evaluation  Epigastric discomfort with reflux symptoms -Patient states for the last week she has been having worsening reflux symptoms and states that she has "spit up" -Increase PPI from 40 mg daily to twice daily -In the ED she received famotidine p.o., sucralfate 1 g p.o. once as well as Maalox/Mylanta -CT scan of the abdomen pelvis done and showed no acute abnormality with regards to the stomach and small bowel.  No obstruction noted. -Check Lipase Level -CT scan did mention that she had sinus mild circumferential wall thickening of the urinary bladder so we will obtain a urinalysis -Patient is concerned about swallowing solid food so we will obtain SLP evaluation and may need GI evaluation for further evaluation recommendations  Acute Hyponatremia -Possibly in the setting of her diuretics and poor p.o. intake due to reflux -Sodium was 121 on admission -Holding her furosemide Entresto and Lasix given that she appears clinically dry and has an AKI  AKI -Mild.  Baseline creatinine of 0.8 and she presents with a BUN/creatinine of 10/1.25 -Avoiding nephrotoxic medication and holding medications as above -She has been getting gentle IV fluid hydration and will continue normal saline at 25 MLS per hour -Check urinalysis and urine sodium, urine creatinine, urine  osmolality -Avoid further nephrotoxic medications, contrast dyes, hypotension and dehydration to ensure adequate renal perfusion -Repeat CMP in a.m.  Dyspnea on exertion -Check COVID and influenza A and influenza B via PCR -CTA of the chest showed no evidence of PE but did show mild right posterior lobe scarring or subsegmental atelectasis -Check BNP and echocardiogram given her history of heart failure but she appears clinically dry currently -Start flutter valve and incentive spirometry -Repeat chest x-ray in a.m.  Right humeral spiral fracture -Has a hard cast on which is causing discomfort -We will need to have [employer] adjust -Supposed to see Dr. Doy Mince in orthopedic surgery on Monday -May need to discuss with orthopedic surgery while she is here -Continue with pain control with p.o. tramadol 25 mg  Leukopenia -Mild and WBC 3.4 -Continue to monitor trend and repeat CBC in a.m and continue to monitor for signs and symptoms of infection  Normocytic anemia -Patient's hemoglobin/hematocrit is now 11.2/33.3 -Check anemia panel in the a.m. -Continue to monitor for signs of bleeding; no overt bleeding noted -Repeat CBC in a.m.    Advance Care Planning:   Code Status: Prior DO NOT RESUSCITATE  Consults: None  Family Communication: Discussed with daughter at bedside  Severity of Illness: The appropriate  patient status for this patient is OBSERVATION. Observation status is judged to be reasonable and necessary in order to provide the required intensity of service to ensure the patient's safety. The patient's presenting symptoms, physical exam findings, and initial radiographic and laboratory data in the context of their medical condition is felt to place them at decreased risk for further clinical deterioration. Furthermore, it is anticipated that the patient will be medically stable for discharge from the hospital within 2 midnights of admission.   Author: Kerney Elbe,  DO 05/08/2022 7:00 PM  For on call review [email or web address].

## 2022-05-08 NOTE — ED Triage Notes (Addendum)
Patient is from [place] on [address]. Patient c/o chest pain and nausea.  Patient has old injuries from previous falls.  Patient was given zofran 4 mg IV and Aspirin 324 mg prior to arrival to the ED.   Patient added in triage that her "throat won't let me swallow and c/o indigestion.

## 2022-05-08 NOTE — ED Provider Notes (Signed)
[facility] DEPT Provider Note   CSN: [id number] Arrival date & time: 05/08/22  1209     History  Chief Complaint  Patient presents with   Chest Pain   Nausea    Christy Wade is a 86 y.o. female.  86 year old female presents with increased dyspnea as well as epigastric abdominal discomfort.  States it feels like a burning sensation epigastric area.  Is been associate with nausea no vomiting.  No fever or chills.  Denies leg pain or swelling.  Denies any sensation of food being stuck in her throat.  States that when she swallows liquids that she does feel better.  Denies any anxiety.  Does note some dyspnea on exertion but denies any orthopnea.  No prior history of similar treatment use prior to arrival       Home Medications Prior to Admission medications   Medication Sig Start Date End Date Taking? Authorizing Provider  ALPRAZolam (XANAX) 0.25 MG tablet Take 1 tablet (0.25 mg total) by mouth at bedtime as needed for anxiety. 06/17/20   Caren Griffins, MD  ampicillin (PRINCIPEN) 500 MG capsule Take 500 mg by mouth 4 (four) times daily. Start 06/24/20 finish 07/01/20    [provider]  CRANBERRY-CALCIUM PO Take 1 tablet by mouth daily.    [provider]  cycloSPORINE (RESTASIS) 0.05 % ophthalmic emulsion Place 1 drop into both eyes 2 (two) times daily. 04/24/20   Ngetich, Dinah C, NP  diphenoxylate-atropine (LOMOTIL) 2.5-0.025 MG tablet Take one tablet 1-2x per day as needed for diarrhea. Patient taking differently: Take 1 tablet by mouth daily as needed for diarrhea or loose stools. 06/02/20   Luetta Nutting, DO  furosemide (LASIX) 40 MG tablet TAKE 1 TABLET(40 MG) BY MOUTH DAILY 07/04/20   Luetta Nutting, DO  hydrALAZINE (APRESOLINE) 25 MG tablet TAKE 1 TABLET(25 MG) BY MOUTH EVERY 8 HOURS 07/04/20   Luetta Nutting, DO  hydroxypropyl methylcellulose / hypromellose (ISOPTO TEARS / GONIOVISC) 2.5 % ophthalmic solution 1 drop.     [provider]  levothyroxine (SYNTHROID) 88 MCG tablet Take 1 tablet (88 mcg total) by mouth daily before breakfast. 05/14/20   Luetta Nutting, DO  Menthol, Topical Analgesic, (BIOFREEZE) 4 % GEL Apply 3 oz topically 3 (three) times daily as needed. 04/24/20   Ngetich, Dinah C, NP  metoprolol succinate (TOPROL-XL) 50 MG 24 hr tablet Take 1 tablet (50 mg total) by mouth daily. Take with or immediately following a meal. 06/18/20   Gherghe, Vella Redhead, MD  mirtazapine (REMERON) 15 MG tablet Take 0.5 tablets (7.5 mg total) by mouth at bedtime. Patient taking differently: Take 15 mg by mouth at bedtime. 06/02/20   Luetta Nutting, DO  Nutritional Supplements (,FEEDING SUPPLEMENT, PROSOURCE PLUS) liquid Take 30 mLs by mouth daily. 06/19/20   Elie Confer, MD  pantoprazole (PROTONIX) 40 MG tablet Take 1 tablet (40 mg total) by mouth daily. 05/12/20   Luetta Nutting, DO  potassium chloride 20 MEQ/15ML (10%) SOLN Take 15 mLs (20 mEq total) by mouth daily. 07/01/20 09/29/20  Bhagat, Crista Luria, PA  sacubitril-valsartan (ENTRESTO) 24-26 MG Take 1 tablet by mouth 2 (two) times daily. Please check BP prior to administration and hold if MAP is less than 65 06/19/20   Ajuonuma, Waneta Martins, MD  spironolactone (ALDACTONE) 25 MG tablet Take 1 tablet (25 mg total) by mouth daily. 07/01/20 09/29/20  Leanor Kail, PA      Allergies    Codeine and Sulfamethoxazole  Review of Systems   Review of Systems  All other systems reviewed and are negative.   Physical Exam Updated Vital Signs BP (!) 164/95 (BP Location: Left Arm)   Pulse 100   Temp 97.7 F (36.5 C) (Oral)   Resp 18   Ht 1.753 m ('5\' 9"'$ )   Wt 68 kg   SpO2 93%   BMI 22.15 kg/m  Physical Exam Vitals and nursing note reviewed.  Constitutional:      General: She is not in acute distress.    Appearance: Normal appearance. She is well-developed. She is not toxic-appearing.  HENT:     Head: Normocephalic and atraumatic.  Eyes:      General: Lids are normal.     Conjunctiva/sclera: Conjunctivae normal.     Pupils: Pupils are equal, round, and reactive to light.  Neck:     Thyroid: No thyroid mass.     Trachea: No tracheal deviation.  Cardiovascular:     Rate and Rhythm: Normal rate and regular rhythm.     Heart sounds: Normal heart sounds. No murmur heard.    No gallop.  Pulmonary:     Effort: Pulmonary effort is normal. No respiratory distress.     Breath sounds: Normal breath sounds. No stridor. No decreased breath sounds, wheezing, rhonchi or rales.  Abdominal:     General: There is no distension.     Palpations: Abdomen is soft.     Tenderness: There is no abdominal tenderness. There is no rebound.  Musculoskeletal:        General: No tenderness. Normal range of motion.     Cervical back: Normal range of motion and neck supple.  Skin:    General: Skin is warm and dry.     Findings: No abrasion or rash.  Neurological:     Mental Status: She is alert and oriented to person, place, and time. Mental status is at baseline.     GCS: GCS eye subscore is 4. GCS verbal subscore is 5. GCS motor subscore is 6.     Cranial Nerves: No cranial nerve deficit.     Sensory: No sensory deficit.     Motor: Motor function is intact.  Psychiatric:        Attention and Perception: Attention normal.        Speech: Speech normal.        Behavior: Behavior normal.     ED Results / Procedures / Treatments   Labs (all labs ordered are listed, but only abnormal results are displayed) Labs Reviewed  CBC WITH DIFFERENTIAL/PLATELET  COMPREHENSIVE METABOLIC PANEL  LIPASE, BLOOD    EKG None  Radiology No results found.  Procedures Procedures    Medications Ordered in ED Medications  lactated ringers infusion (has no administration in time range)  alum & mag hydroxide-simeth (MAALOX/MYLANTA) 200-200-20 MG/5ML suspension 30 mL (has no administration in time range)  sucralfate (CARAFATE) tablet 1 g (has no  administration in time range)  famotidine (PEPCID) tablet 40 mg (has no administration in time range)    ED Course/ Medical Decision Making/ A&P                           Medical Decision Making Amount and/or Complexity of Data Reviewed Labs: ordered. Radiology: ordered. ECG/medicine tests: ordered.  Risk OTC drugs. Prescription drug management.   Patient's EKG per interpretation shows normal sinus rhythm.  No signs of acute ischemic changes.  Patient presented with  abdominal discomfort and shortness of breath.  Concern for possible PE as she was very tachypneic.  CT of the chest my interpretation showed no acute findings.  Patient also had abdominal discomfort and abdominal CT per interpretation showed no evidence of perforation.  Was treated for suspected reflux and felt better.  Considered ACS but troponin negative.  Feel less likely.  Patient's electrolytes significant for hyponatremia.  Patient has history of same.  Was started with gentle hydration with normal saline.  Patient will require admission.  Will consult hospitalist for admission.  Discussed findings with daughter who is at bedside who is agreeable        Final Clinical Impression(s) / ED Diagnoses Final diagnoses:  None    Rx / DC Orders ED Discharge Orders     None         Lacretia Leigh, MD 05/08/22 1722

## 2022-05-09 ENCOUNTER — Observation Stay (HOSPITAL_COMMUNITY): Payer: Medicare PPO

## 2022-05-09 DIAGNOSIS — I110 Hypertensive heart disease with heart failure: Secondary | ICD-10-CM | POA: Diagnosis present

## 2022-05-09 DIAGNOSIS — D509 Iron deficiency anemia, unspecified: Secondary | ICD-10-CM | POA: Diagnosis present

## 2022-05-09 DIAGNOSIS — E039 Hypothyroidism, unspecified: Secondary | ICD-10-CM | POA: Diagnosis present

## 2022-05-09 DIAGNOSIS — D649 Anemia, unspecified: Secondary | ICD-10-CM | POA: Diagnosis not present

## 2022-05-09 DIAGNOSIS — N179 Acute kidney failure, unspecified: Secondary | ICD-10-CM

## 2022-05-09 DIAGNOSIS — Z823 Family history of stroke: Secondary | ICD-10-CM | POA: Diagnosis not present

## 2022-05-09 DIAGNOSIS — I447 Left bundle-branch block, unspecified: Secondary | ICD-10-CM | POA: Diagnosis present

## 2022-05-09 DIAGNOSIS — R296 Repeated falls: Secondary | ICD-10-CM | POA: Diagnosis present

## 2022-05-09 DIAGNOSIS — R000 Tachycardia, unspecified: Secondary | ICD-10-CM | POA: Diagnosis present

## 2022-05-09 DIAGNOSIS — D72819 Decreased white blood cell count, unspecified: Secondary | ICD-10-CM | POA: Diagnosis present

## 2022-05-09 DIAGNOSIS — R079 Chest pain, unspecified: Secondary | ICD-10-CM | POA: Diagnosis not present

## 2022-05-09 DIAGNOSIS — B9620 Unspecified Escherichia coli [E. coli] as the cause of diseases classified elsewhere: Secondary | ICD-10-CM | POA: Diagnosis present

## 2022-05-09 DIAGNOSIS — S42301D Unspecified fracture of shaft of humerus, right arm, subsequent encounter for fracture with routine healing: Secondary | ICD-10-CM | POA: Diagnosis not present

## 2022-05-09 DIAGNOSIS — M810 Age-related osteoporosis without current pathological fracture: Secondary | ICD-10-CM | POA: Diagnosis present

## 2022-05-09 DIAGNOSIS — Z803 Family history of malignant neoplasm of breast: Secondary | ICD-10-CM | POA: Diagnosis not present

## 2022-05-09 DIAGNOSIS — Z66 Do not resuscitate: Secondary | ICD-10-CM | POA: Diagnosis present

## 2022-05-09 DIAGNOSIS — R0602 Shortness of breath: Secondary | ICD-10-CM | POA: Diagnosis not present

## 2022-05-09 DIAGNOSIS — R0609 Other forms of dyspnea: Secondary | ICD-10-CM

## 2022-05-09 DIAGNOSIS — Z79899 Other long term (current) drug therapy: Secondary | ICD-10-CM | POA: Diagnosis not present

## 2022-05-09 DIAGNOSIS — N390 Urinary tract infection, site not specified: Secondary | ICD-10-CM | POA: Diagnosis present

## 2022-05-09 DIAGNOSIS — R0789 Other chest pain: Secondary | ICD-10-CM | POA: Diagnosis not present

## 2022-05-09 DIAGNOSIS — Z1152 Encounter for screening for COVID-19: Secondary | ICD-10-CM | POA: Diagnosis not present

## 2022-05-09 DIAGNOSIS — K219 Gastro-esophageal reflux disease without esophagitis: Secondary | ICD-10-CM | POA: Diagnosis present

## 2022-05-09 DIAGNOSIS — Z833 Family history of diabetes mellitus: Secondary | ICD-10-CM | POA: Diagnosis not present

## 2022-05-09 DIAGNOSIS — Z87440 Personal history of urinary (tract) infections: Secondary | ICD-10-CM | POA: Diagnosis not present

## 2022-05-09 DIAGNOSIS — I5042 Chronic combined systolic (congestive) and diastolic (congestive) heart failure: Secondary | ICD-10-CM | POA: Diagnosis present

## 2022-05-09 DIAGNOSIS — R072 Precordial pain: Secondary | ICD-10-CM | POA: Diagnosis present

## 2022-05-09 DIAGNOSIS — R1013 Epigastric pain: Secondary | ICD-10-CM | POA: Diagnosis not present

## 2022-05-09 DIAGNOSIS — E871 Hypo-osmolality and hyponatremia: Secondary | ICD-10-CM | POA: Diagnosis present

## 2022-05-09 DIAGNOSIS — W19XXXD Unspecified fall, subsequent encounter: Secondary | ICD-10-CM | POA: Diagnosis present

## 2022-05-09 LAB — URINALYSIS, COMPLETE (UACMP) WITH MICROSCOPIC
Bilirubin Urine: NEGATIVE
Glucose, UA: NEGATIVE mg/dL
Ketones, ur: NEGATIVE mg/dL
Nitrite: POSITIVE — AB
Protein, ur: NEGATIVE mg/dL
Specific Gravity, Urine: <1.005 — ABNORMAL LOW (ref 1.005–1.030)
Squamous Epithelial / HPF: NONE SEEN (ref 0–5)
pH: 6 (ref 5.0–8.0)

## 2022-05-09 LAB — CBC WITH DIFFERENTIAL/PLATELET
Abs Immature Granulocytes: 0.02 10*3/uL (ref 0.00–0.07)
Basophils Absolute: 0 10*3/uL (ref 0.0–0.1)
Basophils Relative: 0 %
Eosinophils Absolute: 0.1 10*3/uL (ref 0.0–0.5)
Eosinophils Relative: 1 %
HCT: 32.6 % — ABNORMAL LOW (ref 36.0–46.0)
Hemoglobin: 10.9 g/dL — ABNORMAL LOW (ref 12.0–15.0)
Immature Granulocytes: 1 %
Lymphocytes Relative: 30 %
Lymphs Abs: 1.1 10*3/uL (ref 0.7–4.0)
MCH: 30.4 pg (ref 26.0–34.0)
MCHC: 33.4 g/dL (ref 30.0–36.0)
MCV: 90.8 fL (ref 80.0–100.0)
Monocytes Absolute: 0.6 10*3/uL (ref 0.1–1.0)
Monocytes Relative: 15 %
Neutro Abs: 2 10*3/uL (ref 1.7–7.7)
Neutrophils Relative %: 53 %
Platelets: 175 10*3/uL (ref 150–400)
RBC: 3.59 MIL/uL — ABNORMAL LOW (ref 3.87–5.11)
RDW: 15.7 % — ABNORMAL HIGH (ref 11.5–15.5)
WBC: 3.8 10*3/uL — ABNORMAL LOW (ref 4.0–10.5)
nRBC: 0 % (ref 0.0–0.2)

## 2022-05-09 LAB — COMPREHENSIVE METABOLIC PANEL
ALT: 13 U/L (ref 0–44)
AST: 22 U/L (ref 15–41)
Albumin: 3.1 g/dL — ABNORMAL LOW (ref 3.5–5.0)
Alkaline Phosphatase: 81 U/L (ref 38–126)
Anion gap: 10 (ref 5–15)
BUN: 8 mg/dL (ref 8–23)
CO2: 20 mmol/L — ABNORMAL LOW (ref 22–32)
Calcium: 8.1 mg/dL — ABNORMAL LOW (ref 8.9–10.3)
Chloride: 99 mmol/L (ref 98–111)
Creatinine, Ser: 1.26 mg/dL — ABNORMAL HIGH (ref 0.44–1.00)
GFR, Estimated: 42 mL/min — ABNORMAL LOW (ref >60)
Glucose, Bld: 74 mg/dL (ref 70–99)
Potassium: 3.9 mmol/L (ref 3.5–5.1)
Sodium: 129 mmol/L — ABNORMAL LOW (ref 135–145)
Total Bilirubin: 1 mg/dL (ref 0.3–1.2)
Total Protein: 5.8 g/dL — ABNORMAL LOW (ref 6.5–8.1)

## 2022-05-09 LAB — ECHOCARDIOGRAM COMPLETE
AR max vel: 1.88 cm2
AV Area VTI: 2.54 cm2
AV Area mean vel: 2.17 cm2
AV Mean grad: 7 mmHg
AV Peak grad: 13.8 mmHg
Ao pk vel: 1.86 m/s
Area-P 1/2: 3.5 cm2
Calc EF: 49 %
Height: 69 in
MV M vel: 4.63 m/s
MV Peak grad: 85.9 mmHg
P 1/2 time: 511 msec
S' Lateral: 3 cm
Single Plane A2C EF: 43.7 %
Single Plane A4C EF: 51.1 %
Weight: 2400 oz

## 2022-05-09 LAB — BASIC METABOLIC PANEL
Anion gap: 7 (ref 5–15)
BUN: 10 mg/dL (ref 8–23)
CO2: 24 mmol/L (ref 22–32)
Calcium: 8.1 mg/dL — ABNORMAL LOW (ref 8.9–10.3)
Chloride: 99 mmol/L (ref 98–111)
Creatinine, Ser: 1.3 mg/dL — ABNORMAL HIGH (ref 0.44–1.00)
GFR, Estimated: 41 mL/min — ABNORMAL LOW (ref >60)
Glucose, Bld: 82 mg/dL (ref 70–99)
Potassium: 3.9 mmol/L (ref 3.5–5.1)
Sodium: 130 mmol/L — ABNORMAL LOW (ref 135–145)

## 2022-05-09 LAB — HEMOGLOBIN A1C
Hgb A1c MFr Bld: 4.9 % (ref 4.8–5.6)
Mean Plasma Glucose: 93.93 mg/dL

## 2022-05-09 LAB — MAGNESIUM: Magnesium: 2.3 mg/dL (ref 1.7–2.4)

## 2022-05-09 LAB — CREATININE, URINE, RANDOM: Creatinine, Urine: 18 mg/dL

## 2022-05-09 LAB — TSH: TSH: 2.689 u[IU]/mL (ref 0.350–4.500)

## 2022-05-09 LAB — SODIUM, URINE, RANDOM: Sodium, Ur: 12 mmol/L

## 2022-05-09 LAB — PHOSPHORUS: Phosphorus: 3.4 mg/dL (ref 2.5–4.6)

## 2022-05-09 MED ORDER — SODIUM CHLORIDE 0.9 % IV SOLN: INTRAVENOUS | Status: AC

## 2022-05-09 NOTE — Progress Notes (Signed)
  Echocardiogram 2D Echocardiogram has been performed.  Lana Fish 05/09/2022, 1:11 PM

## 2022-05-09 NOTE — Progress Notes (Signed)
PROGRESS NOTE    Christy Wade  YQI:[id number] DOB: 11/09/1935 DOA: 05/08/2022 PCP: Luetta Nutting, DO   Brief Narrative:  Christy Wade is a 86 y.o. female with medical history significant for but not limited to hypertension, hypothyroidism, history of a cataract, history of cardiac disease and history of chronic combined systolic and diastolic CHF as well as a recent fall with a spiral humeral fracture, GERD as well as other comorbidities including a history of hyponatremia who presents with epigastric and mid chest discomfort as well as dyspnea on exertion with "frequent burping".  Patient states that about a week ago she started having some reflux symptoms which progressively worsened throughout the week.  Also noticed that she is getting more more short of breath over the last week or so especially with ambulation and exertion.  States that she has had some diarrhea which is chronic for her and is "nervous".  Had a fall about a month ago and broke her right humerus and was post to see the orthopedic surgeon Dr. Doy Mince on Monday.  Patient states that she "spit up" once and continues to abdomen burning epigastric and midsternal chest pain.  States this is happened to her before in the past and it feels like it was before when the sodium pills that she was taken for prior hyponatremia caused esophageal irritation.  She denies any food getting stuck in either leg pain or swelling.  She did have some chest discomfort and shortness of breath but denies any orthopnea.  States that the cast in her right arm is causing her pain and digging into her skin.  Given her symptoms she presented to the ED for further evaluation and [facility] was asked to admit this patient for epigastric and chest discomfort with reflux symptoms as well as dyspnea on exertion.  Further work-up revealed that she was hyponatremic with a sodium of 121.   **Sodium is improving with fluid resuscitation and sodium is now 129.   Patient feels better and states that she is getting close to her baseline and chest pain has resolved.  She states that she slept well.  We will recheck her sodium and if she is stable in the morning after PT OT evaluation we can discharge her so that she can follow-up with her primary orthopedic team for further evaluation of her spiral humeral fracture.   Assessment and Plan: No notes have been filed under this hospital service. Service: Hospitalist  Chest pain rule out ACS -Patient is having chest discomfort and dyspnea and she states that this is a little bit different than what she remembers from her prior cardiac event where she ended up having chronic combined systolic and diastolic CHF -Checking BNP and was 91.9 as below -Troponins are flat negative and initial troponin was 8 and repeat troponin was 10 -EKG showed sinus rhythm but did show a left bundle branch block -Ordered echocardiogram for further evaluation and shows an EF of 55 to 60% with indeterminate -We will continue monitor telemetry -CTA of the chest done and showed no definite evidence of pulm embolus but she did have some mild right posterior lobe scarring or subsegmental atelectasis that was noted due to an old adjacent displaced rib fracture -May need to consult cardiology for further evaluation but since her symptoms have resolved will refer to outpatient follow-up given normal EF   Epigastric discomfort with reflux symptoms, improving -Patient states for the last week she has been having worsening reflux symptoms and states  that she has "spit up" -Increase PPI from 40 mg daily to twice daily we will continue -In the ED she received famotidine p.o., sucralfate 1 g p.o. once as well as Maalox/Mylanta -CT scan of the abdomen pelvis done and showed no acute abnormality with regards to the stomach and small bowel.  No obstruction noted. -Check Lipase Level was mildly elevated at 54 -CT scan did mention that she had sinus  mild circumferential wall thickening of the urinary bladder so we will obtain a urinalysis -Patient is concerned about swallowing solid food so we will obtain SLP evaluation and may need GI evaluation for further evaluation recommendations but this is improved   Acute Hyponatremia -Possibly in the setting of her diuretics and poor p.o. intake due to reflux -Sodium was 121 on admission and improved to 129 with fluid hydration -Holding her furosemide Entresto and Lasix given that she appears clinically dry and has an AKI -Continue IV fluid hydration throughout the night and check BMP this evening   AKI, stable -Mild.  Baseline creatinine of 0.8 and she presents with a BUN/creatinine of 10/1.25 and is now 8/1.26 today -Avoiding nephrotoxic medication and holding medications as above -She has been getting gentle IV fluid hydration and will continue normal saline at 75 MLS per hour today -Check urinalysis and urine sodium, urine creatinine, urine osmolality -Avoid further nephrotoxic medications, contrast dyes, hypotension and dehydration to ensure adequate renal perfusion -Repeat CMP in a.m.   ? Cystitis -Urinalysis done and showed clear appearance with yellow color urine, with trace hemoglobin, moderate leukocytes, positive nitrites, urine specific gravity of less than 1.005, many bacteria, 6-10 RBCs per high-power field, 21-50 WBCs -CT scan mentioned mild circumferential wall thickening of the urinary bladder -Checking urine culture we will monitor off of antibiotics given that she is afebrile and only has a mild leukopenia  Dyspnea on exertion, improved -Check COVID and influenza A and influenza B via PCR -CTA of the chest showed no evidence of PE but did show mild right posterior lobe scarring or subsegmental atelectasis -Check BNP and echocardiogram given her history of heart failure but she appears clinically dry currently; BNP was 91.9 -Echocardiogram done and shows "Left Ventricle: Left  ventricular ejection fraction, by estimation, is 55 to 60%. The left ventricle has normal function. The left ventricle has no regional wall motion abnormalities. The left ventricular internal cavity  size was normal in size. There is   no left ventricular hypertrophy. Left ventricular diastolic parameters  are indeterminate. " -Start flutter valve and incentive spirometry -Repeat chest x-ray in a.m. -Awaiting PT OT evaluation I will do an ambulatory home O2 screen prior to discharge   Right humeral spiral fracture -Has a hard cast on which is causing discomfort -We will need to have Orth[employer] adjust -Supposed to see Dr. Doy Mince in orthopedic surgery on Monday and if she is improved she can be discharged tomorrow morning and follow-up with them directly after hospitalization -May need to discuss with orthopedic surgery while she is here -Continue with pain control with p.o. tramadol 25 mg and she states that she feels much better with the tramadol   Leukopenia -Mild and WBC 3.4 and improved to 3.8 -Continue to monitor trend and repeat CBC in a.m and continue to monitor for signs and symptoms of infection   Normocytic anemia -Patient's hemoglobin/hematocrit is now 11.2/33.3 yesterday and today it is now 10.9/32.6 -Check anemia panel in the a.m. -Continue to monitor for signs of bleeding; no overt bleeding noted -  Repeat CBC in a.m.   DVT prophylaxis: heparin injection 5,000 Units Start: 05/08/22 2315 SCDs Start: 05/08/22 2222    Code Status: DNR Family Communication: No family currently at bedside  Disposition Plan:  Level of care: Telemetry Status is: Inpatient Remains inpatient appropriate because: She is improving and sodium is trending up.  We will need PT and OT to further evaluate for safe discharge disposition   Consultants:  None  Procedures:  As delineated as above  Antimicrobials:  Anti-infectives (From admission, onward)    None       Subjective: Seen and  examined at bedside and states that she is doing much better today than she was yesterday.  States her chest discomfort and epigastric pain is improved.  She denies any nausea or vomiting.  Tolerating her meals without issues.  Objective: Vitals:   05/09/22 0643 05/09/22 1010 05/09/22 1012 05/09/22 1335  BP: 137/67 (!) 111/53 132/69 (!) 111/55  Pulse: 88 (!) 107 100 82  Resp: '17 16 16 16  '$ Temp: (!) 97.4 F (36.3 C) 97.8 F (36.6 C) 97.9 F (36.6 C) 98.3 F (36.8 C)  TempSrc: Oral Oral Oral Oral  SpO2: 99% 100% 100% 100%  Weight:      Height:        Intake/Output Summary (Last 24 hours) at 05/09/2022 1556 Last data filed at 05/09/2022 1300 Gross per 24 hour  Intake 1567.3 ml  Output 1200 ml  Net 367.3 ml   Filed Weights   05/08/22 1237  Weight: 68 kg   Examination: Physical Exam:  Constitutional: WN/WD chronically ill-appearing Caucasian female currently no acute distress Respiratory: Diminished to auscultation bilaterally with coarse breath sounds, no wheezing, rales, rhonchi or crackles. Normal respiratory effort and patient is not tachypenic. No accessory muscle use.  Unlabored breathing Cardiovascular: RRR, no murmurs / rubs / gallops. S1 and S2 auscultated. No extremity edema. Abdomen: Soft, non-tender, mildly distended. Bowel sounds positive.  GU: Deferred. Musculoskeletal: Right shoulder is immobilized Skin: No rashes, lesions, ulcers. No induration; Warm and dry.  Neurologic: CN 2-12 grossly intact with no focal deficits. Romberg sign and cerebellar reflexes not assessed.  Psychiatric: Normal judgment and insight. Alert and oriented x 3. Normal mood and appropriate affect.   Data Reviewed: I have personally reviewed following labs and imaging studies  CBC: Recent Labs  Lab 05/08/22 1402 05/09/22 0634  WBC 3.4* 3.8*  NEUTROABS 2.1 2.0  HGB 11.2* 10.9*  HCT 33.3* 32.6*  MCV 89.5 90.8  PLT 193 376   Basic Metabolic Panel: Recent Labs  Lab 05/08/22 1402  05/09/22 0634  NA 121* 129*  K 3.8 3.9  CL 87* 99  CO2 26 20*  GLUCOSE 90 74  BUN 10 8  CREATININE 1.25* 1.26*  CALCIUM 8.3* 8.1*  MG  --  2.3  PHOS  --  3.4   GFR: Estimated Creatinine Clearance: 35.4 mL/min (A) (by C-G formula based on SCr of 1.26 mg/dL (H)). Liver Function Tests: Recent Labs  Lab 05/08/22 1402 05/09/22 0634  AST 27 22  ALT 14 13  ALKPHOS 88 81  BILITOT 1.3* 1.0  PROT 6.6 5.8*  ALBUMIN 3.5 3.1*   Recent Labs  Lab 05/08/22 1402 05/08/22 2249  LIPASE 45 54*   No results for input(s): "AMMONIA" in the last 168 hours. Coagulation Profile: No results for input(s): "INR", "PROTIME" in the last 168 hours. Cardiac Enzymes: No results for input(s): "CKTOTAL", "CKMB", "CKMBINDEX", "TROPONINI" in the last 168 hours. BNP (last 3 results)  No results for input(s): "PROBNP" in the last 8760 hours. HbA1C: Recent Labs    05/08/22 2249  HGBA1C 4.9   CBG: No results for input(s): "GLUCAP" in the last 168 hours. Lipid Profile: No results for input(s): "CHOL", "HDL", "LDLCALC", "TRIG", "CHOLHDL", "LDLDIRECT" in the last 72 hours. Thyroid Function Tests: Recent Labs    05/08/22 2249  TSH 2.689   Anemia Panel: No results for input(s): "VITAMINB12", "FOLATE", "FERRITIN", "TIBC", "IRON", "RETICCTPCT" in the last 72 hours. Sepsis Labs: No results for input(s): "PROCALCITON", "LATICACIDVEN" in the last 168 hours.  Recent Results (from the past 240 hour(s))  Resp Panel by RT-PCR (Flu A&B, Covid) Anterior Nasal Swab     Status: None   Collection Time: 05/08/22  5:58 PM   Specimen: Anterior Nasal Swab  Result Value Ref Range Status   SARS Coronavirus 2 by RT PCR NEGATIVE NEGATIVE Final    Comment: (NOTE) SARS-CoV-2 target nucleic acids are NOT DETECTED.  The SARS-CoV-2 RNA is generally detectable in upper respiratory specimens during the acute phase of infection. The lowest concentration of SARS-CoV-2 viral copies this assay can detect is 138 copies/mL. A  negative result does not preclude SARS-Cov-2 infection and should not be used as the sole basis for treatment or other patient management decisions. A negative result may occur with  improper specimen collection/handling, submission of specimen other than nasopharyngeal swab, presence of viral mutation(s) within the areas targeted by this assay, and inadequate number of viral copies(<138 copies/mL). A negative result must be combined with clinical observations, patient history, and epidemiological information. The expected result is Negative.  Fact Sheet for Patients:  EntrepreneurPulse.com.au  Fact Sheet for Healthcare Providers:  IncredibleEmployment.be  This test is no t yet approved or cleared by the [place] FDA and  has been authorized for detection and/or diagnosis of SARS-CoV-2 by FDA under an Emergency Use Authorization (EUA). This EUA will remain  in effect (meaning this test can be used) for the duration of the COVID-19 declaration under Section 564(b)(1) of the Act, 21 U.S.C.section 360bbb-3(b)(1), unless the authorization is terminated  or revoked sooner.       Influenza A by PCR NEGATIVE NEGATIVE Final   Influenza B by PCR NEGATIVE NEGATIVE Final    Comment: (NOTE) The Xpert Xpress SARS-CoV-2/FLU/RSV plus assay is intended as an aid in the diagnosis of influenza from Nasopharyngeal swab specimens and should not be used as a sole basis for treatment. Nasal washings and aspirates are unacceptable for Xpert Xpress SARS-CoV-2/FLU/RSV testing.  Fact Sheet for Patients: EntrepreneurPulse.com.au  Fact Sheet for Healthcare Providers: IncredibleEmployment.be  This test is not yet approved or cleared by the [place] FDA and has been authorized for detection and/or diagnosis of SARS-CoV-2 by FDA under an Emergency Use Authorization (EUA). This EUA will remain in effect (meaning this test can  be used) for the duration of the COVID-19 declaration under Section 564(b)(1) of the Act, 21 U.S.C. section 360bbb-3(b)(1), unless the authorization is terminated or revoked.  Performed at [facility], [place] [address] [place], [place] [address]     Radiology Studies: ECHOCARDIOGRAM COMPLETE  Result Date: 05/09/2022    ECHOCARDIOGRAM REPORT   Patient Name:   KEANDRA MEDERO Date of Exam: 05/09/2022 Medical Rec #:  [id number]       Height:       69.0 in Accession #:    [id number]      Weight:       150.0 lb Date of Birth:  [birth year]      BSA:          1.828 m Patient Age:    53 years        BP:           137/67 mmHg Patient Gender: F               HR:           85 bpm. Exam Location:  Inpatient Procedure: 2D Echo Indications:    Dyspnea  History:        Patient has prior history of Echocardiogram examinations, most                 recent 06/12/2020. Risk Factors:Hypertension.  Sonographer:    Harvie Junior Referring Phys: [id number] Alnisa Hasley LATIF [place]  1. Left ventricular ejection fraction, by estimation, is 55 to 60%. The left ventricle has normal function. The left ventricle has no regional wall motion abnormalities. Left ventricular diastolic parameters are indeterminate.  2. Right ventricular systolic function is normal. The right ventricular size is normal. There is normal pulmonary artery systolic pressure.  3. The mitral valve is normal in structure. Mild mitral valve regurgitation. No evidence of mitral stenosis.  4. The tricuspid valve is abnormal.  5. The aortic valve was not well visualized. There is mild calcification of the aortic valve. There is mild thickening of the aortic valve. Aortic valve regurgitation is mild. No aortic stenosis is present.  6. The inferior vena cava is normal in size with <50% respiratory variability, suggesting right atrial pressure of 8 mmHg. FINDINGS  Left Ventricle: Left ventricular ejection fraction, by estimation, is 55 to 60%. The  left ventricle has normal function. The left ventricle has no regional wall motion abnormalities. The left ventricular internal cavity size was normal in size. There is  no left ventricular hypertrophy. Left ventricular diastolic parameters are indeterminate. Right Ventricle: The right ventricular size is normal. Right vetricular wall thickness was not well visualized. Right ventricular systolic function is normal. There is normal pulmonary artery systolic pressure. The tricuspid regurgitant velocity is 2.50 m/s, and with an assumed right atrial pressure of 3 mmHg, the estimated right ventricular systolic pressure is 97.9 mmHg. Left Atrium: Left atrial size was normal in size. Right Atrium: Right atrial size was normal in size. Pericardium: There is no evidence of pericardial effusion. Mitral Valve: The mitral valve is normal in structure. Mild mitral valve regurgitation. No evidence of mitral valve stenosis. Tricuspid Valve: The tricuspid valve is abnormal. Tricuspid valve regurgitation is mild . No evidence of tricuspid stenosis. Aortic Valve: The aortic valve was not well visualized. There is mild calcification of the aortic valve. There is mild thickening of the aortic valve. There is mild aortic valve annular calcification. Aortic valve regurgitation is mild. Aortic regurgitation PHT measures 511 msec. No aortic stenosis is present. Aortic valve mean gradient measures 7.0 mmHg. Aortic valve peak gradient measures 13.8 mmHg. Aortic valve area, by VTI measures 2.54 cm. Pulmonic Valve: The pulmonic valve was not well visualized. Pulmonic valve regurgitation is not visualized. No evidence of pulmonic stenosis. Aorta: The aortic root is normal in size and structure. Venous: The inferior vena cava is normal in size with less than 50% respiratory variability, suggesting right atrial pressure of 8 mmHg. IAS/Shunts: No atrial level shunt detected by color flow Doppler.  LEFT VENTRICLE PLAX 2D LVIDd:         3.70 cm      Diastology LVIDs:  3.00 cm     LV e' medial:    5.77 cm/s LV PW:         1.00 cm     LV E/e' medial:  16.5 LV IVS:        1.00 cm     LV e' lateral:   10.20 cm/s LVOT diam:     2.00 cm     LV E/e' lateral: 9.3 LV SV:         73 LV SV Index:   40 LVOT Area:     3.14 cm  LV Volumes (MOD) LV vol d, MOD A2C: 82.6 ml LV vol d, MOD A4C: 76.1 ml LV vol s, MOD A2C: 46.5 ml LV vol s, MOD A4C: 37.2 ml LV SV MOD A2C:     36.1 ml LV SV MOD A4C:     76.1 ml LV SV MOD BP:      41.4 ml RIGHT VENTRICLE RV Basal diam:  4.40 cm RV Mid diam:    3.30 cm RV S prime:     15.40 cm/s TAPSE (M-mode): 2.7 cm LEFT ATRIUM             Index        RIGHT ATRIUM           Index LA diam:        3.20 cm 1.75 cm/m   RA Area:     14.90 cm LA Vol (A2C):   62.6 ml 34.24 ml/m  RA Volume:   44.40 ml  24.29 ml/m LA Vol (A4C):   49.2 ml 26.91 ml/m LA Biplane Vol: 56.0 ml 30.63 ml/m  AORTIC VALVE                     PULMONIC VALVE AV Area (Vmax):    1.88 cm      PV Vmax:       1.26 m/s AV Area (Vmean):   2.17 cm      PV Peak grad:  6.4 mmHg AV Area (VTI):     2.54 cm AV Vmax:           185.50 cm/s AV Vmean:          118.000 cm/s AV VTI:            0.286 m AV Peak Grad:      13.8 mmHg AV Mean Grad:      7.0 mmHg LVOT Vmax:         111.00 cm/s LVOT Vmean:        81.400 cm/s LVOT VTI:          0.231 m LVOT/AV VTI ratio: 0.81 AI PHT:            511 msec  AORTA Ao Root diam: 3.30 cm Ao Asc diam:  2.50 cm MITRAL VALVE                TRICUSPID VALVE MV Area (PHT): 3.50 cm     TR Peak grad:   25.0 mmHg MV Decel Time: 217 msec     TR Vmax:        250.00 cm/s MR Peak grad: 85.9 mmHg MR Vmax:      463.33 cm/s   SHUNTS MV E velocity: 95.10 cm/s   Systemic VTI:  0.23 m MV A velocity: 116.00 cm/s  Systemic Diam: 2.00 cm MV E/A ratio:  0.82 Carlyle Dolly MD Electronically signed by Carlyle Dolly MD Signature Date/Time: 05/09/2022/1:51:18 PM  Final    CT Angio Chest PE W/Cm &/Or Wo Cm  Result Date: 05/08/2022 CLINICAL DATA:  Chest pain. EXAM:  CT ANGIOGRAPHY CHEST WITH CONTRAST TECHNIQUE: Multidetector CT imaging of the chest was performed using the standard protocol during bolus administration of intravenous contrast. Multiplanar CT image reconstructions and MIPs were obtained to evaluate the vascular anatomy. RADIATION DOSE REDUCTION: This exam was performed according to the departmental dose-optimization program which includes automated exposure control, adjustment of the mA and/or kV according to patient size and/or use of iterative reconstruction technique. CONTRAST:  166m OMNIPAQUE IOHEXOL 350 MG/ML SOLN COMPARISON:  June 12, 2020. FINDINGS: Cardiovascular: Satisfactory opacification of the pulmonary arteries to the segmental level. No evidence of pulmonary embolism. Normal heart size. No pericardial effusion. Mediastinum/Nodes: No enlarged mediastinal, hilar, or axillary lymph nodes. Thyroid gland, trachea, and esophagus demonstrate no significant findings. Lungs/Pleura: No pneumothorax or pleural effusion is noted. Mild right posterior lower lobe scarring or subsegmental atelectasis due to old adjacent rib fracture. Upper Abdomen: No acute abnormality. Musculoskeletal: Stable appearance of old right posterior rib fractures. No acute osseous abnormality is noted. Review of the MIP images confirms the above findings. IMPRESSION: No definite evidence of pulmonary embolus. Mild right posterior lower lobe scarring or subsegmental atelectasis is noted due to old adjacent displaced right rib fracture. Aortic Atherosclerosis (ICD10-I70.0). Electronically Signed   By: JMarijo ConceptionM.D.   On: 05/08/2022 15:34   CT Abdomen Pelvis W Contrast  Result Date: 05/08/2022 CLINICAL DATA:  Indigestion.  Abdominal pain. EXAM: CT ABDOMEN AND PELVIS WITH CONTRAST TECHNIQUE: Multidetector CT imaging of the abdomen and pelvis was performed using the standard protocol following bolus administration of intravenous contrast. RADIATION DOSE REDUCTION: This exam  was performed according to the departmental dose-optimization program which includes automated exposure control, adjustment of the mA and/or kV according to patient size and/or use of iterative reconstruction technique. CONTRAST:  1048mOMNIPAQUE IOHEXOL 350 MG/ML SOLN COMPARISON:  None Available. FINDINGS: Lower chest: Please see separately dictated CT chest for findings above the diaphragm. Hepatobiliary: Liver has a normal contour. No evidence of intra or extrahepatic biliary ductal dilatation. There is a small hypodense lesion in the anterior margin of the left hepatic lobe, too small to accurately characterize. Gallbladder is distended without findings to suggest cholecystitis. No evidence of cholelithiasis. Pancreas: No pancreatic ductal dilatation or surrounding inflammatory changes. Spleen: Normal in size without focal abnormality. Adrenals/Urinary Tract: Adrenal glands are unremarkable. Left kidney is normal in size without evidence of hydronephrosis or nephrolithiasis. The right kidney small in size with calcification noted at the origin of the right renal artery. Hypodense lesion along the posterior margin of the right kidney is favored to represent a renal cyst. The urinary bladder is fluid-filled and distended with mild circumferential wall thickening. Stomach/Bowel: Stomach, small bowel, large bowel are normal in caliber. No evidence of bowel obstruction.Prior appendectomy. There is a small bowel containing right inguinal hernia. Small hiatal hernia. Vascular/Lymphatic: No significant vascular findings are present. No enlarged abdominal or pelvic lymph nodes. Reproductive: Status post hysterectomy. No adnexal masses. Other: No abdominopelvic ascites. Musculoskeletal: No acute or significant osseous findings. Chronic degenerative changes of the lumbar spine without evidence of acute compression deformity. IMPRESSION: 1. No acute abdominal abnormality. 2. Mild circumferential wall thickening of the  urinary bladder, which is nonspecific, but could be seen with cystitis. Correlate with urinalysis. Electronically Signed   By: HeMarin Roberts.D.   On: 05/08/2022 15:28    Scheduled Meds:  (feeding  supplement) PROSource Plus  30 mL Oral Daily   cycloSPORINE  1 drop Both Eyes BID   heparin  5,000 Units Subcutaneous Q8H   hydrALAZINE  25 mg Oral Q8H   levothyroxine  88 mcg Oral Q0600   metoprolol succinate  50 mg Oral Daily   mirtazapine  7.5 mg Oral QHS   pantoprazole  40 mg Oral BID   Continuous Infusions:  sodium chloride 75 mL/hr at 05/09/22 0331    LOS: 0 days   Raiford Noble, DO [facility] Available via Epic secure chat 7am-7pm After these hours, please refer to coverage provider listed on [email or web address] 05/09/2022, 3:56 PM

## 2022-05-09 NOTE — Plan of Care (Signed)

## 2022-05-10 DIAGNOSIS — R0602 Shortness of breath: Secondary | ICD-10-CM | POA: Diagnosis not present

## 2022-05-10 DIAGNOSIS — R079 Chest pain, unspecified: Secondary | ICD-10-CM | POA: Diagnosis not present

## 2022-05-10 DIAGNOSIS — R3989 Other symptoms and signs involving the genitourinary system: Secondary | ICD-10-CM

## 2022-05-10 DIAGNOSIS — R1013 Epigastric pain: Secondary | ICD-10-CM | POA: Diagnosis not present

## 2022-05-10 DIAGNOSIS — E871 Hypo-osmolality and hyponatremia: Secondary | ICD-10-CM | POA: Diagnosis not present

## 2022-05-10 LAB — CBC WITH DIFFERENTIAL/PLATELET
Abs Immature Granulocytes: 0.01 10*3/uL (ref 0.00–0.07)
Basophils Absolute: 0 10*3/uL (ref 0.0–0.1)
Basophils Relative: 0 %
Eosinophils Absolute: 0 10*3/uL (ref 0.0–0.5)
Eosinophils Relative: 1 %
HCT: 31.3 % — ABNORMAL LOW (ref 36.0–46.0)
Hemoglobin: 10.2 g/dL — ABNORMAL LOW (ref 12.0–15.0)
Immature Granulocytes: 0 %
Lymphocytes Relative: 30 %
Lymphs Abs: 1.2 10*3/uL (ref 0.7–4.0)
MCH: 30 pg (ref 26.0–34.0)
MCHC: 32.6 g/dL (ref 30.0–36.0)
MCV: 92.1 fL (ref 80.0–100.0)
Monocytes Absolute: 0.6 10*3/uL (ref 0.1–1.0)
Monocytes Relative: 14 %
Neutro Abs: 2.1 10*3/uL (ref 1.7–7.7)
Neutrophils Relative %: 55 %
Platelets: 166 10*3/uL (ref 150–400)
RBC: 3.4 MIL/uL — ABNORMAL LOW (ref 3.87–5.11)
RDW: 16 % — ABNORMAL HIGH (ref 11.5–15.5)
WBC: 3.9 10*3/uL — ABNORMAL LOW (ref 4.0–10.5)
nRBC: 0 % (ref 0.0–0.2)

## 2022-05-10 LAB — COMPREHENSIVE METABOLIC PANEL
ALT: 13 U/L (ref 0–44)
AST: 19 U/L (ref 15–41)
Albumin: 2.8 g/dL — ABNORMAL LOW (ref 3.5–5.0)
Alkaline Phosphatase: 73 U/L (ref 38–126)
Anion gap: 8 (ref 5–15)
BUN: 10 mg/dL (ref 8–23)
CO2: 21 mmol/L — ABNORMAL LOW (ref 22–32)
Calcium: 7.7 mg/dL — ABNORMAL LOW (ref 8.9–10.3)
Chloride: 101 mmol/L (ref 98–111)
Creatinine, Ser: 1.35 mg/dL — ABNORMAL HIGH (ref 0.44–1.00)
GFR, Estimated: 39 mL/min — ABNORMAL LOW (ref >60)
Glucose, Bld: 84 mg/dL (ref 70–99)
Potassium: 3.5 mmol/L (ref 3.5–5.1)
Sodium: 130 mmol/L — ABNORMAL LOW (ref 135–145)
Total Bilirubin: 0.9 mg/dL (ref 0.3–1.2)
Total Protein: 5.5 g/dL — ABNORMAL LOW (ref 6.5–8.1)

## 2022-05-10 LAB — MAGNESIUM: Magnesium: 1.9 mg/dL (ref 1.7–2.4)

## 2022-05-10 LAB — PHOSPHORUS: Phosphorus: 3 mg/dL (ref 2.5–4.6)

## 2022-05-10 MED ORDER — SODIUM CHLORIDE 0.9 % IV SOLN
1.0000 g | INTRAVENOUS | Status: DC
  Administered 2022-05-10 – 2022-05-12 (×3): 1 g via INTRAVENOUS
  Filled 2022-05-10 (×3): qty 10

## 2022-05-10 MED ORDER — OYSTER SHELL CALCIUM/D3 500-5 MG-MCG PO TABS
1.0000 | ORAL_TABLET | Freq: Every day | ORAL | Status: DC
  Administered 2022-05-10 – 2022-05-13 (×4): 1 via ORAL
  Filled 2022-05-10 (×4): qty 1

## 2022-05-10 MED ORDER — SIMETHICONE 80 MG PO CHEW
125.0000 mg | CHEWABLE_TABLET | Freq: Four times a day (QID) | ORAL | Status: DC
  Administered 2022-05-10 – 2022-05-13 (×11): 120 mg via ORAL
  Filled 2022-05-10 (×13): qty 2

## 2022-05-10 MED ORDER — PREDNISOLONE ACETATE 1 % OP SUSP: 1.0000 [drp] | Freq: Two times a day (BID) | OPHTHALMIC | Status: DC

## 2022-05-10 MED ORDER — PREDNISOLONE ACETATE 1 % OP SUSP
1.0000 [drp] | Freq: Four times a day (QID) | OPHTHALMIC | Status: AC
  Administered 2022-05-10 – 2022-05-12 (×12): 1 [drp] via OPHTHALMIC
  Filled 2022-05-10: qty 5

## 2022-05-10 MED ORDER — LEVOTHYROXINE SODIUM 50 MCG PO TABS
75.0000 ug | ORAL_TABLET | Freq: Every day | ORAL | Status: DC
  Administered 2022-05-11 – 2022-05-13 (×3): 75 ug via ORAL
  Filled 2022-05-10 (×3): qty 1

## 2022-05-10 MED ORDER — SACUBITRIL-VALSARTAN 24-26 MG PO TABS
1.0000 | ORAL_TABLET | Freq: Two times a day (BID) | ORAL | Status: DC
  Administered 2022-05-10 – 2022-05-13 (×7): 1 via ORAL
  Filled 2022-05-10 (×7): qty 1

## 2022-05-10 MED ORDER — CALCIUM CARBONATE ANTACID 500 MG PO CHEW
1.0000 | CHEWABLE_TABLET | Freq: Three times a day (TID) | ORAL | Status: DC
  Administered 2022-05-10 – 2022-05-13 (×9): 200 mg via ORAL
  Filled 2022-05-10 (×10): qty 1

## 2022-05-10 MED ORDER — PANTOPRAZOLE SODIUM 40 MG PO TBEC
40.0000 mg | DELAYED_RELEASE_TABLET | Freq: Two times a day (BID) | ORAL | Status: DC
  Administered 2022-05-10 – 2022-05-13 (×7): 40 mg via ORAL
  Filled 2022-05-10 (×7): qty 1

## 2022-05-10 MED ORDER — POTASSIUM CHLORIDE 20 MEQ PO PACK
20.0000 meq | PACK | Freq: Every day | ORAL | Status: DC
  Administered 2022-05-10 – 2022-05-13 (×4): 20 meq via ORAL
  Filled 2022-05-10 (×4): qty 1

## 2022-05-10 MED ORDER — PREDNISOLONE ACETATE 1 % OP SUSP: 1.0000 [drp] | Freq: Every day | OPHTHALMIC | Status: DC

## 2022-05-10 MED ORDER — KETOROLAC TROMETHAMINE 0.5 % OP SOLN
1.0000 [drp] | Freq: Four times a day (QID) | OPHTHALMIC | Status: DC
  Administered 2022-05-10 – 2022-05-13 (×13): 1 [drp] via OPHTHALMIC
  Filled 2022-05-10 (×2): qty 5

## 2022-05-10 MED ORDER — TRAMADOL HCL 50 MG PO TABS
50.0000 mg | ORAL_TABLET | Freq: Two times a day (BID) | ORAL | Status: DC
  Administered 2022-05-10 – 2022-05-11 (×3): 50 mg via ORAL
  Filled 2022-05-10 (×3): qty 1

## 2022-05-10 MED ORDER — PREDNISOLONE ACETATE 1 % OP SUSP
1.0000 [drp] | Freq: Three times a day (TID) | OPHTHALMIC | Status: DC
  Administered 2022-05-13: 1 [drp] via OPHTHALMIC

## 2022-05-10 MED ORDER — MOXIFLOXACIN HCL 0.5 % OP SOLN
1.0000 [drp] | Freq: Three times a day (TID) | OPHTHALMIC | Status: DC
  Administered 2022-05-10 – 2022-05-13 (×10): 1 [drp] via OPHTHALMIC
  Filled 2022-05-10: qty 3

## 2022-05-10 MED ORDER — POLYVINYL ALCOHOL 1.4 % OP SOLN
1.0000 [drp] | Freq: Four times a day (QID) | OPHTHALMIC | Status: DC
  Administered 2022-05-10 – 2022-05-13 (×13): 1 [drp] via OPHTHALMIC
  Filled 2022-05-10: qty 15

## 2022-05-10 NOTE — Evaluation (Signed)
Occupational Therapy Evaluation Patient Details Name: Christy Wade MRN: [id number] DOB: 1936-02-26 Today's Date: 05/10/2022   History of Present Illness Patient is a 86 year old female who presented with mid chest and epigastric discomfort with reports of  frequent burping.patient was admitted with epigastric pain acute hyponatremia, AKI, and cystitis. Patient was noted to have had recent humral fracture with imaging revealing   "Oblique fracture of the proximal humeral diaphysis with 2 cm of  lateral displacement and 18 mm of overriding between the major  fracture fragments. Severe joint space narrowing of the right glenohumeral joint with  osteolysis of the humeral head as can be seen with a [place]  shoulder more severe advanced osteoarthritis." patient is being followed by Dr.Reynolds with last visit 10/9 with recommendations of NWB and sarmiento brace at all times per chart review. LZJ:[id number], HTN, hypothyroidism, anemia   Clinical Impression   Patient is an 86 year old female who was admitted for above. Patient was noted to be anxious about ortho follow up appointment this AM  with request to call daughter. Daughter was called and daughter was able to address questions with nurse as well. Patient was noted to have increased pain and discomfort in RUE especially with Sarmiento brace. Sarmiento brace was adjusted and RUE was supported with pillows but still not ideal fit with edema noted in RUE. Nurse made aware. Patient was one person HHA on L side  to transfer from bed to chair in room with safety cues. Patient anticipates being d/c back to [facility] at this time. Patient was noted to have decreased functional activity tolerance, decreased endurance, decreased standing balance, decreased safety awareness, and decreased knowledge of AD/AE impacting participation in ADLs. Patient would continue to benefit from skilled OT services at this time while admitted and after d/c to address noted  deficits in order to improve overall safety and independence in ADLs.       Recommendations for follow up therapy are one component of a multi-disciplinary discharge planning process, led by the attending physician.  Recommendations may be updated based on patient status, additional functional criteria and insurance authorization.   Follow Up Recommendations  Home health OT    Assistance Recommended at Discharge Frequent or constant Supervision/Assistance  Patient can return home with the following A little help with walking and/or transfers;A lot of help with bathing/dressing/bathroom;Assistance with cooking/housework;Direct supervision/assist for medications management;Assist for transportation;Help with stairs or ramp for entrance;Direct supervision/assist for financial management    Functional Status Assessment  Patient has had a recent decline in their functional status and demonstrates the ability to make significant improvements in function in a reasonable and predictable amount of time.  Equipment Recommendations  None recommended by OT    Recommendations for Other Services       Precautions / Restrictions Precautions Precaution Comments: sarmiento brace on RUE, ok for hand wrist and elbow ROM per Dr.Reynolds note 10/9 Restrictions Weight Bearing Restrictions: Yes RUE Weight Bearing: Non weight bearing      Mobility Bed Mobility Overal bed mobility: Needs Assistance Bed Mobility: Supine to Sit     Supine to sit: Min guard, HOB elevated     General bed mobility comments: safety    Transfers                          Balance  ADL either performed or assessed with clinical judgement   ADL Overall ADL's : Needs assistance/impaired Eating/Feeding: Minimal assistance;Sitting   Grooming: Set up;Oral care;Sitting Grooming Details (indicate cue type and reason): needed help to set up donning  toothpaste onto toothbrush with holding brush in R hand. Upper Body Bathing: Moderate assistance;Sitting   Lower Body Bathing: Maximal assistance;Bed level   Upper Body Dressing : Sitting;Moderate assistance;Maximal assistance   Lower Body Dressing: Bed level;Maximal assistance Lower Body Dressing Details (indicate cue type and reason): able to slide on shoes sitting EOB with set up and Supervision Toilet Transfer: Minimal assistance Toilet Transfer Details (indicate cue type and reason): HHA to transfer from EOB to recliner in room. Toileting- Clothing Manipulation and Hygiene: Maximal assistance;Sit to/from stand               Vision Patient Visual Report: No change from baseline       Perception     Praxis      Pertinent Vitals/Pain Pain Assessment Pain Assessment: Faces Faces Pain Scale: Hurts little more Pain Location: RUE Pain Descriptors / Indicators: Discomfort, Grimacing Pain Intervention(s): Limited activity within patient's tolerance, Monitored during session     Hand Dominance Right   Extremity/Trunk Assessment Upper Extremity Assessment Upper Extremity Assessment: RUE deficits/detail RUE Deficits / Details: concerns over sarmiento brace placement were brought to nurse on this date. patient was noted to have increased edema in axillary area impacting placement of splint. patient was able to squeeze hand but unable to AROM at elbow or tolerate therapist PROM at elbow at this time. RUE: Unable to fully assess due to pain   Lower Extremity Assessment Lower Extremity Assessment: Defer to PT evaluation   Cervical / Trunk Assessment Cervical / Trunk Assessment: Kyphotic   Communication Communication Communication: No difficulties   Cognition Arousal/Alertness: Awake/alert Behavior During Therapy: Anxious Overall Cognitive Status: Difficult to assess                                 General Comments: patient was oriented to hospital and date  but noted to keep asking if she was going to be picked up by transport and taken to ortho appointment today. patient was noted to have poor safety during session and some confusion as well.     General Comments  busing on L eyebrow laterally with edema noted in RUE as well. poor fit of sarmiento brace even with adjustments.    Exercises     Shoulder Instructions      Home Living Family/patient expects to be discharged to:: Assisted living                                 Additional Comments: patient reported having a wheelchair at facility.      Prior Functioning/Environment Prior Level of Function : Needs assist               ADLs Comments: with recent fx patient reported having increased A for ADLs from caregivers but was previously independent        OT Problem List: Decreased strength;Decreased range of motion;Decreased activity tolerance;Decreased coordination;Impaired UE functional use;Pain;Decreased cognition;Decreased safety awareness;Decreased knowledge of precautions      OT Treatment/Interventions: Self-care/ADL training;Therapeutic exercise;Neuromuscular education;Energy conservation;DME and/or AE instruction;Therapeutic activities;Balance training;Patient/family education    OT Goals(Current goals can be found in the care plan section) Acute Rehab  OT Goals Patient Stated Goal: to get back to [facility] OT Goal Formulation: With patient/family Time For Goal Achievement: 05/24/22 Potential to Achieve Goals: Fair  OT Frequency: Min 2X/week    Co-evaluation              AM-PAC OT "6 Clicks" Daily Activity     Outcome Measure Help from another person eating meals?: A Little Help from another person taking care of personal grooming?: A Little Help from another person toileting, which includes using toliet, bedpan, or urinal?: A Lot Help from another person bathing (including washing, rinsing, drying)?: A Lot Help from another person to put on  and taking off regular upper body clothing?: A Lot Help from another person to put on and taking off regular lower body clothing?: A Lot 6 Click Score: 14   End of Session Equipment Utilized During Treatment: Gait belt Nurse Communication: Mobility status;Other (comment) (concerns over sarmiento brace)  Activity Tolerance: Patient tolerated treatment well Patient left: in chair;with call bell/phone within reach;with chair alarm set  OT Visit Diagnosis: Unsteadiness on feet (R26.81);Other abnormalities of gait and mobility (R26.89);Muscle weakness (generalized) (M62.81);Pain                Time: 2229-7989 OT Time Calculation (min): 34 min Charges:  OT General Charges $OT Visit: 1 Visit OT Evaluation $OT Eval Low Complexity: 1 Low OT Treatments $Self Care/Home Management : 8-22 mins  Rennie Plowman, MS Acute Rehabilitation Department Office# [phone]   Marcellina Millin 05/10/2022, 10:29 AM

## 2022-05-10 NOTE — Evaluation (Signed)
Physical Therapy Evaluation Patient Details Name: Christy Wade MRN: [id number] DOB: 10/21/1935 Today's Date: 05/10/2022  History of Present Illness  Patient is a 86 year old female who presented with mid chest and epigastric discomfort with reports of  frequent burping.patient was admitted with epigastric pain acute hyponatremia, AKI, and cystitis. Patient was noted to have had recent humral fracture with imaging revealing   "Oblique fracture of the proximal humeral diaphysis with 2 cm of  lateral displacement and 18 mm of overriding between the major  fracture fragments. Severe joint space narrowing of the right glenohumeral joint with  osteolysis of the humeral head as can be seen with a [place]  shoulder more severe advanced osteoarthritis." patient is being followed by Dr. Doy Mince with last visit 10/9 with recommendations of NWB and sarmiento brace at all times per chart review. XIP:[id number], HTN, hypothyroidism, anemia  Clinical Impression  Pt admitted with above diagnosis.  Pt currently with functional limitations due to the deficits listed below (see PT Problem List). Pt will benefit from skilled PT to increase their independence and safety with mobility to allow discharge to the venue listed below.  Pt from ALF and reports most recently using w/c for safe mobility after recent nonoperative right humeral fx.  Pt reports ALF assists with housekeeping and meals as well as assists with ADLs at least twice weekly.  Pt feels safe to return to ALF and would like HHPT to improve strength, balance, and safely increasing mobility.        Recommendations for follow up therapy are one component of a multi-disciplinary discharge planning process, led by the attending physician.  Recommendations may be updated based on patient status, additional functional criteria and insurance authorization.  Follow Up Recommendations Home health PT      Assistance Recommended at Discharge PRN  Patient can  return home with the following  A lot of help with walking and/or transfers;Assistance with cooking/housework    Equipment Recommendations None recommended by PT  Recommendations for Other Services       Functional Status Assessment Patient has had a recent decline in their functional status and demonstrates the ability to make significant improvements in function in a reasonable and predictable amount of time.     Precautions / Restrictions Precautions Precautions: Fall Precaution Comments: sarmiento brace on RUE, ok for hand wrist and elbow ROM per Dr.Reynolds note 10/9 Restrictions Weight Bearing Restrictions: Yes RUE Weight Bearing: Non weight bearing      Mobility  Bed Mobility               General bed mobility comments: pt in recliner    Transfers Overall transfer level: Needs assistance Equipment used: None Transfers: Sit to/from Stand Sit to Stand: Min guard           General transfer comment: pt utilized armrest to self assist with L UE, pt keeps R UE NWB    Ambulation/Gait Ambulation/Gait assistance: Mod assist, Min assist Gait Distance (Feet): 120 Feet Assistive device: 1 person hand held assist Gait Pattern/deviations: Step-through pattern, Decreased stride length, Trunk flexed Gait velocity: decr     General Gait Details: provided L UE support, pt maintains R UE NWB, pt with more assist required for weakness and stability with increase in distance, right persistent lateral lean and pt not able to maintain correction  Stairs            Wheelchair Mobility    Modified Rankin (Stroke Patients Only)  Balance Overall balance assessment: History of Falls                                           Pertinent Vitals/Pain Pain Assessment Pain Assessment: Faces Faces Pain Scale: Hurts little more Pain Location: RUE Pain Descriptors / Indicators: Discomfort, Grimacing Pain Intervention(s): Monitored during  session, Repositioned    Home Living Family/patient expects to be discharged to:: Assisted living                 Home Equipment: Conservation officer, nature (2 wheels);Wheelchair - manual Additional Comments: patient reported having a wheelchair at facility.    Prior Function Prior Level of Function : Needs assist             Mobility Comments: was ambulatory with RW prior to humeral fx, most recently using w/c after fx for safety ADLs Comments: with recent fx patient reported having increased A for ADLs from caregivers but was previously independent     Hand Dominance   Dominant Hand: Right    Extremity/Trunk Assessment   Upper Extremity Assessment Upper Extremity Assessment: Defer to OT evaluation RUE Deficits / Details: concerns over sarmiento brace placement were brought to nurse on this date. patient was noted to have increased edema in axillary area impacting placement of splint. patient was able to squeeze hand but unable to AROM at elbow or tolerate therapist PROM at elbow at this time. RUE: Unable to fully assess due to pain    Lower Extremity Assessment Lower Extremity Assessment: Generalized weakness;LLE deficits/detail LLE Deficits / Details: bruising over left anterior knee observed    Cervical / Trunk Assessment Cervical / Trunk Assessment: Kyphotic  Communication   Communication: No difficulties  Cognition Arousal/Alertness: Awake/alert Behavior During Therapy: WFL for tasks assessed/performed Overall Cognitive Status: Within Functional Limits for tasks assessed                                 General Comments: appropriate during session        General Comments General comments (skin integrity, edema, etc.): busing on L eyebrow laterally with edema noted in RUE as well. poor fit of sarmiento brace even with adjustments.    Exercises     Assessment/Plan    PT Assessment Patient needs continued PT services  PT Problem List Decreased  strength;Decreased activity tolerance;Decreased balance;Decreased mobility;Decreased knowledge of precautions;Decreased knowledge of use of DME;Pain       PT Treatment Interventions Gait training;DME instruction;Therapeutic exercise;Functional mobility training;Therapeutic activities;Patient/family education;Balance training;Wheelchair mobility training;Neuromuscular re-education    PT Goals (Current goals can be found in the Care Plan section)  Acute Rehab PT Goals PT Goal Formulation: With patient Time For Goal Achievement: 05/24/22 Potential to Achieve Goals: Good    Frequency Min 3X/week     Co-evaluation               AM-PAC PT "6 Clicks" Mobility  Outcome Measure Help needed turning from your back to your side while in a flat bed without using bedrails?: A Little Help needed moving from lying on your back to sitting on the side of a flat bed without using bedrails?: A Little Help needed moving to and from a bed to a chair (including a wheelchair)?: A Little Help needed standing up from a chair using your arms (e.g., wheelchair  or bedside chair)?: A Little Help needed to walk in hospital room?: A Lot Help needed climbing 3-5 steps with a railing? : A Lot 6 Click Score: 16    End of Session Equipment Utilized During Treatment: Gait belt Activity Tolerance: Patient tolerated treatment well Patient left: in chair;with call bell/phone within reach;with chair alarm set   PT Visit Diagnosis: Difficulty in walking, not elsewhere classified (R26.2);Unsteadiness on feet (R26.81)    Time: 7062-3762 PT Time Calculation (min) (ACUTE ONLY): 23 min   Charges:   PT Evaluation $PT Eval Low Complexity: 1 Low PT Treatments $Gait Training: 8-22 mins      Jannette Spanner PT, DPT Physical Therapist Acute Rehabilitation Services Preferred contact method: Secure Chat Weekend Pager Only: [phone] Office: [place] 05/10/2022, 11:43 AM

## 2022-05-10 NOTE — Progress Notes (Signed)
PROGRESS NOTE    Christy Wade  GYI:[id number] DOB: [birth year] DOA: 05/08/2022 PCP: Luetta Nutting, DO   Brief Narrative:  Christy Wade is a 86 y.o. female with medical history significant for but not limited to hypertension, hypothyroidism, history of a cataract, history of cardiac disease and history of chronic combined systolic and diastolic CHF as well as a recent fall with a spiral humeral fracture, GERD as well as other comorbidities including a history of hyponatremia who presents with epigastric and mid chest discomfort as well as dyspnea on exertion with "frequent burping".  Patient states that about a week ago she started having some reflux symptoms which progressively worsened throughout the week.  Also noticed that she is getting more more short of breath over the last week or so especially with ambulation and exertion.  States that she has had some diarrhea which is chronic for her and is "nervous".  Had a fall about a month ago and broke her right humerus and was post to see the orthopedic surgeon Dr. Doy Mince on Monday.  Patient states that she "spit up" once and continues to abdomen burning epigastric and midsternal chest pain.  States this is happened to her before in the past and it feels like it was before when the sodium pills that she was taken for prior hyponatremia caused esophageal irritation.  She denies any food getting stuck in either leg pain or swelling.  She did have some chest discomfort and shortness of breath but denies any orthopnea.  States that the cast in her right arm is causing her pain and digging into her skin.  Given her symptoms she presented to the ED for further evaluation and [facility] was asked to admit this patient for epigastric and chest discomfort with reflux symptoms as well as dyspnea on exertion.  Further work-up revealed that she was hyponatremic with a sodium of 121.   **Sodium is improving with fluid resuscitation and sodium is now 130.   Patient feels better and states that she is getting close to her baseline and chest pain has resolved.  She states that she slept well.  PT OT recommending home health PT.  SLP evaluated and feel that her symptoms of epigastric discomfort and trouble swallowing is related to the GERD.  They are recommending PPI and continue on mechanical soft diet with thin liquids.  Anticipating discharging home in the next 24 hours back to her assisted living facility with physical therapy.   Assessment and Plan: No notes have been filed under this hospital service. Service: Hospitalist  Chest pain rule out ACS, improving -Patient is having chest discomfort and dyspnea and she states that this is a little bit different than what she remembers from her prior cardiac event where she ended up having chronic combined systolic and diastolic CHF -Checking BNP and was 91.9 as below -Troponins are flat negative and initial troponin was 8 and repeat troponin was 10 -EKG showed sinus rhythm but did show a left bundle branch block -Ordered echocardiogram for further evaluation and shows an EF of 55 to 60% with indeterminate -We will continue monitor telemetry -CTA of the chest done and showed no definite evidence of pulm embolus but she did have some mild right posterior lobe scarring or subsegmental atelectasis that was noted due to an old adjacent displaced rib fracture -May need to consult cardiology for further evaluation but since her symptoms have resolved will refer to outpatient follow-up given normal EF   Epigastric discomfort  with reflux symptoms, improving -Patient states for the last week she has been having worsening reflux symptoms and states that she has "spit up" -Increase PPI from 40 mg daily to twice daily we will continue -In the ED she received famotidine p.o., sucralfate 1 g p.o. once as well as Maalox/Mylanta -CT scan of the abdomen pelvis done and showed no acute abnormality with regards to the  stomach and small bowel.  No obstruction noted. -Check Lipase Level was mildly elevated at 54 -CT scan did mention that she had sinus mild circumferential wall thickening of the urinary bladder so we will obtain a urinalysis -Patient is concerned about swallowing solid food so we will obtain SLP evaluation and may need GI evaluation for further evaluation recommendations but this is improved -Continue with PPI twice daily   Acute Hyponatremia -Possibly in the setting of her diuretics and poor p.o. intake due to reflux -Sodium was 121 on admission and improved to 129 with fluid hydration and today is now 130 -Holding her furosemide Entresto and Lasix given that she appears clinically dry and has an AKI -IV fluid hydration has now stopped but will start her on IV ceftriaxone given suspected UTI  Recent cataract surgery in the right eye -Continue home ophthalmic drops including ketorolac 1 drop right eye 4 times daily, moxifloxacin 1 drop to right eye 3 times daily, 1 drop apply Liquifilm tears 4 times daily as well as prednisolone acetate taper and cyclosporine 1 drop in both eyes twice daily -   AKI, stable -Mild.  Baseline creatinine of 0.8 and she presents with a BUN/creatinine of 10/1.25 -> 8/1.26 -> 10/1.35 today -Avoiding nephrotoxic medication and holding medications as above -She has been getting gentle IV fluid hydration and will continue normal saline at 75 MLS per hour today -Check urinalysis and urine sodium, urine creatinine, urine osmolality -Avoid further nephrotoxic medications, contrast dyes, hypotension and dehydration to ensure adequate renal perfusion -Repeat CMP in a.m.   ?  E. coli cystitis -Urinalysis done and showed clear appearance with yellow color urine, with trace hemoglobin, moderate leukocytes, positive nitrites, urine specific gravity of less than 1.005, many bacteria, 6-10 RBCs per high-power field, 21-50 WBCs -CT scan mentioned mild circumferential wall  thickening of the urinary bladder -Checking urine culture we will monitor off of antibiotics given that she is afebrile and only has a mild leukopenia but given her fragile state and symptoms patient daughter had stated that this is exactly what happened when she had a UTI previously so we will empirically treat with IV ceftriaxone   Dyspnea on exertion, improved -Check COVID and influenza A and influenza B via PCR -CTA of the chest showed no evidence of PE but did show mild right posterior lobe scarring or subsegmental atelectasis -Check BNP and echocardiogram given her history of heart failure but she appears clinically dry currently; BNP was 91.9 -Echocardiogram done and shows "Left Ventricle: Left ventricular ejection fraction, by estimation, is 55 to 60%. The left ventricle has normal function. The left ventricle has no regional wall motion abnormalities. The left ventricular internal cavity  size was normal in size. There is   no left ventricular hypertrophy. Left ventricular diastolic parameters  are indeterminate. " -Start flutter valve and incentive spirometry -Repeat chest x-ray in a.m. -Awaiting PT OT evaluation I will do an ambulatory home O2 screen prior to discharge   Right humeral spiral fracture -Has a hard cast on which is causing discomfort -We will need to have [facility] adjust -  Supposed to see Dr. Doy Mince in orthopedic surgery on Monday and if she is improved she can be discharged tomorrow morning and follow-up with them directly after hospitalization however she is still awaiting a PT OT evaluation so we will need to have her reschedule and call Dr. Gordy Savers in the morning to see if she can come sometime this week -May need to discuss with orthopedic surgery while she is here -Continue with pain control with p.o. tramadol 25 mg and she states that she feels much better with the tramadol   Leukopenia -Mild and WBC 3.4 and improved to 3.8 yesterday and is now 3.9 -Continue to  monitor trend and repeat CBC in a.m and continue to monitor for signs and symptoms of infection   Normocytic anemia -Patient's hemoglobin/hematocrit is now 11.2/33.3 -> 10.9/32.6 -> 10.2/31.3 -Check anemia panel in the a.m. -Continue to monitor for signs of bleeding; no overt bleeding noted -Repeat CBC in a.m.  DVT prophylaxis: heparin injection 5,000 Units Start: 05/08/22 2315 SCDs Start: 05/08/22 2222    Code Status: DNR Family Communication: No family currently at bedside  Disposition Plan:  Level of care: Telemetry Status is: Inpatient Remains inpatient appropriate because: Needs further clinical improvement and will defer initiation of treatment for suspected UTI and cystitis   Consultants:  None  Procedures:  As delineated as above  Antimicrobials:  Anti-infectives (From admission, onward)    None       Subjective: Seen and examined at bedside and patient doing a little bit better.  No nausea or vomiting.  States that her symptoms are improving and states her chest pain has resolved and that her epigastric pain is improved.  She is feeling okay.  Awaiting physical therapy and states that she feels weak.  No other concerns or complaints this time.  Objective: Vitals:   05/10/22 0458 05/10/22 0600 05/10/22 1313 05/10/22 2102  BP: (!) 129/54  109/60 125/74  Pulse: 79  88 91  Resp: '14  16 16  '$ Temp: 98.5 F (36.9 C)  97.9 F (36.6 C) 99 F (37.2 C)  TempSrc: Oral  Oral Oral  SpO2: 97%  98% 98%  Weight:  65 kg    Height:        Intake/Output Summary (Last 24 hours) at 05/10/2022 2123 Last data filed at 05/10/2022 2106 Gross per 24 hour  Intake 300 ml  Output 750 ml  Net -450 ml   Filed Weights   05/08/22 1237 05/10/22 0600  Weight: 68 kg 65 kg   Examination: Physical Exam:  Constitutional: WN/WD elderly chronically ill-appearing Caucasian female currently no acute distress appears calm Respiratory: Diminished to auscultation bilaterally, no wheezing,  rales, rhonchi or crackles. Normal respiratory effort and patient is not tachypenic. No accessory muscle use.  Unlabored breathing Cardiovascular: RRR, no murmurs / rubs / gallops. S1 and S2 auscultated.  Minimal extremity edema Abdomen: Soft, non-tender, non-distended. Bowel sounds positive.  GU: Deferred. Musculoskeletal: No clubbing / cyanosis of digits/nails.  Right shoulder is in a hard shell cast Skin: No rashes, lesions, ulcers on limited skin evaluation. No induration; Warm and dry.  Neurologic: CN 2-12 grossly intact with no focal deficits. Romberg sign and cerebellar reflexes not assessed.  Psychiatric: Normal judgment and insight. Alert and oriented x 3. Normal mood and appropriate affect.   Data Reviewed: I have personally reviewed following labs and imaging studies  CBC: Recent Labs  Lab 05/08/22 1402 05/09/22 0634 05/10/22 0527  WBC 3.4* 3.8* 3.9*  NEUTROABS 2.1 2.0  2.1  HGB 11.2* 10.9* 10.2*  HCT 33.3* 32.6* 31.3*  MCV 89.5 90.8 92.1  PLT 193 175 466   Basic Metabolic Panel: Recent Labs  Lab 05/08/22 1402 05/09/22 0634 05/09/22 1713 05/10/22 0527  NA 121* 129* 130* 130*  K 3.8 3.9 3.9 3.5  CL 87* 99 99 101  CO2 26 20* 24 21*  GLUCOSE 90 74 82 84  BUN '10 8 10 10  '$ CREATININE 1.25* 1.26* 1.30* 1.35*  CALCIUM 8.3* 8.1* 8.1* 7.7*  MG  --  2.3  --  1.9  PHOS  --  3.4  --  3.0   GFR: Estimated Creatinine Clearance: 32.4 mL/min (A) (by C-G formula based on SCr of 1.35 mg/dL (H)). Liver Function Tests: Recent Labs  Lab 05/08/22 1402 05/09/22 0634 05/10/22 0527  AST '27 22 19  '$ ALT '14 13 13  '$ ALKPHOS 88 81 73  BILITOT 1.3* 1.0 0.9  PROT 6.6 5.8* 5.5*  ALBUMIN 3.5 3.1* 2.8*   Recent Labs  Lab 05/08/22 1402 05/08/22 2249  LIPASE 45 54*   No results for input(s): "AMMONIA" in the last 168 hours. Coagulation Profile: No results for input(s): "INR", "PROTIME" in the last 168 hours. Cardiac Enzymes: No results for input(s): "CKTOTAL", "CKMB",  "CKMBINDEX", "TROPONINI" in the last 168 hours. BNP (last 3 results) No results for input(s): "PROBNP" in the last 8760 hours. HbA1C: Recent Labs    05/08/22 2249  HGBA1C 4.9   CBG: No results for input(s): "GLUCAP" in the last 168 hours. Lipid Profile: No results for input(s): "CHOL", "HDL", "LDLCALC", "TRIG", "CHOLHDL", "LDLDIRECT" in the last 72 hours. Thyroid Function Tests: Recent Labs    05/08/22 2249  TSH 2.689   Anemia Panel: No results for input(s): "VITAMINB12", "FOLATE", "FERRITIN", "TIBC", "IRON", "RETICCTPCT" in the last 72 hours. Sepsis Labs: No results for input(s): "PROCALCITON", "LATICACIDVEN" in the last 168 hours.  Recent Results (from the past 240 hour(s))  Resp Panel by RT-PCR (Flu A&B, Covid) Anterior Nasal Swab     Status: None   Collection Time: 05/08/22  5:58 PM   Specimen: Anterior Nasal Swab  Result Value Ref Range Status   SARS Coronavirus 2 by RT PCR NEGATIVE NEGATIVE Final    Comment: (NOTE) SARS-CoV-2 target nucleic acids are NOT DETECTED.  The SARS-CoV-2 RNA is generally detectable in upper respiratory specimens during the acute phase of infection. The lowest concentration of SARS-CoV-2 viral copies this assay can detect is 138 copies/mL. A negative result does not preclude SARS-Cov-2 infection and should not be used as the sole basis for treatment or other patient management decisions. A negative result may occur with  improper specimen collection/handling, submission of specimen other than nasopharyngeal swab, presence of viral mutation(s) within the areas targeted by this assay, and inadequate number of viral copies(<138 copies/mL). A negative result must be combined with clinical observations, patient history, and epidemiological information. The expected result is Negative.  Fact Sheet for Patients:  EntrepreneurPulse.com.au  Fact Sheet for Healthcare Providers:  IncredibleEmployment.be  This  test is no t yet approved or cleared by the [place] FDA and  has been authorized for detection and/or diagnosis of SARS-CoV-2 by FDA under an Emergency Use Authorization (EUA). This EUA will remain  in effect (meaning this test can be used) for the duration of the COVID-19 declaration under Section 564(b)(1) of the Act, 21 U.S.C.section 360bbb-3(b)(1), unless the authorization is terminated  or revoked sooner.       Influenza A by PCR NEGATIVE NEGATIVE  Final   Influenza B by PCR NEGATIVE NEGATIVE Final    Comment: (NOTE) The Xpert Xpress SARS-CoV-2/FLU/RSV plus assay is intended as an aid in the diagnosis of influenza from Nasopharyngeal swab specimens and should not be used as a sole basis for treatment. Nasal washings and aspirates are unacceptable for Xpert Xpress SARS-CoV-2/FLU/RSV testing.  Fact Sheet for Patients: EntrepreneurPulse.com.au  Fact Sheet for Healthcare Providers: IncredibleEmployment.be  This test is not yet approved or cleared by the [place] FDA and has been authorized for detection and/or diagnosis of SARS-CoV-2 by FDA under an Emergency Use Authorization (EUA). This EUA will remain in effect (meaning this test can be used) for the duration of the COVID-19 declaration under Section 564(b)(1) of the Act, 21 U.S.C. section 360bbb-3(b)(1), unless the authorization is terminated or revoked.  Performed at [facility], [place] [address] [place], [place] [address]   Urine Culture     Status: Abnormal (Preliminary result)   Collection Time: 05/09/22  2:03 PM   Specimen: Urine, Suprapubic  Result Value Ref Range Status   Specimen Description   Final    URINE, SUPRAPUBIC Performed at [place] [address] [place], [place] [address]    Special Requests   Final    NONE Performed at [facility], [place] [address] [place],  [address]    Culture  >=100,000 COLONIES/mL ESCHERICHIA COLI (A)  Final   Report Status PENDING  Incomplete    Radiology Studies: ECHOCARDIOGRAM COMPLETE  Result Date: 05/09/2022    ECHOCARDIOGRAM REPORT   Patient Name:   SYLVANA BONK Date of Exam: 05/09/2022 Medical Rec #:  [id number]       Height:       69.0 in Accession #:    [id number]      Weight:       150.0 lb Date of Birth:  May 19, 1936      BSA:          1.828 m Patient Age:    49 years        BP:           137/67 mmHg Patient Gender: F               HR:           85 bpm. Exam Location:  Inpatient Procedure: 2D Echo Indications:    Dyspnea  History:        Patient has prior history of Echocardiogram examinations, most                 recent 06/12/2020. Risk Factors:Hypertension.  Sonographer:    Harvie Junior Referring Phys: [id number] Annalis Kaczmarczyk LATIF [place]  1. Left ventricular ejection fraction, by estimation, is 55 to 60%. The left ventricle has normal function. The left ventricle has no regional wall motion abnormalities. Left ventricular diastolic parameters are indeterminate.  2. Right ventricular systolic function is normal. The right ventricular size is normal. There is normal pulmonary artery systolic pressure.  3. The mitral valve is normal in structure. Mild mitral valve regurgitation. No evidence of mitral stenosis.  4. The tricuspid valve is abnormal.  5. The aortic valve was not well visualized. There is mild calcification of the aortic valve. There is mild thickening of the aortic valve. Aortic valve regurgitation is mild. No aortic stenosis is present.  6. The inferior vena cava is normal in size with <50% respiratory variability, suggesting right atrial pressure of 8 mmHg. FINDINGS  Left Ventricle: Left ventricular ejection fraction, by estimation, is 55 to 60%. The left ventricle has normal function. The left ventricle has no regional wall motion abnormalities. The left ventricular internal cavity size was normal in size. There is  no left  ventricular hypertrophy. Left ventricular diastolic parameters are indeterminate. Right Ventricle: The right ventricular size is normal. Right vetricular wall thickness was not well visualized. Right ventricular systolic function is normal. There is normal pulmonary artery systolic pressure. The tricuspid regurgitant velocity is 2.50 m/s, and with an assumed right atrial pressure of 3 mmHg, the estimated right ventricular systolic pressure is 45.4 mmHg. Left Atrium: Left atrial size was normal in size. Right Atrium: Right atrial size was normal in size. Pericardium: There is no evidence of pericardial effusion. Mitral Valve: The mitral valve is normal in structure. Mild mitral valve regurgitation. No evidence of mitral valve stenosis. Tricuspid Valve: The tricuspid valve is abnormal. Tricuspid valve regurgitation is mild . No evidence of tricuspid stenosis. Aortic Valve: The aortic valve was not well visualized. There is mild calcification of the aortic valve. There is mild thickening of the aortic valve. There is mild aortic valve annular calcification. Aortic valve regurgitation is mild. Aortic regurgitation PHT measures 511 msec. No aortic stenosis is present. Aortic valve mean gradient measures 7.0 mmHg. Aortic valve peak gradient measures 13.8 mmHg. Aortic valve area, by VTI measures 2.54 cm. Pulmonic Valve: The pulmonic valve was not well visualized. Pulmonic valve regurgitation is not visualized. No evidence of pulmonic stenosis. Aorta: The aortic root is normal in size and structure. Venous: The inferior vena cava is normal in size with less than 50% respiratory variability, suggesting right atrial pressure of 8 mmHg. IAS/Shunts: No atrial level shunt detected by color flow Doppler.  LEFT VENTRICLE PLAX 2D LVIDd:         3.70 cm     Diastology LVIDs:         3.00 cm     LV e' medial:    5.77 cm/s LV PW:         1.00 cm     LV E/e' medial:  16.5 LV IVS:        1.00 cm     LV e' lateral:   10.20 cm/s LVOT  diam:     2.00 cm     LV E/e' lateral: 9.3 LV SV:         73 LV SV Index:   40 LVOT Area:     3.14 cm  LV Volumes (MOD) LV vol d, MOD A2C: 82.6 ml LV vol d, MOD A4C: 76.1 ml LV vol s, MOD A2C: 46.5 ml LV vol s, MOD A4C: 37.2 ml LV SV MOD A2C:     36.1 ml LV SV MOD A4C:     76.1 ml LV SV MOD BP:      41.4 ml RIGHT VENTRICLE RV Basal diam:  4.40 cm RV Mid diam:    3.30 cm RV S prime:     15.40 cm/s TAPSE (M-mode): 2.7 cm LEFT ATRIUM             Index        RIGHT ATRIUM           Index LA diam:        3.20 cm 1.75 cm/m   RA Area:     14.90 cm LA Vol (A2C):   62.6 ml 34.24 ml/m  RA Volume:   44.40 ml  24.29 ml/m LA  Vol (A4C):   49.2 ml 26.91 ml/m LA Biplane Vol: 56.0 ml 30.63 ml/m  AORTIC VALVE                     PULMONIC VALVE AV Area (Vmax):    1.88 cm      PV Vmax:       1.26 m/s AV Area (Vmean):   2.17 cm      PV Peak grad:  6.4 mmHg AV Area (VTI):     2.54 cm AV Vmax:           185.50 cm/s AV Vmean:          118.000 cm/s AV VTI:            0.286 m AV Peak Grad:      13.8 mmHg AV Mean Grad:      7.0 mmHg LVOT Vmax:         111.00 cm/s LVOT Vmean:        81.400 cm/s LVOT VTI:          0.231 m LVOT/AV VTI ratio: 0.81 AI PHT:            511 msec  AORTA Ao Root diam: 3.30 cm Ao Asc diam:  2.50 cm MITRAL VALVE                TRICUSPID VALVE MV Area (PHT): 3.50 cm     TR Peak grad:   25.0 mmHg MV Decel Time: 217 msec     TR Vmax:        250.00 cm/s MR Peak grad: 85.9 mmHg MR Vmax:      463.33 cm/s   SHUNTS MV E velocity: 95.10 cm/s   Systemic VTI:  0.23 m MV A velocity: 116.00 cm/s  Systemic Diam: 2.00 cm MV E/A ratio:  0.82 Carlyle Dolly MD Electronically signed by Carlyle Dolly MD Signature Date/Time: 05/09/2022/1:51:18 PM    Final     Scheduled Meds:  (feeding supplement) PROSource Plus  30 mL Oral Daily   calcium carbonate  1 tablet Oral TID   calcium-vitamin D  1 tablet Oral Q breakfast   cycloSPORINE  1 drop Both Eyes BID   heparin  5,000 Units Subcutaneous Q8H   ketorolac  1 drop Right Eye  QID   [START ON 05/11/2022] levothyroxine  75 mcg Oral Q0600   moxifloxacin  1 drop Right Eye TID   pantoprazole  40 mg Oral BID   polyvinyl alcohol  1 drop Right Eye QID   potassium chloride  20 mEq Oral Daily   prednisoLONE acetate  1 drop Right Eye QID   Followed by   Derrill Memo ON 05/13/2022] prednisoLONE acetate  1 drop Right Eye TID   Followed by   Derrill Memo ON 05/20/2022] prednisoLONE acetate  1 drop Right Eye BID   Followed by   Derrill Memo ON 05/27/2022] prednisoLONE acetate  1 drop Right Eye Daily   sacubitril-valsartan  1 tablet Oral BID   simethicone  120 mg Oral QID   traMADol  50 mg Oral Q12H   Continuous Infusions:   LOS: 1 day   Raiford Noble, DO [facility] Available via Epic secure chat 7am-7pm After these hours, please refer to coverage provider listed on [email or web address] 05/10/2022, 9:23 PM

## 2022-05-10 NOTE — Evaluation (Addendum)
Clinical/Bedside Swallow Evaluation Patient Details  Name: Christy Wade MRN: [id number] Date of Birth: [birth year]  Today's Date: 05/10/2022 Time: SLP Start Time (ACUTE ONLY): 43 SLP Stop Time (ACUTE ONLY): 1000 SLP Time Calculation (min) (ACUTE ONLY): 25 min  Past Medical History:  Past Medical History:  Diagnosis Date   Anemia    on iron now   Cataract    Hypertension    Hypothyroidism    Osteoporosis    PONV (postoperative nausea and vomiting)    Thyroid disease    Varicose vein    Past Surgical History:  Past Surgical History:  Procedure Laterality Date   ABDOMINAL HYSTERECTOMY  1975   partial   APPENDECTOMY  1975   with hysterectomy   HEMORROIDECTOMY     [id number]   LAPAROSCOPIC SMALL BOWEL RESECTION N/A 11/29/2014   Procedure: LAPAROSCOPIC SMALL INTESTINE RESECTION;  Surgeon: Michael Boston, MD;  Location: [facility];  Service: General;  Laterality: N/A;   VARICOSE VEIN SURGERY Left 1971   HPI:  Patient is an 86 y.o. female with PMH: GERD, HTN, hypothyroidism, cardiac disease, chronic combined systolic and diastolic CHF as well as recent fall with a spiral humeral fracture. She presented to the hospital on 05/08/22 from facility [facility]) with epigastric and mid-chest discomfort, dyspnea on exertion and frequent burping. Patient had reported that she had similar symptoms in the past which she attributed to when she had to take sodium pills, which caused esophageal irritation. During work-up in ED, it was discovered that she was hyponatremic with sodium of 121. She was started on 40 mg Pantoprazole.    Assessment / Plan / Recommendation  Clinical Impression  Patient presents with suspected primary esophageal dysphagia as per this bedside/clinical swallow evaluation as well as review of current and recent past medical history. Patient told SLP that she is aware of GERD as her daughter has it and takes medication (Tagamet) daily and that patient herself had cut out  some GERD triggers such as tomato based foods, orange juice, etc. She reported that her symptoms began while she was at her facility and she felt like she couldnt breathe and was having discomfort in her chest and was panicing but the nurses checked her vitals which were WNL. SLP observed patient while she ate some of her breakfast and aside from very mild right anterior spillage, she did not exhibit any overt s/s aspiration or penetration. She did exhibit some delayed belching which is consistent with GERD diagnosis. SLP recommending patient continue on current PPI as prescribed by MD but no further skilled SLP intervention recommended. SLP Visit Diagnosis: Dysphagia, pharyngoesophageal phase (R13.14)    Aspiration Risk  Mild aspiration risk    Diet Recommendation Thin liquid;Dys 3(mechanical soft)  Liquid Administration via: Cup;Straw Medication Administration: Whole meds with puree Supervision: Patient able to self feed Compensations: Slow rate;Small sips/bites Postural Changes: Seated upright at 90 degrees;Remain upright for at least 30 minutes after po intake    Other  Recommendations Oral Care Recommendations: Oral care BID    Recommendations for follow up therapy are one component of a multi-disciplinary discharge planning process, led by the attending physician.  Recommendations may be updated based on patient status, additional functional criteria and insurance authorization.  Follow up Recommendations No SLP follow up      Assistance Recommended at Discharge None  Functional Status Assessment Patient has had a recent decline in their functional status and demonstrates the ability to make significant improvements in function in a reasonable and  predictable amount of time.  Frequency and Duration   N/A         Prognosis   N/A     Swallow Study   General Date of Onset: 05/08/22 HPI: Patient is an 86 y.o. female with PMH: GERD, HTN, hypothyroidism, cardiac disease, chronic  combined systolic and diastolic CHF as well as recent fall with a spiral humeral fracture. She presented to the hospital on 05/08/22 from facility [facility]) with epigastric and mid-chest discomfort, dyspnea on exertion and frequent burping. Patient had reported that she had similar symptoms in the past which she attributed to when she had to take sodium pills, which caused esophageal irritation. During work-up in ED, it was discovered that she was hyponatremic with sodium of 121. She was started on 40 mg Pantoprazole. Type of Study: Bedside Swallow Evaluation Previous Swallow Assessment: none found Diet Prior to this Study: Regular;Thin liquids Temperature Spikes Noted: No Respiratory Status: Room air History of Recent Intubation: No Behavior/Cognition: Alert;Cooperative;Pleasant mood Oral Cavity Assessment: Within Functional Limits Oral Care Completed by SLP: No Oral Cavity - Dentition: Missing dentition Vision: Functional for self-feeding Self-Feeding Abilities: Able to feed self Patient Positioning: Upright in bed Baseline Vocal Quality: Normal Volitional Cough: Strong Volitional Swallow: Able to elicit    Oral/Motor/Sensory Function Overall Oral Motor/Sensory Function: Mild impairment Facial ROM: Within Functional Limits Facial Symmetry: Abnormal symmetry right Facial Strength: Reduced right Facial Sensation: Within Functional Limits Lingual ROM: Within Functional Limits Lingual Symmetry: Within Functional Limits Lingual Strength: Within Functional Limits Lingual Sensation: Within Functional Limits Velum: Within Functional Limits Mandible: Within Functional Limits   Ice Chips     Thin Liquid Thin Liquid: Within functional limits Presentation: Straw;Self Fed    Nectar Thick     Honey Thick     Puree Puree: Impaired Presentation: Spoon;Self Fed Oral Phase Functional Implications: Right anterior spillage   Solid     Solid: Within functional limits Presentation: [place], MA, CCC-SLP Speech Therapy

## 2022-05-11 DIAGNOSIS — E871 Hypo-osmolality and hyponatremia: Secondary | ICD-10-CM | POA: Diagnosis not present

## 2022-05-11 DIAGNOSIS — N390 Urinary tract infection, site not specified: Secondary | ICD-10-CM

## 2022-05-11 DIAGNOSIS — R079 Chest pain, unspecified: Secondary | ICD-10-CM | POA: Diagnosis not present

## 2022-05-11 DIAGNOSIS — B9620 Unspecified Escherichia coli [E. coli] as the cause of diseases classified elsewhere: Secondary | ICD-10-CM

## 2022-05-11 DIAGNOSIS — R0602 Shortness of breath: Secondary | ICD-10-CM | POA: Diagnosis not present

## 2022-05-11 DIAGNOSIS — R1013 Epigastric pain: Secondary | ICD-10-CM | POA: Diagnosis not present

## 2022-05-11 LAB — CBC WITH DIFFERENTIAL/PLATELET
Abs Immature Granulocytes: 0.02 10*3/uL (ref 0.00–0.07)
Basophils Absolute: 0 10*3/uL (ref 0.0–0.1)
Basophils Relative: 0 %
Eosinophils Absolute: 0.2 10*3/uL (ref 0.0–0.5)
Eosinophils Relative: 4 %
HCT: 29.7 % — ABNORMAL LOW (ref 36.0–46.0)
Hemoglobin: 9.9 g/dL — ABNORMAL LOW (ref 12.0–15.0)
Immature Granulocytes: 1 %
Lymphocytes Relative: 30 %
Lymphs Abs: 1.3 10*3/uL (ref 0.7–4.0)
MCH: 30.7 pg (ref 26.0–34.0)
MCHC: 33.3 g/dL (ref 30.0–36.0)
MCV: 92.2 fL (ref 80.0–100.0)
Monocytes Absolute: 0.5 10*3/uL (ref 0.1–1.0)
Monocytes Relative: 12 %
Neutro Abs: 2.3 10*3/uL (ref 1.7–7.7)
Neutrophils Relative %: 53 %
Platelets: 151 10*3/uL (ref 150–400)
RBC: 3.22 MIL/uL — ABNORMAL LOW (ref 3.87–5.11)
RDW: 16 % — ABNORMAL HIGH (ref 11.5–15.5)
WBC: 4.3 10*3/uL (ref 4.0–10.5)
nRBC: 0 % (ref 0.0–0.2)

## 2022-05-11 LAB — COMPREHENSIVE METABOLIC PANEL
ALT: 12 U/L (ref 0–44)
AST: 16 U/L (ref 15–41)
Albumin: 2.6 g/dL — ABNORMAL LOW (ref 3.5–5.0)
Alkaline Phosphatase: 64 U/L (ref 38–126)
Anion gap: 7 (ref 5–15)
BUN: 13 mg/dL (ref 8–23)
CO2: 20 mmol/L — ABNORMAL LOW (ref 22–32)
Calcium: 7.5 mg/dL — ABNORMAL LOW (ref 8.9–10.3)
Chloride: 101 mmol/L (ref 98–111)
Creatinine, Ser: 1.57 mg/dL — ABNORMAL HIGH (ref 0.44–1.00)
GFR, Estimated: 33 mL/min — ABNORMAL LOW (ref >60)
Glucose, Bld: 86 mg/dL (ref 70–99)
Potassium: 3.4 mmol/L — ABNORMAL LOW (ref 3.5–5.1)
Sodium: 128 mmol/L — ABNORMAL LOW (ref 135–145)
Total Bilirubin: 0.7 mg/dL (ref 0.3–1.2)
Total Protein: 5 g/dL — ABNORMAL LOW (ref 6.5–8.1)

## 2022-05-11 LAB — URINE CULTURE: Culture: >=100000 — AB

## 2022-05-11 LAB — FOLATE: Folate: 7.2 ng/mL (ref >5.9)

## 2022-05-11 LAB — IRON AND TIBC
Iron: 24 ug/dL — ABNORMAL LOW (ref 28–170)
Saturation Ratios: 11 % (ref 10.4–31.8)
TIBC: 217 ug/dL — ABNORMAL LOW (ref 250–450)
UIBC: 193 ug/dL

## 2022-05-11 LAB — RETICULOCYTES
Immature Retic Fract: 6.5 % (ref 2.3–15.9)
RBC.: 3.19 MIL/uL — ABNORMAL LOW (ref 3.87–5.11)
Retic Count, Absolute: 52.3 10*3/uL (ref 19.0–186.0)
Retic Ct Pct: 1.6 % (ref 0.4–3.1)

## 2022-05-11 LAB — PHOSPHORUS: Phosphorus: 3 mg/dL (ref 2.5–4.6)

## 2022-05-11 LAB — FERRITIN: Ferritin: 64 ng/mL (ref 11–307)

## 2022-05-11 LAB — MAGNESIUM: Magnesium: 1.9 mg/dL (ref 1.7–2.4)

## 2022-05-11 LAB — VITAMIN B12: Vitamin B-12: 304 pg/mL (ref 180–914)

## 2022-05-11 MED ORDER — LIP MEDEX EX OINT
1.0000 | TOPICAL_OINTMENT | CUTANEOUS | Status: DC | PRN
  Filled 2022-05-11: qty 7

## 2022-05-11 MED ORDER — POTASSIUM CHLORIDE CRYS ER 20 MEQ PO TBCR
40.0000 meq | EXTENDED_RELEASE_TABLET | Freq: Two times a day (BID) | ORAL | Status: AC
  Administered 2022-05-11 (×2): 40 meq via ORAL
  Filled 2022-05-11 (×2): qty 2

## 2022-05-11 NOTE — TOC Initial Note (Addendum)
Transition of Care [facility]) - Initial/Assessment Note    Patient Details  Name: Christy Wade MRN: [id number] Date of Birth: [birth year]  Transition of [facility]) CM/SW Contact:    Angelita Ingles, RN Phone Number:[phone]  05/11/2022, 10:58 AM  Clinical Narrative:                 TOC following patient admitted from [facility] in [place]. CM spokw with patient to confirm that patient aws been living in this ALF for 1.5 years. CM called facility and spoke with Nira Conn who confirms that patient is from this facility and will be able to return when medically cleared by MD. Message has been left for Health and Wellness director Wyatt Portela  to return call to CM to discuss disposition. TOC will continue to follow.   1300 Cm received call from [facility] and Doctor, general practice at [place]. Patient is ok to return when medically cleared.        Patient Goals and CMS Choice        Expected Discharge Plan and Services                                                Prior Living Arrangements/Services                       Activities of Daily Living Home Assistive Devices/Equipment: Eyeglasses, Wheelchair ADL Screening (condition at time of admission) Patient's cognitive ability adequate to safely complete daily activities?: Yes Is the patient deaf or have difficulty hearing?: No Does the patient have difficulty seeing, even when wearing glasses/contacts?: No Does the patient have difficulty concentrating, remembering, or making decisions?: No Patient able to express need for assistance with ADLs?: Yes Does the patient have difficulty dressing or bathing?: Yes Independently performs ADLs?: No Dressing (OT): Needs assistance Is this a change from baseline?: Pre-admission baseline Grooming: Needs assistance Is this a change from baseline?: Pre-admission baseline Feeding: Needs assistance Is this a change from baseline?:  Pre-admission baseline Bathing: Needs assistance Is this a change from baseline?: Pre-admission baseline Toileting: Needs assistance Is this a change from baseline?: Pre-admission baseline In/Out Bed: Needs assistance Is this a change from baseline?: Pre-admission baseline Walks in Home: Needs assistance Is this a change from baseline?: Pre-admission baseline Does the patient have difficulty walking or climbing stairs?: Yes Weakness of Legs: Both Weakness of Arms/Hands: Right  Permission Sought/Granted                  Emotional Assessment              Admission diagnosis:  Hyponatremia [E87.1] Patient Active Problem List   Diagnosis Date Noted   SOB (shortness of breath)    Atrial tachycardia    Acute combined systolic and diastolic heart failure (HCC)    DCM (dilated cardiomyopathy) ([place])    Hallucinations 06/10/2020   Sepsis ([place]) 06/10/2020   Hypoxia 06/10/2020   Protein-calorie malnutrition ([place]) 06/02/2020   Recurrent UTI 05/27/2020   GAD (generalized anxiety disorder) 05/14/2020   Hypokalemia 05/14/2020   Right shoulder pain 05/14/2020   Loose stools 05/09/2020   Dysphagia 05/09/2020   Hemorrhoids 05/09/2020   Fatigue 05/05/2020   CHF (congestive heart failure) ([place]) 03/25/2020   Respiratory failure ([place]) 03/25/2020   Multiple fractures of ribs, right side, subsequent  encounter for fracture with routine healing 03/06/2020   Hyponatremia 03/05/2020   Diarrhea 07/24/2018   Vaginitis and vulvovaginitis 05/22/2016   Well adult exam 10/01/2015   Chronic chest wall pain 09/09/2014   Mitral valve regurgitation 06/21/2012   Essential hypertension 01/03/2008   Osteopenia 03/28/2007   Hypothyroidism 02/16/2007   PCP:  Luetta Nutting, DO Pharmacy:   [place], [place] - [address] [place] [address] Phone: [phone] Fax: [phone]     Social Determinants of Health  (SDOH) Interventions    Readmission Risk Interventions    05/11/2022   10:58 AM  Readmission Risk Prevention Plan  Transportation Screening Complete  PCP or Specialist Appt within 5-7 Days Complete  Home Care Screening Complete  Medication Review (RN CM) Complete

## 2022-05-11 NOTE — Progress Notes (Signed)
   05/11/22 1416  Assess: MEWS Score  Temp 98.5 F (36.9 C)  BP 112/75  MAP (mmHg) 86  Pulse Rate (!) 112  Resp 18  SpO2 98 %  O2 Device Room Air  Assess: MEWS Score  MEWS Temp 0  MEWS Systolic 0  MEWS Pulse 2  MEWS RR 0  MEWS LOC 0  MEWS Score 2  MEWS Score Color Yellow  Assess: if the MEWS score is Yellow or Red  Were vital signs taken at a resting state? Yes  Focused Assessment No change from prior assessment  Does the patient meet 2 or more of the SIRS criteria? No  MEWS guidelines implemented *See Row Information* Yes  Treat  MEWS Interventions Other (Comment)  Take Vital Signs  Increase Vital Sign Frequency  Yellow: Q 2hr X 2 then Q 4hr X 2, if remains yellow, continue Q 4hrs  Escalate  MEWS: Escalate Yellow: discuss with charge nurse/RN and consider discussing with provider and RRT  Notify: Charge Nurse/RN  Name of Charge Nurse/RN Notified Autumn, RN  Date Charge Nurse/RN Notified 05/11/22  Time Charge Nurse/RN Notified 1421  Notify: Provider  Provider Name/Title Sheikh  Date Provider Notified 05/11/22  Time Provider Notified 1422  Method of Notification Page  Notification Reason Change in status (HR 113)  Provider response Other (Comment) (Waiting for resposne)  Document  Patient Outcome Other (Comment) (waiting for provider response)  Progress note created (see row info) Yes  Assess: SIRS CRITERIA  SIRS Temperature  0  SIRS Pulse 1  SIRS Respirations  0  SIRS WBC 1  SIRS Score Sum  2

## 2022-05-11 NOTE — Consult Note (Signed)
[facility] Renal Consultation Note  Requesting MD: Raiford Noble, DO Indication for Consultation:  hyponatremia and mild AKI  Chief complaint: chest discomfort and shortness of breath  HPI:  Christy Wade is a 86 y.o. female with a history of HTN, chronic combined systolic and diastolic CHF, osteoporosis and thyroid disease who presented to the hospital with chest discomfort and shortness of breath with activity.  She was found to have hyponatremia at 121 which improved with fluids per the consulting team but has since started to drop again.  Nephrology is consulted for assistance with management of hyponatremia as well as some mild acute kidney injury.  She has had hyponatremia in the past and this had most recently improved though there is a gap in labs since late 2021 and presentation.  In august 2021 sodium was in the low 110's at one point.  She has been on tramadol here.  She's willing to try tramadol.  We called her daughter on speakerphone together.  All questions answered.  The patient is an excellent historian.  She shows me bruises from recent falls    Creatinine, Ser  Date/Time Value Ref Range Status  05/11/2022 05:30 AM 1.57 (H) 0.44 - 1.00 mg/dL Final  05/10/2022 05:27 AM 1.35 (H) 0.44 - 1.00 mg/dL Final  05/09/2022 05:13 PM 1.30 (H) 0.44 - 1.00 mg/dL Final  05/09/2022 06:34 AM 1.26 (H) 0.44 - 1.00 mg/dL Final  05/08/2022 02:02 PM 1.25 (H) 0.44 - 1.00 mg/dL Final  06/30/2020 04:45 PM 0.99 0.57 - 1.00 mg/dL Final  06/19/2020 04:19 AM 0.84 0.44 - 1.00 mg/dL Final  06/17/2020 05:03 AM 0.86 0.44 - 1.00 mg/dL Final  06/16/2020 04:10 AM 0.95 0.44 - 1.00 mg/dL Final  06/15/2020 05:53 AM 0.82 0.44 - 1.00 mg/dL Final  06/14/2020 04:45 AM 0.79 0.44 - 1.00 mg/dL Final  06/13/2020 04:49 AM 0.97 0.44 - 1.00 mg/dL Final  06/12/2020 04:09 AM 0.88 0.44 - 1.00 mg/dL Final  06/11/2020 05:07 AM 0.81 0.44 - 1.00 mg/dL Final  06/10/2020 12:34 PM 0.86 0.44 - 1.00 mg/dL Final   06/06/2020 02:50 PM 0.99 0.44 - 1.00 mg/dL Final  04/01/2020 12:13 PM 1.08 (H) 0.44 - 1.00 mg/dL Final  03/28/2020 05:04 AM 0.88 0.44 - 1.00 mg/dL Final  03/27/2020 05:22 AM 0.73 0.44 - 1.00 mg/dL Final  03/26/2020 05:02 AM 0.84 0.44 - 1.00 mg/dL Final  03/25/2020 10:58 AM 0.75 0.44 - 1.00 mg/dL Final  03/25/2020 04:18 AM 0.70 0.44 - 1.00 mg/dL Final  03/14/2020 07:48 AM 0.86 0.44 - 1.00 mg/dL Final  03/13/2020 06:32 PM 0.91 0.44 - 1.00 mg/dL Final  03/12/2020 05:19 AM 1.01 (H) 0.44 - 1.00 mg/dL Final  03/11/2020 10:40 AM 0.85 0.44 - 1.00 mg/dL Final  03/10/2020 07:38 AM 0.73 0.44 - 1.00 mg/dL Final  03/09/2020 03:31 PM 0.91 0.44 - 1.00 mg/dL Final  03/09/2020 06:28 AM 0.74 0.44 - 1.00 mg/dL Final  03/08/2020 05:20 AM 0.86 0.44 - 1.00 mg/dL Final  03/07/2020 07:19 AM 0.90 0.44 - 1.00 mg/dL Final  03/07/2020 02:23 AM 1.05 (H) 0.44 - 1.00 mg/dL Final  03/06/2020 08:36 PM 1.13 (H) 0.44 - 1.00 mg/dL Final  03/06/2020 06:07 PM 1.09 (H) 0.44 - 1.00 mg/dL Final  03/06/2020 12:13 PM 1.21 (H) 0.44 - 1.00 mg/dL Final  03/06/2020 10:07 AM 1.24 (H) 0.44 - 1.00 mg/dL Final  03/06/2020 12:30 AM 1.46 (H) 0.44 - 1.00 mg/dL Final  03/05/2020 09:13 AM 1.26 (H) 0.44 - 1.00 mg/dL Final  11/14/2019 03:47 PM  1.12 0.40 - 1.20 mg/dL Final  02/12/2019 08:15 AM 1.10 0.40 - 1.20 mg/dL Final  01/22/2019 09:27 AM 1.25 (H) 0.40 - 1.20 mg/dL Final  01/15/2019 11:48 AM 1.15 0.40 - 1.20 mg/dL Final  10/31/2017 10:07 AM 0.90 0.40 - 1.20 mg/dL Final  10/13/2016 12:07 PM 0.86 0.40 - 1.20 mg/dL Final  10/01/2015 09:27 AM 0.96 0.40 - 1.20 mg/dL Final  11/30/2014 04:40 AM 1.11 (H) 0.44 - 1.00 mg/dL Final  11/25/2014 08:40 AM 0.93 0.44 - 1.00 mg/dL Final  09/24/2014 09:43 AM 1.07 0.40 - 1.20 mg/dL Final  09/20/2013 10:35 AM 1.0 0.4 - 1.2 mg/dL Final  06/21/2012 10:24 AM 1.0 0.4 - 1.2 mg/dL Final  04/15/2011 10:55 AM 1.0 0.4 - 1.2 mg/dL Final  03/02/2010 12:00 AM 0.9 0.4 - 1.2 mg/dL Final     PMHx:   Past Medical  History:  Diagnosis Date   Anemia    on iron now   Cataract    Hypertension    Hypothyroidism    Osteoporosis    PONV (postoperative nausea and vomiting)    Thyroid disease    Varicose vein     Past Surgical History:  Procedure Laterality Date   ABDOMINAL HYSTERECTOMY  1975   partial   APPENDECTOMY  1975   with hysterectomy   HEMORROIDECTOMY     [phone]   LAPAROSCOPIC SMALL BOWEL RESECTION N/A 11/29/2014   Procedure: LAPAROSCOPIC SMALL INTESTINE RESECTION;  Surgeon: Michael Boston, MD;  Location: [facility];  Service: General;  Laterality: N/A;   VARICOSE VEIN SURGERY Left 1971    Family Hx:  Family History  Problem Relation Age of Onset   Bone cancer Mother    Diabetes Father    Stroke Father    Breast cancer Sister    Stomach cancer Neg Hx    Colon cancer Neg Hx     Social History:  reports that she has never smoked. She has never used smokeless tobacco. She reports that she does not drink alcohol and does not use drugs.  Allergies:  Allergies  Allergen Reactions   Codeine Nausea Only   Sulfamethoxazole Rash and Other (See Comments)    Pt states "seems like I break out"    Medications: Prior to Admission medications   Medication Sig Start Date End Date Taking? Authorizing Provider  acetaminophen (TYLENOL) 500 MG tablet Take 1,000 mg by mouth every 8 (eight) hours as needed (for pain).   Yes [provider]  Calcium Carbonate-Vit D-Min (CALCIUM 600+D PLUS MINERALS PO) Take 1 tablet by mouth daily.   Yes [provider]  Cranberry 450 MG TABS Take 450 mg by mouth daily.   Yes [provider]  cycloSPORINE (RESTASIS) 0.05 % ophthalmic emulsion Place 1 drop into both eyes 2 (two) times daily. 04/24/20  Yes Ngetich, Dinah C, NP  Eyelid Cleansers (OCUSOFT LID SCRUB ORIGINAL) PADS Place 1 Pad into both eyes 2 (two) times daily.   Yes [provider]  furosemide (LASIX) 40 MG tablet TAKE 1 TABLET(40 MG) BY MOUTH DAILY Patient  taking differently: Take 40 mg by mouth daily. 07/04/20  Yes Luetta Nutting, DO  ketorolac (ACULAR) 0.4 % SOLN Place 1 drop into the right eye 4 (four) times daily. 05/04/22 06/01/22 Yes [provider]  levothyroxine (SYNTHROID) 75 MCG tablet Take 75 mcg by mouth daily before breakfast.   Yes [provider]  loperamide (IMODIUM A-D) 2 MG tablet Take 2 mg by mouth every 6 (six) hours as needed for  diarrhea or loose stools.   Yes [provider]  moxifloxacin (VIGAMOX) 0.5 % ophthalmic solution Place 1 drop into the right eye 4 (four) times daily. 05/05/22 05/19/22 Yes [provider]  nystatin (MYCOSTATIN/NYSTOP) powder Apply 1 Application topically every 8 (eight) hours as needed (for rashes or redness- affected areas).   Yes [provider]  potassium chloride 20 MEQ/15ML (10%) SOLN Take 15 mLs (20 mEq total) by mouth daily. Patient taking differently: Take 20 mEq by mouth 2 (two) times daily. 07/01/20 05/08/22 Yes Bhagat, Bhavinkumar, PA  prednisoLONE acetate (PRED FORTE) 1 % ophthalmic suspension Place 1 drop into the right eye 4 (four) times daily. Place 1 drop into the right eye four times a day for 7 days, then three times a day for 7 days, then two times a day for 7 days, then once a day for 7 days, then D/C 05/06/22 06/03/22 Yes [provider]  REFRESH RELIEVA PF 0.5-1 % SOLN Place 1 drop into both eyes 4 (four) times daily.   Yes [provider]  sacubitril-valsartan (ENTRESTO) 24-26 MG Take 1 tablet by mouth 2 (two) times daily. Please check BP prior to administration and hold if MAP is less than 65 Patient taking differently: Take 1 tablet by mouth 2 (two) times daily. 06/19/20  Yes Elie Confer, MD  simethicone (MYLICON) 425 MG chewable tablet Chew 125 mg by mouth 3 (three) times daily.   Yes [provider]  traMADol (ULTRAM) 50 MG tablet Take 50 mg by mouth 2 (two) times daily.   Yes [provider]   TUMS 500 MG chewable tablet Chew 1 tablet by mouth 3 (three) times daily.   Yes [provider]  VOLTAREN 1 % GEL Apply 2 g topically every 8 (eight) hours as needed (for pain- affected areas of shoulders, back, and right elbow).   Yes [provider]  Zinc Oxide (BAZA PROTECT MOISTURE BARRIER EX) Apply 1 application  topically every 6 (six) hours as needed (for redness- affected sites).   Yes [provider]  ALPRAZolam (XANAX) 0.25 MG tablet Take 1 tablet (0.25 mg total) by mouth at bedtime as needed for anxiety. Patient not taking: Reported on 05/08/2022 06/17/20   Caren Griffins, MD  diphenoxylate-atropine (LOMOTIL) 2.5-0.025 MG tablet Take one tablet 1-2x per day as needed for diarrhea. Patient not taking: Reported on 05/08/2022 06/02/20   Luetta Nutting, DO  hydrALAZINE (APRESOLINE) 25 MG tablet TAKE 1 TABLET(25 MG) BY MOUTH EVERY 8 HOURS Patient not taking: Reported on 05/08/2022 07/04/20   Luetta Nutting, DO  levothyroxine (SYNTHROID) 88 MCG tablet Take 1 tablet (88 mcg total) by mouth daily before breakfast. Patient not taking: Reported on 05/08/2022 05/14/20   Luetta Nutting, DO  Menthol, Topical Analgesic, (BIOFREEZE) 4 % GEL Apply 3 oz topically 3 (three) times daily as needed. Patient not taking: Reported on 05/08/2022 04/24/20   Ngetich, Dinah C, NP  metoprolol succinate (TOPROL-XL) 50 MG 24 hr tablet Take 1 tablet (50 mg total) by mouth daily. Take with or immediately following a meal. Patient not taking: Reported on 05/08/2022 06/18/20   Caren Griffins, MD  mirtazapine (REMERON) 15 MG tablet Take 0.5 tablets (7.5 mg total) by mouth at bedtime. Patient not taking: Reported on 05/08/2022 06/02/20   Luetta Nutting, DO  Nutritional Supplements (,FEEDING SUPPLEMENT, PROSOURCE PLUS) liquid Take 30 mLs by mouth daily. Patient not taking: Reported on 05/08/2022 06/19/20   Elie Confer, MD  pantoprazole (PROTONIX) 40 MG  tablet Take 1 tablet (40 mg total)  by mouth daily. Patient not taking: Reported on 05/08/2022 05/12/20   Luetta Nutting, DO  spironolactone (ALDACTONE) 25 MG tablet Take 1 tablet (25 mg total) by mouth daily. Patient not taking: Reported on 05/08/2022 07/01/20 05/08/22  Leanor Kail, PA    I have reviewed the patient's current and reported prior to admission medications.  Labs:     Latest Ref Rng & Units 05/11/2022    5:30 AM 05/10/2022    5:27 AM 05/09/2022    5:13 PM  BMP  Glucose 70 - 99 mg/dL 86  84  82   BUN 8 - 23 mg/dL '13  10  10   '$ Creatinine 0.44 - 1.00 mg/dL 1.57  1.35  1.30   Sodium 135 - 145 mmol/L 128  130  130   Potassium 3.5 - 5.1 mmol/L 3.4  3.5  3.9   Chloride 98 - 111 mmol/L 101  101  99   CO2 22 - 32 mmol/L '20  21  24   '$ Calcium 8.9 - 10.3 mg/dL 7.5  7.7  8.1     Urinalysis    Component Value Date/Time   COLORURINE YELLOW 05/09/2022 [place] 05/09/2022 0356   LABSPEC <1.005 (L) 05/09/2022 0356   PHURINE 6.0 05/09/2022 0356   GLUCOSEU NEGATIVE 05/09/2022 0356   HGBUR TRACE (A) 05/09/2022 0356   HGBUR negative 03/02/2010 0000   BILIRUBINUR NEGATIVE 05/09/2022 0356   BILIRUBINUR negative 06/02/2020 1456   BILIRUBINUR NEGATIVE 04/16/2019 1000   KETONESUR NEGATIVE 05/09/2022 0356   PROTEINUR NEGATIVE 05/09/2022 0356   UROBILINOGEN 0.2 06/02/2020 1456   UROBILINOGEN 0.2 03/02/2010 0000   NITRITE POSITIVE (A) 05/09/2022 0356   LEUKOCYTESUR MODERATE (A) 05/09/2022 0356     ROS:  Pertinent items noted in HPI and remainder of comprehensive ROS otherwise negative.  Physical Exam: Vitals:   05/11/22 1416 05/11/22 1634  BP: 112/75 136/79  Pulse: (!) 112 (!) 111  Resp: 18 18  Temp: 98.5 F (36.9 C) 98.3 F (36.8 C)  SpO2: 98% 100%     General: elderly female in chair in NAD   HEENT: NCAT Eyes: EOMI sclera anicteric Neck: supple trachea midline  Heart: S1S2 no rub Lungs: clear and unlabored on room air Abdomen: soft/nt/nd Extremities: right shoulder/arm in  brace; no edema  Skin: no rash on extremities exposed; bruising on arms from fall and blood draws  Psych normal mood and affect Neuro: alert and oriented x 3 provides hx and follows commands  Assessment/Plan:  # Hyponatremia - Improved with fluids initially then plateaued and starting to dip again.  She may have had altered intake with the UTI if she was symptomatic.  Also with hx of recent fall(s).  In the past she thinks sodium was as low as 103 - Would stop tramadol.  Stop mirtazepine (this is now off) - continue synthroid  # AKI  - mild; two recent contrasted studies when GFR was mildly reduced  - continue supportive care   # chronic combined systolic and diastolic CHF - caution with fluids - on entresto    # UTI - per primary team. On ceftriaxone   # normocytic anemia   - iron deficiency   Claudia Desanctis 05/11/2022, 5:42 PM

## 2022-05-11 NOTE — Progress Notes (Signed)
PROGRESS NOTE    GARNETT REKOWSKI  QZE:[id number] DOB: 29-Aug-1935 DOA: 05/08/2022 PCP: Luetta Nutting, DO   Brief Narrative:  Christy Wade is a 86 y.o. female with medical history significant for but not limited to hypertension, hypothyroidism, history of a cataract, history of cardiac disease and history of chronic combined systolic and diastolic CHF as well as a recent fall with a spiral humeral fracture, GERD as well as other comorbidities including a history of hyponatremia who presents with epigastric and mid chest discomfort as well as dyspnea on exertion with "frequent burping".  Patient states that about a week ago she started having some reflux symptoms which progressively worsened throughout the week.  Also noticed that she is getting more more short of breath over the last week or so especially with ambulation and exertion.  States that she has had some diarrhea which is chronic for her and is "nervous".  Had a fall about a month ago and broke her right humerus and was post to see the orthopedic surgeon Dr. Doy Mince on Monday.  Patient states that she "spit up" once and continues to abdomen burning epigastric and midsternal chest pain.  States this is happened to her before in the past and it feels like it was before when the sodium pills that she was taken for prior hyponatremia caused esophageal irritation.  She denies any food getting stuck in either leg pain or swelling.  She did have some chest discomfort and shortness of breath but denies any orthopnea.  States that the cast in her right arm is causing her pain and digging into her skin.  Given her symptoms she presented to the ED for further evaluation and [facility] hospitalist was asked to admit this patient for epigastric and chest discomfort with reflux symptoms as well as dyspnea on exertion.  Further work-up revealed that she was hyponatremic with a sodium of 121.   **Sodium is improving with fluid resuscitation and sodium is now 130.   Patient feels better and states that she is getting close to her baseline and chest pain has resolved.  She states that she slept well.  PT OT recommending home health PT.  SLP evaluated and feel that her symptoms of epigastric discomfort and trouble swallowing is related to the GERD.  They are recommending PPI and continue on mechanical soft diet with thin liquids.  We are planning on discharging the patient home however she had a worsening renal function and sodium started downtrending so nephrology was consulted.  Patient's daughter had stated earlier that her presentation a few years ago was likely consistent with a UTI.  Urinalysis was consistent with a UTI and urine culture grew out E. coli that was resistant to some species so we have initiated IV ceftriaxone.  We will need to monitor carefully and continue monitor her labs and give her at least 3 days of antibiotics for suspected UTI.  Assessment and Plan: No notes have been filed under this hospital service. Service: Hospitalist  Chest pain rule out ACS, improving -Patient is having chest discomfort and dyspnea and she states that this is a little bit different than what she remembers from her prior cardiac event where she ended up having chronic combined systolic and diastolic CHF -Checking BNP and was 91.9 as below -Troponins are flat negative and initial troponin was 8 and repeat troponin was 10 -EKG showed sinus rhythm but did show a left bundle branch block -Ordered echocardiogram for further evaluation and shows an EF  of 55 to 60% with indeterminate -We will continue monitor telemetry -CTA of the chest done and showed no definite evidence of pulm embolus but she did have some mild right posterior lobe scarring or subsegmental atelectasis that was noted due to an old adjacent displaced rib fracture -May need to consult cardiology for further evaluation but since her symptoms have resolved will refer to outpatient follow-up given normal  EF   Epigastric discomfort with reflux symptoms, improving -Patient states for the last week she has been having worsening reflux symptoms and states that she has "spit up" -Increase PPI from 40 mg daily to twice daily we will continue -In the ED she received famotidine p.o., sucralfate 1 g p.o. once as well as Maalox/Mylanta -CT scan of the abdomen pelvis done and showed no acute abnormality with regards to the stomach and small bowel.  No obstruction noted. -Check Lipase Level was mildly elevated at 54 -CT scan did mention that she had sinus mild circumferential wall thickening of the urinary bladder so we will obtain a urinalysis -Patient is concerned about swallowing solid food so we will obtain SLP evaluation and may need GI evaluation for further evaluation recommendations but this is improved -Continue with PPI twice daily   Acute Hyponatremia, worsening -Possibly in the setting of her diuretics and poor p.o. intake due to reflux -Sodium was 121 on admission and improved to 129 with fluid hydration and today is now 130 yesterday but then dropped again to 128 -Holding her furosemide Entresto and Lasix given that she appears clinically dry and has an AKI -IV fluid hydration has now stopped but will start her on IV ceftriaxone given suspected UTI -Nephrology consulted and they feel that her sodium is improved with fluids initially and then plateaued and started dipping again; they feel that she may have had altered intake with a UTI if she is symptomatic and she is also had a history of recent falls; has had a history of hyponatremia with a sodium as low as 103 -Nephrology recommending stopping tramadol and stopping mirtazapine and continue Synthroid and changing the pain regimen to acetaminophen at 1000 mg p.o. twice daily   Recent cataract surgery in the right eye -Continue home ophthalmic drops including ketorolac 1 drop right eye 4 times daily, moxifloxacin 1 drop to right eye 3 times  daily, 1 drop apply Liquifilm tears 4 times daily as well as prednisolone acetate taper and cyclosporine 1 drop in both eyes twice daily -Continue supportive care and eyedrops   AKI, stable -Mild.  Baseline creatinine of 0.8 and she presents with a BUN/creatinine of 10/1.25 -> 8/1.26 -> 10/1.35 and is now worsened to 13/1.57 -Avoiding nephrotoxic medication and holding medications as above -She has been getting gentle IV fluid hydration fluid has been stopped -Check urinalysis and urine sodium, urine creatinine, urine osmolality and this is done and urine sodium was 12, urine creatinine was 18 and urine osm has not been done for some reason -Avoid further nephrotoxic medications, contrast dyes, hypotension and dehydration to ensure adequate renal perfusion -Because of her worsening renal function nephrology has been consulted and recommending supportive care and will defer fluids to them -Repeat CMP in a.m.   E. coli UTI and cystitis likely -Urinalysis done and showed clear appearance with yellow color urine, with trace hemoglobin, moderate leukocytes, positive nitrites, urine specific gravity of less than 1.005, many bacteria, 6-10 RBCs per high-power field, 21-50 WBCs -Urine culture shows resistant E. coli but is sensitive to ceftriaxone so  we will continue -CT scan mentioned mild circumferential wall thickening of the urinary bladder -Checking urine culture we will monitor off of antibiotics given that she is afebrile and only has a mild leukopenia but given her fragile state and symptoms patient daughter had stated that this is exactly what happened when she had a UTI previously so we will empirically treat with IV ceftriaxone for at least a minimum of 3 days but can extend to 5   Dyspnea on exertion, improved -Check COVID and influenza A and influenza B via PCR -CTA of the chest showed no evidence of PE but did show mild right posterior lobe scarring or subsegmental atelectasis -Check BNP  and echocardiogram given her history of heart failure but she appears clinically dry currently; BNP was 91.9 -Echocardiogram done and shows "Left Ventricle: Left ventricular ejection fraction, by estimation, is 55 to 60%. The left ventricle has normal function. The left ventricle has no regional wall motion abnormalities. The left ventricular internal cavity size was normal in size. There is no left ventricular hypertrophy. Left ventricular diastolic parameters  are indeterminate. " -Start flutter valve and incentive spirometry -Repeat chest x-ray in a.m. -Awaiting PT OT evaluation and they are recommending home health and she will need an ambulatory home O2 screen prior to discharge  Chronic combined systolic and diastolic CHF -Appeared dry on admission so was given IV fluid hydration and is now stopped -On Entresto and repeat echocardiogram shows improved EF -Continue with Entresto and IV fluid hydration has now stopped and will need to monitor strict I's and O's and daily weights -Continue monitor for signs and symptoms of volume overload and repeat chest x-ray in a.m.   Right humeral spiral fracture -Has a hard cast on which is causing discomfort -We will need to have [facility] adjust -Supposed to see Dr. Doy Mince in orthopedic surgery on Monday and if she is improved she can be discharged tomorrow morning and follow-up with them directly after hospitalization however she is still awaiting a PT OT evaluation so we will need to have her reschedule and call Dr. Gordy Savers in the morning to see if she can come sometime this week -May need to discuss with orthopedic surgery while she is here -Continue with pain control but will discontinue tramadol per nephrology recommendations   Leukopenia -Mild and WBC went from 3.4 -> 3.8 -> 3.9 -> 4.3 -Continue to monitor trend and repeat CBC in a.m and continue to monitor for signs and symptoms of infection   Normocytic Anemia -Patient's  hemoglobin/hematocrit is now 11.2/33.3 -> 10.9/32.6 -> 10.2/31.3 and today is now 9.9/29.7 -Anemia panel was checked and showed an iron level of 24, UIBC 193, TIBC of 217, saturation was 11%, ferritin level 64, folate level 7.2, and vitamin B12 level 304 -Continue to monitor for signs of bleeding; no overt bleeding noted -Repeat CBC in a.m.   DVT prophylaxis: heparin injection 5,000 Units Start: 05/08/22 2315 SCDs Start: 05/08/22 2222    Code Status: DNR Family Communication: No family currently at bedside  Disposition Plan:  Level of care: Telemetry Status is: Inpatient Remains inpatient appropriate because: Anticipating discharging home with home health physical therapy when medically stable to be discharged   Consultants:  Nephrology  Procedures:  As delineated as above  ECHOCARDIOGRAM IMPRESSIONS     1. Left ventricular ejection fraction, by estimation, is 55 to 60%. The  left ventricle has normal function. The left ventricle has no regional  wall motion abnormalities. Left ventricular diastolic parameters are  indeterminate.   2. Right ventricular systolic function is normal. The right ventricular  size is normal. There is normal pulmonary artery systolic pressure.   3. The mitral valve is normal in structure. Mild mitral valve  regurgitation. No evidence of mitral stenosis.   4. The tricuspid valve is abnormal.   5. The aortic valve was not well visualized. There is mild calcification  of the aortic valve. There is mild thickening of the aortic valve. Aortic  valve regurgitation is mild. No aortic stenosis is present.   6. The inferior vena cava is normal in size with <50% respiratory  variability, suggesting right atrial pressure of 8 mmHg.   FINDINGS   Left Ventricle: Left ventricular ejection fraction, by estimation, is 55  to 60%. The left ventricle has normal function. The left ventricle has no  regional wall motion abnormalities. The left ventricular internal  cavity  size was normal in size. There is   no left ventricular hypertrophy. Left ventricular diastolic parameters  are indeterminate.   Right Ventricle: The right ventricular size is normal. Right vetricular  wall thickness was not well visualized. Right ventricular systolic  function is normal. There is normal pulmonary artery systolic pressure.  The tricuspid regurgitant velocity is 2.50  m/s, and with an assumed right atrial pressure of 3 mmHg, the estimated  right ventricular systolic pressure is 26.3 mmHg.   Left Atrium: Left atrial size was normal in size.   Right Atrium: Right atrial size was normal in size.   Pericardium: There is no evidence of pericardial effusion.   Mitral Valve: The mitral valve is normal in structure. Mild mitral valve  regurgitation. No evidence of mitral valve stenosis.   Tricuspid Valve: The tricuspid valve is abnormal. Tricuspid valve  regurgitation is mild . No evidence of tricuspid stenosis.   Aortic Valve: The aortic valve was not well visualized. There is mild  calcification of the aortic valve. There is mild thickening of the aortic  valve. There is mild aortic valve annular calcification. Aortic valve  regurgitation is mild. Aortic  regurgitation PHT measures 511 msec. No aortic stenosis is present. Aortic  valve mean gradient measures 7.0 mmHg. Aortic valve peak gradient measures  13.8 mmHg. Aortic valve area, by VTI measures 2.54 cm.   Pulmonic Valve: The pulmonic valve was not well visualized. Pulmonic valve  regurgitation is not visualized. No evidence of pulmonic stenosis.   Aorta: The aortic root is normal in size and structure.   Venous: The inferior vena cava is normal in size with less than 50%  respiratory variability, suggesting right atrial pressure of 8 mmHg.   IAS/Shunts: No atrial level shunt detected by color flow Doppler.      LEFT VENTRICLE  PLAX 2D  LVIDd:         3.70 cm     Diastology  LVIDs:         3.00 cm      LV e' medial:    5.77 cm/s  LV PW:         1.00 cm     LV E/e' medial:  16.5  LV IVS:        1.00 cm     LV e' lateral:   10.20 cm/s  LVOT diam:     2.00 cm     LV E/e' lateral: 9.3  LV SV:         73  LV SV Index:   40  LVOT Area:  3.14 cm     LV Volumes (MOD)  LV vol d, MOD A2C: 82.6 ml  LV vol d, MOD A4C: 76.1 ml  LV vol s, MOD A2C: 46.5 ml  LV vol s, MOD A4C: 37.2 ml  LV SV MOD A2C:     36.1 ml  LV SV MOD A4C:     76.1 ml  LV SV MOD BP:      41.4 ml   RIGHT VENTRICLE  RV Basal diam:  4.40 cm  RV Mid diam:    3.30 cm  RV S prime:     15.40 cm/s  TAPSE (M-mode): 2.7 cm   LEFT ATRIUM             Index        RIGHT ATRIUM           Index  LA diam:        3.20 cm 1.75 cm/m   RA Area:     14.90 cm  LA Vol (A2C):   62.6 ml 34.24 ml/m  RA Volume:   44.40 ml  24.29 ml/m  LA Vol (A4C):   49.2 ml 26.91 ml/m  LA Biplane Vol: 56.0 ml 30.63 ml/m   AORTIC VALVE                     PULMONIC VALVE  AV Area (Vmax):    1.88 cm      PV Vmax:       1.26 m/s  AV Area (Vmean):   2.17 cm      PV Peak grad:  6.4 mmHg  AV Area (VTI):     2.54 cm  AV Vmax:           185.50 cm/s  AV Vmean:          118.000 cm/s  AV VTI:            0.286 m  AV Peak Grad:      13.8 mmHg  AV Mean Grad:      7.0 mmHg  LVOT Vmax:         111.00 cm/s  LVOT Vmean:        81.400 cm/s  LVOT VTI:          0.231 m  LVOT/AV VTI ratio: 0.81  AI PHT:            511 msec     AORTA  Ao Root diam: 3.30 cm  Ao Asc diam:  2.50 cm   MITRAL VALVE                TRICUSPID VALVE  MV Area (PHT): 3.50 cm     TR Peak grad:   25.0 mmHg  MV Decel Time: 217 msec     TR Vmax:        250.00 cm/s  MR Peak grad: 85.9 mmHg  MR Vmax:      463.33 cm/s   SHUNTS  MV E velocity: 95.10 cm/s   Systemic VTI:  0.23 m  MV A velocity: 116.00 cm/s  Systemic Diam: 2.00 cm  MV E/A ratio:  0.82   Antimicrobials:  Anti-infectives (From admission, onward)    Start     Dose/Rate Route Frequency Ordered Stop   05/10/22 2200   cefTRIAXone (ROCEPHIN) 1 g in sodium chloride 0.9 % 100 mL IVPB        1 g 200 mL/hr over 30 Minutes Intravenous Every 24 hours 05/10/22 2137  Subjective: Seen and examined at bedside and was doing okay was still complain of some shoulder pain.  No nausea or vomiting.  Thinks her epigastric pain and chest pain is resolved improved.  Wanting to ambulate again.  No other concerns or complaints at this time but sodium dropped.  She denies any urinary discomfort or burning but thinks that she may have had some increased frequency.  Objective: Vitals:   05/11/22 0557 05/11/22 1416 05/11/22 1634 05/11/22 2033  BP: 125/72 112/75 136/79 128/68  Pulse: 83 (!) 112 (!) 111 98  Resp: '14 18 18 17  '$ Temp: 98.4 F (36.9 C) 98.5 F (36.9 C) 98.3 F (36.8 C) 98.7 F (37.1 C)  TempSrc: Oral Oral Oral Oral  SpO2: 95% 98% 100%   Weight: 73.6 kg     Height:        Intake/Output Summary (Last 24 hours) at 05/11/2022 2113 Last data filed at 05/11/2022 1826 Gross per 24 hour  Intake 340 ml  Output 650 ml  Net -310 ml   Filed Weights   05/08/22 1237 05/10/22 0600 05/11/22 0557  Weight: 68 kg 65 kg 73.6 kg   Examination: Physical Exam:  Constitutional: Thin chronically ill-appearing Caucasian female currently no acute distress Respiratory: Diminished to auscultation bilaterally with coarse breath sounds, no wheezing, rales, rhonchi or crackles. Normal respiratory effort and patient is not tachypenic. No accessory muscle use.  Unlabored breathing and not wearing supplemental oxygen nasal cannula Cardiovascular: RRR, no murmurs / rubs / gallops. S1 and S2 auscultated.  Has trace lower extremity edema Abdomen: Soft, non-tender, non-distended.  Bowel sounds positive.  GU: Deferred. Musculoskeletal: No clubbing / cyanosis of digits/nails.  Right shoulder is in a hard shell cast Skin: No rashes, lesions, ulcers on limited skin evaluation. No induration; Warm and dry.  Neurologic: CN 2-12 grossly  intact with no focal deficits. Romberg sign and cerebellar reflexes not assessed.  Psychiatric: Normal judgment and insight. Alert and oriented x 3. Normal mood and appropriate affect.   Data Reviewed: I have personally reviewed following labs and imaging studies  CBC: Recent Labs  Lab 05/08/22 1402 05/09/22 0634 05/10/22 0527 05/11/22 0530  WBC 3.4* 3.8* 3.9* 4.3  NEUTROABS 2.1 2.0 2.1 2.3  HGB 11.2* 10.9* 10.2* 9.9*  HCT 33.3* 32.6* 31.3* 29.7*  MCV 89.5 90.8 92.1 92.2  PLT 193 175 166 295   Basic Metabolic Panel: Recent Labs  Lab 05/08/22 1402 05/09/22 0634 05/09/22 1713 05/10/22 0527 05/11/22 0530  NA 121* 129* 130* 130* 128*  K 3.8 3.9 3.9 3.5 3.4*  CL 87* 99 99 101 101  CO2 26 20* 24 21* 20*  GLUCOSE 90 74 82 84 86  BUN '10 8 10 10 13  '$ CREATININE 1.25* 1.26* 1.30* 1.35* 1.57*  CALCIUM 8.3* 8.1* 8.1* 7.7* 7.5*  MG  --  2.3  --  1.9 1.9  PHOS  --  3.4  --  3.0 3.0   GFR: Estimated Creatinine Clearance: 28.4 mL/min (A) (by C-G formula based on SCr of 1.57 mg/dL (H)). Liver Function Tests: Recent Labs  Lab 05/08/22 1402 05/09/22 0634 05/10/22 0527 05/11/22 0530  AST '27 22 19 16  '$ ALT '14 13 13 12  '$ ALKPHOS 88 81 73 64  BILITOT 1.3* 1.0 0.9 0.7  PROT 6.6 5.8* 5.5* 5.0*  ALBUMIN 3.5 3.1* 2.8* 2.6*   Recent Labs  Lab 05/08/22 1402 05/08/22 2249  LIPASE 45 54*   No results for input(s): "AMMONIA" in the last 168 hours. Coagulation  Profile: No results for input(s): "INR", "PROTIME" in the last 168 hours. Cardiac Enzymes: No results for input(s): "CKTOTAL", "CKMB", "CKMBINDEX", "TROPONINI" in the last 168 hours. BNP (last 3 results) No results for input(s): "PROBNP" in the last 8760 hours. HbA1C: Recent Labs    05/08/22 2249  HGBA1C 4.9   CBG: No results for input(s): "GLUCAP" in the last 168 hours. Lipid Profile: No results for input(s): "CHOL", "HDL", "LDLCALC", "TRIG", "CHOLHDL", "LDLDIRECT" in the last 72 hours. Thyroid Function Tests: Recent  Labs    05/08/22 2249  TSH 2.689   Anemia Panel: Recent Labs    05/11/22 0530  VITAMINB12 304  FOLATE 7.2  FERRITIN 64  TIBC 217*  IRON 24*  RETICCTPCT 1.6   Sepsis Labs: No results for input(s): "PROCALCITON", "LATICACIDVEN" in the last 168 hours.  Recent Results (from the past 240 hour(s))  Resp Panel by RT-PCR (Flu A&B, Covid) Anterior Nasal Swab     Status: None   Collection Time: 05/08/22  5:58 PM   Specimen: Anterior Nasal Swab  Result Value Ref Range Status   SARS Coronavirus 2 by RT PCR NEGATIVE NEGATIVE Final    Comment: (NOTE) SARS-CoV-2 target nucleic acids are NOT DETECTED.  The SARS-CoV-2 RNA is generally detectable in upper respiratory specimens during the acute phase of infection. The lowest concentration of SARS-CoV-2 viral copies this assay can detect is 138 copies/mL. A negative result does not preclude SARS-Cov-2 infection and should not be used as the sole basis for treatment or other patient management decisions. A negative result may occur with  improper specimen collection/handling, submission of specimen other than nasopharyngeal swab, presence of viral mutation(s) within the areas targeted by this assay, and inadequate number of viral copies(<138 copies/mL). A negative result must be combined with clinical observations, patient history, and epidemiological information. The expected result is Negative.  Fact Sheet for Patients:  EntrepreneurPulse.com.au  Fact Sheet for Healthcare Providers:  IncredibleEmployment.be  This test is no t yet approved or cleared by the [place] FDA and  has been authorized for detection and/or diagnosis of SARS-CoV-2 by FDA under an Emergency Use Authorization (EUA). This EUA will remain  in effect (meaning this test can be used) for the duration of the COVID-19 declaration under Section 564(b)(1) of the Act, 21 U.S.C.section 360bbb-3(b)(1), unless the authorization is  terminated  or revoked sooner.       Influenza A by PCR NEGATIVE NEGATIVE Final   Influenza B by PCR NEGATIVE NEGATIVE Final    Comment: (NOTE) The Xpert Xpress SARS-CoV-2/FLU/RSV plus assay is intended as an aid in the diagnosis of influenza from Nasopharyngeal swab specimens and should not be used as a sole basis for treatment. Nasal washings and aspirates are unacceptable for Xpert Xpress SARS-CoV-2/FLU/RSV testing.  Fact Sheet for Patients: EntrepreneurPulse.com.au  Fact Sheet for Healthcare Providers: IncredibleEmployment.be  This test is not yet approved or cleared by the [place] FDA and has been authorized for detection and/or diagnosis of SARS-CoV-2 by FDA under an Emergency Use Authorization (EUA). This EUA will remain in effect (meaning this test can be used) for the duration of the COVID-19 declaration under Section 564(b)(1) of the Act, 21 U.S.C. section 360bbb-3(b)(1), unless the authorization is terminated or revoked.  Performed at [facility], [place] [address], [place], [place] [address]   Urine Culture     Status: Abnormal   Collection Time: 05/09/22  2:03 PM   Specimen: Urine, Suprapubic  Result Value Ref Range Status  Specimen Description   Final    URINE, SUPRAPUBIC Performed at [facility], [place] [address], [place], [place] [address]    Special Requests   Final    NONE Performed at [facility], [place] [address], [place], [place] [address]    Culture >=100,000 COLONIES/mL ESCHERICHIA COLI (A)  Final   Report Status 05/11/2022 FINAL  Final   Organism ID, Bacteria ESCHERICHIA COLI (A)  Final      Susceptibility   Escherichia coli - MIC*    AMPICILLIN >=32 RESISTANT Resistant     CEFAZOLIN >=64 RESISTANT Resistant     CEFEPIME <=0.12 SENSITIVE Sensitive     CEFTRIAXONE 1 SENSITIVE Sensitive     CIPROFLOXACIN >=4 RESISTANT Resistant     GENTAMICIN <=1  SENSITIVE Sensitive     IMIPENEM <=0.25 SENSITIVE Sensitive     NITROFURANTOIN <=16 SENSITIVE Sensitive     TRIMETH/SULFA >=320 RESISTANT Resistant     AMPICILLIN/SULBACTAM 16 INTERMEDIATE Intermediate     PIP/TAZO <=4 SENSITIVE Sensitive     * >=100,000 COLONIES/mL ESCHERICHIA COLI    Radiology Studies: No results found.  Scheduled Meds:  (feeding supplement) PROSource Plus  30 mL Oral Daily   calcium carbonate  1 tablet Oral TID   calcium-vitamin D  1 tablet Oral Q breakfast   cycloSPORINE  1 drop Both Eyes BID   heparin  5,000 Units Subcutaneous Q8H   ketorolac  1 drop Right Eye QID   levothyroxine  75 mcg Oral Q0600   moxifloxacin  1 drop Right Eye TID   pantoprazole  40 mg Oral BID   polyvinyl alcohol  1 drop Right Eye QID   potassium chloride  20 mEq Oral Daily   potassium chloride  40 mEq Oral BID   prednisoLONE acetate  1 drop Right Eye QID   Followed by   Derrill Memo ON 05/13/2022] prednisoLONE acetate  1 drop Right Eye TID   Followed by   Derrill Memo ON 05/20/2022] prednisoLONE acetate  1 drop Right Eye BID   Followed by   Derrill Memo ON 05/27/2022] prednisoLONE acetate  1 drop Right Eye Daily   sacubitril-valsartan  1 tablet Oral BID   simethicone  120 mg Oral QID   Continuous Infusions:  cefTRIAXone (ROCEPHIN)  IV Stopped (05/10/22 2236)    LOS: 2 days   Raiford Noble, DO [facility] Available via Epic secure chat 7am-7pm After these hours, please refer to coverage provider listed on [email or web address] 05/11/2022, 9:13 PM

## 2022-05-11 NOTE — Progress Notes (Signed)
Mobility Specialist - Progress Note   05/11/22 1501  Mobility  Activity Ambulated with assistance in hallway  Level of Assistance Contact guard assist, steadying assist  Assistive Device None (none due to R arm brace)  Distance Ambulated (ft) 250 ft  RUE Weight Bearing NWB  Mobility Referral Yes  $Mobility charge 1 Mobility   Pt received in bed and agreeable to mobility. Pt is MinA when ambulating due to LOB when walking. Pt followed by recliner due to fatigue. Pt ambulated about 65f before taking a seated rest break. Pt was able to ambulate back to room. No complaints during session. Pt SOB towards EOS. O2 levels checked. Pt to recliner after session with all needs met.      Post-mobility: 96% SPO2  MSet designer

## 2022-05-12 DIAGNOSIS — K219 Gastro-esophageal reflux disease without esophagitis: Secondary | ICD-10-CM | POA: Diagnosis not present

## 2022-05-12 DIAGNOSIS — D649 Anemia, unspecified: Secondary | ICD-10-CM

## 2022-05-12 DIAGNOSIS — R079 Chest pain, unspecified: Secondary | ICD-10-CM | POA: Diagnosis not present

## 2022-05-12 DIAGNOSIS — E871 Hypo-osmolality and hyponatremia: Secondary | ICD-10-CM | POA: Diagnosis not present

## 2022-05-12 LAB — CBC WITH DIFFERENTIAL/PLATELET
Abs Immature Granulocytes: 0.01 10*3/uL (ref 0.00–0.07)
Basophils Absolute: 0 10*3/uL (ref 0.0–0.1)
Basophils Relative: 1 %
Eosinophils Absolute: 0.2 10*3/uL (ref 0.0–0.5)
Eosinophils Relative: 4 %
HCT: 32.6 % — ABNORMAL LOW (ref 36.0–46.0)
Hemoglobin: 10.7 g/dL — ABNORMAL LOW (ref 12.0–15.0)
Immature Granulocytes: 0 %
Lymphocytes Relative: 39 %
Lymphs Abs: 1.5 10*3/uL (ref 0.7–4.0)
MCH: 30.6 pg (ref 26.0–34.0)
MCHC: 32.8 g/dL (ref 30.0–36.0)
MCV: 93.1 fL (ref 80.0–100.0)
Monocytes Absolute: 0.5 10*3/uL (ref 0.1–1.0)
Monocytes Relative: 14 %
Neutro Abs: 1.6 10*3/uL — ABNORMAL LOW (ref 1.7–7.7)
Neutrophils Relative %: 42 %
Platelets: 176 10*3/uL (ref 150–400)
RBC: 3.5 MIL/uL — ABNORMAL LOW (ref 3.87–5.11)
RDW: 16.1 % — ABNORMAL HIGH (ref 11.5–15.5)
WBC: 3.9 10*3/uL — ABNORMAL LOW (ref 4.0–10.5)
nRBC: 0 % (ref 0.0–0.2)

## 2022-05-12 LAB — COMPREHENSIVE METABOLIC PANEL
ALT: 12 U/L (ref 0–44)
AST: 17 U/L (ref 15–41)
Albumin: 2.9 g/dL — ABNORMAL LOW (ref 3.5–5.0)
Alkaline Phosphatase: 77 U/L (ref 38–126)
Anion gap: 5 (ref 5–15)
BUN: 11 mg/dL (ref 8–23)
CO2: 21 mmol/L — ABNORMAL LOW (ref 22–32)
Calcium: 8 mg/dL — ABNORMAL LOW (ref 8.9–10.3)
Chloride: 104 mmol/L (ref 98–111)
Creatinine, Ser: 1.41 mg/dL — ABNORMAL HIGH (ref 0.44–1.00)
GFR, Estimated: 37 mL/min — ABNORMAL LOW (ref >60)
Glucose, Bld: 81 mg/dL (ref 70–99)
Potassium: 4.6 mmol/L (ref 3.5–5.1)
Sodium: 130 mmol/L — ABNORMAL LOW (ref 135–145)
Total Bilirubin: 0.6 mg/dL (ref 0.3–1.2)
Total Protein: 5.6 g/dL — ABNORMAL LOW (ref 6.5–8.1)

## 2022-05-12 LAB — PHOSPHORUS: Phosphorus: 2.8 mg/dL (ref 2.5–4.6)

## 2022-05-12 LAB — OSMOLALITY, URINE: Osmolality, Ur: 91 mOsm/kg — ABNORMAL LOW (ref 300–900)

## 2022-05-12 LAB — MAGNESIUM: Magnesium: 2.1 mg/dL (ref 1.7–2.4)

## 2022-05-12 MED ORDER — POLYSACCHARIDE IRON COMPLEX 150 MG PO CAPS
150.0000 mg | ORAL_CAPSULE | Freq: Every day | ORAL | Status: DC
  Administered 2022-05-12 – 2022-05-13 (×2): 150 mg via ORAL
  Filled 2022-05-12 (×2): qty 1

## 2022-05-12 MED ORDER — METOPROLOL SUCCINATE ER 50 MG PO TB24
50.0000 mg | ORAL_TABLET | Freq: Every day | ORAL | Status: DC
  Administered 2022-05-12 – 2022-05-13 (×2): 50 mg via ORAL
  Filled 2022-05-12 (×2): qty 1

## 2022-05-12 NOTE — Plan of Care (Signed)
  Problem: Clinical Measurements: Goal: Respiratory complications will improve Outcome: Progressing   Problem: Activity: Goal: Risk for activity intolerance will decrease Outcome: Progressing   Problem: Pain Managment: Goal: General experience of comfort will improve Outcome: Progressing   Problem: Safety: Goal: Ability to remain free from injury will improve Outcome: Progressing   

## 2022-05-12 NOTE — Progress Notes (Signed)
Mobility Specialist - Progress Note   05/12/22 0959  Mobility  Activity Ambulated with assistance in hallway  Level of Assistance Contact guard assist, steadying assist  Assistive Device Front wheel walker  Distance Ambulated (ft) 250 ft  RUE Weight Bearing NWB  Activity Response Tolerated well  Mobility Referral Yes  $Mobility charge 1 Mobility   Pt received in recliner and agreeable to mobility. Pt took one standing break due to fatigue, encouraged pursed lip breathing. No complaints during ambulation. 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    [facility]

## 2022-05-12 NOTE — Progress Notes (Signed)
PROGRESS NOTE    Christy Wade  YJE:[id number] DOB: September 20, 1935 DOA: 05/08/2022 PCP: Luetta Nutting, DO   Brief Narrative: No notes on file   Assessment and Plan:  Chest pain Patient was ruled out for ACS. Chest pain improved. Likely related to reflux disease. -Continue Protonix  Tachycardia Appears to likely be rebound secondary to cessation of home metoprolol. Heart rate up to 155 bpm while ambulating per PT documentation. -Resume home metoprolol  Hyponatremia Acute on chronic with sodium down to 121. Initial improvement with IV fluids. Nephrology consulted for refractory hyponatremia and discontinued mirtazapine and Tramadol. Sodium stable.  AKI No recent baseline. Creatinine of 1.25 on admission which had remained stable until increase up to 1.57. now improving. Patient possibly has progressed to CKD. This will need follow-up as an outpatient.  E. Coli UTI Patient started on Ceftriaxone for treatment.  Dyspnea on exertion Unsure etiology. CTA chest without evidence of PE. Symptoms have resolved. -Continue flutter valve and incentive spirometry  Leukopenia Mild. Resolved.  Normocytic anemia Slight downward drift but appears stable. No evidence of bleeding.   DVT prophylaxis: Heparin subq Code Status:   Code Status: DNR Family Communication: None at bedside Disposition Plan: Discharge back to ALF likely in 24 hours if heart rates improved   Consultants:  Nephrology  Procedures:    Antimicrobials:     Subjective: Patient without concerns this morning. Feels well.  Objective: BP (!) 143/70 (BP Location: Left Arm)   Pulse 88   Temp 98.4 F (36.9 C) (Oral)   Resp 17   Ht '5\' 9"'$  (1.753 m)   Wt 73.6 kg   SpO2 100%   BMI 23.96 kg/m   Examination:  General exam: Appears calm and comfortable Respiratory system: Clear to auscultation. Respiratory effort normal. Cardiovascular system: S1 & S2 heard, RRR. 1/6 systolic murmur Gastrointestinal  system: Abdomen is nondistended, soft and nontender. No organomegaly or masses felt. Normal bowel sounds heard. Central nervous system: Alert and oriented. No focal neurological deficits. Musculoskeletal: No calf tenderness Skin: No cyanosis. No rashes Psychiatry: Judgement and insight appear normal. Mood & affect appropriate.    Data Reviewed: I have personally reviewed following labs and imaging studies  CBC Lab Results  Component Value Date   WBC 3.9 (L) 05/12/2022   RBC 3.50 (L) 05/12/2022   HGB 10.7 (L) 05/12/2022   HCT 32.6 (L) 05/12/2022   MCV 93.1 05/12/2022   MCH 30.6 05/12/2022   PLT 176 05/12/2022   MCHC 32.8 05/12/2022   RDW 16.1 (H) 05/12/2022   LYMPHSABS 1.5 05/12/2022   MONOABS 0.5 05/12/2022   EOSABS 0.2 05/12/2022   BASOSABS 0.0 63/78/5885     Last metabolic panel Lab Results  Component Value Date   NA 130 (L) 05/12/2022   K 4.6 05/12/2022   CL 104 05/12/2022   CO2 21 (L) 05/12/2022   BUN 11 05/12/2022   CREATININE 1.41 (H) 05/12/2022   GLUCOSE 81 05/12/2022   GFRNONAA 37 (L) 05/12/2022   GFRAA 62 06/30/2020   CALCIUM 8.0 (L) 05/12/2022   PHOS 2.8 05/12/2022   PROT 5.6 (L) 05/12/2022   ALBUMIN 2.9 (L) 05/12/2022   BILITOT 0.6 05/12/2022   ALKPHOS 77 05/12/2022   AST 17 05/12/2022   ALT 12 05/12/2022   ANIONGAP 5 05/12/2022    GFR: Estimated Creatinine Clearance: 31.6 mL/min (A) (by C-G formula based on SCr of 1.41 mg/dL (H)).  Recent Results (from the past 240 hour(s))  Resp Panel by RT-PCR (Flu A&B,  Covid) Anterior Nasal Swab     Status: None   Collection Time: 05/08/22  5:58 PM   Specimen: Anterior Nasal Swab  Result Value Ref Range Status   SARS Coronavirus 2 by RT PCR NEGATIVE NEGATIVE Final    Comment: (NOTE) SARS-CoV-2 target nucleic acids are NOT DETECTED.  The SARS-CoV-2 RNA is generally detectable in upper respiratory specimens during the acute phase of infection. The lowest concentration of SARS-CoV-2 viral copies this assay  can detect is 138 copies/mL. A negative result does not preclude SARS-Cov-2 infection and should not be used as the sole basis for treatment or other patient management decisions. A negative result may occur with  improper specimen collection/handling, submission of specimen other than nasopharyngeal swab, presence of viral mutation(s) within the areas targeted by this assay, and inadequate number of viral copies(<138 copies/mL). A negative result must be combined with clinical observations, patient history, and epidemiological information. The expected result is Negative.  Fact Sheet for Patients:  EntrepreneurPulse.com.au  Fact Sheet for Healthcare Providers:  IncredibleEmployment.be  This test is no t yet approved or cleared by the [place] FDA and  has been authorized for detection and/or diagnosis of SARS-CoV-2 by FDA under an Emergency Use Authorization (EUA). This EUA will remain  in effect (meaning this test can be used) for the duration of the COVID-19 declaration under Section 564(b)(1) of the Act, 21 U.S.C.section 360bbb-3(b)(1), unless the authorization is terminated  or revoked sooner.       Influenza A by PCR NEGATIVE NEGATIVE Final   Influenza B by PCR NEGATIVE NEGATIVE Final    Comment: (NOTE) The Xpert Xpress SARS-CoV-2/FLU/RSV plus assay is intended as an aid in the diagnosis of influenza from Nasopharyngeal swab specimens and should not be used as a sole basis for treatment. Nasal washings and aspirates are unacceptable for Xpert Xpress SARS-CoV-2/FLU/RSV testing.  Fact Sheet for Patients: EntrepreneurPulse.com.au  Fact Sheet for Healthcare Providers: IncredibleEmployment.be  This test is not yet approved or cleared by the [place] FDA and has been authorized for detection and/or diagnosis of SARS-CoV-2 by FDA under an Emergency Use Authorization (EUA). This EUA will  remain in effect (meaning this test can be used) for the duration of the COVID-19 declaration under Section 564(b)(1) of the Act, 21 U.S.C. section 360bbb-3(b)(1), unless the authorization is terminated or revoked.  Performed at [facility], [place] [address] [place], [place] [address]   Urine Culture     Status: Abnormal   Collection Time: 05/09/22  2:03 PM   Specimen: Urine, Suprapubic  Result Value Ref Range Status   Specimen Description   Final    URINE, SUPRAPUBIC Performed at [facility] [address] [place], [place] [address]    Special Requests   Final    NONE Performed at [facility], [place] [address], [place], [place] [address]    Culture >=100,000 COLONIES/mL ESCHERICHIA COLI (A)  Final   Report Status 05/11/2022 FINAL  Final   Organism ID, Bacteria ESCHERICHIA COLI (A)  Final      Susceptibility   Escherichia coli - MIC*    AMPICILLIN >=32 RESISTANT Resistant     CEFAZOLIN >=64 RESISTANT Resistant     CEFEPIME <=0.12 SENSITIVE Sensitive     CEFTRIAXONE 1 SENSITIVE Sensitive     CIPROFLOXACIN >=4 RESISTANT Resistant     GENTAMICIN <=1 SENSITIVE Sensitive     IMIPENEM <=0.25 SENSITIVE Sensitive     NITROFURANTOIN <=16 SENSITIVE Sensitive  TRIMETH/SULFA >=320 RESISTANT Resistant     AMPICILLIN/SULBACTAM 16 INTERMEDIATE Intermediate     PIP/TAZO <=4 SENSITIVE Sensitive     * >=100,000 COLONIES/mL ESCHERICHIA COLI      Radiology Studies: No results found.    LOS: 3 days    Cordelia Poche, MD [facility] 05/12/2022, 5:12 PM   If 7PM-7AM, please contact night-coverage [email or web address]

## 2022-05-12 NOTE — Progress Notes (Signed)
Occupational Therapy Treatment Patient Details Name: APRILL BANKO MRN: [id number] DOB: [birth year] Today's Date: 05/12/2022   History of present illness Patient is a 86 year old female who presented with mid chest and epigastric discomfort with reports of  frequent burping.patient was admitted with epigastric pain acute hyponatremia, AKI, and cystitis. Patient was noted to have had recent humral fracture with imaging revealing   "Oblique fracture of the proximal humeral diaphysis with 2 cm of  lateral displacement and 18 mm of overriding between the major  fracture fragments. Severe joint space narrowing of the right glenohumeral joint with  osteolysis of the humeral head as can be seen with a [place]  shoulder more severe advanced osteoarthritis." patient is being followed by Dr. Doy Mince with last visit 10/9 with recommendations of NWB and sarmiento brace at all times per chart review. QMV:[id number], HTN, hypothyroidism, anemia   OT comments  Patient was motivated to participate in therapy. She required min guard to min assist for functional transfers, including supine to sit and sit to stand. She required HHA on the left to ambulate to & from the bathroom in her room, given a prior R UE fracture and NWB status. She required assist for toileting at bathroom level, as well as for donning a hospital gown seated & min guard assist was required for hand washing standing at the sink. She is making gradual functional progress. Continue OT plan of care.   Recommendations for follow up therapy are one component of a multi-disciplinary discharge planning process, led by the attending physician.  Recommendations may be updated based on patient status, additional functional criteria and insurance authorization.    Follow Up Recommendations  Home health OT       Patient can return home with the following  A little help with walking and/or transfers;A lot of help with  bathing/dressing/bathroom;Assistance with cooking/housework;Direct supervision/assist for medications management;Assist for transportation;Help with stairs or ramp for entrance;Direct supervision/assist for financial management   Equipment Recommendations  None recommended by OT       Precautions / Restrictions Precautions Precautions: Fall Precaution Comments: sarmiento brace on RUE, ok for hand wrist and elbow ROM per Dr.Reynolds note 10/9; monitor heartrate Restrictions Weight Bearing Restrictions: Yes RUE Weight Bearing: Non weight bearing       Mobility Bed Mobility Overal bed mobility: Needs Assistance Bed Mobility: Supine to Sit     Supine to sit: Min guard, HOB elevated          Transfers Overall transfer level: Needs assistance Equipment used: 1 person hand held assist Transfers: Sit to/from Stand Sit to Stand: Min assist           General transfer comment: min A to steady with powering to stand         ADL either performed or assessed with clinical judgement   ADL Overall ADL's : Needs assistance/impaired Eating/Feeding: Set up Eating/Feeding Details (indicate cue type and reason): seated, based on clinical judgement Grooming: Min guard;Wash/dry hands Grooming Details (indicate cue type and reason): She performed hand washing at sink level         Upper Body Dressing : Sitting;Moderate assistance Upper Body Dressing Details (indicate cue type and reason): Instruction provided on hemi-technique to perform, given R UE brace and ROM limitations due to prior fracture Lower Body Dressing: Moderate assistance Lower Body Dressing Details (indicate cue type and reason): able to slide on shoes sitting EOB with set up and Supervision, however required assist to don her underwear after  performing toileting tasks Toilet Transfer: Min Psychiatric nurse Details (indicate cue type and reason): verbal cues provided for general transfer technqiue, including  reaching back for hand rail prior to sitting Toileting- Clothing Manipulation and Hygiene: Maximal assistance;Sit to/from stand Toileting - Clothing Manipulation Details (indicate cue type and reason): required assist for posterior peri-hygiene after a bowel movement, steadying assist and assist for clothing management              Cognition Arousal/Alertness: Awake/alert Behavior During Therapy: WFL for tasks assessed/performed Overall Cognitive Status: Within Functional Limits for tasks assessed                General Comments: appropriate during session              General Comments     Pertinent Vitals/ Pain       Pain Assessment Pain Assessment: 0-10 Pain Score: 3  Pain Location: RUE Pain Intervention(s): Limited activity within patient's tolerance, Repositioned         Frequency  Min 2X/week        Progress Toward Goals  OT Goals(current goals can now be found in the care plan section)  Progress towards OT goals: Progressing toward goals  Acute Rehab OT Goals Patient Stated Goal: to return to her ALF OT Goal Formulation: With patient Time For Goal Achievement: 05/24/22 Potential to Achieve Goals: Good  Plan Discharge plan remains appropriate       AM-PAC OT "6 Clicks" Daily Activity     Outcome Measure   Help from another person eating meals?: None Help from another person taking care of personal grooming?: A Little Help from another person toileting, which includes using toliet, bedpan, or urinal?: A Lot Help from another person bathing (including washing, rinsing, drying)?: A Lot Help from another person to put on and taking off regular upper body clothing?: A Lot Help from another person to put on and taking off regular lower body clothing?: A Lot 6 Click Score: 15    End of Session    OT Visit Diagnosis: Unsteadiness on feet (R26.81);Other abnormalities of gait and mobility (R26.89);Muscle weakness (generalized) (M62.81);Pain    Activity Tolerance Patient tolerated treatment well   Patient Left in chair;with call bell/phone within reach;with chair alarm set   Nurse Communication Mobility status        Time: 1710-1729 OT Time Calculation (min): 19 min  Charges: OT General Charges $OT Visit: 1 Visit OT Treatments $Self Care/Home Management : 8-22 mins    Leota Sauers, OTR/L 05/12/2022, 5:39 PM

## 2022-05-12 NOTE — Progress Notes (Signed)
Physical Therapy Treatment Patient Details Name: Christy Wade MRN: [id number] DOB: 06-Jul-1936 Today's Date: 05/12/2022   History of Present Illness Patient is a 86 year old female who presented with mid chest and epigastric discomfort with reports of  frequent burping.patient was admitted with epigastric pain acute hyponatremia, AKI, and cystitis. Patient was noted to have had recent humral fracture with imaging revealing   "Oblique fracture of the proximal humeral diaphysis with 2 cm of  lateral displacement and 18 mm of overriding between the major  fracture fragments. Severe joint space narrowing of the right glenohumeral joint with  osteolysis of the humeral head as can be seen with a [place]  shoulder more severe advanced osteoarthritis." patient is being followed by Dr. Doy Mince with last visit 10/9 with recommendations of NWB and sarmiento brace at all times per chart review. YWV:[id number], HTN, hypothyroidism, anemia    PT Comments    Pt pleasant, motivated to ambulate, discouraged that not ambulating as far as Monday but accepts encouragement. Educated pt on transfers, using LUE to steady self, bil LE braced against bed and min A to steady with powering up and for controlled descent. Pt ambulates with single HHA, NWB RUE, min A to steady and LOB with turning needing mod A to recover. Cues for upright posture and weight-shifting with stepping with LUE support only while ambulating, min A to steady. Pt HR up to 155, reports SOB, SpO2 >92%- RN notified. Pt remains in recliner at [place] with chair alarm on and all needs in reach.    Recommendations for follow up therapy are one component of a multi-disciplinary discharge planning process, led by the attending physician.  Recommendations may be updated based on patient status, additional functional criteria and insurance authorization.  Follow Up Recommendations  Home health PT     Assistance Recommended at Discharge Frequent or constant  Supervision/Assistance  Patient can return home with the following A lot of help with walking and/or transfers;A lot of help with bathing/dressing/bathroom;Assistance with cooking/housework;Assist for transportation;Help with stairs or ramp for entrance   Equipment Recommendations  None recommended by PT    Recommendations for Other Services       Precautions / Restrictions Precautions Precautions: Fall Precaution Comments: sarmiento brace on RUE, ok for hand wrist and elbow ROM per Dr.Reynolds note 10/9; monitor heartrate Restrictions Weight Bearing Restrictions: Yes RUE Weight Bearing: Non weight bearing     Mobility  Bed Mobility  General bed mobility comments: in recliner    Transfers Overall transfer level: Needs assistance Equipment used: 1 person hand held assist Transfers: Sit to/from Stand Sit to Stand: Min assist  General transfer comment: min A to steady with powering to stand    Ambulation/Gait Ambulation/Gait assistance: Min assist, Mod assist Gait Distance (Feet): 40 Feet Assistive device: 1 person hand held assist Gait Pattern/deviations: Step-through pattern, Decreased stride length, Trunk flexed Gait velocity: decreased  General Gait Details: therapist supporting pt at LUE, R UE NWB with brace on, min A with straightline gait, LOB with turn needing mod A to recover, maintains R lateral lean, SOB noted with SpO2 >92% and HR up to 155- RN notified   Stairs             Wheelchair Mobility    Modified Rankin (Stroke Patients Only)       Balance Overall balance assessment: History of Falls, Needs assistance  Standing balance support: During functional activity, Single extremity supported, Reliant on assistive device for balance Standing balance-Leahy Scale: Poor  Cognition Arousal/Alertness: Awake/alert Behavior During Therapy: WFL for tasks assessed/performed Overall Cognitive Status: Within Functional Limits for tasks assessed        Exercises      General Comments        Pertinent Vitals/Pain Pain Assessment Pain Assessment: No/denies pain    Home Living                          Prior Function            PT Goals (current goals can now be found in the care plan section) Acute Rehab PT Goals PT Goal Formulation: With patient Time For Goal Achievement: 05/24/22 Potential to Achieve Goals: Good Progress towards PT goals: Progressing toward goals    Frequency    Min 3X/week      PT Plan Current plan remains appropriate    Co-evaluation              AM-PAC PT "6 Clicks" Mobility   Outcome Measure  Help needed turning from your back to your side while in a flat bed without using bedrails?: A Little Help needed moving from lying on your back to sitting on the side of a flat bed without using bedrails?: A Little Help needed moving to and from a bed to a chair (including a wheelchair)?: A Little Help needed standing up from a chair using your arms (e.g., wheelchair or bedside chair)?: A Little Help needed to walk in hospital room?: A Lot Help needed climbing 3-5 steps with a railing? : Total 6 Click Score: 15    End of Session Equipment Utilized During Treatment: Gait belt Activity Tolerance: Patient tolerated treatment well Patient left: in chair;with call bell/phone within reach;with chair alarm set Nurse Communication: Mobility status PT Visit Diagnosis: Difficulty in walking, not elsewhere classified (R26.2);Unsteadiness on feet (R26.81)     Time: 8177-1165 PT Time Calculation (min) (ACUTE ONLY): 26 min  Charges:  $Gait Training: 8-22 mins $Therapeutic Activity: 8-22 mins                      Tori Nolawi Kanady PT, DPT 05/12/22, 11:41 AM

## 2022-05-12 NOTE — Care Management Important Message (Signed)
Important Message  Patient Details IM Letter given Name: Christy Wade MRN: [id number] Date of Birth: Oct 17, 1935   Medicare Important Message Given:  Yes     Kerin Salen 05/12/2022, 1:34 PM

## 2022-05-12 NOTE — Progress Notes (Signed)
[facility] Progress Note  Name: Christy Wade MRN: [id number] DOB: September 14, 1935  Chief Complaint:  Shortness of breath  Subjective:  Feels great.  She's excited about her sodium.  She's gotten up and gone to the restroom - strict ins/outs not available.  Hoping to go home soon. She's fine with stopping mirtazepine and tramadol  Review of systems:  Denies shortness of breath or chest pain Denies n/v ------------ Background on consult:   Christy Wade is a 86 y.o. female with a history of HTN, chronic combined systolic and diastolic CHF, osteoporosis and thyroid disease who presented to the hospital with chest discomfort and shortness of breath with activity.  She was found to have hyponatremia at 121 which improved with fluids per the consulting team but has since started to drop again.  Nephrology is consulted for assistance with management of hyponatremia as well as some mild acute kidney injury.  She has had hyponatremia in the past and this had most recently improved though there is a gap in labs since late 2021 and presentation.  In august 2021 sodium was in the low 110's at one point.  She has been on tramadol here.  She's willing to try tramadol.  We called her daughter on speakerphone together.  All questions answered.  The patient is an excellent historian.  She shows me bruises from recent falls   Intake/Output Summary (Last 24 hours) at 05/12/2022 1747 Last data filed at 05/12/2022 1442 Gross per 24 hour  Intake 814 ml  Output 1050 ml  Net -236 ml    Vitals:  Vitals:   05/12/22 0411 05/12/22 0900 05/12/22 1154 05/12/22 1326  BP: (!) 145/77 136/82 (!) 147/83 (!) 143/70  Pulse: 85 (!) 105 (!) 111 88  Resp: '17 19 17 17  '$ Temp: 98.1 F (36.7 C) 98.2 F (36.8 C) 98.5 F (36.9 C) 98.4 F (36.9 C)  TempSrc: Oral Oral Oral Oral  SpO2: 99% 99% 99% 100%  Weight:      Height:         Physical Exam:  General: elderly female in chair in NAD   HEENT:  NCAT Eyes: EOMI sclera anicteric Neck: supple trachea midline  Heart: S1S2 no rub Lungs: clear and unlabored on room air Abdomen: soft/nt/nd Extremities: right shoulder/arm in brace; no edema  Skin: no rash on extremities exposed; bruising on arms from fall and blood draws  Psych normal mood and affect Neuro: alert and oriented x 3 provides hx and follows commands  Medications reviewed   Labs:     Latest Ref Rng & Units 05/12/2022    5:08 AM 05/11/2022    5:30 AM 05/10/2022    5:27 AM  BMP  Glucose 70 - 99 mg/dL 81  86  84   BUN 8 - 23 mg/dL '11  13  10   '$ Creatinine 0.44 - 1.00 mg/dL 1.41  1.57  1.35   Sodium 135 - 145 mmol/L 130  128  130   Potassium 3.5 - 5.1 mmol/L 4.6  3.4  3.5   Chloride 98 - 111 mmol/L 104  101  101   CO2 22 - 32 mmol/L '21  20  21   '$ Calcium 8.9 - 10.3 mg/dL 8.0  7.5  7.7      Assessment/Plan:    # Hyponatremia - Improved with fluids initially then plateaued and starting to dip again.  She may have had altered intake with the UTI if she was symptomatic.  Also with  hx of recent fall(s).  In the past she thinks sodium was as low as 103 - improving   - We have stopped tramadol and mirtazepine - would remain off - continue synthroid   # AKI  - mild; two recent contrasted studies when GFR was mildly reduced  - continue supportive care  - improving    # chronic combined systolic and diastolic CHF - caution with fluids - on entresto     # UTI - per primary team. On ceftriaxone    # normocytic anemia   - iron deficiency  - start iron supplement   AKI and hyponatremia improving.  Nephrology will sign off.  Thank you for the consult     Christy Desanctis, MD 05/12/2022 5:58 PM

## 2022-05-13 DIAGNOSIS — N179 Acute kidney failure, unspecified: Secondary | ICD-10-CM | POA: Diagnosis not present

## 2022-05-13 DIAGNOSIS — R0789 Other chest pain: Secondary | ICD-10-CM

## 2022-05-13 DIAGNOSIS — R0609 Other forms of dyspnea: Secondary | ICD-10-CM

## 2022-05-13 DIAGNOSIS — E871 Hypo-osmolality and hyponatremia: Secondary | ICD-10-CM | POA: Diagnosis not present

## 2022-05-13 DIAGNOSIS — K219 Gastro-esophageal reflux disease without esophagitis: Secondary | ICD-10-CM

## 2022-05-13 DIAGNOSIS — R000 Tachycardia, unspecified: Secondary | ICD-10-CM

## 2022-05-13 MED ORDER — POLYSACCHARIDE IRON COMPLEX 150 MG PO CAPS: 150.0000 mg | ORAL_CAPSULE | Freq: Every day | ORAL | 2 refills | Status: AC

## 2022-05-13 NOTE — Hospital Course (Signed)
Christy Wade is a 86 y.o. 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  Patient presented secondary to chest discomfort and dyspnea with initial concern for possible ACS.  Work-up for ACS was negative.  Troponins are negative.  Echo without wall motion abnormalities or reduction in left ventricular EF.  Symptoms appear to be consistent with patient's known history of GERD and improved with treatment of GERD.  While she was admitted, she developed acute on chronic hyponatremia.  Initial response to IV fluids however patient with recurrent hyponatremia requiring nephrology consult.  Mirtazapine and tramadol discontinued with further improvement of sodium.  Patient also had a mild AKI which improved with IV fluids.

## 2022-05-13 NOTE — TOC Progression Note (Signed)
Transition of Care [facility]) - Progression Note    Patient Details  Name: Christy Wade MRN: [id number] Date of Birth: Jan 30, 1936  Transition of Care [facility]) CM/SW [place], RN Phone [phone]  05/13/2022, 12:45 PM  Clinical Narrative:    CM checking chart for possible discharge to find that patient has already been discharged. Patient is from [place]. CM attempted to call to make sure facility  has what they need [phone] there is no answer. Home health orders have been entered and patient will continue home health with [facility]. No other needs noted at this time.        Expected Discharge Plan and Services           Expected Discharge Date: 05/13/22                                     Social Determinants of Health (SDOH) Interventions    Readmission Risk Interventions    05/11/2022   10:58 AM  Readmission Risk Prevention Plan  Transportation Screening Complete  PCP or Specialist Appt within 5-7 Days Complete  Home Care Screening Complete  Medication Review (RN CM) Complete

## 2022-05-13 NOTE — Discharge Instructions (Signed)
Christy Wade,  You were in the hospital secondary to low sodium and kidney impairment. These issues have improved, thankfully. Some of your medications have been adjusted. Please follow-up with your PCP.

## 2022-05-13 NOTE — Discharge Summary (Signed)
Physician Discharge Summary   Patient: Christy Wade MRN: [id number] DOB: 1936/05/17  Admit date:     05/08/2022  Discharge date: 05/13/22  Discharge Physician: Cordelia Poche, MD   PCP: Luetta Nutting, DO   Recommendations at discharge:  Hospital follow-up with PCP Repeat BMP in 3-5 days  Discharge Diagnoses: Principal Problem:   Hyponatremia Active Problems:   Recurrent UTI   GERD (gastroesophageal reflux disease)   AKI (acute kidney injury) ([place])  Resolved Problems:   Atypical chest pain   Sinus tachycardia   Dyspnea on exertion  Hospital Course: Christy Wade is a 86 y.o. 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  Patient presented secondary to chest discomfort and dyspnea with initial concern for possible ACS.  Work-up for ACS was negative.  Troponins are negative.  Echo without wall motion abnormalities or reduction in left ventricular EF.  Symptoms appear to be consistent with patient's known history of GERD and improved with treatment of GERD.  While she was admitted, she developed acute on chronic hyponatremia.  Initial response to IV fluids however patient with recurrent hyponatremia requiring nephrology consult.  Mirtazapine and tramadol discontinued with further improvement of sodium.  Patient also had a mild AKI which improved with IV fluids.  Assessment and Plan:  Atypical chest pain GERD Patient was ruled out for ACS. Chest pain improved. Likely related to reflux disease. Continue Protonix. Chest pain resolved.   Sinus tachycardia Appears to likely be rebound secondary to cessation of home metoprolol. Heart rate up to 155 bpm while ambulating per PT documentation. Metoprolol resumed. Heart rates improved.   Hyponatremia Acute on chronic with sodium down to 121. Initial improvement with IV fluids. Nephrology consulted for refractory  hyponatremia and discontinued mirtazapine and Tramadol. Sodium stable at 130 prior to discharge.   AKI No recent baseline. Creatinine of 1.25 on admission which had remained stable until increase up to 1.57. Now improving with creatinine of 1.41 prior to discharge. Patient possibly has progressed to CKD but this will need follow-up as an outpatient.   E. Coli UTI Patient started on Ceftriaxone for treatment. Completed 3 days prior to discharge.   Dyspnea on exertion Unsure etiology. CTA chest without evidence of PE. Symptoms have resolved. Continue flutter valve and incentive spirometry.   Leukopenia Mild. Stable.   Normocytic anemia Slight downward drift but appears stable. No evidence of bleeding.  Cataracts Continue eye drops. There should be no barrier to cataract surgery as it relates to this current admission.   Consultants: Nephrology Procedures performed: Transthoracic Echocardiogram  Disposition: Assisted living Diet recommendation: Soft diet   DISCHARGE MEDICATION: Allergies as of 05/13/2022       Reactions   Codeine Nausea Only   Sulfamethoxazole Rash, Other (See Comments)   Pt states "seems like I break out"        Medication List     STOP taking these medications    ALPRAZolam 0.25 MG tablet Commonly known as: XANAX   diphenoxylate-atropine 2.5-0.025 MG tablet Commonly known as: Lomotil   furosemide 40 MG tablet Commonly known as: LASIX   hydrALAZINE 25 MG tablet Commonly known as: APRESOLINE   mirtazapine 15 MG tablet Commonly known as: Remeron   potassium chloride 20 MEQ/15ML (10%) Soln   spironolactone 25 MG tablet Commonly known as: ALDACTONE   traMADol 50 MG tablet Commonly known as: ULTRAM       TAKE these  medications    (feeding supplement) PROSource Plus liquid Take 30 mLs by mouth daily.   acetaminophen 500 MG tablet Commonly known as: TYLENOL Take 1,000 mg by mouth every 8 (eight) hours as needed (for pain).   BAZA  PROTECT MOISTURE BARRIER EX Apply 1 application  topically every 6 (six) hours as needed (for redness- affected sites).   Biofreeze 4 % Gel Generic drug: Menthol (Topical Analgesic) Apply 3 oz topically 3 (three) times daily as needed.   CALCIUM 600+D PLUS MINERALS PO Take 1 tablet by mouth daily.   Cranberry 450 MG Tabs Take 450 mg by mouth daily.   cycloSPORINE 0.05 % ophthalmic emulsion Commonly known as: RESTASIS Place 1 drop into both eyes 2 (two) times daily.   iron polysaccharides 150 MG capsule Commonly known as: NIFEREX Take 1 capsule (150 mg total) by mouth daily. Start taking on: May 14, 2022   ketorolac 0.4 % Soln Commonly known as: ACULAR Place 1 drop into the right eye 4 (four) times daily.   levothyroxine 75 MCG tablet Commonly known as: SYNTHROID Take 75 mcg by mouth daily before breakfast. What changed: Another medication with the same name was removed. Continue taking this medication, and follow the directions you see here.   loperamide 2 MG tablet Commonly known as: IMODIUM A-D Take 2 mg by mouth every 6 (six) hours as needed for diarrhea or loose stools.   metoprolol succinate 50 MG 24 hr tablet Commonly known as: TOPROL-XL Take 1 tablet (50 mg total) by mouth daily. Take with or immediately following a meal.   moxifloxacin 0.5 % ophthalmic solution Commonly known as: VIGAMOX Place 1 drop into the right eye 4 (four) times daily.   nystatin powder Commonly known as: MYCOSTATIN/NYSTOP Apply 1 Application topically every 8 (eight) hours as needed (for rashes or redness- affected areas).   OcuSoft Lid Scrub Original Pads Place 1 Pad into both eyes 2 (two) times daily.   pantoprazole 40 MG tablet Commonly known as: PROTONIX Take 1 tablet (40 mg total) by mouth daily.   prednisoLONE acetate 1 % ophthalmic suspension Commonly known as: PRED FORTE Place 1 drop into the right eye 4 (four) times daily. Place 1 drop into the right eye four times a  day for 7 days, then three times a day for 7 days, then two times a day for 7 days, then once a day for 7 days, then D/C   Refresh Relieva PF 0.5-1 % Soln Generic drug: Carboxymethylcell-Glycerin PF Place 1 drop into both eyes 4 (four) times daily.   sacubitril-valsartan 24-26 MG Commonly known as: ENTRESTO Take 1 tablet by mouth 2 (two) times daily. Please check BP prior to administration and hold if MAP is less than 65 What changed: additional instructions   simethicone 125 MG chewable tablet Commonly known as: MYLICON Chew 353 mg by mouth 3 (three) times daily.   Tums 500 MG chewable tablet Generic drug: calcium carbonate Chew 1 tablet by mouth 3 (three) times daily.   Voltaren 1 % Gel Generic drug: diclofenac Sodium Apply 2 g topically every 8 (eight) hours as needed (for pain- affected areas of shoulders, back, and right elbow).        Discharge Exam: BP 129/74   Pulse 90   Temp 98.1 F (36.7 C) (Oral)   Resp 17   Ht '5\' 9"'$  (1.753 m)   Wt 73.6 kg   SpO2 97%   BMI 23.96 kg/m   General exam: Appears calm and comfortable  Respiratory system: Respiratory effort normal.  Condition at discharge: stable  The results of significant diagnostics from this hospitalization (including imaging, microbiology, ancillary and laboratory) are listed below for reference.   Imaging Studies: ECHOCARDIOGRAM COMPLETE  Result Date: 05/09/2022    ECHOCARDIOGRAM REPORT   Patient Name:   HALEI HANOVER Date of Exam: 05/09/2022 Medical Rec #:  [id number]       Height:       69.0 in Accession #:    [id number]      Weight:       150.0 lb Date of Birth:  February 27, 1936      BSA:          1.828 m Patient Age:    86 years        BP:           137/67 mmHg Patient Gender: F               HR:           85 bpm. Exam Location:  Inpatient Procedure: 2D Echo Indications:    Dyspnea  History:        Patient has prior history of Echocardiogram examinations, most                 recent 06/12/2020. Risk  Factors:Hypertension.  Sonographer:    Harvie Junior Referring Phys: [id number] OMAIR LATIF [place]  1. Left ventricular ejection fraction, by estimation, is 55 to 60%. The left ventricle has normal function. The left ventricle has no regional wall motion abnormalities. Left ventricular diastolic parameters are indeterminate.  2. Right ventricular systolic function is normal. The right ventricular size is normal. There is normal pulmonary artery systolic pressure.  3. The mitral valve is normal in structure. Mild mitral valve regurgitation. No evidence of mitral stenosis.  4. The tricuspid valve is abnormal.  5. The aortic valve was not well visualized. There is mild calcification of the aortic valve. There is mild thickening of the aortic valve. Aortic valve regurgitation is mild. No aortic stenosis is present.  6. The inferior vena cava is normal in size with <50% respiratory variability, suggesting right atrial pressure of 8 mmHg. FINDINGS  Left Ventricle: Left ventricular ejection fraction, by estimation, is 55 to 60%. The left ventricle has normal function. The left ventricle has no regional wall motion abnormalities. The left ventricular internal cavity size was normal in size. There is  no left ventricular hypertrophy. Left ventricular diastolic parameters are indeterminate. Right Ventricle: The right ventricular size is normal. Right vetricular wall thickness was not well visualized. Right ventricular systolic function is normal. There is normal pulmonary artery systolic pressure. The tricuspid regurgitant velocity is 2.50 m/s, and with an assumed right atrial pressure of 3 mmHg, the estimated right ventricular systolic pressure is 42.7 mmHg. Left Atrium: Left atrial size was normal in size. Right Atrium: Right atrial size was normal in size. Pericardium: There is no evidence of pericardial effusion. Mitral Valve: The mitral valve is normal in structure. Mild mitral valve regurgitation. No evidence of  mitral valve stenosis. Tricuspid Valve: The tricuspid valve is abnormal. Tricuspid valve regurgitation is mild . No evidence of tricuspid stenosis. Aortic Valve: The aortic valve was not well visualized. There is mild calcification of the aortic valve. There is mild thickening of the aortic valve. There is mild aortic valve annular calcification. Aortic valve regurgitation is mild. Aortic regurgitation PHT measures 511 msec. No aortic stenosis is present. Aortic valve mean gradient measures  7.0 mmHg. Aortic valve peak gradient measures 13.8 mmHg. Aortic valve area, by VTI measures 2.54 cm. Pulmonic Valve: The pulmonic valve was not well visualized. Pulmonic valve regurgitation is not visualized. No evidence of pulmonic stenosis. Aorta: The aortic root is normal in size and structure. Venous: The inferior vena cava is normal in size with less than 50% respiratory variability, suggesting right atrial pressure of 8 mmHg. IAS/Shunts: No atrial level shunt detected by color flow Doppler.  LEFT VENTRICLE PLAX 2D LVIDd:         3.70 cm     Diastology LVIDs:         3.00 cm     LV e' medial:    5.77 cm/s LV PW:         1.00 cm     LV E/e' medial:  16.5 LV IVS:        1.00 cm     LV e' lateral:   10.20 cm/s LVOT diam:     2.00 cm     LV E/e' lateral: 9.3 LV SV:         73 LV SV Index:   40 LVOT Area:     3.14 cm  LV Volumes (MOD) LV vol d, MOD A2C: 82.6 ml LV vol d, MOD A4C: 76.1 ml LV vol s, MOD A2C: 46.5 ml LV vol s, MOD A4C: 37.2 ml LV SV MOD A2C:     36.1 ml LV SV MOD A4C:     76.1 ml LV SV MOD BP:      41.4 ml RIGHT VENTRICLE RV Basal diam:  4.40 cm RV Mid diam:    3.30 cm RV S prime:     15.40 cm/s TAPSE (M-mode): 2.7 cm LEFT ATRIUM             Index        RIGHT ATRIUM           Index LA diam:        3.20 cm 1.75 cm/m   RA Area:     14.90 cm LA Vol (A2C):   62.6 ml 34.24 ml/m  RA Volume:   44.40 ml  24.29 ml/m LA Vol (A4C):   49.2 ml 26.91 ml/m LA Biplane Vol: 56.0 ml 30.63 ml/m  AORTIC VALVE                      PULMONIC VALVE AV Area (Vmax):    1.88 cm      PV Vmax:       1.26 m/s AV Area (Vmean):   2.17 cm      PV Peak grad:  6.4 mmHg AV Area (VTI):     2.54 cm AV Vmax:           185.50 cm/s AV Vmean:          118.000 cm/s AV VTI:            0.286 m AV Peak Grad:      13.8 mmHg AV Mean Grad:      7.0 mmHg LVOT Vmax:         111.00 cm/s LVOT Vmean:        81.400 cm/s LVOT VTI:          0.231 m LVOT/AV VTI ratio: 0.81 AI PHT:            511 msec  AORTA Ao Root diam: 3.30 cm Ao Asc diam:  2.50 cm MITRAL VALVE  TRICUSPID VALVE MV Area (PHT): 3.50 cm     TR Peak grad:   25.0 mmHg MV Decel Time: 217 msec     TR Vmax:        250.00 cm/s MR Peak grad: 85.9 mmHg MR Vmax:      463.33 cm/s   SHUNTS MV E velocity: 95.10 cm/s   Systemic VTI:  0.23 m MV A velocity: 116.00 cm/s  Systemic Diam: 2.00 cm MV E/A ratio:  0.82 Carlyle Dolly MD Electronically signed by Carlyle Dolly MD Signature Date/Time: 05/09/2022/1:51:18 PM    Final    CT Angio Chest PE W/Cm &/Or Wo Cm  Result Date: 05/08/2022 CLINICAL DATA:  Chest pain. EXAM: CT ANGIOGRAPHY CHEST WITH CONTRAST TECHNIQUE: Multidetector CT imaging of the chest was performed using the standard protocol during bolus administration of intravenous contrast. Multiplanar CT image reconstructions and MIPs were obtained to evaluate the vascular anatomy. RADIATION DOSE REDUCTION: This exam was performed according to the departmental dose-optimization program which includes automated exposure control, adjustment of the mA and/or kV according to patient size and/or use of iterative reconstruction technique. CONTRAST:  139m OMNIPAQUE IOHEXOL 350 MG/ML SOLN COMPARISON:  June 12, 2020. FINDINGS: Cardiovascular: Satisfactory opacification of the pulmonary arteries to the segmental level. No evidence of pulmonary embolism. Normal heart size. No pericardial effusion. Mediastinum/Nodes: No enlarged mediastinal, hilar, or axillary lymph nodes. Thyroid gland, trachea, and  esophagus demonstrate no significant findings. Lungs/Pleura: No pneumothorax or pleural effusion is noted. Mild right posterior lower lobe scarring or subsegmental atelectasis due to old adjacent rib fracture. Upper Abdomen: No acute abnormality. Musculoskeletal: Stable appearance of old right posterior rib fractures. No acute osseous abnormality is noted. Review of the MIP images confirms the above findings. IMPRESSION: No definite evidence of pulmonary embolus. Mild right posterior lower lobe scarring or subsegmental atelectasis is noted due to old adjacent displaced right rib fracture. Aortic Atherosclerosis (ICD10-I70.0). Electronically Signed   By: JMarijo ConceptionM.D.   On: 05/08/2022 15:34   CT Abdomen Pelvis W Contrast  Result Date: 05/08/2022 CLINICAL DATA:  Indigestion.  Abdominal pain. EXAM: CT ABDOMEN AND PELVIS WITH CONTRAST TECHNIQUE: Multidetector CT imaging of the abdomen and pelvis was performed using the standard protocol following bolus administration of intravenous contrast. RADIATION DOSE REDUCTION: This exam was performed according to the departmental dose-optimization program which includes automated exposure control, adjustment of the mA and/or kV according to patient size and/or use of iterative reconstruction technique. CONTRAST:  1072mOMNIPAQUE IOHEXOL 350 MG/ML SOLN COMPARISON:  None Available. FINDINGS: Lower chest: Please see separately dictated CT chest for findings above the diaphragm. Hepatobiliary: Liver has a normal contour. No evidence of intra or extrahepatic biliary ductal dilatation. There is a small hypodense lesion in the anterior margin of the left hepatic lobe, too small to accurately characterize. Gallbladder is distended without findings to suggest cholecystitis. No evidence of cholelithiasis. Pancreas: No pancreatic ductal dilatation or surrounding inflammatory changes. Spleen: Normal in size without focal abnormality. Adrenals/Urinary Tract: Adrenal glands are  unremarkable. Left kidney is normal in size without evidence of hydronephrosis or nephrolithiasis. The right kidney small in size with calcification noted at the origin of the right renal artery. Hypodense lesion along the posterior margin of the right kidney is favored to represent a renal cyst. The urinary bladder is fluid-filled and distended with mild circumferential wall thickening. Stomach/Bowel: Stomach, small bowel, large bowel are normal in caliber. No evidence of bowel obstruction.Prior appendectomy. There is a small bowel containing right inguinal  hernia. Small hiatal hernia. Vascular/Lymphatic: No significant vascular findings are present. No enlarged abdominal or pelvic lymph nodes. Reproductive: Status post hysterectomy. No adnexal masses. Other: No abdominopelvic ascites. Musculoskeletal: No acute or significant osseous findings. Chronic degenerative changes of the lumbar spine without evidence of acute compression deformity. IMPRESSION: 1. No acute abdominal abnormality. 2. Mild circumferential wall thickening of the urinary bladder, which is nonspecific, but could be seen with cystitis. Correlate with urinalysis. Electronically Signed   By: Marin Roberts M.D.   On: 05/08/2022 15:28    Microbiology: Results for orders placed or performed during the hospital encounter of 05/08/22  Resp Panel by RT-PCR (Flu A&B, Covid) Anterior Nasal Swab     Status: None   Collection Time: 05/08/22  5:58 PM   Specimen: Anterior Nasal Swab  Result Value Ref Range Status   SARS Coronavirus 2 by RT PCR NEGATIVE NEGATIVE Final    Comment: (NOTE) SARS-CoV-2 target nucleic acids are NOT DETECTED.  The SARS-CoV-2 RNA is generally detectable in upper respiratory specimens during the acute phase of infection. The lowest concentration of SARS-CoV-2 viral copies this assay can detect is 138 copies/mL. A negative result does not preclude SARS-Cov-2 infection and should not be used as the sole basis for treatment  or other patient management decisions. A negative result may occur with  improper specimen collection/handling, submission of specimen other than nasopharyngeal swab, presence of viral mutation(s) within the areas targeted by this assay, and inadequate number of viral copies(<138 copies/mL). A negative result must be combined with clinical observations, patient history, and epidemiological information. The expected result is Negative.  Fact Sheet for Patients:  EntrepreneurPulse.com.au  Fact Sheet for Healthcare Providers:  IncredibleEmployment.be  This test is no t yet approved or cleared by the [place] FDA and  has been authorized for detection and/or diagnosis of SARS-CoV-2 by FDA under an Emergency Use Authorization (EUA). This EUA will remain  in effect (meaning this test can be used) for the duration of the COVID-19 declaration under Section 564(b)(1) of the Act, 21 U.S.C.section 360bbb-3(b)(1), unless the authorization is terminated  or revoked sooner.       Influenza A by PCR NEGATIVE NEGATIVE Final   Influenza B by PCR NEGATIVE NEGATIVE Final    Comment: (NOTE) The Xpert Xpress SARS-CoV-2/FLU/RSV plus assay is intended as an aid in the diagnosis of influenza from Nasopharyngeal swab specimens and should not be used as a sole basis for treatment. Nasal washings and aspirates are unacceptable for Xpert Xpress SARS-CoV-2/FLU/RSV testing.  Fact Sheet for Patients: EntrepreneurPulse.com.au  Fact Sheet for Healthcare Providers: IncredibleEmployment.be  This test is not yet approved or cleared by the [place] FDA and has been authorized for detection and/or diagnosis of SARS-CoV-2 by FDA under an Emergency Use Authorization (EUA). This EUA will remain in effect (meaning this test can be used) for the duration of the COVID-19 declaration under Section 564(b)(1) of the Act, 21 U.S.C. section  360bbb-3(b)(1), unless the authorization is terminated or revoked.  Performed at [facility], [place] [address] [place], [place] [address]   Urine Culture     Status: Abnormal   Collection Time: 05/09/22  2:03 PM   Specimen: Urine, Suprapubic  Result Value Ref Range Status   Specimen Description   Final    URINE, SUPRAPUBIC Performed at [facility] [address] [place] [address], [place], [place] [address]    Special Requests   Final    NONE Performed at [facility],  [place] [address] [place], [place] [address]    Culture >=100,000 COLONIES/mL ESCHERICHIA COLI (A)  Final   Report Status 05/11/2022 FINAL  Final   Organism ID, Bacteria ESCHERICHIA COLI (A)  Final      Susceptibility   Escherichia coli - MIC*    AMPICILLIN >=32 RESISTANT Resistant     CEFAZOLIN >=64 RESISTANT Resistant     CEFEPIME <=0.12 SENSITIVE Sensitive     CEFTRIAXONE 1 SENSITIVE Sensitive     CIPROFLOXACIN >=4 RESISTANT Resistant     GENTAMICIN <=1 SENSITIVE Sensitive     IMIPENEM <=0.25 SENSITIVE Sensitive     NITROFURANTOIN <=16 SENSITIVE Sensitive     TRIMETH/SULFA >=320 RESISTANT Resistant     AMPICILLIN/SULBACTAM 16 INTERMEDIATE Intermediate     PIP/TAZO <=4 SENSITIVE Sensitive     * >=100,000 COLONIES/mL ESCHERICHIA COLI    Labs: CBC: Recent Labs  Lab 05/08/22 1402 05/09/22 0634 05/10/22 0527 05/11/22 0530 05/12/22 0508  WBC 3.4* 3.8* 3.9* 4.3 3.9*  NEUTROABS 2.1 2.0 2.1 2.3 1.6*  HGB 11.2* 10.9* 10.2* 9.9* 10.7*  HCT 33.3* 32.6* 31.3* 29.7* 32.6*  MCV 89.5 90.8 92.1 92.2 93.1  PLT 193 175 166 151 416   Basic Metabolic Panel: Recent Labs  Lab 05/09/22 0634 05/09/22 1713 05/10/22 0527 05/11/22 0530 05/12/22 0508  NA 129* 130* 130* 128* 130*  K 3.9 3.9 3.5 3.4* 4.6  CL 99 99 101 101 104  CO2 20* 24 21* 20* 21*  GLUCOSE 74 82 84 86 81  BUN '8 10 10 13 11  '$ CREATININE 1.26* 1.30* 1.35* 1.57* 1.41*  CALCIUM 8.1* 8.1* 7.7* 7.5* 8.0*  MG  2.3  --  1.9 1.9 2.1  PHOS 3.4  --  3.0 3.0 2.8   Liver Function Tests: Recent Labs  Lab 05/08/22 1402 05/09/22 0634 05/10/22 0527 05/11/22 0530 05/12/22 0508  AST '27 22 19 16 17  '$ ALT '14 13 13 12 12  '$ ALKPHOS 88 81 73 64 77  BILITOT 1.3* 1.0 0.9 0.7 0.6  PROT 6.6 5.8* 5.5* 5.0* 5.6*  ALBUMIN 3.5 3.1* 2.8* 2.6* 2.9*    Discharge time spent: 35 minutes.  Signed: Cordelia Poche, MD [facility] 05/13/2022

## 2022-05-14 ENCOUNTER — Telehealth: Payer: Self-pay | Admitting: General Practice

## 2022-05-14 NOTE — Telephone Encounter (Signed)
Transition Care Management Follow-up Telephone Call Date of discharge and from where: 05/13/22 from [facility] How have you been since you were released from the hospital? Her daughter, Manuela Schwartz, reports that she is doing better. She is at the assisted living facility.  Any questions or concerns? No  Items Reviewed: Did the pt receive and understand the discharge instructions provided? Yes  Medications obtained and verified? Yes Other? No  Any new allergies since your discharge? No  Dietary orders reviewed? Yes Do you have support at home? Yes   Home Care and Equipment/Supplies: Were home health services ordered? no  Functional Questionnaire: (I = Independent and D = Dependent) ADLs: I  Bathing/Dressing- I  Meal Prep- I  Eating- I  Maintaining continence- I  Transferring/Ambulation- I  Managing Meds- I  Follow up appointments reviewed:  PCP Hospital f/u appt confirmed? No   Specialist Hospital f/u appt confirmed? No   Are transportation arrangements needed? No  If their condition worsens, is the pt aware to call PCP or go to the Emergency Dept.? Yes Was the patient provided with contact information for the PCP's office or ED? Yes Was to pt encouraged to call back with questions or concerns? Yes

## 2022-06-30 ENCOUNTER — Telehealth: Payer: Self-pay | Admitting: General Practice

## 2022-06-30 NOTE — Telephone Encounter (Signed)
Transition Care Management Follow-up Telephone Call Date of discharge and from where: 06/28/22 from [facility] How have you been since you were released from the hospital? Patient is at the nursing facility. Any questions or concerns? No

## 2022-08-31 ENCOUNTER — Telehealth: Payer: Self-pay

## 2022-08-31 NOTE — Transitions of Care (Post Inpatient/ED Visit) (Signed)
   08/31/2022  Name: Christy Wade MRN: [id number] DOB: Dec 01, 1935  Today's TOC FU Call Status:    Transition Care Management Follow-up Telephone Call Discharge Facility: [facility] Type of Discharge: Inpatient Admission Primary Inpatient Discharge Diagnosis:: altered mental status How have you been since you were released from the hospital?: Better Any questions or concerns?: No  Items Reviewed: Did you receive and understand the discharge instructions provided?: Yes Medications obtained and verified?: Yes (Medications Reviewed) Any new allergies since your discharge?: No Dietary orders reviewed?: NA Do you have support at home?: Yes People in Home: facility resident  Home Care and Equipment/Supplies: [place] Ordered?: NA Any new equipment or medical supplies ordered?: NA  Functional Questionnaire: Do you need assistance with bathing/showering or dressing?: Yes Do you need assistance with meal preparation?: No Do you need assistance with eating?: No Do you have difficulty maintaining continence: Yes Do you need assistance with getting out of bed/getting out of a chair/moving?: No Do you have difficulty managing or taking your medications?: Yes  Folllow up appointments reviewed: PCP Follow-up appointment confirmed?: NA (patient is in [place] see PCP at facility) [facility] Follow-up appointment confirmed?: NA Do you need transportation to your follow-up appointment?: No Do you understand care options if your condition(s) worsen?: Yes-patient verbalized understanding    [facility] [place], [place] Nurse Health Advisor Direct Dial [phone]

## 2023-03-02 ENCOUNTER — Encounter (HOSPITAL_BASED_OUTPATIENT_CLINIC_OR_DEPARTMENT_OTHER): Payer: Self-pay | Admitting: Emergency Medicine

## 2023-03-02 ENCOUNTER — Other Ambulatory Visit: Payer: Self-pay

## 2023-03-02 ENCOUNTER — Inpatient Hospital Stay (HOSPITAL_BASED_OUTPATIENT_CLINIC_OR_DEPARTMENT_OTHER)
Admission: EM | Admit: 2023-03-02 | Discharge: 2023-03-04 | DRG: 690 | Disposition: A | Discharge status: Home Health | Payer: Medicare PPO | Source: Skilled Nursing Facility | Attending: Internal Medicine | Admitting: Internal Medicine

## 2023-03-02 DIAGNOSIS — M810 Age-related osteoporosis without current pathological fracture: Secondary | ICD-10-CM | POA: Diagnosis present

## 2023-03-02 DIAGNOSIS — I5032 Chronic diastolic (congestive) heart failure: Secondary | ICD-10-CM | POA: Diagnosis present

## 2023-03-02 DIAGNOSIS — E861 Hypovolemia: Secondary | ICD-10-CM | POA: Diagnosis present

## 2023-03-02 DIAGNOSIS — Z90710 Acquired absence of both cervix and uterus: Secondary | ICD-10-CM | POA: Diagnosis not present

## 2023-03-02 DIAGNOSIS — N3000 Acute cystitis without hematuria: Principal | ICD-10-CM | POA: Diagnosis present

## 2023-03-02 DIAGNOSIS — B961 Klebsiella pneumoniae [K. pneumoniae] as the cause of diseases classified elsewhere: Secondary | ICD-10-CM | POA: Diagnosis present

## 2023-03-02 DIAGNOSIS — Z66 Do not resuscitate: Secondary | ICD-10-CM | POA: Diagnosis present

## 2023-03-02 DIAGNOSIS — Z79890 Hormone replacement therapy (postmenopausal): Secondary | ICD-10-CM

## 2023-03-02 DIAGNOSIS — D72829 Elevated white blood cell count, unspecified: Secondary | ICD-10-CM

## 2023-03-02 DIAGNOSIS — N390 Urinary tract infection, site not specified: Secondary | ICD-10-CM | POA: Diagnosis not present

## 2023-03-02 DIAGNOSIS — N179 Acute kidney failure, unspecified: Secondary | ICD-10-CM | POA: Diagnosis present

## 2023-03-02 DIAGNOSIS — Z882 Allergy status to sulfonamides status: Secondary | ICD-10-CM | POA: Diagnosis not present

## 2023-03-02 DIAGNOSIS — E871 Hypo-osmolality and hyponatremia: Secondary | ICD-10-CM | POA: Diagnosis present

## 2023-03-02 DIAGNOSIS — Z833 Family history of diabetes mellitus: Secondary | ICD-10-CM | POA: Diagnosis not present

## 2023-03-02 DIAGNOSIS — Z885 Allergy status to narcotic agent status: Secondary | ICD-10-CM | POA: Diagnosis not present

## 2023-03-02 DIAGNOSIS — I420 Dilated cardiomyopathy: Secondary | ICD-10-CM | POA: Diagnosis present

## 2023-03-02 DIAGNOSIS — Z79899 Other long term (current) drug therapy: Secondary | ICD-10-CM

## 2023-03-02 DIAGNOSIS — I130 Hypertensive heart and chronic kidney disease with heart failure and stage 1 through stage 4 chronic kidney disease, or unspecified chronic kidney disease: Secondary | ICD-10-CM | POA: Diagnosis present

## 2023-03-02 DIAGNOSIS — N189 Chronic kidney disease, unspecified: Secondary | ICD-10-CM | POA: Diagnosis present

## 2023-03-02 DIAGNOSIS — K219 Gastro-esophageal reflux disease without esophagitis: Secondary | ICD-10-CM | POA: Diagnosis present

## 2023-03-02 DIAGNOSIS — Z823 Family history of stroke: Secondary | ICD-10-CM | POA: Diagnosis not present

## 2023-03-02 DIAGNOSIS — Z9181 History of falling: Secondary | ICD-10-CM

## 2023-03-02 DIAGNOSIS — Z803 Family history of malignant neoplasm of breast: Secondary | ICD-10-CM | POA: Diagnosis not present

## 2023-03-02 DIAGNOSIS — E875 Hyperkalemia: Secondary | ICD-10-CM | POA: Diagnosis present

## 2023-03-02 DIAGNOSIS — E039 Hypothyroidism, unspecified: Secondary | ICD-10-CM | POA: Diagnosis present

## 2023-03-02 DIAGNOSIS — E860 Dehydration: Secondary | ICD-10-CM | POA: Diagnosis present

## 2023-03-02 DIAGNOSIS — I10 Essential (primary) hypertension: Secondary | ICD-10-CM | POA: Diagnosis present

## 2023-03-02 DIAGNOSIS — R110 Nausea: Secondary | ICD-10-CM | POA: Diagnosis present

## 2023-03-02 DIAGNOSIS — R531 Weakness: Secondary | ICD-10-CM

## 2023-03-02 DIAGNOSIS — R195 Other fecal abnormalities: Secondary | ICD-10-CM | POA: Diagnosis present

## 2023-03-02 LAB — LACTIC ACID, PLASMA
Lactic Acid, Venous: 1.2 mmol/L (ref 0.5–1.9)
Lactic Acid, Venous: 1.1 mmol/L (ref 0.5–1.9)

## 2023-03-02 LAB — URINALYSIS, W/ REFLEX TO CULTURE (INFECTION SUSPECTED)
Bilirubin Urine: NEGATIVE
Glucose, UA: NEGATIVE mg/dL
Ketones, ur: NEGATIVE mg/dL
Nitrite: NEGATIVE
Protein, ur: NEGATIVE mg/dL
Specific Gravity, Urine: 1.01 (ref 1.005–1.030)
pH: 5.5 (ref 5.0–8.0)

## 2023-03-02 LAB — CBC
HCT: 34.9 % — ABNORMAL LOW (ref 36.0–46.0)
Hemoglobin: 11.8 g/dL — ABNORMAL LOW (ref 12.0–15.0)
MCH: 30.3 pg (ref 26.0–34.0)
MCHC: 33.8 g/dL (ref 30.0–36.0)
MCV: 89.5 fL (ref 80.0–100.0)
Platelets: 299 10*3/uL (ref 150–400)
RBC: 3.9 MIL/uL (ref 3.87–5.11)
RDW: 14 % (ref 11.5–15.5)
WBC: 19.3 10*3/uL — ABNORMAL HIGH (ref 4.0–10.5)
nRBC: 0 % (ref 0.0–0.2)

## 2023-03-02 LAB — COMPREHENSIVE METABOLIC PANEL
ALT: 11 U/L (ref 0–44)
AST: 16 U/L (ref 15–41)
Albumin: 3.3 g/dL — ABNORMAL LOW (ref 3.5–5.0)
Alkaline Phosphatase: 120 U/L (ref 38–126)
Anion gap: 14 (ref 5–15)
BUN: 43 mg/dL — ABNORMAL HIGH (ref 8–23)
CO2: 18 mmol/L — ABNORMAL LOW (ref 22–32)
Calcium: 8.1 mg/dL — ABNORMAL LOW (ref 8.9–10.3)
Chloride: 93 mmol/L — ABNORMAL LOW (ref 98–111)
Creatinine, Ser: 2.62 mg/dL — ABNORMAL HIGH (ref 0.44–1.00)
GFR, Estimated: 17 mL/min — ABNORMAL LOW (ref >60)
Glucose, Bld: 91 mg/dL (ref 70–99)
Potassium: 5.5 mmol/L — ABNORMAL HIGH (ref 3.5–5.1)
Sodium: 125 mmol/L — ABNORMAL LOW (ref 135–145)
Total Bilirubin: 0.7 mg/dL (ref 0.3–1.2)
Total Protein: 6.6 g/dL (ref 6.5–8.1)

## 2023-03-02 LAB — LIPASE, BLOOD: Lipase: 23 U/L (ref 11–51)

## 2023-03-02 MED ORDER — SODIUM CHLORIDE 0.9 % IV SOLN
1.0000 g | Freq: Once | INTRAVENOUS | Status: AC
  Administered 2023-03-02: 1 g via INTRAVENOUS
  Filled 2023-03-02: qty 10

## 2023-03-02 MED ORDER — ACETAMINOPHEN 500 MG PO TABS
1000.0000 mg | ORAL_TABLET | Freq: Once | ORAL | Status: AC
  Administered 2023-03-02: 500 mg via ORAL
  Filled 2023-03-02: qty 2

## 2023-03-02 MED ORDER — SODIUM ZIRCONIUM CYCLOSILICATE 10 G PO PACK
10.0000 g | PACK | Freq: Once | ORAL | Status: AC
  Administered 2023-03-02: 10 g via ORAL
  Filled 2023-03-02: qty 1

## 2023-03-02 MED ORDER — SODIUM CHLORIDE 0.9 % IV BOLUS
1000.0000 mL | Freq: Once | INTRAVENOUS | Status: AC
  Administered 2023-03-02: 1000 mL via INTRAVENOUS

## 2023-03-02 NOTE — ED Notes (Signed)
[facility] called for transport @21 :32 No ETA

## 2023-03-02 NOTE — ED Notes (Signed)
Attempted to call report, was told the nurse would call back in 5 minutes

## 2023-03-02 NOTE — ED Triage Notes (Signed)
Pt from [facility] assisted living with worries of UTI. Endorses nausea, diarrhea, lack of appetite. Family reports this is her normal presentation for UTI. Family reports the facility collected urine Sunday but still has not been picked up for lab.

## 2023-03-02 NOTE — ED Provider Notes (Signed)
[place] [facility] Provider Note   CSN: [id number] Arrival date & time: 03/02/23  1755     History {Add pertinent medical, surgical, social history, OB history to HPI:1} Chief Complaint  Patient presents with   Urinary Tract Infection    Christy Wade is a 87 y.o. female.  HPI     Has had urinary frequency since Sunday, has been in the dentist office for 2 days not drinking as much as typically does No falls Nausea felt like going to throw up but did not At [facility], talked about checking urine but was not able to get it sent off Feeling very weak Since Sunday has had diarrhea, stool is dark chronically from iron, not change No cough, dyspnea or chest pain Feeling very weak/fatigue  Past Medical History:  Diagnosis Date   Anemia    on iron now   Cataract    Hypertension    Hypothyroidism    Osteoporosis    PONV (postoperative nausea and vomiting)    Thyroid disease    Varicose vein      Home Medications Prior to Admission medications   Medication Sig Start Date End Date Taking? Authorizing Provider  acetaminophen (TYLENOL) 500 MG tablet Take 1,000 mg by mouth every 8 (eight) hours as needed (for pain).    [provider]  Calcium Carbonate-Vit D-Min (CALCIUM 600+D PLUS MINERALS PO) Take 1 tablet by mouth daily.    [provider]  Cranberry 450 MG TABS Take 450 mg by mouth daily.    [provider]  cycloSPORINE (RESTASIS) 0.05 % ophthalmic emulsion Place 1 drop into both eyes 2 (two) times daily. 04/24/20   Ngetich, Dinah C, NP  Eyelid Cleansers (OCUSOFT LID SCRUB ORIGINAL) PADS Place 1 Pad into both eyes 2 (two) times daily.    [provider]  iron polysaccharides (NIFEREX) 150 MG capsule Take 1 capsule (150 mg total) by mouth daily. 05/14/22 08/12/22  Narda Bonds, MD  levothyroxine (SYNTHROID) 75 MCG tablet Take 75 mcg by mouth daily before breakfast.    [provider]  loperamide (IMODIUM A-D) 2 MG tablet Take 2 mg by mouth every 6 (six) hours as needed for diarrhea or loose stools.    [provider]  Menthol, Topical Analgesic, (BIOFREEZE) 4 % GEL Apply 3 oz topically 3 (three) times daily as needed. 04/24/20   Ngetich, Dinah C, NP  metoprolol succinate (TOPROL-XL) 50 MG 24 hr tablet Take 1 tablet (50 mg total) by mouth daily. Take with or immediately following a meal. 06/18/20   Gherghe, Daylene Katayama, MD  Nutritional Supplements (,FEEDING SUPPLEMENT, PROSOURCE PLUS) liquid Take 30 mLs by mouth daily. 06/19/20   Aquilla Hacker, MD  nystatin (MYCOSTATIN/NYSTOP) powder Apply 1 Application topically every 8 (eight) hours as needed (for rashes or redness- affected areas).    [provider]  pantoprazole (PROTONIX) 40 MG tablet Take 1 tablet (40 mg total) by mouth daily. 05/12/20   Everrett Coombe, DO  REFRESH RELIEVA PF 0.5-1 % SOLN Place 1 drop into both eyes 4 (four) times daily.    [provider]  sacubitril-valsartan (ENTRESTO) 24-26 MG Take 1 tablet by mouth 2 (two) times daily. Please check BP prior to administration and hold if MAP is less than 65 Patient taking differently: Take 1 tablet by mouth 2 (two) times daily. 06/19/20   Aquilla Hacker, MD  simethicone (MYLICON) 125 MG chewable tablet Chew 125 mg  by mouth 3 (three) times daily.    [provider]  TUMS 500 MG chewable tablet Chew 1 tablet by mouth 3 (three) times daily.    [provider]  VOLTAREN 1 % GEL Apply 2 g topically every 8 (eight) hours as needed (for pain- affected areas of shoulders, back, and right elbow).    [provider]  Zinc Oxide (BAZA PROTECT MOISTURE BARRIER EX) Apply 1 application  topically every 6 (six) hours as needed (for redness- affected sites).    [provider]      Allergies    Codeine and Sulfamethoxazole    Review of Systems   Review of Systems  Physical Exam Updated Vital Signs BP  132/68 (BP Location: Left Arm)   Pulse 92   Temp 98.2 F (36.8 C)   Resp 20   Ht 5\' 9"  (1.753 m)   Wt 73 kg   SpO2 100%   BMI 23.77 kg/m  Physical Exam  ED Results / Procedures / Treatments   Labs (all labs ordered are listed, but only abnormal results are displayed) Labs Reviewed  URINALYSIS, W/ REFLEX TO CULTURE (INFECTION SUSPECTED) - Abnormal; Notable for the following components:      Result Value   Hgb urine dipstick SMALL (*)    Leukocytes,Ua MODERATE (*)    Bacteria, UA MANY (*)    All other components within normal limits  COMPREHENSIVE METABOLIC PANEL - Abnormal; Notable for the following components:   Sodium 125 (*)    Potassium 5.5 (*)    Chloride 93 (*)    CO2 18 (*)    BUN 43 (*)    Creatinine, Ser 2.62 (*)    Calcium 8.1 (*)    Albumin 3.3 (*)    GFR, Estimated 17 (*)    All other components within normal limits  CBC - Abnormal; Notable for the following components:   WBC 19.3 (*)    Hemoglobin 11.8 (*)    HCT 34.9 (*)    All other components within normal limits  URINE CULTURE  LIPASE, BLOOD    EKG None  Radiology No results found.  Procedures Procedures  {Document cardiac monitor, telemetry assessment procedure when appropriate:1}  Medications Ordered in ED Medications - No data to display  ED Course/ Medical Decision Making/ A&P   {   Click here for ABCD2, HEART and other calculatorsREFRESH Note before signing :1}                              Medical Decision Making Amount and/or Complexity of Data Reviewed Labs: ordered.   ***   Labs completed and personally read interpreted by me show urinalysis consistent with UTI with white blood cells and many bacteria.  Lipase within normal limits, CMP with creatinine of 2.62 from previous of 1.4, BUN of 43, potassium of 5.5, bicarb of 18, sodium of 125 from previous values of 130 and 128.  CBC significant for a leukocytosis of 19,000.  {Document critical care time when  appropriate:1} {Document review of labs and clinical decision tools ie heart score, Chads2Vasc2 etc:1}  {Document your independent review of radiology images, and any outside records:1} {Document your discussion with family members, caretakers, and with consultants:1} {Document social determinants of health affecting pt's care:1} {Document your decision making why or why not admission, treatments were needed:1} Final Clinical Impression(s) / ED Diagnoses Final diagnoses:  None    Rx / DC  Orders ED Discharge Orders     None

## 2023-03-02 NOTE — Progress Notes (Addendum)
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  Patient has nausea. She is from [facility] assisted living facility.  Hemodynamically stable initial presentation.  UA showed evidence of UTI. CMP evidence of elevated potassium 5.5, no sodium 125 and evidence of AKI 2.62. In the ED patient has been resuscitated with 1 L of normal saline bolus and started on ceftriaxone.   Hospitalist to be has been consulted to admit patient for the management of UTI, AKI, hyperkalemia and acute on chronic hyponatremia.  Plan of care: The patient is accepted for admission to Progressive unit, at [facility].  Check [email or web address] for on-call coverage.  TRH will assume care on arrival to accepting facility. Until arrival, medical decision making responsibilities remain with the EDP.  However, TRH available 24/7 for questions and assistance.   Nursing staff please page [facility] Admits and Consults [phone]) as soon as the patient arrives to the hospital.    Author: Tereasa Coop, MD  03/02/2023  [facility]

## 2023-03-03 ENCOUNTER — Inpatient Hospital Stay (HOSPITAL_COMMUNITY): Payer: Medicare PPO

## 2023-03-03 DIAGNOSIS — N3000 Acute cystitis without hematuria: Secondary | ICD-10-CM

## 2023-03-03 DIAGNOSIS — N179 Acute kidney failure, unspecified: Secondary | ICD-10-CM | POA: Diagnosis not present

## 2023-03-03 DIAGNOSIS — I420 Dilated cardiomyopathy: Secondary | ICD-10-CM

## 2023-03-03 DIAGNOSIS — E875 Hyperkalemia: Secondary | ICD-10-CM | POA: Diagnosis present

## 2023-03-03 DIAGNOSIS — E871 Hypo-osmolality and hyponatremia: Secondary | ICD-10-CM

## 2023-03-03 DIAGNOSIS — D72829 Elevated white blood cell count, unspecified: Secondary | ICD-10-CM

## 2023-03-03 DIAGNOSIS — I10 Essential (primary) hypertension: Secondary | ICD-10-CM

## 2023-03-03 LAB — BASIC METABOLIC PANEL
Anion gap: 8 (ref 5–15)
BUN: 41 mg/dL — ABNORMAL HIGH (ref 8–23)
CO2: 20 mmol/L — ABNORMAL LOW (ref 22–32)
Calcium: 7.6 mg/dL — ABNORMAL LOW (ref 8.9–10.3)
Chloride: 98 mmol/L (ref 98–111)
Creatinine, Ser: 2.42 mg/dL — ABNORMAL HIGH (ref 0.44–1.00)
GFR, Estimated: 19 mL/min — ABNORMAL LOW (ref >60)
Glucose, Bld: 94 mg/dL (ref 70–99)
Potassium: 4.6 mmol/L (ref 3.5–5.1)
Sodium: 126 mmol/L — ABNORMAL LOW (ref 135–145)

## 2023-03-03 LAB — MRSA NEXT GEN BY PCR, NASAL: MRSA by PCR Next Gen: DETECTED — AB

## 2023-03-03 LAB — CBC
HCT: 30.8 % — ABNORMAL LOW (ref 36.0–46.0)
Hemoglobin: 10.1 g/dL — ABNORMAL LOW (ref 12.0–15.0)
MCH: 29.7 pg (ref 26.0–34.0)
MCHC: 32.8 g/dL (ref 30.0–36.0)
MCV: 90.6 fL (ref 80.0–100.0)
Platelets: 209 10*3/uL (ref 150–400)
RBC: 3.4 MIL/uL — ABNORMAL LOW (ref 3.87–5.11)
RDW: 13.8 % (ref 11.5–15.5)
WBC: 12.8 10*3/uL — ABNORMAL HIGH (ref 4.0–10.5)
nRBC: 0 % (ref 0.0–0.2)

## 2023-03-03 MED ORDER — ENSURE ENLIVE PO LIQD
237.0000 mL | Freq: Two times a day (BID) | ORAL | Status: DC
  Administered 2023-03-03 – 2023-03-04 (×3): 237 mL via ORAL

## 2023-03-03 MED ORDER — ONDANSETRON HCL 4 MG PO TABS: 4.0000 mg | ORAL_TABLET | Freq: Four times a day (QID) | ORAL | Status: DC | PRN

## 2023-03-03 MED ORDER — ACETAMINOPHEN 325 MG PO TABS
650.0000 mg | ORAL_TABLET | Freq: Four times a day (QID) | ORAL | Status: DC | PRN
  Administered 2023-03-03 (×2): 650 mg via ORAL
  Filled 2023-03-03 (×2): qty 2

## 2023-03-03 MED ORDER — ENOXAPARIN SODIUM 30 MG/0.3ML IJ SOSY
30.0000 mg | PREFILLED_SYRINGE | INTRAMUSCULAR | Status: DC
  Administered 2023-03-03 – 2023-03-04 (×2): 30 mg via SUBCUTANEOUS
  Filled 2023-03-03 (×2): qty 0.3

## 2023-03-03 MED ORDER — ACETAMINOPHEN 650 MG RE SUPP: 650.0000 mg | Freq: Four times a day (QID) | RECTAL | Status: DC | PRN

## 2023-03-03 MED ORDER — SODIUM CHLORIDE 0.9 % IV SOLN
1.0000 g | INTRAVENOUS | Status: DC
Start: 2023-03-03 — End: 2023-03-03

## 2023-03-03 MED ORDER — ORAL CARE MOUTH RINSE: 15.0000 mL | OROMUCOSAL | Status: DC | PRN

## 2023-03-03 MED ORDER — ONDANSETRON HCL 4 MG/2ML IJ SOLN: 4.0000 mg | Freq: Four times a day (QID) | INTRAMUSCULAR | Status: DC | PRN

## 2023-03-03 MED ORDER — SODIUM CHLORIDE 0.9 % IV SOLN
1.0000 g | INTRAVENOUS | Status: DC
  Administered 2023-03-03: 1 g via INTRAVENOUS
  Filled 2023-03-03 (×2): qty 10

## 2023-03-03 MED ORDER — SODIUM CHLORIDE 0.9 % IV SOLN: INTRAVENOUS | Status: DC

## 2023-03-03 NOTE — Progress Notes (Signed)
Patient admitted after midnight by my partner.  Patient lives at assisted living facility in [place].  Anxious to go back.  She is admitted with urinary tract infection failed outpatient treatment.  Urine culture pending.  Continue Rocephin.  Labs reviewed.  She had leukocytosis on admission.  Denies nausea vomiting abdominal pain diarrhea.  If remains stable hope to discharge patient tomorrow.

## 2023-03-03 NOTE — TOC Initial Note (Addendum)
Transition of Care [facility]) - Initial/Assessment Note    Patient Details  Name: Christy Wade MRN: [id number] Date of Birth: 12/27/1935  Transition of Care [facility]) CM/SW Contact:    Lanier Clam, RN Phone Number: 03/03/2023, 10:22 AM  Clinical Narrative:  From [facility]-patient plans to return. [phone]-[phone] to reach anyone-[facility] another sister [facility]-spoke nurse she will contact the rep there to call me back.Will provide PTAR for return. Facility has own inhouse PT/OT if needed.  -11:35a Spoke to [employer] coordinator @ [phone]-confirmed patient from there await return. Faxed fl2 to [phone]. Facility uses [facility] for HHPT/OT. PTAR @ d/c.               Expected Discharge Plan: Home w Home Health Services Barriers to Discharge: Continued Medical Work up   Patient Goals and CMS Choice Patient states their goals for this hospitalization and ongoing recovery are:: Return [facility] [address] [email or web address] Compare Post Acute Care list provided to:: Patient Choice offered to / list presented to : Patient [facility] ownership interest in [facility].provided to:: Patient    Expected Discharge Plan and Services   Discharge Planning Services: CM Consult   Living arrangements for the past 2 months: Assisted Living Facility                                      Prior Living Arrangements/Services Living arrangements for the past 2 months: Assisted Living Facility Lives with:: Facility Resident Patient language and need for interpreter reviewed:: Yes Do you feel safe going back to the place where you live?: Yes      Need for Family Participation in Patient Care: Yes (Comment) Care giver support system in place?: Yes (comment) Current home services: DME (rw,w/c, chair lift) Criminal Activity/Legal Involvement Pertinent to Current Situation/Hospitalization: No - Comment as  needed  Activities of Daily Living Home Assistive Devices/Equipment: Wheelchair ADL Screening (condition at time of admission) Patient's cognitive ability adequate to safely complete daily activities?: Yes Is the patient deaf or have difficulty hearing?: No Does the patient have difficulty seeing, even when wearing glasses/contacts?: No Does the patient have difficulty concentrating, remembering, or making decisions?: No Patient able to express need for assistance with ADLs?: Yes Does the patient have difficulty dressing or bathing?: Yes Independently performs ADLs?: No Communication: Independent Dressing (OT): Needs assistance Is this a change from baseline?: Pre-admission baseline Grooming: Needs assistance Is this a change from baseline?: Pre-admission baseline Feeding: Independent Bathing: Needs assistance Is this a change from baseline?: Pre-admission baseline Toileting: Needs assistance Is this a change from baseline?: Pre-admission baseline In/Out Bed: Needs assistance Is this a change from baseline?: Pre-admission baseline Walks in Home: Needs assistance Is this a change from baseline?: Pre-admission baseline Does the patient have difficulty walking or climbing stairs?: Yes Weakness of Legs: Both Weakness of Arms/Hands: Right  Permission Sought/Granted Permission sought to share information with : Case Manager Permission granted to share information with : Yes, Verbal Permission Granted  Share Information with NAME: Case manager           Emotional Assessment Appearance:: Appears stated age Attitude/Demeanor/Rapport: Gracious Affect (typically observed): Accepting Orientation: : Oriented to Self, Oriented to Place, Oriented to  Time, Oriented to Situation Alcohol / Substance Use: Not Applicable Psych Involvement: No (comment)  Admission diagnosis:  Acute cystitis [N30.00] Patient Active Problem List   Diagnosis Date Noted   Hyperkalemia 03/03/2023   Acute  cystitis 03/02/2023   GERD (gastroesophageal reflux disease) 05/13/2022   AKI (acute kidney injury) (HCC) 05/13/2022   SOB (shortness of breath)    Atrial tachycardia    Acute combined systolic and diastolic heart failure (HCC)    DCM (dilated cardiomyopathy) (HCC)    Hallucinations 06/10/2020   Sepsis (HCC) 06/10/2020   Hypoxia 06/10/2020   Protein-calorie malnutrition (HCC) 06/02/2020   Recurrent UTI 05/27/2020   GAD (generalized anxiety disorder) 05/14/2020   Hypokalemia 05/14/2020   Right shoulder pain 05/14/2020   Loose stools 05/09/2020   Dysphagia 05/09/2020   Hemorrhoids 05/09/2020   Fatigue 05/05/2020   CHF (congestive heart failure) (HCC) 03/25/2020   Respiratory failure (HCC) 03/25/2020   Multiple fractures of ribs, right side, subsequent encounter for fracture with routine healing 03/06/2020   Hyponatremia 03/05/2020   Diarrhea 07/24/2018   Vaginitis and vulvovaginitis 05/22/2016   Well adult exam 10/01/2015   Chronic chest wall pain 09/09/2014   Mitral valve regurgitation 06/21/2012   Essential hypertension 01/03/2008   Osteopenia 03/28/2007   Hypothyroidism 02/16/2007   PCP:  No primary care provider on file. Pharmacy:   [facility] - [place], [place] - [address] [place] [address] Phone: [phone] Fax: [phone]     Social Determinants of Health (SDOH) Social History: SDOH Screenings   Food Insecurity: No Food Insecurity (03/02/2023)  Housing: Low Risk  (03/02/2023)  Transportation Needs: No Transportation Needs (03/02/2023)  Utilities: Not At Risk (03/02/2023)  Alcohol Screen: Low Risk  (01/02/2020)  Depression (PHQ2-9): Low Risk  (01/02/2020)  Financial Resource Strain: Low Risk  (01/02/2020)  Physical Activity: Inactive (01/02/2020)  Social Connections: Moderately Integrated (01/02/2020)  Stress: No Stress Concern Present (01/02/2020)  Tobacco Use: Low Risk  (03/02/2023)   SDOH  Interventions:     Readmission Risk Interventions    05/11/2022   10:58 AM  Readmission Risk Prevention Plan  Transportation Screening Complete  PCP or Specialist Appt within 5-7 Days Complete  Home Care Screening Complete  Medication Review (RN CM) Complete

## 2023-03-03 NOTE — Assessment & Plan Note (Signed)
H/o reduced EF in past, but looks like nl EF on 2d echo in Oct 2023. Cont BB Hold entresto given AKI.

## 2023-03-03 NOTE — Assessment & Plan Note (Signed)
Cont BB Hold Entresto

## 2023-03-03 NOTE — Assessment & Plan Note (Signed)
Presumably hypovolemic hyponatremia in setting of AKI IVF: 1L bolus in ED and 100cc/hr NS ongoing. Repeat BMP in AM

## 2023-03-03 NOTE — Assessment & Plan Note (Signed)
  UCx and BCx pending IVF Repeat CBC in AM to trend WBC H/o ESBL according to Epic flag (Sept of 2023 it looks like?) However, last 2 UCx that I can see included a Rocephin sensitive (non-ESBL) e.coli in Oct 2023 here at [facility] As well as a mostly pan-sensitive K.Pneumo in Feb 2024 at [facility] Empiric rocephin for the moment But if pt worsens: would escalate to carbapenem

## 2023-03-03 NOTE — Assessment & Plan Note (Signed)
Presumably pre-renal in setting of UTI IVF: 1L bolus in ED and NS at 100 cc/hr Strict intake and output Repeat BMP in AM Getting renal US to r/o obstructive uropathy Hold entresto

## 2023-03-03 NOTE — Evaluation (Signed)
Physical Therapy Evaluation Patient Details Name: Christy Wade MRN: [id number] DOB: Mar 10, 1936 Today's Date: 03/03/2023  History of Present Illness  87 y.o. female with medical history significant of R proximal humerus fx October 2023,  HTN, hypothyroidism, chronic CHF with recovered EF as of Oct 2023.     Pt in to ED from ALF with nausea and poor appetite for past 2 days.     UA showing evidence of UTI and BMP showing AKI  Clinical Impression  Pt admitted with above diagnosis. Pt ambulated 36' with RW, no loss of balance, distance limited by bleeding at L forearm IV site (RN notified). Pt reports she self propels a WC with her feet, and walks with a rollator at her ALF at baseline. Good progress expected.  Pt currently with functional limitations due to the deficits listed below (see PT Problem List). Pt will benefit from acute skilled PT to increase their independence and safety with mobility to allow discharge.           If plan is discharge home, recommend the following: A little help with walking and/or transfers;A little help with bathing/dressing/bathroom;Assistance with cooking/housework;Assist for transportation;Help with stairs or ramp for entrance   Can travel by private vehicle        Equipment Recommendations None recommended by PT  Recommendations for Other Services       Functional Status Assessment Patient has had a recent decline in their functional status and demonstrates the ability to make significant improvements in function in a reasonable and predictable amount of time.     Precautions / Restrictions Precautions Precautions: Fall Precaution Comments: h/o R proximal humerus fx 2023 Restrictions Weight Bearing Restrictions: No      Mobility  Bed Mobility Overal bed mobility: Modified Independent             General bed mobility comments: HOB up, used rail    Transfers Overall transfer level: Needs assistance Equipment used: Rolling walker (2  wheels) Transfers: Sit to/from Stand Sit to Stand: Contact guard assist           General transfer comment: CGA for safety    Ambulation/Gait Ambulation/Gait assistance: Contact guard assist Gait Distance (Feet): 70 Feet Assistive device: Rolling walker (2 wheels) Gait Pattern/deviations: Step-through pattern, Trunk flexed Gait velocity: WFL     General Gait Details: steady, no loss of balance, distance limited by bleeding at IV site, RN notified  Stairs            Wheelchair Mobility     Tilt Bed    Modified Rankin (Stroke Patients Only)       Balance Overall balance assessment: History of Falls, Needs assistance Sitting-balance support: Feet supported, No upper extremity supported Sitting balance-Leahy Scale: Good     Standing balance support: Bilateral upper extremity supported, During functional activity, Reliant on assistive device for balance Standing balance-Leahy Scale: Fair                               Pertinent Vitals/Pain Pain Assessment Pain Assessment: No/denies pain    Home Living Family/patient expects to be discharged to:: Assisted living                 Home Equipment: Rollator (4 wheels);Wheelchair - manual Additional Comments: patient reported having a wheelchair at facility.    Prior Function Prior Level of Function : Needs assist  Mobility Comments: was ambulatory with RW prior to humeral fx last year , most recently using w/c for safety but does walk with a rollator at times during PT at ALF ADLs Comments: assist needed     Extremity/Trunk Assessment   Upper Extremity Assessment Upper Extremity Assessment: RUE deficits/detail;Defer to OT evaluation RUE Deficits / Details: R proximal humerus fx 2023, pt keeps RUE adducted and elbow flexed but could use RUE functionally with RW    Lower Extremity Assessment Lower Extremity Assessment: Overall WFL for tasks assessed    Cervical / Trunk  Assessment Cervical / Trunk Assessment: Kyphotic  Communication   Communication Communication: No apparent difficulties  Cognition Arousal: Alert Behavior During Therapy: WFL for tasks assessed/performed Overall Cognitive Status: No family/caregiver present to determine baseline cognitive functioning                                 General Comments: seems to have some short term memory loss as she repeats statements multiple times, very pleasant, follows commands well        General Comments      Exercises     Assessment/Plan    PT Assessment Patient needs continued PT services  PT Problem List Decreased activity tolerance;Decreased mobility       PT Treatment Interventions Gait training;Therapeutic activities;Therapeutic exercise;Balance training    PT Goals (Current goals can be found in the Care Plan section)  Acute Rehab PT Goals Patient Stated Goal: return to ALF, to walk PT Goal Formulation: With patient Time For Goal Achievement: 03/17/23 Potential to Achieve Goals: Good    Frequency Min 1X/week     Co-evaluation               AM-PAC PT "6 Clicks" Mobility  Outcome Measure Help needed turning from your back to your side while in a flat bed without using bedrails?: A Little Help needed moving from lying on your back to sitting on the side of a flat bed without using bedrails?: A Little Help needed moving to and from a bed to a chair (including a wheelchair)?: A Little Help needed standing up from a chair using your arms (e.g., wheelchair or bedside chair)?: A Little Help needed to walk in hospital room?: A Little Help needed climbing 3-5 steps with a railing? : A Lot 6 Click Score: 17    End of Session Equipment Utilized During Treatment: Gait belt Activity Tolerance: Patient tolerated treatment well Patient left: in chair;with call bell/phone within reach;with chair alarm set Nurse Communication: Mobility status PT Visit Diagnosis:  Other abnormalities of gait and mobility (R26.89);Unsteadiness on feet (R26.81)    Time: 1322-1340 PT Time Calculation (min) (ACUTE ONLY): 18 min   Charges:   PT Evaluation $PT Eval Moderate Complexity: 1 Mod   PT General Charges $$ ACUTE PT VISIT: 1 Visit         Tamala Ser PT 03/03/2023  Acute Rehabilitation Services  Office [phone]

## 2023-03-03 NOTE — Assessment & Plan Note (Signed)
Mild in setting of AKI Hold entresto Got [place] in ED Getting NS Tele monitor Repeat BMP in AM.

## 2023-03-03 NOTE — H&P (Signed)
History and Physical    Patient: Christy Wade ZHY:[id number] DOB: 09-07-1935 DOA: 03/02/2023 DOS: the patient was seen and examined on 03/03/2023 PCP: Everrett Coombe, DO  Patient coming from: ALF/ILF [facility]  Chief Complaint:  Chief Complaint  Patient presents with   Urinary Tract Infection   HPI: Christy Wade is a 87 y.o. female with medical history significant of HTN, hypothyroidism, chronic CHF with recovered EF as of Oct 2023.  Pt in to ED from ALF with nausea and poor appetite for past 2 days.  UA showing evidence of UTI and BMP showing AKI with K 5.5.  Also sodium 125, creat 2.62.  WBC 19k.  Pt started on rocephin and transferred to [facility] for admission.   Review of Systems: As mentioned in the history of present illness. All other systems reviewed and are negative. Past Medical History:  Diagnosis Date   Anemia    on iron now   Cataract    Hypertension    Hypothyroidism    Osteoporosis    PONV (postoperative nausea and vomiting)    Thyroid disease    Varicose vein    Past Surgical History:  Procedure Laterality Date   ABDOMINAL HYSTERECTOMY  1975   partial   APPENDECTOMY  1975   with hysterectomy   HEMORROIDECTOMY     [phone]   LAPAROSCOPIC SMALL BOWEL RESECTION N/A 11/29/2014   Procedure: LAPAROSCOPIC SMALL INTESTINE RESECTION;  Surgeon: Karie Soda, MD;  Location: [facility];  Service: General;  Laterality: N/A;   VARICOSE VEIN SURGERY Left 1971   Social History:  reports that she has never smoked. She has never used smokeless tobacco. She reports that she does not drink alcohol and does not use drugs.  Allergies  Allergen Reactions   Codeine Nausea Only   Sulfamethoxazole Rash and Other (See Comments)    Pt states "seems like I break out"    Family History  Problem Relation Age of Onset   Bone cancer Mother    Diabetes Father    Stroke Father    Breast cancer Sister    Stomach cancer Neg Hx    Colon cancer Neg Hx     Prior to  Admission medications   Medication Sig Start Date End Date Taking? Authorizing Provider  acetaminophen (TYLENOL) 500 MG tablet Take 1,000 mg by mouth every 8 (eight) hours as needed (for pain).    [provider]  Calcium Carbonate-Vit D-Min (CALCIUM 600+D PLUS MINERALS PO) Take 1 tablet by mouth daily.    [provider]  Cranberry 450 MG TABS Take 450 mg by mouth daily.    [provider]  cycloSPORINE (RESTASIS) 0.05 % ophthalmic emulsion Place 1 drop into both eyes 2 (two) times daily. 04/24/20   Ngetich, Dinah C, NP  Eyelid Cleansers (OCUSOFT LID SCRUB ORIGINAL) PADS Place 1 Pad into both eyes 2 (two) times daily.    [provider]  iron polysaccharides (NIFEREX) 150 MG capsule Take 1 capsule (150 mg total) by mouth daily. 05/14/22 08/12/22  Narda Bonds, MD  levothyroxine (SYNTHROID) 75 MCG tablet Take 75 mcg by mouth daily before breakfast.    [provider]  loperamide (IMODIUM A-D) 2 MG tablet Take 2 mg by mouth every 6 (six) hours as needed for diarrhea or loose stools.    [provider]  Menthol, Topical Analgesic, (BIOFREEZE) 4 % GEL Apply 3 oz topically 3 (three) times daily as needed. 04/24/20   Ngetich, Donalee Citrin, NP  metoprolol  succinate (TOPROL-XL) 50 MG 24 hr tablet Take 1 tablet (50 mg total) by mouth daily. Take with or immediately following a meal. 06/18/20   Gherghe, Daylene Katayama, MD  Nutritional Supplements (,FEEDING SUPPLEMENT, PROSOURCE PLUS) liquid Take 30 mLs by mouth daily. 06/19/20   Aquilla Hacker, MD  nystatin (MYCOSTATIN/NYSTOP) powder Apply 1 Application topically every 8 (eight) hours as needed (for rashes or redness- affected areas).    [provider]  pantoprazole (PROTONIX) 40 MG tablet Take 1 tablet (40 mg total) by mouth daily. 05/12/20   Everrett Coombe, DO  REFRESH RELIEVA PF 0.5-1 % SOLN Place 1 drop into both eyes 4 (four) times daily.    [provider]  sacubitril-valsartan (ENTRESTO)  24-26 MG Take 1 tablet by mouth 2 (two) times daily. Please check BP prior to administration and hold if MAP is less than 65 Patient taking differently: Take 1 tablet by mouth 2 (two) times daily. 06/19/20   Aquilla Hacker, MD  simethicone (MYLICON) 125 MG chewable tablet Chew 125 mg by mouth 3 (three) times daily.    [provider]  TUMS 500 MG chewable tablet Chew 1 tablet by mouth 3 (three) times daily.    [provider]  VOLTAREN 1 % GEL Apply 2 g topically every 8 (eight) hours as needed (for pain- affected areas of shoulders, back, and right elbow).    [provider]  Zinc Oxide (BAZA PROTECT MOISTURE BARRIER EX) Apply 1 application  topically every 6 (six) hours as needed (for redness- affected sites).    [provider]    Physical Exam: Vitals:   03/02/23 1803 03/02/23 2200 03/02/23 2205 03/02/23 2253  BP: 132/68 135/88  135/83  Pulse: 92 (!) 106  98  Resp: 20 15  18   Temp: 98.2 F (36.8 C)  98.6 F (37 C) 98.1 F (36.7 C)  TempSrc:   Oral Oral  SpO2: 100% 93%  99%  Weight:      Height:       Constitutional: NAD, calm, comfortable Respiratory: clear to auscultation bilaterally, no wheezing, no crackles. Normal respiratory effort. No accessory muscle use.  Cardiovascular: Regular rate and rhythm, no murmurs / rubs / gallops. No extremity edema. 2+ pedal pulses. No carotid bruits.  Abdomen: no tenderness, no masses palpated. No hepatosplenomegaly. Bowel sounds positive. Neurologic: CN 2-12 grossly intact. Sensation intact, DTR normal. Strength 5/5 in all 4.  Psychiatric: Normal judgment and insight. Alert and oriented x 3. Normal mood.   Data Reviewed:    Labs on Admission: I have personally reviewed following labs and imaging studies  CBC: Recent Labs  Lab 03/02/23 1805  WBC 19.3*  HGB 11.8*  HCT 34.9*  MCV 89.5  PLT 299   Basic Metabolic Panel: Recent Labs  Lab 03/02/23 1805  NA 125*  K 5.5*  CL 93*  CO2 18*   GLUCOSE 91  BUN 43*  CREATININE 2.62*  CALCIUM 8.1*   GFR: Estimated Creatinine Clearance: 16.7 mL/min (A) (by C-G formula based on SCr of 2.62 mg/dL (H)). Liver Function Tests: Recent Labs  Lab 03/02/23 1805  AST 16  ALT 11  ALKPHOS 120  BILITOT 0.7  PROT 6.6  ALBUMIN 3.3*   Recent Labs  Lab 03/02/23 1805  LIPASE 23   No results for input(s): "AMMONIA" in the last 168 hours. Coagulation Profile: No results for input(s): "INR", "PROTIME" in the last 168 hours. Cardiac Enzymes: No results for input(s): "CKTOTAL", "CKMB", "CKMBINDEX", "TROPONINI" in the  last 168 hours. BNP (last 3 results) No results for input(s): "PROBNP" in the last 8760 hours. HbA1C: No results for input(s): "HGBA1C" in the last 72 hours. CBG: No results for input(s): "GLUCAP" in the last 168 hours. Lipid Profile: No results for input(s): "CHOL", "HDL", "LDLCALC", "TRIG", "CHOLHDL", "LDLDIRECT" in the last 72 hours. Thyroid Function Tests: No results for input(s): "TSH", "T4TOTAL", "FREET4", "T3FREE", "THYROIDAB" in the last 72 hours. Anemia Panel: No results for input(s): "VITAMINB12", "FOLATE", "FERRITIN", "TIBC", "IRON", "RETICCTPCT" in the last 72 hours. Urine analysis:    Component Value Date/Time   COLORURINE YELLOW 03/02/2023 1805   APPEARANCEUR CLEAR 03/02/2023 1805   LABSPEC 1.010 03/02/2023 1805   PHURINE 5.5 03/02/2023 1805   GLUCOSEU NEGATIVE 03/02/2023 1805   HGBUR SMALL (A) 03/02/2023 1805   HGBUR negative 03/02/2010 0000   BILIRUBINUR NEGATIVE 03/02/2023 1805   BILIRUBINUR negative 06/02/2020 1456   BILIRUBINUR NEGATIVE 04/16/2019 1000   KETONESUR NEGATIVE 03/02/2023 1805   PROTEINUR NEGATIVE 03/02/2023 1805   UROBILINOGEN 0.2 06/02/2020 1456   UROBILINOGEN 0.2 03/02/2010 0000   NITRITE NEGATIVE 03/02/2023 1805   LEUKOCYTESUR MODERATE (A) 03/02/2023 1805    Radiological Exams on Admission: No results found.  EKG: Independently reviewed.   Assessment and Plan: *  Acute cystitis  UCx and BCx pending IVF Repeat CBC in AM to trend WBC H/o ESBL according to Epic flag (Sept of 2023 it looks like?) However, last 2 UCx that I can see included a Rocephin sensitive (non-ESBL) e.coli in Oct 2023 here at [facility] As well as a mostly pan-sensitive K.Pneumo in Feb 2024 at [facility] rocephin for the moment But if pt worsens: would escalate to carbapenem  AKI (acute kidney injury) (HCC) Presumably pre-renal in setting of UTI IVF: 1L bolus in ED and NS at 100 cc/hr Strict intake and output Repeat BMP in AM Getting renal US to r/o obstructive uropathy Hold entresto  Hyperkalemia Mild in setting of AKI Hold entresto Got Lokelma in ED Getting NS Tele monitor Repeat BMP in AM.  Hyponatremia Presumably hypovolemic hyponatremia in setting of AKI IVF: 1L bolus in ED and 100cc/hr NS ongoing. Repeat BMP in AM  DCM (dilated cardiomyopathy) (HCC) H/o reduced EF in past, but looks like nl EF on 2d echo in Oct 2023. Cont BB Hold entresto given AKI.  Essential hypertension Cont BB Hold Entresto      Advance Care Planning:   Code Status: DNR Yellow form on file under ACP  Consults: None  Family Communication: None  Severity of Illness: The appropriate patient status for this patient is INPATIENT. Inpatient status is judged to be reasonable and necessary in order to provide the required intensity of service to ensure the patient's safety. The patient's presenting symptoms, physical exam findings, and initial radiographic and laboratory data in the context of their chronic comorbidities is felt to place them at high risk for further clinical deterioration. Furthermore, it is not anticipated that the patient will be medically stable for discharge from the hospital within 2 midnights of admission.   * I certify that at the point of admission it is my clinical judgment that the patient will require inpatient hospital care spanning beyond 2 midnights from the  point of admission due to high intensity of service, high risk for further deterioration and high frequency of surveillance required.*  Author: Hillary Bow., DO 03/03/2023 12:29 AM  For on call review [email or web address].

## 2023-03-03 NOTE — NC FL2 (Signed)
[place] MEDICAID FL2 LEVEL OF CARE FORM     IDENTIFICATION  Patient Name: Christy Wade Birthdate: [birth year] Sex: female Admission Date (Current Location): 03/02/2023  [facility] and [place] Number:  Producer, television/film/video and Address:  [facility],  [address] [place], [place] [address]      Provider Number: [id number]  Attending Physician Name and Address:  Alwyn Ren, MD  Relative Name and Phone Number:  Fredirick Maudlin [phone]    Current Level of Care: Hospital Recommended Level of Care: Assisted Living Facility Prior Approval Number:    Date Approved/Denied:   PASRR Number: [id number]  Discharge Plan: Other (Comment) (ALF)    Current Diagnoses: Patient Active Problem List   Diagnosis Date Noted   Hyperkalemia 03/03/2023   Acute cystitis 03/02/2023   GERD (gastroesophageal reflux disease) 05/13/2022   AKI (acute kidney injury) (HCC) 05/13/2022   SOB (shortness of breath)    Atrial tachycardia    Acute combined systolic and diastolic heart failure (HCC)    DCM (dilated cardiomyopathy) (HCC)    Hallucinations 06/10/2020   Sepsis (HCC) 06/10/2020   Hypoxia 06/10/2020   Protein-calorie malnutrition (HCC) 06/02/2020   Recurrent UTI 05/27/2020   GAD (generalized anxiety disorder) 05/14/2020   Hypokalemia 05/14/2020   Right shoulder pain 05/14/2020   Loose stools 05/09/2020   Dysphagia 05/09/2020   Hemorrhoids 05/09/2020   Fatigue 05/05/2020   CHF (congestive heart failure) (HCC) 03/25/2020   Respiratory failure (HCC) 03/25/2020   Multiple fractures of ribs, right side, subsequent encounter for fracture with routine healing 03/06/2020   Hyponatremia 03/05/2020   Diarrhea 07/24/2018   Vaginitis and vulvovaginitis 05/22/2016   Well adult exam 10/01/2015   Chronic chest wall pain 09/09/2014   Mitral valve regurgitation 06/21/2012   Essential hypertension 01/03/2008   Osteopenia 03/28/2007   Hypothyroidism 02/16/2007    Orientation  RESPIRATION BLADDER Height & Weight     Self, Time, Situation, Place  Normal Continent Weight: 73 kg Height:  5\' 9"  (175.3 cm)  BEHAVIORAL SYMPTOMS/MOOD NEUROLOGICAL BOWEL NUTRITION STATUS      Continent Diet (Heart Healthy)  AMBULATORY STATUS COMMUNICATION OF NEEDS Skin   Independent Verbally Normal                       Personal Care Assistance Level of Assistance  Bathing, Feeding, Dressing Bathing Assistance: Independent Feeding assistance: Independent Dressing Assistance: Limited assistance     Functional Limitations Info  Sight, Hearing, Speech Sight Info: Adequate Hearing Info: Adequate Speech Info: Impaired (Dentures)    SPECIAL CARE FACTORS FREQUENCY  PT (By licensed PT), OT (By licensed OT)     PT Frequency: 2x/wk OT Frequency: 2x/wk            Contractures Contractures Info: Not present    Additional Factors Info  Code Status, Allergies Code Status Info: DNR Allergies Info: Codeine, Sulfamethoxazole           Current Medications (03/03/2023):  This is the current hospital active medication list Current Facility-Administered Medications  Medication Dose Route Frequency Provider Last Rate Last Admin   0.9 %  sodium chloride infusion   Intravenous Continuous Hillary Bow, DO 100 mL/hr at 03/03/23 0110 New Bag at 03/03/23 0110   acetaminophen (TYLENOL) tablet 650 mg  650 mg Oral Q6H PRN Hillary Bow, DO   650 mg at 03/03/23 0116   Or   acetaminophen (TYLENOL) suppository 650 mg  650 mg Rectal Q6H PRN Julian Reil,  Heywood Iles, DO       cefTRIAXone (ROCEPHIN) 1 g in sodium chloride 0.9 % 100 mL IVPB  1 g Intravenous Q24H Julian Reil, Jared M, DO       enoxaparin (LOVENOX) injection 30 mg  30 mg Subcutaneous Q24H Lyda Perone M, DO   30 mg at 03/03/23 1610   feeding supplement (ENSURE ENLIVE / ENSURE PLUS) liquid 237 mL  237 mL Oral BID BM Alwyn Ren, MD       ondansetron [facility]) tablet 4 mg  4 mg Oral Q6H PRN Hillary Bow, DO       Or    ondansetron [facility]) injection 4 mg  4 mg Intravenous Q6H PRN Hillary Bow, DO       Oral care mouth rinse  15 mL Mouth Rinse PRN Tereasa Coop, MD         Discharge Medications: Please see discharge summary for a list of discharge medications.  Relevant Imaging Results:  Relevant Lab Results:   Additional Information SS#[id number] [phone]  , Olegario Messier, RN

## 2023-03-04 DIAGNOSIS — N390 Urinary tract infection, site not specified: Secondary | ICD-10-CM

## 2023-03-04 LAB — CBC
HCT: 32.6 % — ABNORMAL LOW (ref 36.0–46.0)
Hemoglobin: 10.6 g/dL — ABNORMAL LOW (ref 12.0–15.0)
MCH: 30.5 pg (ref 26.0–34.0)
MCHC: 32.5 g/dL (ref 30.0–36.0)
MCV: 93.9 fL (ref 80.0–100.0)
Platelets: 215 10*3/uL (ref 150–400)
RBC: 3.47 MIL/uL — ABNORMAL LOW (ref 3.87–5.11)
RDW: 14 % (ref 11.5–15.5)
WBC: 5.7 10*3/uL (ref 4.0–10.5)
nRBC: 0 % (ref 0.0–0.2)

## 2023-03-04 LAB — COMPREHENSIVE METABOLIC PANEL
ALT: 11 U/L (ref 0–44)
AST: 15 U/L (ref 15–41)
Albumin: 2.4 g/dL — ABNORMAL LOW (ref 3.5–5.0)
Alkaline Phosphatase: 89 U/L (ref 38–126)
Anion gap: 8 (ref 5–15)
BUN: 26 mg/dL — ABNORMAL HIGH (ref 8–23)
CO2: 18 mmol/L — ABNORMAL LOW (ref 22–32)
Calcium: 7.8 mg/dL — ABNORMAL LOW (ref 8.9–10.3)
Chloride: 108 mmol/L (ref 98–111)
Creatinine, Ser: 1.72 mg/dL — ABNORMAL HIGH (ref 0.44–1.00)
GFR, Estimated: 29 mL/min — ABNORMAL LOW (ref >60)
Glucose, Bld: 75 mg/dL (ref 70–99)
Potassium: 4.1 mmol/L (ref 3.5–5.1)
Sodium: 134 mmol/L — ABNORMAL LOW (ref 135–145)
Total Bilirubin: 0.6 mg/dL (ref 0.3–1.2)
Total Protein: 5.2 g/dL — ABNORMAL LOW (ref 6.5–8.1)

## 2023-03-04 MED ORDER — SACUBITRIL-VALSARTAN 24-26 MG PO TABS: 1.0000 | ORAL_TABLET | Freq: Two times a day (BID) | ORAL | Status: AC

## 2023-03-04 MED ORDER — SPIRONOLACTONE 25 MG PO TABS: 12.5000 mg | ORAL_TABLET | Freq: Every day | ORAL | 1 refills | Status: AC

## 2023-03-04 MED ORDER — ACETAMINOPHEN 325 MG PO TABS: 650.0000 mg | ORAL_TABLET | Freq: Four times a day (QID) | ORAL | Status: AC | PRN

## 2023-03-04 MED ORDER — CHLORHEXIDINE GLUCONATE CLOTH 2 % EX PADS
6.0000 | MEDICATED_PAD | Freq: Every day | CUTANEOUS | Status: DC
  Administered 2023-03-04: 6 via TOPICAL

## 2023-03-04 MED ORDER — FUROSEMIDE 20 MG PO TABS: 20.0000 mg | ORAL_TABLET | Freq: Every day | ORAL | 1 refills | Status: AC

## 2023-03-04 MED ORDER — MUPIROCIN 2 % EX OINT
1.0000 | TOPICAL_OINTMENT | Freq: Two times a day (BID) | CUTANEOUS | Status: DC
  Administered 2023-03-04: 1 via NASAL

## 2023-03-04 MED ORDER — FOSFOMYCIN TROMETHAMINE 3 G PO PACK
3.0000 g | PACK | Freq: Once | ORAL | Status: AC
  Administered 2023-03-04: 3 g via ORAL
  Filled 2023-03-04: qty 3

## 2023-03-04 MED ORDER — MUPIROCIN 2 % EX OINT: 1.0000 | TOPICAL_OINTMENT | Freq: Two times a day (BID) | CUTANEOUS | 0 refills | Status: AC

## 2023-03-04 MED ORDER — LEVOTHYROXINE SODIUM 75 MCG PO TABS: 75.0000 ug | ORAL_TABLET | Freq: Every day | ORAL | Status: DC

## 2023-03-04 MED ORDER — CEPHALEXIN 500 MG PO CAPS: 500.0000 mg | ORAL_CAPSULE | Freq: Three times a day (TID) | ORAL | 0 refills | Status: AC

## 2023-03-04 NOTE — Progress Notes (Signed)
Mobility Specialist - Progress Note   03/04/23 1119  Mobility  Activity Ambulated with assistance in hallway  Level of Assistance Contact guard assist, steadying assist  Assistive Device Four wheel walker  Distance Ambulated (ft) 200 ft  Range of Motion/Exercises Active  Activity Response Tolerated well  Mobility Referral Yes  $Mobility charge 1 Mobility  Mobility Specialist Start Time (ACUTE ONLY) 1105  Mobility Specialist Stop Time (ACUTE ONLY) 1120  Mobility Specialist Time Calculation (min) (ACUTE ONLY) 15 min   Pt received in bed and agreed to mobility. Had no issues throughout session, took one seated rest break in rollator, returned to bed with all needs met.  Marilynne Halsted Mobility Specialist

## 2023-03-04 NOTE — TOC Transition Note (Addendum)
Transition of Care [facility]) - CM/SW Discharge Note   Patient Details  Name: Christy Wade MRN: [id number] Date of Birth: 19-Sep-1935  [facility] [place] [facility] CM/SW Contact:  Lanier Clam, RN Phone Number: 03/04/2023, 12:57 PM   Clinical Narrative:  d/c back to [facility] [employer] [address] [employer] rep Joaquin [place] to accept back. Once all info in place, then await rm#,report tel# prior PTAR. -1p d/c summary faxed;going to rm#[phone],report tel#[phone].PTAR called. No further CM needs.    Final next level of care: Home w Home Health Services Barriers to Discharge: No Barriers Identified   Patient Goals and CMS Choice CMS Medicare.gov Compare Post Acute Care list provided to:: Patient Choice offered to / list presented to : Patient  Discharge Placement     Existing PASRR number confirmed : 03/01/23          Patient chooses bed at: Other - please specify in the comment section below: Patient to be transferred to facility by: PTAR Name of family member notified: Patient Patient and family notified of of transfer: 03/04/23  Discharge Plan and Services Additional resources added to the After Visit Summary for     Discharge Planning Services: CM Consult                                 Social Determinants of Health (SDOH) Interventions SDOH Screenings   Food Insecurity: No Food Insecurity (03/02/2023)  Housing: Low Risk  (03/02/2023)  Transportation Needs: No Transportation Needs (03/02/2023)  Utilities: Not At Risk (03/02/2023)  Alcohol Screen: Low Risk  (01/02/2020)  Depression (PHQ2-9): Low Risk  (01/02/2020)  Financial Resource Strain: Low Risk  (01/02/2020)  Physical Activity: Inactive (01/02/2020)  Social Connections: Moderately Integrated (01/02/2020)  Stress: No Stress Concern Present (01/02/2020)  Tobacco Use: Low Risk  (03/02/2023)     Readmission Risk Interventions    05/11/2022   10:58 AM  Readmission Risk Prevention Plan  Transportation Screening  Complete  PCP or Specialist Appt within 5-7 Days Complete  Home Care Screening Complete  Medication Review (RN CM) Complete

## 2023-03-04 NOTE — Progress Notes (Signed)
Called daughter susan ware and notified her that patient will be discharged back to her facility today and that I would notify her when transportation arrives

## 2023-03-04 NOTE — Discharge Summary (Signed)
Physician Discharge Summary  Christy Wade UYQ:[id number] DOB: 1935/08/23 DOA: 03/02/2023  PCP: No primary care provider on file.  Admit date: 03/02/2023 Discharge date: 03/04/2023  Admitted From: ALF Disposition: ALF Recommendations for Outpatient Follow-up:  Follow up with PCP in 1-2 weeks Please obtain BMP/CBC in one week  Home Health: Yes  Equipment/Devices: None Discharge Condition: Stable  CODE STATUS: DNR  Diet recommendation: Cardiac  Brief/Interim Summary: 87 year old female admitted with nausea generalized weakness and poor appetite for 2 days.  Upon admission her UA was consistent for UTI with a urine culture eventually growing Klebsiella.  She had leukocytosis on admission.  She really did not complain of any abdominal pain back pain dysuria hematuria or frequency.  She was treated with Rocephin for the 2 days that she stayed in the hospital then she was adamant about leaving home today.  She was given a dose of fosfomycin prior to discharge and discharged on Keflex for 12 more doses.    Discharge Diagnoses:  Principal Problem:   Acute cystitis Active Problems:   AKI (acute kidney injury) (HCC)   Hyponatremia   Hyperkalemia   Essential hypertension   DCM (dilated cardiomyopathy) (HCC)   Leukocytosis   Cystitis treated with Rocephin got 2 doses while in hospital along with fosfomycin on the day of discharge.  She will be discharged on Keflex 12 more doses.  Hyperkalemia/hyponatremia improved and resolved on discharge  Dilated cardiomyopathy EF in October 2023 normal.  She has been on Entresto and beta-blocker prior to admission.  Along with Aldactone and Lasix.  Restart Entresto Aldactone and Lasix next week.  03/08/2023.   Estimated body mass index is 23.77 kg/m as calculated from the following:   Height as of this encounter: 5\' 9"  (1.753 m).   Weight as of this encounter: 73 kg.  Discharge Instructions  Discharge Instructions     Diet - low sodium heart  healthy   Complete by: As directed    Increase activity slowly   Complete by: As directed       Allergies as of 03/04/2023       Reactions   Codeine Nausea Only   Sulfamethoxazole Rash, Other (See Comments)   Pt states "seems like I break out"        Medication List     STOP taking these medications    Diclofenac Sodium CR 100 MG 24 hr tablet   metoprolol succinate 50 MG 24 hr tablet Commonly known as: TOPROL-XL   potassium chloride 20 MEQ/15ML (10%) Soln       TAKE these medications    (feeding supplement) PROSource Plus liquid Take 30 mLs by mouth daily.   acetaminophen 500 MG tablet Commonly known as: TYLENOL Take 1,000 mg by mouth every 8 (eight) hours as needed (for pain). What changed: Another medication with the same name was added. Make sure you understand how and when to take each.   acetaminophen 325 MG tablet Commonly known as: TYLENOL Take 2 tablets (650 mg total) by mouth every 6 (six) hours as needed for mild pain (or Fever >/= 101). What changed: You were already taking a medication with the same name, and this prescription was added. Make sure you understand how and when to take each.   aluminum-magnesium hydroxide-simethicone 200-200-20 MG/5ML Susp Commonly known as: MAALOX Take 30 mLs by mouth 4 (four) times daily -  before meals and at bedtime.   Biofreeze 4 % Gel Generic drug: Menthol (Topical Analgesic) Apply 3 oz topically  3 (three) times daily as needed.   Cranberry 450 MG Tabs Take 450 mg by mouth daily.   cycloSPORINE 0.05 % ophthalmic emulsion Commonly known as: RESTASIS Place 1 drop into both eyes 2 (two) times daily.   diltiazem 120 MG 12 hr capsule Commonly known as: CARDIZEM SR Take 120 mg by mouth daily.   DSS 100 MG Caps Take 1 capsule by mouth every 12 (twelve) hours as needed (constipation).   furosemide 20 MG tablet Commonly known as: LASIX Take 1 tablet (20 mg total) by mouth daily. Start taking on: March 08, 2023 What changed: These instructions start on March 08, 2023. If you are unsure what to do until then, ask your doctor or other care provider.   hydrocortisone cream 1 % Apply 1 Application topically 2 (two) times daily as needed for itching.   iron polysaccharides 150 MG capsule Commonly known as: NIFEREX Take 1 capsule (150 mg total) by mouth daily.   levothyroxine 75 MCG tablet Commonly known as: SYNTHROID Take 75 mcg by mouth daily before breakfast.   loperamide 2 MG tablet Commonly known as: IMODIUM A-D Take 2 mg by mouth every 6 (six) hours as needed for diarrhea or loose stools.   melatonin 3 MG Tabs tablet Take 3 mg by mouth at bedtime as needed (sleep).   metoCLOPramide 5 MG tablet Commonly known as: REGLAN Take 5 mg by mouth every 8 (eight) hours as needed for nausea.   mirtazapine 15 MG tablet Commonly known as: REMERON Take 15 mg by mouth at bedtime.   mupirocin ointment 2 % Commonly known as: BACTROBAN Place 1 Application into the nose 2 (two) times daily.   nystatin powder Commonly known as: MYCOSTATIN/NYSTOP Apply 1 Application topically every 8 (eight) hours as needed (for rashes or redness- affected areas).   OcuSoft Lid Scrub Original Pads Place 1 Pad into both eyes 2 (two) times daily.   ondansetron 4 MG tablet Commonly known as: ZOFRAN Take 4 mg by mouth every 8 (eight) hours as needed for nausea or vomiting.   pantoprazole 40 MG tablet Commonly known as: PROTONIX Take 1 tablet (40 mg total) by mouth daily.   Refresh Relieva PF 0.5-1 % Soln Generic drug: Carboxymethylcell-Glycerin PF Place 1 drop into both eyes 4 (four) times daily.   sacubitril-valsartan 24-26 MG Commonly known as: ENTRESTO Take 1 tablet by mouth 2 (two) times daily. Please check BP prior to administration and hold if MAP is less than 65 Start taking on: March 10, 2023 What changed: These instructions start on March 10, 2023. If you are unsure what to do until then, ask  your doctor or other care provider.   spironolactone 25 MG tablet Commonly known as: ALDACTONE Take 0.5 tablets (12.5 mg total) by mouth daily. Start taking on: March 08, 2023 What changed:  how much to take These instructions start on March 08, 2023. If you are unsure what to do until then, ask your doctor or other care provider.   traMADol 50 MG tablet Commonly known as: ULTRAM Take 50 mg by mouth 2 (two) times daily.        Contact information for after-discharge care     Destination     [facility] .   Service: Assisted Living Contact information: [address] [place] [address] [phone]                    Allergies  Allergen Reactions  Codeine Nausea Only   Sulfamethoxazole Rash and Other (See Comments)    Pt states "seems like I break out"    Consultations:NONE   Procedures/Studies: US RENAL  Result Date: 03/03/2023 CLINICAL DATA:  AKI. EXAM: RENAL / URINARY TRACT ULTRASOUND COMPLETE COMPARISON:  06/24/2022. FINDINGS: Right Kidney: Renal measurements: 6.8 x 2.2 x 2.7 cm = volume: 20.9 mL. Increased parenchymal echogenicity. There is a cyst in the right kidney measuring 1.4 x 1.0 x 1.5 cm. No mass or hydronephrosis visualized. Left Kidney: Renal measurements: 9.8 x 4.6 x 3.3 cm = volume: 76.9 mL. Echogenicity within normal limits. No mass. There is mild hydronephrosis. Bladder: Appears normal for degree of bladder distention. Other: None. IMPRESSION: Right renal atrophy with increased parenchymal echogenicity, suggesting medical renal disease. Right renal cyst. Electronically Signed   By: Thornell Sartorius M.D.   On: 03/03/2023 01:13   (Echo, Carotid, EGD, Colonoscopy, ERCP)    Subjective: She is anxious to go home she does not want to stay here another day she says she feels fine no nausea vomiting abdominal pain reported  Discharge Exam: Vitals:   03/03/23 2027 03/04/23 0628  BP: (!) 152/91 (!)  149/71  Pulse: (!) 104 81  Resp: 17 17  Temp: 99.8 F (37.7 C) 98.6 F (37 C)  SpO2: 99% 99%   Vitals:   03/03/23 0121 03/03/23 1044 03/03/23 2027 03/04/23 0628  BP: (!) 151/88 (!) 140/86 (!) 152/91 (!) 149/71  Pulse: 81 95 (!) 104 81  Resp: 17  17 17   Temp: 98 F (36.7 C) (!) 97.5 F (36.4 C) 99.8 F (37.7 C) 98.6 F (37 C)  TempSrc: Oral Oral Oral Oral  SpO2: 98% 96% 99% 99%  Weight:      Height:        General: Pt is alert, awake, not in acute distress Cardiovascular: RRR, S1/S2 +, no rubs, no gallops Respiratory: CTA bilaterally, no wheezing, no rhonchi Abdominal: Soft, NT, ND, bowel sounds + Extremities: no edema, no cyanosis    The results of significant diagnostics from this hospitalization (including imaging, microbiology, ancillary and laboratory) are listed below for reference.     Microbiology: Recent Results (from the past 240 hour(s))  Urine Culture     Status: Abnormal (Preliminary result)   Collection Time: 03/02/23  6:05 PM   Specimen: Urine, Random  Result Value Ref Range Status   Specimen Description   Final    URINE, RANDOM Performed at [facility], [address] [place], [place] [address]    Special Requests   Final    NONE Reflexed from [id number] Performed at [facility], [address] [place], [place] [address]    Culture (A)  Final    >=100,000 COLONIES/mL KLEBSIELLA PNEUMONIAE SUSCEPTIBILITIES TO FOLLOW Performed at [facility], [address] [place], [place] [address]    Report Status PENDING  Incomplete  Blood culture (routine x 2)     Status: None (Preliminary result)   Collection Time: 03/02/23  7:25 PM   Specimen: BLOOD  Result Value Ref Range Status   Specimen Description   Final    BLOOD LEFT ANTECUBITAL Performed at [facility], [address] [place], [place] [address]    Special Requests   Final    BOTTLES DRAWN AEROBIC AND ANAEROBIC Blood Culture adequate volume Performed  at [facility], [address] [place], [place] [address]    Culture  Final    NO GROWTH 2 DAYS Performed at [facility], [address] [place], [place] [address]    Report Status PENDING  Incomplete  Blood culture (routine x 2)     Status: None (Preliminary result)   Collection Time: 03/02/23 11:03 PM   Specimen: BLOOD LEFT ARM  Result Value Ref Range Status   Specimen Description   Final    BLOOD LEFT ARM Performed at [facility], [address], [place], [place] [address]    Special Requests   Final    BOTTLES DRAWN AEROBIC AND ANAEROBIC Blood Culture results may not be optimal due to an inadequate volume of blood received in culture bottles Performed at [facility], [address], [place], [place] [address]    Culture   Final    NO GROWTH 1 DAY Performed at [facility], [address], [place], [place] [address]    Report Status PENDING  Incomplete  MRSA Next Gen by PCR, Nasal     Status: Abnormal   Collection Time: 03/03/23  1:20 AM   Specimen: Nasal Mucosa; Nasal Swab  Result Value Ref Range Status   MRSA by PCR Next Gen DETECTED (A) NOT DETECTED Final    Comment: (NOTE) The GeneXpert MRSA Assay (FDA approved for NASAL specimens only), is one component of a comprehensive MRSA colonization surveillance program. It is not intended to diagnose MRSA infection nor to guide or monitor treatment for MRSA infections. Test performance is not FDA approved in patients less than 5 years old. Performed at [facility], [address] [place], [place] [address]      Labs: BNP (last 3 results) Recent Labs    05/08/22 1402  BNP 91.9   Basic Metabolic Panel: Recent Labs  Lab 03/02/23 1805 03/03/23 0518 03/04/23 0653  NA 125* 126* 134*  K 5.5* 4.6 4.1  CL 93* 98 108  CO2 18* 20* 18*  GLUCOSE 91 94 75  BUN 43* 41* 26*  CREATININE 2.62* 2.42* 1.72*  CALCIUM 8.1* 7.6* 7.8*   Liver Function  Tests: Recent Labs  Lab 03/02/23 1805 03/04/23 0653  AST 16 15  ALT 11 11  ALKPHOS 120 89  BILITOT 0.7 0.6  PROT 6.6 5.2*  ALBUMIN 3.3* 2.4*   Recent Labs  Lab 03/02/23 1805  LIPASE 23   No results for input(s): "AMMONIA" in the last 168 hours. CBC: Recent Labs  Lab 03/02/23 1805 03/03/23 0518 03/04/23 0653  WBC 19.3* 12.8* 5.7  HGB 11.8* 10.1* 10.6*  HCT 34.9* 30.8* 32.6*  MCV 89.5 90.6 93.9  PLT 299 209 215   Cardiac Enzymes: No results for input(s): "CKTOTAL", "CKMB", "CKMBINDEX", "TROPONINI" in the last 168 hours. BNP: Invalid input(s): "POCBNP" CBG: No results for input(s): "GLUCAP" in the last 168 hours. D-Dimer No results for input(s): "DDIMER" in the last 72 hours. Hgb A1c No results for input(s): "HGBA1C" in the last 72 hours. Lipid Profile No results for input(s): "CHOL", "HDL", "LDLCALC", "TRIG", "CHOLHDL", "LDLDIRECT" in the last 72 hours. Thyroid function studies No results for input(s): "TSH", "T4TOTAL", "T3FREE", "THYROIDAB" in the last 72 hours.  Invalid input(s): "FREET3" Anemia work up No results for input(s): "VITAMINB12", "FOLATE", "FERRITIN", "TIBC", "IRON", "RETICCTPCT" in the last 72 hours. Urinalysis    Component Value Date/Time   COLORURINE YELLOW 03/02/2023 1805   APPEARANCEUR CLEAR 03/02/2023 1805   LABSPEC 1.010 03/02/2023 1805   PHURINE 5.5 03/02/2023 1805   GLUCOSEU  NEGATIVE 03/02/2023 1805   HGBUR SMALL (A) 03/02/2023 1805   HGBUR negative 03/02/2010 0000   BILIRUBINUR NEGATIVE 03/02/2023 1805   BILIRUBINUR negative 06/02/2020 1456   BILIRUBINUR NEGATIVE 04/16/2019 1000   KETONESUR NEGATIVE 03/02/2023 1805   PROTEINUR NEGATIVE 03/02/2023 1805   UROBILINOGEN 0.2 06/02/2020 1456   UROBILINOGEN 0.2 03/02/2010 0000   NITRITE NEGATIVE 03/02/2023 1805   LEUKOCYTESUR MODERATE (A) 03/02/2023 1805   Sepsis Labs Recent Labs  Lab 03/02/23 1805 03/03/23 0518 03/04/23 0653  WBC 19.3* 12.8* 5.7   Microbiology Recent Results  (from the past 240 hour(s))  Urine Culture     Status: Abnormal (Preliminary result)   Collection Time: 03/02/23  6:05 PM   Specimen: Urine, Random  Result Value Ref Range Status   Specimen Description   Final    URINE, RANDOM Performed at [facility], [address] [place], [place] [address]    Special Requests   Final    NONE Reflexed from [id number] Performed at [facility], [address] [place], [place] [address]    Culture (A)  Final    >=100,000 COLONIES/mL KLEBSIELLA PNEUMONIAE SUSCEPTIBILITIES TO FOLLOW Performed at [facility], [address] [place], [place] [address]    Report Status PENDING  Incomplete  Blood culture (routine x 2)     Status: None (Preliminary result)   Collection Time: 03/02/23  7:25 PM   Specimen: BLOOD  Result Value Ref Range Status   Specimen Description   Final    BLOOD LEFT ANTECUBITAL Performed at [facility], [address] [place], [place] [address]    Special Requests   Final    BOTTLES DRAWN AEROBIC AND ANAEROBIC Blood Culture adequate volume Performed at [facility], [address] [place], [place] [address]    Culture   Final    NO GROWTH 2 DAYS Performed at [facility], [address], [place], [place] [address]    Report Status PENDING  Incomplete  Blood culture (routine x 2)     Status: None (Preliminary result)   Collection Time: 03/02/23 11:03 PM   Specimen: BLOOD LEFT ARM  Result Value Ref Range Status   Specimen Description   Final    BLOOD LEFT ARM Performed at [facility], [address], [place], [place] [address]    Special Requests   Final    BOTTLES DRAWN AEROBIC AND ANAEROBIC Blood Culture results may not be optimal due to an inadequate volume of blood received in culture bottles Performed at [facility], [address], [place], [place] [address]    Culture   Final    NO GROWTH 1 DAY Performed at [facility],  [address] [place], [place] [address]    Report Status PENDING  Incomplete  MRSA Next Gen by PCR, Nasal     Status: Abnormal   Collection Time: 03/03/23  1:20 AM   Specimen: Nasal Mucosa; Nasal Swab  Result Value Ref Range Status   MRSA by PCR Next Gen DETECTED (A) NOT DETECTED Final    Comment: (NOTE) The GeneXpert MRSA Assay (FDA approved for NASAL specimens only), is one component of a comprehensive MRSA colonization surveillance program. It is not intended to diagnose MRSA infection nor to guide or monitor treatment for MRSA infections. Test performance is not FDA approved in patients less than 58 years old. Performed at [facility], [address] [place], [place] [address]      Time coordinating discharge: 39  minutes  SIGNED: Alwyn Ren, MD  [facility] 03/04/2023, 12:48 PM

## 2023-03-05 LAB — URINE CULTURE: Culture: >=100000 — AB

## 2023-03-07 ENCOUNTER — Telehealth: Payer: Self-pay

## 2023-03-07 LAB — CULTURE, BLOOD (ROUTINE X 2)
Culture: NO GROWTH
Special Requests: ADEQUATE

## 2023-03-07 NOTE — Transitions of Care (Post Inpatient/ED Visit) (Signed)
   03/07/2023  Name: TORAH MORONE MRN: [id number] DOB: December 31, 1935  Today's TOC FU Call Status: Today's TOC FU Call Status:: Successful TOC FU Call Completed TOC FU Call Complete Date: 03/07/23  Transition Care Management Follow-up Telephone Call Date of Discharge: 03/04/23 Discharge Facility: [facility]) Type of Discharge: Inpatient Admission Primary Inpatient Discharge Diagnosis:: UTI How have you been since you were released from the hospital?: Better Any questions or concerns?: No  Items Reviewed: Did you receive and understand the discharge instructions provided?: Yes Medications obtained,verified, and reconciled?: Yes (Medications Reviewed) Any new allergies since your discharge?: No Dietary orders reviewed?: Yes Do you have support at home?: Yes People in Home: facility resident Name of Support/Comfort Primary Source: [facility] staff  Medications Reviewed Today: Medications Reviewed Today   Medications were not reviewed in this encounter     Home Care and Equipment/Supplies: Were Home Health Services Ordered?: NA Any new equipment or medical supplies ordered?: NA  Functional Questionnaire: Do you need assistance with bathing/showering or dressing?: Yes Do you need assistance with meal preparation?: Yes Do you need assistance with eating?: No Do you have difficulty maintaining continence: Yes Do you need assistance with getting out of bed/getting out of a chair/moving?: No Do you have difficulty managing or taking your medications?: Yes  Follow up appointments reviewed: PCP Follow-up appointment confirmed?: No (resident at [facility] Follow-up appointment confirmed?: NA Do you need transportation to your follow-up appointment?: No Do you understand care options if your condition(s) worsen?: Yes-patient verbalized understanding    SIGNATURE Karena Addison, LPN [facility] Nurse Health Advisor Direct Dial [phone]

## 2023-03-08 LAB — CULTURE, BLOOD (ROUTINE X 2): Culture: NO GROWTH
